# Patient Record
Sex: Male | Born: 1935 | Race: White | Hispanic: No | Smoking: Former smoker
Health system: Southern US, Community
[De-identification: ages and names within clinical notes are randomized; demographics above are authoritative.]

## PROBLEM LIST (undated history)

## (undated) DIAGNOSIS — I471 Supraventricular tachycardia, unspecified: Secondary | ICD-10-CM

## (undated) DIAGNOSIS — T7840XA Allergy, unspecified, initial encounter: Secondary | ICD-10-CM

## (undated) DIAGNOSIS — D510 Vitamin B12 deficiency anemia due to intrinsic factor deficiency: Secondary | ICD-10-CM

## (undated) DIAGNOSIS — C169 Malignant neoplasm of stomach, unspecified: Secondary | ICD-10-CM

## (undated) DIAGNOSIS — H53001 Unspecified amblyopia, right eye: Secondary | ICD-10-CM

## (undated) DIAGNOSIS — M1990 Unspecified osteoarthritis, unspecified site: Secondary | ICD-10-CM

## (undated) DIAGNOSIS — E785 Hyperlipidemia, unspecified: Secondary | ICD-10-CM

## (undated) DIAGNOSIS — Z923 Personal history of irradiation: Secondary | ICD-10-CM

## (undated) DIAGNOSIS — E039 Hypothyroidism, unspecified: Secondary | ICD-10-CM

## (undated) DIAGNOSIS — L309 Dermatitis, unspecified: Secondary | ICD-10-CM

## (undated) HISTORY — DX: Unspecified amblyopia, right eye: H53.001

## (undated) HISTORY — DX: Supraventricular tachycardia: I47.1

## (undated) HISTORY — DX: Supraventricular tachycardia, unspecified: I47.10

## (undated) HISTORY — DX: Hyperlipidemia, unspecified: E78.5

## (undated) HISTORY — DX: Vitamin B12 deficiency anemia due to intrinsic factor deficiency: D51.0

## (undated) HISTORY — DX: Hypothyroidism, unspecified: E03.9

## (undated) HISTORY — DX: Dermatitis, unspecified: L30.9

## (undated) HISTORY — DX: Malignant neoplasm of stomach, unspecified: C16.9

---

## 1979-07-26 HISTORY — PX: HERNIA REPAIR: SHX51

## 1999-12-27 ENCOUNTER — Ambulatory Visit (HOSPITAL_COMMUNITY): Admission: RE | Admit: 1999-12-27 | Discharge: 1999-12-27 | Payer: Self-pay | Admitting: Family Medicine

## 1999-12-27 ENCOUNTER — Encounter: Payer: Self-pay | Admitting: Family Medicine

## 2000-07-17 ENCOUNTER — Ambulatory Visit (HOSPITAL_COMMUNITY): Admission: RE | Admit: 2000-07-17 | Discharge: 2000-07-17 | Payer: Self-pay | Admitting: Gastroenterology

## 2000-07-17 ENCOUNTER — Encounter (INDEPENDENT_AMBULATORY_CARE_PROVIDER_SITE_OTHER): Payer: Self-pay | Admitting: Specialist

## 2002-08-21 ENCOUNTER — Observation Stay (HOSPITAL_COMMUNITY): Admission: RE | Admit: 2002-08-21 | Discharge: 2002-08-22 | Payer: Self-pay

## 2005-06-23 ENCOUNTER — Ambulatory Visit: Payer: Self-pay | Admitting: Hematology & Oncology

## 2005-10-17 ENCOUNTER — Ambulatory Visit: Payer: Self-pay | Admitting: Hematology & Oncology

## 2005-11-24 HISTORY — PX: LAPAROSCOPIC TRANSEXTRAPERITONEAL INGUINAL HERNIA REPAIR: SUR801

## 2006-10-22 ENCOUNTER — Ambulatory Visit (HOSPITAL_COMMUNITY): Admission: RE | Admit: 2006-10-22 | Discharge: 2006-10-22 | Payer: Self-pay | Admitting: Surgery

## 2009-11-24 HISTORY — PX: LAPAROSCOPIC CHOLECYSTECTOMY W/ CHOLANGIOGRAPHY: SUR757

## 2010-04-28 ENCOUNTER — Inpatient Hospital Stay (HOSPITAL_COMMUNITY): Admission: EM | Admit: 2010-04-28 | Discharge: 2010-04-30 | Payer: Self-pay | Admitting: Emergency Medicine

## 2010-04-29 ENCOUNTER — Encounter (INDEPENDENT_AMBULATORY_CARE_PROVIDER_SITE_OTHER): Payer: Self-pay

## 2011-02-10 LAB — COMPREHENSIVE METABOLIC PANEL
ALT: 100 U/L — ABNORMAL HIGH (ref 0–53)
ALT: 119 U/L — ABNORMAL HIGH (ref 0–53)
ALT: 68 U/L — ABNORMAL HIGH (ref 0–53)
AST: 121 U/L — ABNORMAL HIGH (ref 0–37)
AST: 86 U/L — ABNORMAL HIGH (ref 0–37)
AST: 88 U/L — ABNORMAL HIGH (ref 0–37)
Alkaline Phosphatase: 51 U/L (ref 39–117)
Alkaline Phosphatase: 52 U/L (ref 39–117)
Alkaline Phosphatase: 71 U/L (ref 39–117)
BUN: 11 mg/dL (ref 6–23)
CO2: 24 mEq/L (ref 19–32)
CO2: 25 mEq/L (ref 19–32)
Calcium: 8 mg/dL — ABNORMAL LOW (ref 8.4–10.5)
Chloride: 102 mEq/L (ref 96–112)
Creatinine, Ser: 1.04 mg/dL (ref 0.4–1.5)
GFR calc non Af Amer: >60 mL/min (ref >60)
GFR calc non Af Amer: >60 mL/min (ref >60)
Total Bilirubin: 0.9 mg/dL (ref 0.3–1.2)
Total Bilirubin: 0.9 mg/dL (ref 0.3–1.2)
Total Bilirubin: 1.6 mg/dL — ABNORMAL HIGH (ref 0.3–1.2)
Total Protein: 5.7 g/dL — ABNORMAL LOW (ref 6.0–8.3)
Total Protein: 6.9 g/dL (ref 6.0–8.3)

## 2011-02-10 LAB — CBC
HCT: 29.8 % — ABNORMAL LOW (ref 39.0–52.0)
HCT: 30.9 % — ABNORMAL LOW (ref 39.0–52.0)
Hemoglobin: 10.5 g/dL — ABNORMAL LOW (ref 13.0–17.0)
MCHC: 33.2 g/dL (ref 30.0–36.0)
MCV: 92.9 fL (ref 78.0–100.0)
MCV: 94.1 fL (ref 78.0–100.0)
Platelets: 116 10*3/uL — ABNORMAL LOW (ref 150–400)
Platelets: 125 10*3/uL — ABNORMAL LOW (ref 150–400)
Platelets: 173 10*3/uL (ref 150–400)
RBC: 3.3 MIL/uL — ABNORMAL LOW (ref 4.22–5.81)
RDW: 13.6 % (ref 11.5–15.5)
WBC: 7.3 10*3/uL (ref 4.0–10.5)

## 2011-02-10 LAB — POCT I-STAT, CHEM 8
BUN: 17 mg/dL (ref 6–23)
Calcium, Ion: 1.11 mmol/L — ABNORMAL LOW (ref 1.12–1.32)
Glucose, Bld: 140 mg/dL — ABNORMAL HIGH (ref 70–99)
Hemoglobin: 13.3 g/dL (ref 13.0–17.0)
Potassium: 3.6 mEq/L (ref 3.5–5.1)

## 2011-02-10 LAB — URINALYSIS, ROUTINE W REFLEX MICROSCOPIC
Glucose, UA: NEGATIVE mg/dL
Ketones, ur: NEGATIVE mg/dL
Protein, ur: NEGATIVE mg/dL
pH: 8.5 — ABNORMAL HIGH (ref 5.0–8.0)

## 2011-02-10 LAB — DIFFERENTIAL
Basophils Relative: 0 % (ref 0–1)
Eosinophils Absolute: 0.1 10*3/uL (ref 0.0–0.7)
Lymphocytes Relative: 7 % — ABNORMAL LOW (ref 12–46)
Lymphs Abs: 0.8 10*3/uL (ref 0.7–4.0)
Monocytes Relative: 6 % (ref 3–12)

## 2011-02-10 LAB — POCT CARDIAC MARKERS
CKMB, poc: 1.6 ng/mL (ref 1.0–8.0)
Myoglobin, poc: 88.2 ng/mL (ref 12–200)
Troponin i, poc: <0.05 ng/mL (ref 0.00–0.09)

## 2011-04-11 NOTE — Op Note (Signed)
NAMEKIMO, BANCROFT              ACCOUNT NO.:  [id number]   MEDICAL RECORD NO.:  [id number]          PATIENT TYPE:  AMB   LOCATION:  DAY                          FACILITY:  [facility]   PHYSICIAN:  Ardeth Sportsman, MD     DATE OF BIRTH:  May 12, 1930   DATE OF PROCEDURE:  10/22/2006  DATE OF DISCHARGE:                               OPERATIVE REPORT   PRIMARY CARE PHYSICIAN:  Foye Deer, MD   SURGEON:  Ardeth Sportsman, MD   ASSISTANT:  Kendrick Ranch, MD   DIAGNOSIS:  Bilateral inguinal hernias.   POSTOPERATIVE DIAGNOSIS:  Recurrent bilateral inguinal hernias,  indirect.   PROCEDURE PERFORMED:  Laparoscopic bilateral inguinal hernia repair with  mesh.   ANESTHESIA:  1. General anesthesia.  2. Bilateral ilioinguinal and genitofemoral nerve blocks and field      blocks around all port sites.   SPECIMENS:  None.   DRAINS:  None.   ESTIMATED BLOOD LOSS:  Less than 5 mL.   COMPLICATIONS:  None apparent.   INDICATIONS:  Ms. Luster Landsberg is a 77 year old gentleman who has had inguinal  hernia repairs in the past and has evidence of bilateral inguinal  hernias.  The anatomy and embryology of abdominal formation was  explained and pathophysiology of inguinal canal herniation was  explained.  Recommendation was made for inguinal hernia repair.  Options  were discussed and laparoscopic preperitoneal technique was recommended.  Risks of stroke, heart attack, deep venous thrombosis, pulmonary  embolism and death were discussed.  Risks such as bleeding, hematoma,  need for transfusion, wound infection, abscess, urinary retention  requiring catheterization, testicular or vascular injury resulting in  testicular atrophy or loss, hernia recurrence and other risks were  discussed.  Questions were answered and he wished to proceed.   OPERATIVE FINDINGS:  He had a large right indirect inguinal hernia and  evidence of a small recurrent left indirect inguinal hernia.   DESCRIPTION OF PROCEDURE:   Informed consent was confirmed.  The patient  had received preoperative antibiotics and had sequential compressive  devices applied just prior to induction.  He underwent general  anesthesia without difficulty.  He was positioned with both arms tucked.  He had a catheterization done of his bladder and evacuation, since he  had not voided recently; it was an in-and-out cath.  The patient then  was clipped, prepped and draped in a sterile fashion.   Entry was gained into the preperitoneal placed through an infraumbilical  curvilinear incision.  A nick was made in the anterior rectus fascia  just to the right and left of the linea alba.  The rectus abdominis  muscles were elevated bilaterally and the peritoneum was freed off the  anterior abdominal wall along the linea alba using scissors.  A 10-mm  Hasson port was passed behind the left rectus abdominis muscle.  Capno-  preperitoneum was induced to 15 mmHg.  Camera dissection was done to  help free the peritoneum off the anterior abdominal wall such that 5-mm  ports in the right, mid abdomen and the left mid abdomen.   The patient had some  dense peritoneal adhesions on both sides of the  anterior abdominal wall.  Some sharp dissection was done, but ultimately  I was able to free the peritoneum off the anterior abdominal wall in the  right and left lower quadrants.  A small nick was made in the right mid  abdominal peritoneum and this was closed using a 4-0 Vicryl stitch in a  pursestring fashion.  A similar nick was made in the hernia sac on the  left side and this was also closed using a 4-0 Vicryl stitch.   Attention was turned towards the right side.  The peritoneum could  easily be followed and the cord structures going up into a dilated  internal inguinal ring.  The hernia sac was freed off the cord  structures and reduced down into the inguinal canal.  The peritoneum was  peeled off the cord as posteroinferiorly as possible.  A  window was made  between the posterolateral bladder and the posterior pelvic brim near  the area of the obturator foramina.  The peritoneum was freed off  laterally as well.   Dissection was carried down in a mirror-image-type fashion on the left  side.  The adhesions were more dense in the inguinal hernia sac.  The  indirect hernia was smaller.  There was evidence of an old stitch that  was brought down where the prior ligation had been done after reducing  some of the sac in.   A 6 x 6 Parietex mesh was cut in a half-skull shape and placed such that  a medial inferior flap was tucked in the true pelvis between the  posterior pelvic wall and the posterior bladder.  The mesh laid well.  The mesh was tucked posteroinferiorly to the peritoneal reflection.  The  mesh laid well laterally and superiorly such that there were at least 3  inches of coverage done around each internal inguinal ring.  The lead  points to the hernia sacs were grasped bilaterally and elevated cephalad  as capno-preperitoneum was released.  The infraumbilical fascial defect  was closed using 0 Vicryl stitches.  Skin was closed using 4-0 Monocryl  stitches.  A sterile dressing was applied.  The patient was extubated  and sent to the recovery room in stable condition.      Ardeth Sportsman, MD  Electronically Signed     SCG/MEDQ  D:  10/22/2006  T:  10/23/2006  Job:  [id number]

## 2011-04-11 NOTE — Procedures (Signed)
[facility]  Patient:    Kyle Armstrong, Kyle Armstrong                     MRN: [id number] Proc. Date: 07/17/00 Adm. Date:  [id number] Disc. Date: [id number] Attending:  Nelda Marseille CC:         Dellis Anes. Idell Pickles, M.D.                           Procedure Report  PROCEDURE:  Colonoscopy with biopsy.  INDICATIONS:  Polyp on flexible sigmoidoscopy.  INFORMED CONSENT:  Consent was signed after risk, benefits, methods and options were thoroughly discussed long ago in the past and as well before any premedication given.  MEDICINES USED:  Demerol 40 mg, Versed 5 mg.  DESCRIPTION OF PROCEDURE:  Rectal inspection for is pertinent for external hemorrhoids.  Digital examination was negative.  The video colonoscope was inserted.  At the rectosigmoid junction polyp probably seen on sigmoscopy was seen.  Further augmentation was obtained.  We did wash the polyp because it seemed to possibly vegetable matter and did seem to move like it was on a stalk.  We went ahead and advanced colon scope to the cecum which did require abdominal pressure but no position changes.  The cecum was identified by the appendicial orifice and the ileocecal valve.  The prep was adequate.  He did have a fair amount of liquid stool that required lots of washing and suctioning, but on slow withdrawal through the colon, the cecum, ascending, transverse, and descending were normal.  On withdrawal back to the rectum no additional abnormalities were seen.  We continually had to wash and suction. The scope was withdrawal back to the polyp in question, and when we advanced the snare and accidentally bumped into the polyp, it seemed to be dislodged from the mucosa.  There was no blood and no obvious stalk.  We went ahead and suctioned the polypoid into the scope and put it in the pathology container. It floated, which is different than most polyps.  We reinserted the scope. There was no obvious heme margin  or any problem with the wall of the colon. This did look out of the body like vegetable matter.  We elected not to send it to pathology based on not wanting the expense.  The rest of the rectum and sigmoid was reevaluated one more time.  Two tiny hyperplastic-appearing polyps, one in the rectum, one in the distal sigmoid, and were each hot biopsied and put in the same container.  Once back in the rectum, the scope was retroflexed revealing some internal hemorrhoids.  After the air was suctioned and the scope removed we called Dr. Idell Pickles to confirm the appearance and the location, which he agreed upon and we went ahead and took a picture of it and will allow Dr. Idell Pickles to see it, if this is compatible with what he saw and no further work-up.  The patient tolerated the procedure well and no obvious immediate complications.  ENDOSCOPIC DIAGNOSIS: 1. Internal and external hemorrhoids. 2. Two tiny rectosigmoid hyperplastic-appearing polyps, status post hot    biopsy. 3. Questionable polyp versus vegetable matter as we believed in the proximal    rectum.  Discussed with Dr. Idell Pickles.  We will await him to evaluate the    pictures. 4. Otherwise within normal limits to the cecum.  PLAN:  Await pathology but consideration of rechecking his colon in 5-10  years.  Yearly rectals and guaiacs per Dr. Idell Pickles, and as stated above, will await for Dr. Idell Pickles to check the picture and confirm that this is what he was seeing on the flexible sigmoidoscopy and I will be happy to see back sooner or p.r.n. DD:  07/17/00 TD:  07/20/00 Job: [id number]

## 2011-04-11 NOTE — Discharge Summary (Signed)
   NAME:  Kyle Armstrong, Kyle Armstrong                        ACCOUNT NO.:  [id number]   MEDICAL RECORD NO.:  [id number]                   PATIENT TYPE:  INP   LOCATION:  0455                                 FACILITY[facility]  [facility]   PHYSICIAN:  Sherin Quarry, MD                   DATE OF BIRTH:  1930/11/07   DATE OF ADMISSION:  08/21/2002  DATE OF DISCHARGE:  08/22/2002                                 DISCHARGE SUMMARY   REASON FOR ADMISSION:  Otho Najjar. Kyle Armstrong is a gentleman who was admitted for  observation on August 21, 2002, after having presented to the [facility] with a 3-day history of fever associated with redness  of his left leg.   His past history is remarkable for coronary artery disease, sarcoidosis, and  hypothyroidism.   PHYSICAL EXAMINATION:  EXTREMITIES:  Physical exam at the time of admission  is recorded by Dr. Soyla Dryer, and was remarkable for the examination of the  extremities.  These revealed 2+ peripheral pulses.  The patient was noted to  have cellulitic changes in the left big toe, extending up to the dorsum of  the foot and the mid leg with lymphangitic spread at the mid aspect of the  inner thigh.   Dr. Soyla Dryer started the patient on Ancef 1 g q.8h.  This produced a dramatic  improvement over the subsequent 24-hour period.  The cellulitis appeared to  have virtually resolved at the time I saw the patient on Monday.  I  therefore felt that the patient was an appropriate candidate for oral  antibiotic therapy.  On August 22, 2002, the patient was discharged.   DISCHARGE DIAGNOSES:  1. Cellulitis of the left leg, improving.  2. Coronary artery disease.  3. Sarcoidosis.  4. Hypothyroidism.   DISCHARGE MEDICATIONS:  1. Zocor 40 mg q.d.  2. Levoxyl 50 mcg q.d.  3. Lanoxin 0.25 mg q.d.  4. Aspirin 1 q.d.  5. Nasacort AQ 1 puff each nostril q.d.  6. Vitamin B12 once monthly.  7.     Multiple vitamin.  8. Augmentin 875 mg b.i.d. with food to  take for 7 additional days.   FOLLOW UP:  I instructed the patient to follow up with Dr. Idell Pickles on  Thursday or Friday.                                               Sherin Quarry, MD    SY/MEDQ  D:  08/22/2002  T:  08/22/2002  Job:  [id number]   cc:   Raynelle Dick, M.D.  [address]  [place]  [place] [address]  Fax: [phone]

## 2012-06-01 ENCOUNTER — Other Ambulatory Visit: Payer: Self-pay | Admitting: Dermatology

## 2012-06-09 ENCOUNTER — Emergency Department (HOSPITAL_COMMUNITY): Payer: Medicare Other

## 2012-06-09 ENCOUNTER — Emergency Department (HOSPITAL_COMMUNITY)
Admission: EM | Admit: 2012-06-09 | Discharge: 2012-06-09 | Disposition: A | Discharge status: Home / Self Care | Payer: Medicare Other | Attending: Emergency Medicine | Admitting: Emergency Medicine

## 2012-06-09 ENCOUNTER — Encounter (HOSPITAL_COMMUNITY): Payer: Self-pay | Admitting: Emergency Medicine

## 2012-06-09 DIAGNOSIS — Z8739 Personal history of other diseases of the musculoskeletal system and connective tissue: Secondary | ICD-10-CM | POA: Insufficient documentation

## 2012-06-09 DIAGNOSIS — Z7982 Long term (current) use of aspirin: Secondary | ICD-10-CM | POA: Insufficient documentation

## 2012-06-09 DIAGNOSIS — I959 Hypotension, unspecified: Secondary | ICD-10-CM | POA: Insufficient documentation

## 2012-06-09 DIAGNOSIS — I471 Supraventricular tachycardia, unspecified: Secondary | ICD-10-CM | POA: Insufficient documentation

## 2012-06-09 DIAGNOSIS — Z79899 Other long term (current) drug therapy: Secondary | ICD-10-CM | POA: Insufficient documentation

## 2012-06-09 HISTORY — DX: Unspecified osteoarthritis, unspecified site: M19.90

## 2012-06-09 LAB — BASIC METABOLIC PANEL
Calcium: 9.2 mg/dL (ref 8.4–10.5)
GFR calc Af Amer: 90 mL/min — ABNORMAL LOW (ref >90)
GFR calc non Af Amer: 78 mL/min — ABNORMAL LOW (ref >90)
Potassium: 4.3 mEq/L (ref 3.5–5.1)
Sodium: 134 mEq/L — ABNORMAL LOW (ref 135–145)

## 2012-06-09 LAB — CBC
MCHC: 33.6 g/dL (ref 30.0–36.0)
Platelets: 158 10*3/uL (ref 150–400)
RDW: 12.9 % (ref 11.5–15.5)

## 2012-06-09 MED ORDER — SODIUM CHLORIDE 0.9 % IV BOLUS (SEPSIS)
1000.0000 mL | Freq: Once | INTRAVENOUS | Status: AC
  Administered 2012-06-09: 1000 mL via INTRAVENOUS

## 2012-06-09 NOTE — ED Provider Notes (Signed)
History     CSN: [id number]  Arrival date & time 06/09/12  1313   First MD Initiated Contact with Patient 06/09/12 1330      Chief Complaint  Patient presents with  . Hypotension     The history is provided by the patient.   the patient was brought to the emergency department from his primary care physician's office where he is found to be in supraventricular tachycardia with a heart rate in the 160s.  Patient has a history of PSVT and is currently on metoprolol as prescribed by his cardiologist.  He reports in the last 8 months he has had 4 episodes where his heart begins to raise his blood pressure drops.  This morning he took 1-1/2 of his metoprolol as an attempt to try and bring his heart rate down without improvement in his symptoms.  At home his heart rate was in the 160s when he took his metoprolol.  He reports lightheadedness without syncope.  He denies chest pain or chest tightness.  He denies jaw pain shoulder pain or any other anginal symptom.  His had no shortness of breath.  He is otherwise compliant with all of his medications.  He was given nothing on arrival to the emergency department he is back into normal sinus rhythm at this time.  Patient is without complaints would like to go home.  His primary care physician call the cardiology office the cardiology office recommended that the patient be transported to the emergency department  Past Medical History  Diagnosis Date  . Arthritis     Past Surgical History  Procedure Date  . Hernia repair     History reviewed. No pertinent family history.  History  Substance Use Topics  . Smoking status: Never Smoker   . Smokeless tobacco: Not on file  . Alcohol Use: No      Review of Systems  All other systems reviewed and are negative.    Allergies  Review of patient's allergies indicates no known allergies.  Home Medications   Current Outpatient Rx  Name Route Sig Dispense Refill  . ASPIRIN 325 MG PO TBEC Oral  Take 325 mg by mouth every evening.    Marland Kitchen CLOBETASOL PROPIONATE 0.05 % EX OINT Topical Apply 1 application topically 2 (two) times daily. To affected areas on hands    . DIGOXIN 0.25 MG PO TABS Oral Take 250 mcg by mouth daily.    Marland Kitchen FERROUS FUMARATE 325 (106 FE) MG PO TABS Oral Take 1 tablet by mouth 2 (two) times daily.    . OMEGA-3 FATTY ACIDS 1000 MG PO CAPS Oral Take 2 g by mouth daily with lunch.    . IBUPROFEN 200 MG PO TABS Oral Take 200 mg by mouth every 6 (six) hours as needed. For pain    . KETOCONAZOLE 2 % EX CREA Topical Apply 1 application topically daily.    Marland Kitchen LEVOTHYROXINE SODIUM 50 MCG PO TABS Oral Take 50 mcg by mouth every morning.    Marland Kitchen METOPROLOL TARTRATE 25 MG PO TABS Oral Take 25 mg by mouth 2 (two) times daily.    . ADULT MULTIVITAMIN W/MINERALS CH Oral Take 1 tablet by mouth daily.    Marland Kitchen MUPIROCIN 2 % EX OINT Topical Apply 1 application topically 2 (two) times daily. For scratches    . OMEPRAZOLE 20 MG PO CPDR Oral Take 20 mg by mouth daily.    Marland Kitchen RANITIDINE HCL 150 MG PO CAPS Oral Take 150 mg by  mouth 2 (two) times daily.    Marland Kitchen SIMVASTATIN 40 MG PO TABS Oral Take 40 mg by mouth every evening.    . TRIAMCINOLONE ACETONIDE 55 MCG/ACT NA INHA Nasal Place 2 sprays into the nose daily.    . TRIAMCINOLONE ACETONIDE 0.1 % EX CREA Topical Apply 1 application topically 2 (two) times daily. To affected areas on hands    . VITAMIN B-12 100 MCG PO TABS Oral Take 50 mcg by mouth daily.      BP 108/78  Pulse 60  Temp 97.9 F (36.6 C) (Oral)  Resp 20  Ht 5\' 11"  (1.803 m)  Wt 188 lb (85.276 kg)  BMI 26.22 kg/m2  SpO2 98%  Physical Exam  Nursing note and vitals reviewed. Constitutional: He is oriented to person, place, and time. He appears well-developed and well-nourished.  HENT:  Head: Normocephalic and atraumatic.  Eyes: EOM are normal.  Neck: Normal range of motion.  Cardiovascular: Normal rate, regular rhythm, normal heart sounds and intact distal pulses.     Pulmonary/Chest: Effort normal and breath sounds normal. No respiratory distress.  Abdominal: Soft. He exhibits no distension. There is no tenderness.  Musculoskeletal: Normal range of motion.  Neurological: He is alert and oriented to person, place, and time.  Skin: Skin is warm and dry.  Psychiatric: He has a normal mood and affect. Judgment normal.    ED Course  Procedures (including critical care time)   Date: 06/09/2012  Rate: 61  Rhythm: normal sinus rhythm  QRS Axis: normal  Intervals: normal  ST/T Wave abnormalities: normal  Conduction Disutrbances: none  Narrative Interpretation:   Old EKG Reviewed: No significant changes noted     Labs Reviewed  CBC - Abnormal; Notable for the following:    RBC 3.79 (*)     Hemoglobin 11.6 (*)     HCT 34.5 (*)     All other components within normal limits  BASIC METABOLIC PANEL - Abnormal; Notable for the following:    Sodium 134 (*)     Glucose, Bld 116 (*)     GFR calc non Af Amer 78 (*)     GFR calc Af Amer 90 (*)     All other components within normal limits  TROPONIN I   Dg Chest 2 View  06/09/2012  *RADIOLOGY REPORT*  Clinical Data: Hypotension.  CHEST - 2 VIEW  Comparison: 04/28/2010  Findings:  There is hyperinflation of the lungs compatible with COPD.  Chronic interstitial prominence and scarring throughout the lungs.  Heart is normal size.  No effusions or acute bony abnormality.  IMPRESSION: COPD/chronic changes.  No active disease.  Original Report Authenticated By: Cyndie Chime, M.D.    I personally reviewed the imaging tests through PACS system  I reviewed available ER/hospitalization records thought the EMR    1. PSVT (paroxysmal supraventricular tachycardia)       MDM  The patient back in normal sinus rhythm at this time.  His blood pressure is normal.  He seems to be compliant with all his medications.  This seems to be true PSVT.  I discussed his case with his cardiologist Dr. Isabel Caprice who will  followup the patient in the office.  He is also asking the patient be referred to [facility] cardiology for evaluation as he believes the patient may benefit from an ablation and believes consultation by the EP team        Lyanne Co, MD 06/09/12 1527

## 2012-06-09 NOTE — ED Notes (Signed)
Patient's pastor at bedside

## 2012-06-09 NOTE — ED Notes (Signed)
Patient is poor historian.  Patient claims doesn't know his medications.  Thinks he is allergic to one cholesterol medication, but doesn't know which one.  Patient very pleasant.   Patient advises he took 1 1/2 metoprolol 25 mg po this morning to try and bring down his heart rate - which was 160's at home this morning.

## 2012-06-09 NOTE — ED Notes (Signed)
Patient ambulated to restroom independently.  Patient tolerated well.

## 2012-06-09 NOTE — ED Notes (Signed)
Per EMS, patient went to [facility] earlier today and was in a junctional/tachy rhythm with wide QRS complexes, no P wave showing.  EMS advised his BP at [facility] was 80/60.  Patient claims that he took 1 1/2 metoprolol 25 mg po this morning to try and bring his HR down.  Patient claims his HR this morning was 161 at home.   Patient's MD called EMS and had patient transported here.

## 2012-06-20 ENCOUNTER — Other Ambulatory Visit: Payer: Self-pay

## 2012-06-20 ENCOUNTER — Emergency Department (HOSPITAL_COMMUNITY): Payer: Medicare Other

## 2012-06-20 ENCOUNTER — Emergency Department (HOSPITAL_COMMUNITY)
Admission: EM | Admit: 2012-06-20 | Discharge: 2012-06-20 | Disposition: A | Discharge status: Home / Self Care | Payer: Medicare Other | Attending: Emergency Medicine | Admitting: Emergency Medicine

## 2012-06-20 ENCOUNTER — Encounter (HOSPITAL_COMMUNITY): Payer: Self-pay | Admitting: *Deleted

## 2012-06-20 DIAGNOSIS — R0602 Shortness of breath: Secondary | ICD-10-CM | POA: Insufficient documentation

## 2012-06-20 DIAGNOSIS — I471 Supraventricular tachycardia: Secondary | ICD-10-CM

## 2012-06-20 DIAGNOSIS — I517 Cardiomegaly: Secondary | ICD-10-CM | POA: Insufficient documentation

## 2012-06-20 DIAGNOSIS — I498 Other specified cardiac arrhythmias: Secondary | ICD-10-CM | POA: Insufficient documentation

## 2012-06-20 LAB — POCT I-STAT, CHEM 8
HCT: 32 % — ABNORMAL LOW (ref 39.0–52.0)
Hemoglobin: 10.9 g/dL — ABNORMAL LOW (ref 13.0–17.0)
Potassium: 3.7 mEq/L (ref 3.5–5.1)
Sodium: 135 mEq/L (ref 135–145)
TCO2: 22 mmol/L (ref 0–100)

## 2012-06-20 LAB — POCT I-STAT TROPONIN I

## 2012-06-20 MED ORDER — ADENOSINE 6 MG/2ML IV SOLN
INTRAVENOUS | Status: AC
  Administered 2012-06-20: 6 mg via INTRAVENOUS
  Filled 2012-06-20: qty 6

## 2012-06-20 MED ORDER — ADENOSINE 6 MG/2ML IV SOLN
12.0000 mg | Freq: Once | INTRAVENOUS | Status: AC
  Administered 2012-06-20: 6 mg via INTRAVENOUS

## 2012-06-20 NOTE — ED Notes (Signed)
Pt placed on zoll pads, EDP and RES at bedside, second RN at bedside to help with adenisone. 6mg  of adenisone given rapid IVP at 1811, pt converted from HR of 160 to 70s. Pt remained alert and oriented. BP cycling. Pt reports very mild discomfort to chest denies sob nausea or diaphoresis.

## 2012-06-20 NOTE — ED Provider Notes (Signed)
History     CSN: [id number]  Arrival date & time 06/20/12  1740   First MD Initiated Contact with Patient 06/20/12 1759      Chief Complaint  Patient presents with  . Tachycardia    (Consider location/radiation/quality/duration/timing/severity/associated sxs/prior treatment) Patient is a 78 y.o. male presenting with palpitations. The history is provided by the patient.  Palpitations  This is a recurrent problem. The current episode started 1 to 2 hours ago. The problem occurs constantly. The problem has not changed since onset.Associated with: nothing. On average, each episode lasts 2 minutes. Associated symptoms include shortness of breath. Pertinent negatives include no fever, no numbness, no chest pain, no abdominal pain, no nausea, no vomiting, no headaches, no back pain, no weakness and no cough. He has tried nothing for the symptoms. The treatment provided no relief. His past medical history is significant for heart disease.    Past Medical History  Diagnosis Date  . Arthritis   . Irregular heart beat     Past Surgical History  Procedure Date  . Hernia repair     History reviewed. No pertinent family history.  History  Substance Use Topics  . Smoking status: Never Smoker   . Smokeless tobacco: Not on file  . Alcohol Use: No      Review of Systems  Constitutional: Negative for fever, activity change, appetite change and fatigue.  HENT: Negative for congestion, sore throat, facial swelling, rhinorrhea, trouble swallowing, neck pain, neck stiffness, voice change and sinus pressure.   Eyes: Negative.   Respiratory: Positive for shortness of breath. Negative for cough, choking, chest tightness and wheezing.   Cardiovascular: Positive for palpitations. Negative for chest pain.  Gastrointestinal: Negative for nausea, vomiting and abdominal pain.  Genitourinary: Negative for dysuria, urgency, frequency, hematuria, flank pain and difficulty urinating.  Musculoskeletal:  Negative for back pain and gait problem.  Skin: Negative for rash and wound.  Neurological: Negative for facial asymmetry, weakness, numbness and headaches.  Psychiatric/Behavioral: Negative for behavioral problems, confusion and agitation. The patient is not nervous/anxious and is not hyperactive.   All other systems reviewed and are negative.    Allergies  Review of patient's allergies indicates no known allergies.  Home Medications   Current Outpatient Rx  Name Route Sig Dispense Refill  . ASPIRIN 325 MG PO TBEC Oral Take 325 mg by mouth every evening.    Marland Kitchen CLOBETASOL PROPIONATE 0.05 % EX OINT Topical Apply 1 application topically 2 (two) times daily. To affected areas on hands    . DIGOXIN 0.25 MG PO TABS Oral Take 250 mcg by mouth daily.    Marland Kitchen FERROUS FUMARATE 325 (106 FE) MG PO TABS Oral Take 1 tablet by mouth 2 (two) times daily.    . OMEGA-3 FATTY ACIDS 1000 MG PO CAPS Oral Take 2 g by mouth daily with lunch.    . IBUPROFEN 200 MG PO TABS Oral Take 200 mg by mouth every 6 (six) hours as needed. For pain    . KETOCONAZOLE 2 % EX CREA Topical Apply 1 application topically daily.    Marland Kitchen LEVOTHYROXINE SODIUM 50 MCG PO TABS Oral Take 50 mcg by mouth every morning.    Marland Kitchen METOPROLOL TARTRATE 25 MG PO TABS Oral Take 25 mg by mouth 2 (two) times daily.    . ADULT MULTIVITAMIN W/MINERALS CH Oral Take 1 tablet by mouth daily.    Marland Kitchen MUPIROCIN 2 % EX OINT Topical Apply 1 application topically 2 (two) times daily. For  scratches    . OMEPRAZOLE 20 MG PO CPDR Oral Take 20 mg by mouth daily.    Marland Kitchen RANITIDINE HCL 150 MG PO CAPS Oral Take 150 mg by mouth 2 (two) times daily.    Marland Kitchen SIMVASTATIN 40 MG PO TABS Oral Take 40 mg by mouth every evening.    . TRIAMCINOLONE ACETONIDE 55 MCG/ACT NA INHA Nasal Place 2 sprays into the nose daily.    . TRIAMCINOLONE ACETONIDE 0.1 % EX CREA Topical Apply 1 application topically 2 (two) times daily. To affected areas on hands    . VITAMIN B-12 100 MCG PO TABS Oral Take  50 mcg by mouth daily.      BP 103/64  Pulse 64  Temp 97.6 F (36.4 C) (Oral)  Resp 21  Ht 5\' 11"  (1.803 m)  Wt 185 lb (83.915 kg)  BMI 25.80 kg/m2  SpO2 99%  Physical Exam  Nursing note and vitals reviewed. Constitutional: He is oriented to person, place, and time. He appears well-developed and well-nourished. No distress.  HENT:  Head: Normocephalic and atraumatic.  Right Ear: External ear normal.  Left Ear: External ear normal.  Mouth/Throat: No oropharyngeal exudate.  Eyes: Conjunctivae and EOM are normal. Pupils are equal, round, and reactive to light. Right eye exhibits no discharge. Left eye exhibits no discharge.  Neck: Normal range of motion. Neck supple. No JVD present. No tracheal deviation present. No thyromegaly present.  Cardiovascular: Regular rhythm, normal heart sounds and intact distal pulses.  Exam reveals no gallop and no friction rub.   No murmur heard.      Tachycardic   Pulmonary/Chest: Effort normal and breath sounds normal. No respiratory distress. He has no wheezes. He exhibits no tenderness.  Abdominal: Soft. Bowel sounds are normal. He exhibits no distension. There is no tenderness. There is no rebound and no guarding.  Musculoskeletal: Normal range of motion. He exhibits no edema and no tenderness.  Lymphadenopathy:    He has no cervical adenopathy.  Neurological: He is alert and oriented to person, place, and time. No cranial nerve deficit.  Skin: Skin is warm and dry. No rash noted. He is not diaphoretic. No pallor.  Psychiatric: He has a normal mood and affect. His behavior is normal.    ED Course  Procedures (including critical care time)  Labs Reviewed  POCT I-STAT, CHEM 8 - Abnormal; Notable for the following:    Glucose, Bld 125 (*)     Hemoglobin 10.9 (*)     HCT 32.0 (*)     All other components within normal limits  POCT I-STAT TROPONIN I   Dg Chest 2 View  06/20/2012  *RADIOLOGY REPORT*  Clinical Data: Tachycardia.  Nonsmoker.   History of irregular heart beat.  CHEST - 2 VIEW  Comparison: 06/09/2012 and earlier  Findings: The heart size is mildly enlarged.  Coarse parenchymal markings are again noted, slightly increased since prior study. Findings are consistent with interstitial edema or infection.  Mild mid thoracic degenerative changes are seen.  IMPRESSION: Cardiomegaly and mild interstitial edema or infection. Chronic lung changes.  Original Report Authenticated By: Patterson Hammersmith, M.D.     No diagnosis found.    MDM  78 year old male patient past medical history superventricular tachycardia COPD coronary artery disease and diabetes presents with supraventricular tachycardia with heart rate in the 160s. Patient was given 6 mg of adenosine and spontaneously return to a normal sinus rhythm. Patient remained a symptomatically no chest pain and a normal rhythm  for 3 hours in the ED. Patient asymptomatic with normal laboratory workup and chest x-ray. Patient has a cardiologist he'll followup with this week. Patient encouraged to return to the emergency department for shortness of breath chest pain or further palpitations      DG Chest 2 View (Final result)   Result time:06/20/12 1924    Final result by Rad Results In Interface (06/20/12 19:24:45)    Narrative:   *RADIOLOGY REPORT*  Clinical Data: Tachycardia. Nonsmoker. History of irregular heart beat.  CHEST - 2 VIEW  Comparison: 06/09/2012 and earlier  Findings: The heart size is mildly enlarged. Coarse parenchymal markings are again noted, slightly increased since prior study. Findings are consistent with interstitial edema or infection. Mild mid thoracic degenerative changes are seen.  IMPRESSION: Cardiomegaly and mild interstitial edema or infection. Chronic lung changes.  Original Report Authenticated By: Patterson Hammersmith, M.D.    Date: 06/20/2012  Rate: 160  Rhythm: supraventricular tachycardia (SVT)  QRS Axis: normal  Intervals:  normal  ST/T Wave abnormalities: normal  Conduction Disutrbances:none  Narrative Interpretation:   Old EKG Reviewed: changes noted  After adenosine   Date: 06/20/2012  Rate: 62  Rhythm: normal sinus rhythm  QRS Axis: normal  Intervals: normal  ST/T Wave abnormalities: normal  Conduction Disutrbances:none  Narrative Interpretation:   Old EKG Reviewed: changes noted   Case discussed with Dr. Macario Carls, MD 06/20/12 [phone]

## 2012-06-20 NOTE — ED Provider Notes (Signed)
Pt seen on arrival with resident Appears to have SVT He responded to adenosine ( I was present entire time) Will follow closely, stable at this time  Joya Gaskins, MD 06/20/12 1820

## 2012-06-21 ENCOUNTER — Emergency Department (HOSPITAL_COMMUNITY)
Admission: EM | Admit: 2012-06-21 | Discharge: 2012-06-21 | Disposition: A | Discharge status: Home / Self Care | Payer: Medicare Other | Attending: Emergency Medicine | Admitting: Emergency Medicine

## 2012-06-21 ENCOUNTER — Encounter (HOSPITAL_COMMUNITY): Payer: Self-pay | Admitting: *Deleted

## 2012-06-21 DIAGNOSIS — R002 Palpitations: Secondary | ICD-10-CM | POA: Insufficient documentation

## 2012-06-21 DIAGNOSIS — I498 Other specified cardiac arrhythmias: Secondary | ICD-10-CM | POA: Insufficient documentation

## 2012-06-21 DIAGNOSIS — Z79899 Other long term (current) drug therapy: Secondary | ICD-10-CM | POA: Insufficient documentation

## 2012-06-21 DIAGNOSIS — I471 Supraventricular tachycardia: Secondary | ICD-10-CM

## 2012-06-21 MED ORDER — ADENOSINE 6 MG/2ML IV SOLN
INTRAVENOUS | Status: AC
  Administered 2012-06-21: 6 mg
  Filled 2012-06-21: qty 4

## 2012-06-21 NOTE — ED Provider Notes (Signed)
I have personally seen and examined the patient.  I have discussed the plan of care with the resident.  I have reviewed the documentation on PMH/FH/Soc. History.  I have reviewed the documentation of the resident and agree.  I have reviewed and agree with the ECG interpretation(s) documented by the resident.   Joya Gaskins, MD 06/21/12 [phone]

## 2012-06-21 NOTE — ED Notes (Signed)
Pt taken off monitor as MD said pt would be d/c soon.

## 2012-06-21 NOTE — ED Provider Notes (Signed)
History     CSN: [id number]  Arrival date & time 06/21/12  1631   First MD Initiated Contact with Patient 06/21/12 1647      Chief Complaint  Patient presents with  . Tachycardia    (Consider location/radiation/quality/duration/timing/severity/associated sxs/prior treatment) HPI Comments: Pt is an 78 year old man who has had recurrent episodes of SVT.  He was treated for it yesterday with adenosine.  He also had an episode on July 17,2013.  He has been seen for this with Dr. Eldridge Dace, cardiologist.   Patient is a 78 y.o. male presenting with palpitations.  Palpitations  This is a recurrent problem. The problem occurs constantly (Started about 10:30 this AM.  He rested at home, hoping it would resolve.). The problem has not changed since onset.On average, each episode lasts 7 hours. Pertinent negatives include no diaphoresis, no fever, no chest pain, no near-syncope, no weakness and no shortness of breath. He has tried nothing for the symptoms. Past medical history comments: He went to Dr. Hoyle Barr office, was found to have tachycardia, and was sent to [facility] ED for evaluation..    Past Medical History  Diagnosis Date  . Arthritis   . Irregular heart beat     Past Surgical History  Procedure Date  . Hernia repair     History reviewed. No pertinent family history.  History  Substance Use Topics  . Smoking status: Never Smoker   . Smokeless tobacco: Not on file  . Alcohol Use: No      Review of Systems  Constitutional: Negative for fever, chills and diaphoresis.  HENT: Negative.   Eyes: Negative.   Respiratory: Negative.  Negative for shortness of breath.   Cardiovascular: Positive for palpitations. Negative for chest pain and near-syncope.  Gastrointestinal: Negative.   Genitourinary: Negative.   Musculoskeletal: Negative.   Skin: Negative.   Neurological: Negative.  Negative for weakness.  Psychiatric/Behavioral: Negative.     Allergies  Review of  patient's allergies indicates no known allergies.  Home Medications   Current Outpatient Rx  Name Route Sig Dispense Refill  . ASPIRIN 325 MG PO TBEC Oral Take 325 mg by mouth every evening.    Marland Kitchen CLOBETASOL PROPIONATE 0.05 % EX OINT Topical Apply 1 application topically 2 (two) times daily. To affected areas on hands    . DIGOXIN 0.25 MG PO TABS Oral Take 250 mcg by mouth daily.    Marland Kitchen FERROUS FUMARATE 325 (106 FE) MG PO TABS Oral Take 1 tablet by mouth 2 (two) times daily.    . OMEGA-3 FATTY ACIDS 1000 MG PO CAPS Oral Take 2 g by mouth daily with lunch.    . IBUPROFEN 200 MG PO TABS Oral Take 200 mg by mouth every 6 (six) hours as needed. For pain    . KETOCONAZOLE 2 % EX CREA Topical Apply 1 application topically daily.    Marland Kitchen LEVOTHYROXINE SODIUM 50 MCG PO TABS Oral Take 50 mcg by mouth every morning.    Marland Kitchen METOPROLOL TARTRATE 25 MG PO TABS Oral Take 25 mg by mouth 2 (two) times daily.    . ADULT MULTIVITAMIN W/MINERALS CH Oral Take 1 tablet by mouth daily.    Marland Kitchen MUPIROCIN 2 % EX OINT Topical Apply 1 application topically 2 (two) times daily. For scratches    . OMEPRAZOLE 20 MG PO CPDR Oral Take 20 mg by mouth daily.    Marland Kitchen RANITIDINE HCL 150 MG PO CAPS Oral Take 150 mg by mouth 2 (two)  times daily.    Marland Kitchen SIMVASTATIN 40 MG PO TABS Oral Take 40 mg by mouth every evening.    . TRIAMCINOLONE ACETONIDE 55 MCG/ACT NA INHA Nasal Place 2 sprays into the nose daily.    . TRIAMCINOLONE ACETONIDE 0.1 % EX CREA Topical Apply 1 application topically 2 (two) times daily. To affected areas on hands    . VITAMIN B-12 100 MCG PO TABS Oral Take 50 mcg by mouth daily.      BP 100/74  Pulse 164  Resp 20  SpO2 97%  Physical Exam  Nursing note and vitals reviewed. Constitutional: He is oriented to person, place, and time. He appears well-developed and well-nourished. No distress.       He has a resting tachycardia of 160.  HENT:  Head: Normocephalic and atraumatic.  Right Ear: External ear normal.  Left  Ear: External ear normal.  Mouth/Throat: Oropharynx is clear and moist.  Eyes: Conjunctivae and EOM are normal. Pupils are equal, round, and reactive to light.  Neck: Normal range of motion. Neck supple.  Cardiovascular: Regular rhythm and normal heart sounds.        Tachycardia of 160.  Pulmonary/Chest: Effort normal and breath sounds normal. He has no wheezes. He has no rales.  Abdominal: Soft. Bowel sounds are normal.  Musculoskeletal: Normal range of motion. He exhibits no edema and no tenderness.  Neurological: He is alert and oriented to person, place, and time.       NO sensory or motor deficit.  Skin: Skin is warm and dry.  Psychiatric: He has a normal mood and affect. His behavior is normal.    ED Course  Procedures (including critical care time)  Labs Reviewed - No data to display Dg Chest 2 View  06/20/2012  *RADIOLOGY REPORT*  Clinical Data: Tachycardia.  Nonsmoker.  History of irregular heart beat.  CHEST - 2 VIEW  Comparison: 06/09/2012 and earlier  Findings: The heart size is mildly enlarged.  Coarse parenchymal markings are again noted, slightly increased since prior study. Findings are consistent with interstitial edema or infection.  Mild mid thoracic degenerative changes are seen.  IMPRESSION: Cardiomegaly and mild interstitial edema or infection. Chronic lung changes.  Original Report Authenticated By: Patterson Hammersmith, M.D.    Date: 06/21/2012  Rate: 160  Rhythm: supraventricular tachycardia (SVT)  QRS Axis: left  Intervals: QT prolonged  ST/T Wave abnormalities: ST depressions anteriorly  Conduction Disutrbances:left anterior fascicular block  Narrative Interpretation: Abnormal EKG  Old EKG Reviewed: unchanged--seen here with SVT yesterday.  5:15 PM  Date: 06/21/2012  Rate: 69  Rhythm: normal sinus rhythm  QRS Axis: left  Intervals: normal  ST/T Wave abnormalities: normal  Conduction Disutrbances:none  Narrative Interpretation: Normal EKG  Old EKG  Reviewed: changes noted--has been converted from SVT.  5:18 PM Pt given Adenosine 6 mg IV with conversion to NSR.  Case discussed with Dr. Eldridge Dace --> increase metoprolol to 50 mg bid, and his office will set pt up to be seen by an electrophysiologist to consider ablation.  6:14 PM No further tachycardia.  Released.   1. Supraventricular tachycardia            Carleene Cooper III, MD 06/21/12 1815

## 2012-06-21 NOTE — ED Notes (Signed)
Pt brought immediately back to room and placed on zoll and ekg and vitals performed. Dr Ignacia Palma made aware. Pt is aware of poc. IV established. Nursing staff for section at bedside.

## 2012-06-21 NOTE — ED Notes (Addendum)
Pt with hx of svt. Pt with HR at 160s. Pt was at work and started to feel bad, states he is starting to recognize the feeling. Pt went to doctors office and drove himself here. Pt is alert and oriented x 3. Pt was here for same yesterday and given adenosine.

## 2012-06-21 NOTE — ED Notes (Signed)
AIDET performed. MD with pt.

## 2012-06-23 ENCOUNTER — Ambulatory Visit (INDEPENDENT_AMBULATORY_CARE_PROVIDER_SITE_OTHER): Payer: Medicare Other | Admitting: Internal Medicine

## 2012-06-23 ENCOUNTER — Encounter: Payer: Self-pay | Admitting: Internal Medicine

## 2012-06-23 ENCOUNTER — Encounter: Payer: Self-pay | Admitting: *Deleted

## 2012-06-23 VITALS — BP 118/62 | HR 79 | Resp 18 | Ht 67.0 in | Wt 188.8 lb

## 2012-06-23 DIAGNOSIS — I498 Other specified cardiac arrhythmias: Secondary | ICD-10-CM

## 2012-06-23 DIAGNOSIS — I471 Supraventricular tachycardia: Secondary | ICD-10-CM

## 2012-06-23 NOTE — Patient Instructions (Addendum)

## 2012-06-23 NOTE — Assessment & Plan Note (Addendum)
I have reviewed the patient's recent ER ekgs which reveal short RP narrow complex SVT.  His tachycardia terminates with adenosine.  He has failed medical therapy with metoprolol and digoxin.  Given his recent frequent episodes, I think that catheter ablation is a prudent option. Therapeutic strategies for supraventricular tachycardia including medicine and ablation were discussed in detail with the patient today. Risk, benefits, and alternatives to EP study and radiofrequency ablation were also discussed in detail today. These risks include but are not limited to stroke, bleeding, vascular damage, tamponade, perforation, damage to the heart and other structures, AV block requiring pacemaker, worsening renal function, and death.  Given evidence of AV nodal disease by ekg, his risks for AV block are likely increased.  The patient understands these risk and wishes to proceed.  We will therefore proceed with catheter ablation at the next available time.

## 2012-06-23 NOTE — Progress Notes (Signed)
 Primary Care Physician: ROSS,CHARLES ALAN, MD Referring Physician:  Dr Varanasi   Kyle Armstrong is a 78 y.o. male with a h/o recently diagnosed adenosine sensitive short RP tachycardia who presents today for EP consultation.  He reports abrupt onset/ offset tachypalpitations for about 3 years.  He is unaware of triggers/ precipitants of these episodes.  He reports a feeling of fullness in his throat as well as mild dizziness with episodes.  He has failed medical therapy with metoprolol and digoxin.  Episodes have recently increased in frequency and duration.  He has require visits to [facility] 7/17, 7/28, and 7/29 during which he received adenosine for tachycardia termination.  He has not previously tried vagal maneuvers.  Today, he denies symptoms of palpitations, chest pain, shortness of breath, orthopnea, PND, lower extremity edema, dizziness, presyncope, syncope, or neurologic sequela. The patient is tolerating medications without difficulties and is otherwise without complaint today.   Past Medical History  Diagnosis Date  . Arthritis   . SVT (supraventricular tachycardia)     adenosine sensitive short RP tachycardia  . Hypothyroidism   . Hyperlipidemia   . Pernicious anemia    Past Surgical History  Procedure Date  . Hernia repair     Current Outpatient Prescriptions  Medication Sig Dispense Refill  . aspirin 325 MG EC tablet Take 325 mg by mouth every evening.      . clobetasol ointment (TEMOVATE) 0.05 % Apply 1 application topically 2 (two) times daily. To affected areas on hands      . digoxin (LANOXIN) 0.25 MG tablet Take 250 mcg by mouth daily.      . ferrous fumarate (HEMOCYTE - 106 MG FE) 325 (106 FE) MG TABS Take 1 tablet by mouth 2 (two) times daily.      . fesoterodine (TOVIAZ) 4 MG TB24 Take 4 mg by mouth daily.      . fish oil-omega-3 fatty acids 1000 MG capsule Take 2 g by mouth daily with lunch.      . ibuprofen (ADVIL,MOTRIN) 200 MG tablet Take 200 mg by  mouth every 6 (six) hours as needed. For pain      . ketoconazole (NIZORAL) 2 % cream Apply 1 application topically daily.      . levothyroxine (SYNTHROID, LEVOTHROID) 50 MCG tablet Take 50 mcg by mouth every morning.      . metoprolol tartrate (LOPRESSOR) 25 MG tablet Take 25 mg by mouth 2 (two) times daily.      . Multiple Vitamin (MULTIVITAMIN WITH MINERALS) TABS Take 1 tablet by mouth daily.      . mupirocin ointment (BACTROBAN) 2 % Apply 1 application topically 2 (two) times daily. For scratches      . ranitidine (ZANTAC) 150 MG capsule Take 150 mg by mouth 2 (two) times daily.      . simvastatin (ZOCOR) 40 MG tablet Take 40 mg by mouth every evening.      . triamcinolone (NASACORT) 55 MCG/ACT nasal inhaler Place 2 sprays into the nose daily.      . triamcinolone cream (KENALOG) 0.1 % Apply 1 application topically 2 (two) times daily. To affected areas on hands      . vitamin B-12 (CYANOCOBALAMIN) 100 MCG tablet Take 50 mcg by mouth daily.        No Known Allergies  History   Social History  . Marital Status: Married    Spouse Name: N/A    Number of Children: N/A  . Years of   Education: N/A   Occupational History  . Not on file.   Social History Main Topics  . Smoking status: Never Smoker   . Smokeless tobacco: Not on file  . Alcohol Use: No  . Drug Use: No  . Sexually Active: No   Other Topics Concern  . Not on file   Social History Narrative   Widowed,  retired    Family History  Problem Relation Age of Onset  . Hypertension      ROS- All systems are reviewed and negative except as per the HPI above  Physical Exam: Filed Vitals:   06/23/12 1636  BP: 118/62  Pulse: 79  Resp: 18  Height: 5' 7" (1.702 m)  Weight: 188 lb 12.8 oz (85.639 kg)  SpO2: 92%    GEN- The patient is well appearing, alert and oriented x 3 today.   Head- normocephalic, atraumatic Eyes-  Sclera clear, conjunctiva pink Ears- hearing intact Oropharynx- clear Neck- supple, no  JVP Lymph- no cervical lymphadenopathy Lungs- Clear to ausculation bilaterally, normal work of breathing Heart- Regular rate and rhythm, no murmurs, rubs or gallops, PMI not laterally displaced GI- soft, NT, ND, + BS Extremities- no clubbing, cyanosis, or edema MS- no significant deformity or atrophy Skin- no rash or lesion Psych- euthymic mood, full affect Neuro- strength and sensation are intact  EKG today reveals sinus rhythm 51 bpm, PR 180, RBBB, LAHB, QRS 116 msec  Assessment and Plan:  

## 2012-07-01 ENCOUNTER — Encounter (HOSPITAL_COMMUNITY): Payer: Self-pay | Admitting: Pharmacy Technician

## 2012-07-09 ENCOUNTER — Other Ambulatory Visit (INDEPENDENT_AMBULATORY_CARE_PROVIDER_SITE_OTHER): Payer: Medicare Other

## 2012-07-09 DIAGNOSIS — I471 Supraventricular tachycardia: Secondary | ICD-10-CM

## 2012-07-09 DIAGNOSIS — I498 Other specified cardiac arrhythmias: Secondary | ICD-10-CM

## 2012-07-09 LAB — CBC WITH DIFFERENTIAL/PLATELET
Basophils Absolute: 0 10*3/uL (ref 0.0–0.1)
Eosinophils Relative: 6.4 % — ABNORMAL HIGH (ref 0.0–5.0)
MCV: 93.4 fl (ref 78.0–100.0)
Monocytes Absolute: 0.4 10*3/uL (ref 0.1–1.0)
Neutrophils Relative %: 71.1 % (ref 43.0–77.0)
Platelets: 181 10*3/uL (ref 150.0–400.0)
RDW: 13.9 % (ref 11.5–14.6)
WBC: 7 10*3/uL (ref 4.5–10.5)

## 2012-07-09 LAB — BASIC METABOLIC PANEL
Calcium: 9.4 mg/dL (ref 8.4–10.5)
GFR: 78.16 mL/min (ref >60.00)
Glucose, Bld: 154 mg/dL — ABNORMAL HIGH (ref 70–99)
Potassium: 4.6 mEq/L (ref 3.5–5.1)
Sodium: 137 mEq/L (ref 135–145)

## 2012-07-16 ENCOUNTER — Encounter (HOSPITAL_COMMUNITY): Payer: Self-pay | Admitting: *Deleted

## 2012-07-16 ENCOUNTER — Ambulatory Visit (HOSPITAL_COMMUNITY)
Admission: RE | Admit: 2012-07-16 | Discharge: 2012-07-17 | Disposition: A | Discharge status: Home / Self Care | Payer: Medicare Other | Source: Ambulatory Visit | Attending: Internal Medicine | Admitting: Internal Medicine

## 2012-07-16 ENCOUNTER — Encounter (HOSPITAL_COMMUNITY)
Admission: RE | Disposition: A | Discharge status: Home / Self Care | Payer: Self-pay | Source: Ambulatory Visit | Attending: Internal Medicine

## 2012-07-16 DIAGNOSIS — I471 Supraventricular tachycardia, unspecified: Secondary | ICD-10-CM | POA: Diagnosis present

## 2012-07-16 DIAGNOSIS — D510 Vitamin B12 deficiency anemia due to intrinsic factor deficiency: Secondary | ICD-10-CM | POA: Diagnosis present

## 2012-07-16 DIAGNOSIS — I498 Other specified cardiac arrhythmias: Secondary | ICD-10-CM | POA: Insufficient documentation

## 2012-07-16 DIAGNOSIS — E785 Hyperlipidemia, unspecified: Secondary | ICD-10-CM | POA: Diagnosis present

## 2012-07-16 DIAGNOSIS — Z79899 Other long term (current) drug therapy: Secondary | ICD-10-CM | POA: Insufficient documentation

## 2012-07-16 DIAGNOSIS — Z7982 Long term (current) use of aspirin: Secondary | ICD-10-CM | POA: Insufficient documentation

## 2012-07-16 DIAGNOSIS — E039 Hypothyroidism, unspecified: Secondary | ICD-10-CM | POA: Diagnosis present

## 2012-07-16 DIAGNOSIS — Z7902 Long term (current) use of antithrombotics/antiplatelets: Secondary | ICD-10-CM | POA: Insufficient documentation

## 2012-07-16 DIAGNOSIS — M1990 Unspecified osteoarthritis, unspecified site: Secondary | ICD-10-CM | POA: Diagnosis present

## 2012-07-16 HISTORY — PX: OTHER SURGICAL HISTORY: SHX169

## 2012-07-16 HISTORY — PX: SUPRAVENTRICULAR TACHYCARDIA ABLATION: SHX5492

## 2012-07-16 SURGERY — SUPRAVENTRICULAR TACHYCARDIA ABLATION
Anesthesia: Moderate Sedation | Laterality: Bilateral

## 2012-07-16 MED ORDER — SODIUM CHLORIDE 0.9 % IJ SOLN: 3.0000 mL | INTRAMUSCULAR | Status: DC | PRN

## 2012-07-16 MED ORDER — BUPIVACAINE HCL (PF) 0.25 % IJ SOLN
INTRAMUSCULAR | Status: AC
  Filled 2012-07-16: qty 30

## 2012-07-16 MED ORDER — ADULT MULTIVITAMIN W/MINERALS CH
1.0000 | ORAL_TABLET | Freq: Every day | ORAL | Status: DC
  Administered 2012-07-16 – 2012-07-17 (×2): 1 via ORAL
  Filled 2012-07-16 (×3): qty 1

## 2012-07-16 MED ORDER — HYDROCODONE-ACETAMINOPHEN 5-325 MG PO TABS: 1.0000 | ORAL_TABLET | ORAL | Status: DC | PRN

## 2012-07-16 MED ORDER — MIDAZOLAM HCL 5 MG/5ML IJ SOLN
INTRAMUSCULAR | Status: AC
  Filled 2012-07-16: qty 5

## 2012-07-16 MED ORDER — ACETAMINOPHEN 325 MG PO TABS
650.0000 mg | ORAL_TABLET | ORAL | Status: DC | PRN
  Administered 2012-07-16 – 2012-07-17 (×2): 650 mg via ORAL
  Filled 2012-07-16 (×2): qty 2

## 2012-07-16 MED ORDER — SODIUM CHLORIDE 0.9 % IJ SOLN
3.0000 mL | Freq: Two times a day (BID) | INTRAMUSCULAR | Status: DC
  Administered 2012-07-16 – 2012-07-17 (×2): 3 mL via INTRAVENOUS

## 2012-07-16 MED ORDER — FENTANYL CITRATE 0.05 MG/ML IJ SOLN
INTRAMUSCULAR | Status: AC
  Filled 2012-07-16: qty 2

## 2012-07-16 MED ORDER — SODIUM CHLORIDE 0.9 % IV SOLN: 250.0000 mL | INTRAVENOUS | Status: DC | PRN

## 2012-07-16 MED ORDER — FERROUS FUMARATE 325 (106 FE) MG PO TABS
1.0000 | ORAL_TABLET | Freq: Two times a day (BID) | ORAL | Status: DC
  Administered 2012-07-16 – 2012-07-17 (×2): 106 mg via ORAL
  Filled 2012-07-16 (×3): qty 1

## 2012-07-16 MED ORDER — ONDANSETRON HCL 4 MG/2ML IJ SOLN: 4.0000 mg | Freq: Four times a day (QID) | INTRAMUSCULAR | Status: DC | PRN

## 2012-07-16 MED ORDER — SODIUM CHLORIDE 0.9 % IV SOLN
INTRAVENOUS | Status: DC
  Administered 2012-07-16: 20 mL/h via INTRAVENOUS

## 2012-07-16 MED ORDER — LEVOTHYROXINE SODIUM 50 MCG PO TABS
50.0000 ug | ORAL_TABLET | Freq: Every day | ORAL | Status: DC
  Administered 2012-07-17: 50 ug via ORAL
  Filled 2012-07-16 (×2): qty 1

## 2012-07-16 MED ORDER — HYDROXYUREA 500 MG PO CAPS
ORAL_CAPSULE | ORAL | Status: AC
  Filled 2012-07-16: qty 1

## 2012-07-16 NOTE — Brief Op Note (Signed)
07/16/2012  3:28 PM  PATIENT:  Kyle Armstrong  78 y.o. male  PRE-OPERATIVE DIAGNOSIS:  svt  POST-OPERATIVE DIAGNOSIS:  AVNRT  PROCEDURE:  Procedure(s) (LRB): SUPRAVENTRICULAR TACHYCARDIA ABLATION (Bilateral)  SURGEON:  Surgeon(s) and Role:    * Hillis Range, MD - Primary  PHYSICIAN ASSISTANT:   ASSISTANTS: none   ANESTHESIA:   IV sedation  EBL:     BLOOD ADMINISTERED:none  DRAINS: none   LOCAL MEDICATIONS USED:  LIDOCAINE   SPECIMEN:  No Specimen  DISPOSITION OF SPECIMEN:  N/A  COUNTS:  YES  TOURNIQUET:  * No tourniquets in log *  DICTATION: .Other Dictation: Dictation Number [id number]  PLAN OF CARE: Admit for overnight observation  PATIENT DISPOSITION:  PACU - hemodynamically stable.   Delay start of Pharmacological VTE agent (>24hrs) due to surgical blood loss or risk of bleeding: yes

## 2012-07-16 NOTE — Interval H&P Note (Signed)
History and Physical Interval Note:  07/16/2012 12:42 PM  Kyle Armstrong  has presented today for surgery, with the diagnosis of svt  The various methods of treatment have been discussed with the patient and family. After consideration of risks, benefits and other options for treatment, the patient has consented to  Procedure(s) (LRB): SUPRAVENTRICULAR TACHYCARDIA ABLATION (Bilateral) as a surgical intervention .  The patient's history has been reviewed, patient examined, no change in status, stable for surgery.  I have reviewed the patient's chart and labs.  Questions were answered to the patient's satisfaction.     Hillis Range

## 2012-07-16 NOTE — H&P (View-Only) (Signed)
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 male with a h/o recently diagnosed adenosine sensitive short RP tachycardia who presents today for EP consultation.  He reports abrupt onset/ offset tachypalpitations for about 3 years.  He is unaware of triggers/ precipitants of these episodes.  He reports a feeling of fullness in his throat as well as mild dizziness with episodes.  He has failed medical therapy with metoprolol and digoxin.  Episodes have recently increased in frequency and duration.  He has require visits to [facility] ER 7/17, 7/28, and 7/29 during which he received adenosine for tachycardia termination.  He has not previously tried vagal maneuvers.  Today, he denies symptoms of palpitations, chest pain, shortness of breath, orthopnea, PND, lower extremity edema, dizziness, presyncope, syncope, or neurologic sequela. The patient is tolerating medications without difficulties and is otherwise without complaint today.   Past Medical History  Diagnosis Date  . Arthritis   . SVT (supraventricular tachycardia)     adenosine sensitive short RP tachycardia  . Hypothyroidism   . Hyperlipidemia   . Pernicious anemia    Past Surgical History  Procedure Date  . Hernia repair     Current Outpatient Prescriptions  Medication Sig Dispense Refill  . aspirin 325 MG EC tablet Take 325 mg by mouth every evening.      . clobetasol ointment (TEMOVATE) 0.05 % Apply 1 application topically 2 (two) times daily. To affected areas on hands      . digoxin (LANOXIN) 0.25 MG tablet Take 250 mcg by mouth daily.      . ferrous fumarate (HEMOCYTE - 106 MG FE) 325 (106 FE) MG TABS Take 1 tablet by mouth 2 (two) times daily.      . fesoterodine (TOVIAZ) 4 MG TB24 Take 4 mg by mouth daily.      . fish oil-omega-3 fatty acids 1000 MG capsule Take 2 g by mouth daily with lunch.      . ibuprofen (ADVIL,MOTRIN) 200 MG tablet Take 200 mg by  mouth every 6 (six) hours as needed. For pain      . ketoconazole (NIZORAL) 2 % cream Apply 1 application topically daily.      Marland Kitchen levothyroxine (SYNTHROID, LEVOTHROID) 50 MCG tablet Take 50 mcg by mouth every morning.      . metoprolol tartrate (LOPRESSOR) 25 MG tablet Take 25 mg by mouth 2 (two) times daily.      . Multiple Vitamin (MULTIVITAMIN WITH MINERALS) TABS Take 1 tablet by mouth daily.      . mupirocin ointment (BACTROBAN) 2 % Apply 1 application topically 2 (two) times daily. For scratches      . ranitidine (ZANTAC) 150 MG capsule Take 150 mg by mouth 2 (two) times daily.      . simvastatin (ZOCOR) 40 MG tablet Take 40 mg by mouth every evening.      . triamcinolone (NASACORT) 55 MCG/ACT nasal inhaler Place 2 sprays into the nose daily.      Marland Kitchen triamcinolone cream (KENALOG) 0.1 % Apply 1 application topically 2 (two) times daily. To affected areas on hands      . vitamin B-12 (CYANOCOBALAMIN) 100 MCG tablet Take 50 mcg by mouth daily.        No Known Allergies  History   Social History  . Marital Status: Married    Spouse Name: N/A    Number of Children: N/A  . Years of  Education: N/A   Occupational History  . Not on file.   Social History Main Topics  . Smoking status: Never Smoker   . Smokeless tobacco: Not on file  . Alcohol Use: No  . Drug Use: No  . Sexually Active: No   Other Topics Concern  . Not on file   Social History Narrative   Widowed,  retired    Family History  Problem Relation Age of Onset  . Hypertension      ROS- All systems are reviewed and negative except as per the HPI above  Physical Exam: Filed Vitals:   06/23/12 1636  BP: 118/62  Pulse: 79  Resp: 18  Height: 5\' 7"  (1.702 m)  Weight: 188 lb 12.8 oz (85.639 kg)  SpO2: 92%    GEN- The patient is well appearing, alert and oriented x 3 today.   Head- normocephalic, atraumatic Eyes-  Sclera clear, conjunctiva pink Ears- hearing intact Oropharynx- clear Neck- supple, no  JVP Lymph- no cervical lymphadenopathy Lungs- Clear to ausculation bilaterally, normal work of breathing Heart- Regular rate and rhythm, no murmurs, rubs or gallops, PMI not laterally displaced GI- soft, NT, ND, + BS Extremities- no clubbing, cyanosis, or edema MS- no significant deformity or atrophy Skin- no rash or lesion Psych- euthymic mood, full affect Neuro- strength and sensation are intact  EKG today reveals sinus rhythm 51 bpm, PR 180, RBBB, LAHB, QRS 116 msec  Assessment and Plan:

## 2012-07-16 NOTE — Progress Notes (Signed)
Doing well s/p ablation for AVNRT  I would anticipate discharge in early am.  Stop digoxin. Stop metoprolol  Follow-up with me in 4 weeks

## 2012-07-17 ENCOUNTER — Encounter (HOSPITAL_COMMUNITY): Payer: Self-pay | Admitting: Nurse Practitioner

## 2012-07-17 DIAGNOSIS — E039 Hypothyroidism, unspecified: Secondary | ICD-10-CM | POA: Diagnosis present

## 2012-07-17 DIAGNOSIS — M1990 Unspecified osteoarthritis, unspecified site: Secondary | ICD-10-CM | POA: Diagnosis present

## 2012-07-17 DIAGNOSIS — D510 Vitamin B12 deficiency anemia due to intrinsic factor deficiency: Secondary | ICD-10-CM | POA: Diagnosis present

## 2012-07-17 DIAGNOSIS — I471 Supraventricular tachycardia: Secondary | ICD-10-CM

## 2012-07-17 DIAGNOSIS — E785 Hyperlipidemia, unspecified: Secondary | ICD-10-CM | POA: Diagnosis present

## 2012-07-17 NOTE — Discharge Summary (Signed)
Patient ID: Kyle Armstrong,  MRN: [id number], DOB/AGE: 12-01-1929 78 y.o.  Admit date: 07/16/2012 Discharge date: 07/17/2012  Primary Care Provider: Daisy Floro Primary Cardiologist: J. Allred, MD  Discharge Diagnoses Principal Problem:  *SVT (supraventricular tachycardia)  **s/p RFCA this admission.  Active Problems:  Hyperlipidemia  Pernicious anemia  Hypothyroidism  Arthritis  Allergies Allergies  Allergen Reactions  . Lipitor (Atorvastatin) Other (See Comments)    Leg cramps   Procedures  EP Study and Radiofrequency Catheter Ablation  1. Comprehensive EP study. 2. Coronary sinus pacing and recording. 3. Mapping of supraventricular tachycardia. 4. Ablation of supraventricular tachycardia. 5. Arrhythmia induction with pacing and isoproterenol infusion.  CONCLUSIONS: 1. Sinus rhythm upon presentation. 2. The patient has dual AV nodal physiology with easily inducible and     incessant classic AV nodal reentrant tachycardia. 3. Successful ablation of the slow AV nodal pathway with no inducible     arrhythmias following ablation both on and off of isoproterenol 4. No early apparent complications. _____________  History of Present Illness  78 y/o male with the above problem list.  He was recently seen in clinic by Dr. Johney Frame secondary to increasing frequency and duration of SVT requiring 3 separate ER visits where his SVT was terminated with Adenosine.  It was felt that pt would be a good candidate for SVT ablation and pt was agreeable.  Hospital Course  Pt presented to [facility] EP lab on 8/23.  He underwent EP study and subsequent successful SVT ablation.  He tolerated this procedure well and post-procedure has been ambulating without difficulty.  He will be discharged home today in good condition.  Discharge Vitals Blood pressure 121/76, pulse 63, temperature 98.1 F (36.7 C), temperature source Oral, resp. rate 18, height 5\' 11"  (1.803 m), weight 185 lb  (83.915 kg), SpO2 96.00%.  Filed Weights   07/16/12 1052  Weight: 185 lb (83.915 kg)   Labs  None  Disposition  Pt is being discharged home today in good condition.  Follow-up Plans & Appointments  Follow-up Information    Follow up with Hillis Range, MD in 4 weeks. (we will arrange.)    Contact information:   [address], [address] [place] [address] [phone]        Discharge Medications  Medication List  As of 07/17/2012 12:34 PM   STOP taking these medications         digoxin 0.25 MG tablet      metoprolol 50 MG tablet         TAKE these medications         aspirin 325 MG EC tablet   Take 325 mg by mouth every evening.      clobetasol ointment 0.05 %   Commonly known as: TEMOVATE   Apply 1 application topically 2 (two) times daily. To affected areas on hands      ferrous fumarate 325 (106 FE) MG Tabs   Commonly known as: HEMOCYTE - 106 mg FE   Take 1 tablet by mouth 2 (two) times daily.      fish oil-omega-3 fatty acids 1000 MG capsule   Take 2 g by mouth daily with lunch.      ibuprofen 200 MG tablet   Commonly known as: ADVIL,MOTRIN   Take 200 mg by mouth every 6 (six) hours as needed. For pain      levothyroxine 50 MCG tablet   Commonly known as: SYNTHROID, LEVOTHROID   Take 50 mcg by mouth  every morning.      multivitamin with minerals Tabs   Take 1 tablet by mouth daily.      ranitidine 150 MG capsule   Commonly known as: ZANTAC   Take 150 mg by mouth 2 (two) times daily.      simvastatin 40 MG tablet   Commonly known as: ZOCOR   Take 40 mg by mouth every evening.      Super B Complex/Vitamin C Tabs   Take 1 tablet by mouth daily.      triamcinolone 55 MCG/ACT nasal inhaler   Commonly known as: NASACORT   Place 2 sprays into the nose daily.      triamcinolone cream 0.1 %   Commonly known as: KENALOG   Apply 1 application topically 2 (two) times daily. To affected areas on hands      vitamin B-12 100 MCG  tablet   Commonly known as: CYANOCOBALAMIN   Take 50 mcg by mouth daily.          Outstanding Labs/Studies  None  Duration of Discharge Encounter   Greater than 30 minutes including physician time.  Signed, Nicolasa Ducking NP 07/17/2012, 12:34 PM

## 2012-07-17 NOTE — Progress Notes (Signed)
   Subjective:  Feels very well after ablation of SVT yesterday. Telemetry stable NSR. EKG shows normal PR interval.  Objective:  Vital Signs in the last 24 hours: Temp:  [97.6 F (36.4 C)-98.3 F (36.8 C)] 98.1 F (36.7 C) (08/24 0500) Pulse Rate:  [62-73] 63  (08/24 0500) Resp:  [18-20] 18  (08/24 0500) BP: (115-164)/(58-77) 121/76 mmHg (08/24 0500) SpO2:  [95 %-97 %] 96 % (08/24 0500)  Intake/Output from previous day: 08/23 0701 - 08/24 0700 In: 360 [P.O.:360] Out: 320 [Urine:320] Intake/Output from this shift:       . bupivacaine      . bupivacaine      . fentaNYL      . ferrous fumarate  1 tablet Oral BID  . hydroxyurea      . levothyroxine  50 mcg Oral Q breakfast  . midazolam      . multivitamin with minerals  1 tablet Oral Daily  . sodium chloride  3 mL Intravenous Q12H      . DISCONTD: sodium chloride 20 mL/hr (07/16/12 1101)    Physical Exam: The patient appears to be in no distress.  Head and neck exam reveals that the pupils are equal and reactive.  The extraocular movements are full.  There is no scleral icterus.  Mouth and pharynx are benign.  No lymphadenopathy.  No carotid bruits.  The jugular venous pressure is normal.  Thyroid is not enlarged or tender. IJ looks fine. Chest is clear to percussion and auscultation.  No rales or rhonchi.  Expansion of the chest is symmetrical.  Heart reveals no abnormal lift or heave.  First and second heart sounds are normal.  There is no murmur gallop rub or click.  The abdomen is soft and nontender.  Bowel sounds are normoactive.  There is no hepatosplenomegaly or mass.  There are no abdominal bruits. Groin OKay  Extremities reveal no phlebitis or edema.  Pedal pulses are good.  There is no cyanosis or clubbing.  Neurologic exam is normal strength and no lateralizing weakness.  No sensory deficits.  Integument reveals no rash  Lab Results: No results found for this basename: WBC:2,HGB:2,PLT:2 in the last 72  hours No results found for this basename: NA:2,K:2,CL:2,CO2:2,GLUCOSE:2,BUN:2,CREATININE:2 in the last 72 hours No results found for this basename: TROPONINI:2,CK,MB:2 in the last 72 hours Hepatic Function Panel No results found for this basename: PROT,ALBUMIN,AST,ALT,ALKPHOS,BILITOT,BILIDIR,IBILI in the last 72 hours No results found for this basename: CHOL in the last 72 hours No results found for this basename: PROTIME in the last 72 hours  Imaging: Imaging results have been reviewed  Cardiac Studies:  Assessment/Plan:  Patient Active Hospital Problem List: SVT (supraventricular tachycardia) (06/23/2012)   Assessment: Successful ablation.   Plan: Home today off digoxin and metoprolol.   LOS: 1 day    Cassell Clement 07/17/2012, 10:57 AM

## 2012-07-17 NOTE — Op Note (Signed)
Kyle Armstrong, Kyle Armstrong              ACCOUNT NO.:  [id number]  MEDICAL RECORD NO.:  [id number]  LOCATION:  3W34C                        FACILITY:  [facility]  PHYSICIAN:  Hillis Range, MD       DATE OF BIRTH:  June 28, 1930  DATE OF PROCEDURE:  07/16/2012 DATE OF DISCHARGE:                              OPERATIVE REPORT   SURGEON:  Hillis Range, MD  PREPROCEDURE DIAGNOSIS:  Supraventricular tachycardia.  POSTPROCEDURE DIAGNOSIS:  Atrioventricular nodal reentrant tachycardia.  PROCEDURES: 1. Comprehensive EP study. 2. Coronary sinus pacing and recording. 3. Mapping of supraventricular tachycardia. 4. Ablation of supraventricular tachycardia. 5. Arrhythmia induction with pacing and isoproterenol infusion.  INTRODUCTION:  [name] is a pleasant 78 year old gentleman with recurrent symptomatic supraventricular tachycardia.  He has been documented to have a short RP tachycardia, which terminates with adenosine.  Unfortunately he has had several recent ER admissions due to SVT despite medical therapy with metoprolol and digoxin.  He therefore presents today for EP study and radiofrequency ablation.  DESCRIPTION OF PROCEDURE:  Informed written consent was obtained and the patient was brought to the electrophysiology lab in the fasting state. He was adequately sedated with intravenous Versed and fentanyl as outlined in the nursing report.  The patient's right neck and groin were prepped and draped in the usual sterile fashion by the EP lab staff. Using a percutaneous Seldinger technique, one 6-French hemostasis sheath was placed into the right internal jugular vein.  A 6-French curved Damato catheter was introduced through the right internal jugular vein and advanced into the coronary sinus for recording and pacing from this location.  Two 6-French and one 8-French hemostasis sheaths were placed into the right common femoral vein.  Two 6-French quadripolar Josephson catheters were introduced  through the right common femoral vein and advanced into the His bundle and right ventricular apex positions respectively.  The patient presented to the electrophysiology lab in normal sinus rhythm.  His PR interval measured 168 msec with a QRS duration of 100 msec and a QT interval of 431 msec.  His average RR interval was 981 msec.  His AH interval measured 80 msec with an HV interval of 47 msec.  His surface QRS was of an incomplete right bundle branch and left anterior hemiblock QRS configuration, however, the QRS was only 100 msec.  Ventricular pacing was performed, which revealed midline concentric decremental VA conduction with a VA Wenckebach cycle length of 340 msec with no arrhythmias observed.  Ventricular extra stimulus testing was performed, which revealed midline concentric decremental VA conduction with a retrograde AH jump with no echo beats or tachycardias observed.  The ventricular ERP was 600/250 msec. Ventricular extra stimulus testing was then performed with a basic cycle length of 500 msec, which revealed decremental VA conduction with a single retrograde jump but no echo beats or tachycardias observed.  The ventricular ERP was 500/240 msec.  Rapid atrial pacing was performed, which revealed PR much greater than RR with an AV Wenckebach cycle length of 310 msec.  With rapid atrial pacing at 350 msec, the patient had easily inducible and reproducible tachycardia.  This was a one-to- one tachycardia with the earliest retrograde atrial activation recorded from  the His electrogram.  The VA time measured 20 msec.  Ventricular pacing was attempted during tachycardia, however, tachycardia terminated.  Atrial extra stimulus testing was performed, which revealed an AH jump with tachycardia induced.  The tachycardia was the same tachycardia as previously described and was felt to represent classic AV nodal reentrant tachycardia with antegrade conduction over the slow AV nodal  pathway and retrograde conduction over the fast pathway.  The cycle length of tachycardia was 340 msec.  The tachycardia terminated with an atrial activation thus excluding atrial tachycardia.  This tachycardia was easily induced with 500/320, 500/310, and 500/280 msec. The atrial ERP was 500/250 msec.  As the patient had clear documentation of AV nodal reentrant tachycardia as well as dual AV nodal physiology, I elected to perform slow AV nodal pathway ablation today.  A 7-French Biosense Webster 4 mm Celsius ablation catheter was introduced through the right common femoral vein and advanced into the right atrium. Atrial mapping of Koch's triangle was performed.  This demonstrated a standard triangle.  The ablation catheter was positioned at site 9 in Koch's triangle.  A single radiofrequency application was delivered at 50 watts with a target temperature of 60 degrees for 45 seconds.  During tachycardia, accelerated junctional rhythm was observed with intact VA conduction.  Following ablation, rapid atrial pacing was performed, which revealed no evidence of PR greater than RR.  The AV Wenckebach cycle length was 420 msec.  Atrial extra stimulus testing was performed, which revealed decremental AV conduction with no AH jumps, echo beats, or tachycardias observed.  The AV nodal ERP was 500/340 msec. Ventricular extra stimulus testing was performed, which revealed decremental retrograde VA conduction with no retrograde jumps, echo beats, or tachycardias observed.  The ventricular ERP was 500/220 msec. Isoproterenol was infused at 2 mcg/minute.  During isoproterenol infusion, rapid atrial pacing was performed, which revealed an AV Wenckebach cycle length of 310 msec with no evidence of PR greater than RR and no tachycardias observed.  Atrial extra stimulus testing was performed, which revealed decremental AV conduction with no AH jumps, echo beats, or tachycardias.  The atrial ERP was  500/240 msec. Isoproterenol was decreased to 1 mcg/minute.  Ventricular pacing was performed, which revealed intact VA conduction with a VA Wenckebach cycle length of less than 300 msec.  Ventricular extra stimulus testing was performed, which revealed decremental VA conduction with jumps, echo beats, or tachycardias observed.  Isoproterenol was discontinued and allowed to wash out.  Following ablation, the AH interval measured 78 msec with an HV interval of 51 msec.  The patient was observed for 30 minutes.  Following a 30 minute waiting period, there was no evidence of dual AV nodal physiology and tachycardia could no longer be induced. The procedure was therefore considered completed.  All catheters were removed and the sheaths were aspirated and flushed.  The sheaths were removed and hemostasis was assured.  There were no early apparent complications.  CONCLUSIONS: 1. Sinus rhythm upon presentation. 2. The patient has dual AV nodal physiology with easily inducible and     incessant classic AV nodal reentrant tachycardia. 3. Successful ablation of the slow AV nodal pathway with no inducible     arrhythmias following ablation both on and off of isoproterenol 4. No early apparent complications.     Hillis Range, MD     JA/MEDQ  D:  07/16/2012  T:  07/17/2012  Job:  [id number]

## 2012-08-20 ENCOUNTER — Encounter: Payer: Medicare Other | Admitting: Internal Medicine

## 2012-08-25 ENCOUNTER — Encounter: Payer: Self-pay | Admitting: Internal Medicine

## 2012-08-25 ENCOUNTER — Ambulatory Visit (INDEPENDENT_AMBULATORY_CARE_PROVIDER_SITE_OTHER): Payer: Medicare Other | Admitting: Internal Medicine

## 2012-08-25 VITALS — BP 102/60 | HR 71 | Ht 71.0 in | Wt 188.6 lb

## 2012-08-25 DIAGNOSIS — I498 Other specified cardiac arrhythmias: Secondary | ICD-10-CM

## 2012-08-25 DIAGNOSIS — I471 Supraventricular tachycardia: Secondary | ICD-10-CM

## 2012-08-25 NOTE — Patient Instructions (Addendum)
Your physician recommends that you schedule a follow-up appointment as needed  

## 2012-08-25 NOTE — Progress Notes (Signed)
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  Since his recent SVT ablation, the patient reports doing very well.  He denies procedure related complications.  He has had no further SVT and is pleased with the results of his procedure.  Today, he denies symptoms of palpitations, chest pain, shortness of breath,  lower extremity edema, dizziness, presyncope, or syncope.  The patient is otherwise without complaint today.   Past Medical History  Diagnosis Date  . Arthritis   . SVT (supraventricular tachycardia)     AVNRT s/p ablation by Dr Johney Frame  . Hypothyroidism   . Hyperlipidemia   . Pernicious anemia    Past Surgical History  Procedure Date  . Hernia repair   . Ep study and ablation 07/16/12    slow pathway ablation for AVNRT by DR Issaih Kaus    Current Outpatient Prescriptions  Medication Sig Dispense Refill  . aspirin 325 MG EC tablet Take 325 mg by mouth every evening.      . B Complex-C (SUPER B COMPLEX/VITAMIN C) TABS Take 1 tablet by mouth every other day.       . clobetasol ointment (TEMOVATE) 0.05 % Apply 1 application topically 2 (two) times daily. To affected areas on hands       . Cyanocobalamin (VITAMIN B-12 IJ) Inject as directed every 30 (thirty) days.      . ferrous fumarate (HEMOCYTE - 106 MG FE) 325 (106 FE) MG TABS Take 1 tablet by mouth daily.       . fish oil-omega-3 fatty acids 1000 MG capsule Take 2 g by mouth every other day.       . ibuprofen (ADVIL,MOTRIN) 200 MG tablet Take 200 mg by mouth every 6 (six) hours as needed. For pain       . levothyroxine (SYNTHROID, LEVOTHROID) 50 MCG tablet Take 50 mcg by mouth every morning.      . meclizine (ANTIVERT) 25 MG tablet Take 25 mg by mouth 3 (three) times daily as needed.       . Multiple Vitamin (MULTIVITAMIN WITH MINERALS) TABS Take 1 tablet by mouth every other day.       . ranitidine (ZANTAC) 150 MG  capsule Take 150 mg by mouth as needed.       . simvastatin (ZOCOR) 40 MG tablet Take 40 mg by mouth every evening.      . triamcinolone (NASACORT) 55 MCG/ACT nasal inhaler Place 2 sprays into the nose as needed.       . triamcinolone cream (KENALOG) 0.1 % Apply 1 application topically as needed. To affected areas on hands      . vitamin B-12 (CYANOCOBALAMIN) 100 MCG tablet Take 50 mcg by mouth every other day.         Physical Exam: Filed Vitals:   08/25/12 1129  BP: 102/60  Pulse: 71  Height: 5\' 11"  (1.803 m)  Weight: 188 lb 9.6 oz (85.548 kg)    GEN- The patient is well appearing, alert and oriented x 3 today.   Head- normocephalic, atraumatic Eyes-  Sclera clear, conjunctiva pink Ears- hearing intact Oropharynx- clear Lungs- Clear to ausculation bilaterally, normal work of breathing Heart- Regular rate and rhythm, no murmurs, rubs or gallops, PMI not laterally displaced GI- soft, NT, ND, + BS Extremities- no clubbing, cyanosis, or edema   Assessment and Plan:  1. SVT- doing well s/p ablation No further workup  planned I will see him as needed going forward.

## 2013-08-19 ENCOUNTER — Other Ambulatory Visit: Payer: Self-pay | Admitting: Gastroenterology

## 2013-08-25 ENCOUNTER — Other Ambulatory Visit: Payer: Self-pay | Admitting: Gastroenterology

## 2013-08-25 DIAGNOSIS — R109 Unspecified abdominal pain: Secondary | ICD-10-CM

## 2013-08-30 ENCOUNTER — Ambulatory Visit
Admission: RE | Admit: 2013-08-30 | Discharge: 2013-08-30 | Disposition: A | Discharge status: Home / Self Care | Payer: Medicare Other | Source: Ambulatory Visit | Attending: Gastroenterology | Admitting: Gastroenterology

## 2013-08-30 DIAGNOSIS — R109 Unspecified abdominal pain: Secondary | ICD-10-CM

## 2013-08-30 MED ORDER — IOHEXOL 300 MG/ML  SOLN
100.0000 mL | Freq: Once | INTRAMUSCULAR | Status: AC | PRN
  Administered 2013-08-30: 100 mL via INTRAVENOUS

## 2013-10-06 ENCOUNTER — Other Ambulatory Visit: Payer: Self-pay | Admitting: Gastroenterology

## 2013-10-11 ENCOUNTER — Encounter (INDEPENDENT_AMBULATORY_CARE_PROVIDER_SITE_OTHER): Payer: Self-pay

## 2013-10-11 ENCOUNTER — Telehealth: Payer: Self-pay | Admitting: *Deleted

## 2013-10-11 NOTE — Telephone Encounter (Signed)
Spoke with patient by phone and confirmed appointment with Dr. Truett Perna for 10/14/13  Contact names, directions, and phone numbers were provided.Kyle Armstrong

## 2013-10-14 ENCOUNTER — Encounter: Payer: Self-pay | Admitting: Oncology

## 2013-10-14 ENCOUNTER — Ambulatory Visit (HOSPITAL_BASED_OUTPATIENT_CLINIC_OR_DEPARTMENT_OTHER): Payer: Medicare Other | Admitting: Oncology

## 2013-10-14 ENCOUNTER — Telehealth: Payer: Self-pay | Admitting: Oncology

## 2013-10-14 ENCOUNTER — Ambulatory Visit (HOSPITAL_BASED_OUTPATIENT_CLINIC_OR_DEPARTMENT_OTHER): Payer: Medicare Other

## 2013-10-14 VITALS — HR 75 | Temp 96.9°F | Resp 18 | Ht 71.0 in | Wt 180.6 lb

## 2013-10-14 DIAGNOSIS — C169 Malignant neoplasm of stomach, unspecified: Secondary | ICD-10-CM

## 2013-10-14 DIAGNOSIS — D649 Anemia, unspecified: Secondary | ICD-10-CM

## 2013-10-14 DIAGNOSIS — R634 Abnormal weight loss: Secondary | ICD-10-CM

## 2013-10-14 DIAGNOSIS — K2920 Alcoholic gastritis without bleeding: Secondary | ICD-10-CM

## 2013-10-14 DIAGNOSIS — R630 Anorexia: Secondary | ICD-10-CM

## 2013-10-14 DIAGNOSIS — C163 Malignant neoplasm of pyloric antrum: Secondary | ICD-10-CM

## 2013-10-14 NOTE — Progress Notes (Signed)
Patient reports he is holding all his vitamin supplements at this time as well as his aspirin.

## 2013-10-14 NOTE — Progress Notes (Signed)
Checked in new patient with no financial issues and I gave appt card.

## 2013-10-14 NOTE — Progress Notes (Signed)
[facility] New Patient Consult   Referring Kyle Armstrong   Kyle Armstrong 79 y.o.  [birth year]    Reason for Referral: Gastric cancer     HPI: He was noted to have anemia when he was seen by Dr. Tenny Armstrong for a physical. He was referred to Dr. Ewing Armstrong and was taken to the upper endoscopy on 08/19/2013. Patchy moderate inflammation with edema, erythema, friability, and granularity was found in the gastric fundus, the greater curvature, the lesser curvature, and in the gastric antrum. Biopsies were taken. He also underwent a colonoscopy the same day. Pathology([id number]) revealed a tubular adenoma involving an a sending colon polyp. The stomach biopsy revealed atrophic gastritis with low-grade and high-grade dysplasia. A focus of invasion could not be ruled out. An H. pylori stain was negative.  He was placed on Protonix. A CT of the abdomen and pelvis on 08/30/2013 revealed pulmonary fibrosis at the lung bases. No focal liver abnormality. Prominent mucosa at the gastroesophageal junction. No other gastric abnormality. No adenopathy. The prostate is large.  He was taken to a repeat upper endoscopy on 10/06/2013. A gastric ulcer was found in the prepyloric region. Biopsies were taken. Diffuse mild inflammation was again found at the greater curvature, lesser curvature, and in the gastric antrum. Biopsies were taken. The pathology [id number]) confirmed invasive adenocarcinoma involving the antrum biopsy.. the stomach biopsy revealed chronic atrophic gastritis with intestinal metaplasia. A HER-2/neu staining is pending.  He is referred for oncology evaluation. He is scheduled to see Dr. Donell Armstrong on 10/24/2013.  Past Medical History  Diagnosis Date  . Arthritis   . 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 Hypothyroidism   . Hyperlipidemia   . Pernicious anemia    .   Eczema  .    History of colon polyps  .    History of sarcoidosis  Past  Surgical History  Procedure Laterality Date  . Hernia repair-bilateral inguinal     . Ep study and ablation  07/16/12    slow pathway ablation for AVNRT by DR Kyle Armstrong   .    Left orchiectomy  .    Cholecystectomy                                                                                                             June 2011  Family History  Problem Relation Age of Onset  .  no family history of cancer       Current outpatient prescriptions:clobetasol ointment (TEMOVATE) 0.05 %, Apply 1 application topically 2 (two) times daily. To affected areas on hands , Disp: , Rfl: ;  Cyanocobalamin (VITAMIN B-12 IJ), Inject as directed as directed. Every other month, Disp: , Rfl: ;  ibuprofen (ADVIL,MOTRIN) 200 MG tablet, Take 200 mg by mouth every 6 (six) hours as needed. For pain , Disp: , Rfl:  levothyroxine (SYNTHROID, LEVOTHROID) 50 MCG tablet, Take 50 mcg by mouth every morning., Disp: , Rfl: ;  meclizine (ANTIVERT) 25 MG tablet, Take  25 mg by mouth 3 (three) times daily as needed. , Disp: , Rfl: ;  neomycin-polymyxin-hydrocortisone (CORTISPORIN) 3.5-10000-1 otic suspension, Place 4 drops into both ears 2 (two) times daily., Disp: , Rfl: ;  pantoprazole (PROTONIX) 40 MG tablet, Take 40 mg by mouth 2 (two) times daily., Disp: , Rfl:  ranitidine (ZANTAC) 150 MG capsule, Take 150 mg by mouth as needed. , Disp: , Rfl: ;  simvastatin (ZOCOR) 40 MG tablet, Take 40 mg by mouth every evening., Disp: , Rfl: ;  triamcinolone (NASACORT) 55 MCG/ACT nasal inhaler, Place 2 sprays into the nose as needed. , Disp: , Rfl: ;  triamcinolone cream (KENALOG) 0.1 %, Apply 1 application topically as needed. To affected areas on hands, Disp: , Rfl:  aspirin 325 MG EC tablet, Take 325 mg by mouth every evening., Disp: , Rfl: ;  B Complex-C (SUPER B COMPLEX/VITAMIN C) TABS, Take 1 tablet by mouth every other day. , Disp: , Rfl: ;  Cholecalciferol (VITAMIN D3) 2000 UNITS TABS, Take by mouth daily., Disp: , Rfl: ;  ferrous  fumarate (HEMOCYTE - 106 MG FE) 325 (106 FE) MG TABS, Take 1 tablet by mouth daily. , Disp: , Rfl:  fish oil-omega-3 fatty acids 1000 MG capsule, Take 2 g by mouth every other day. , Disp: , Rfl: ;  Glucose Blood (BAYER CONTOUR TEST VI), by In Vitro route. Check sugar daily., Disp: , Rfl: ;  Multiple Vitamin (MULTIVITAMIN WITH MINERALS) TABS, Take 1 tablet by mouth every other day. , Disp: , Rfl: ;  vitamin B-12 (CYANOCOBALAMIN) 100 MCG tablet, Take 50 mcg by mouth every other day. , Disp: , Rfl:   Allergies:  Allergies  Allergen Reactions  . Lipitor [Atorvastatin] Other (See Comments)    Leg cramps    Social History: He lives alone in [place]. 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 He is retired from [employer]. He quit smoking cigarettes in the 1980s after smoking one pack a day for 35 years. He does not use alcohol. He was in the [employer]-4 years. No transfusion history. No risk factor for HIV or hepatitis.  ROS:   Positives include: Anorexia, 20 pound weight loss, black stool when taking iron, chronic bilateral shoulder and low back pain, hip pain during cold weather, decreased energy  A complete ROS was otherwise negative.  Physical Exam:  Pulse 75, temperature 96.9 F (36.1 C), temperature source Oral, resp. rate 18, height 5\' 11"  (1.803 m), weight 180 lb 9.6 oz (81.92 kg).  HEENT: Upper and lower denture plate, oropharynx without visible mass, neck without mass Lungs: Inspiratory rales at the lower posterior chest bilaterally, no respiratory distress Cardiac: Regular rate and rhythm Abdomen: No hepato- splenomegaly, nontender, no mass GU: Status post left orchiectomy. Right testis without mass  Vascular: The left lower leg is larger than the right side (she reports this is a chronic finding), bilateral low leg varicosities Lymph nodes: No cervical, supraclavicular, axillary, or inguinal nodes Neurologic: Alert and oriented, the motor exam  appears intact in the upper and lower extremities Skin: Erythematous maculopapular rash at the upper back, plaque-like area of erythema at the left mid back, multiple moles Musculoskeletal: No spine tenderness   LAB:  CBC  08/26/2013: Hemoglobin 11.2, white count 5.6, platelets 200,000, ANC 3.7 CMP   08/26/2013: Creatinine 0.9, albumin 4.0, bilirubin 0.5, alkaline phosphatase 82, AST 28, ALT 33  Radiology: As per history of present illness   Assessment/Plan:  1. Gastric cancer-invasive adenocarcinoma involving an antral ulcer, he appears to have localized disease based on the staging evaluation to date. HER-2/neu staining pending  2. Diffuse "gastritis "change noted on endoscopies 08/19/2013 and 10/06/2013 with biopsies confirming atrophic gastritis. Low and high-grade dysplasia were noted on the biopsy 08/19/2013  3. Anemia  4. Anorexia/weight loss  5. History of colon polyp  6. History of "sarcoidosis "  7. History of tobacco use  8. History of arrhythmia, status post and ablation August 2013   Disposition:   Mr. Whitenack has been diagnosed with gastric cancer. I discussed the diagnosis of gastric cancer and treatment options with the patient and his daughter. I explained most patients with gastric cancer present with advanced stage disease and are not candidates for curative surgery. He appears to have localized disease based on the staging evaluation to date, but he may have more advanced disease than seen on the abdominal CT.  He will see Dr. Donell Armstrong on 10/24/2013 to review surgical options. He may not be a candidate for a partial gastrectomy given the extent of the "gastritis "type changes seen at endoscopy. If he is not a surgical candidate we will consider systemic chemotherapy and radiation options.  His case will be presented at the GI tumor conference on 10/19/2013. He will return for an office visit here on 10/27/2013. Bryndle Corredor 10/14/2013, 5:10 PM

## 2013-10-14 NOTE — Telephone Encounter (Signed)
gv pt appt schedule for december.  °

## 2013-10-14 NOTE — Progress Notes (Signed)
Met with Kittie Plater and family. Explained role of nurse navigator. Educational information provided on gastric cancer  [facility] resources provided to patient, including SW service information.  Patient declined referrals for support at this time, including SW and dietician.  He wants to wait and see if he is going to have to have surgery.  Contact names and phone numbers were provided for entire [facility] team.  Teach back method was used.  No barriers to care identified.  Will continue to follow as needed.

## 2013-10-24 ENCOUNTER — Encounter (INDEPENDENT_AMBULATORY_CARE_PROVIDER_SITE_OTHER): Payer: Self-pay | Admitting: General Surgery

## 2013-10-24 ENCOUNTER — Ambulatory Visit (INDEPENDENT_AMBULATORY_CARE_PROVIDER_SITE_OTHER): Payer: Medicare Other | Admitting: General Surgery

## 2013-10-24 VITALS — BP 118/70 | HR 80 | Temp 98.4°F | Resp 14 | Ht 71.0 in | Wt 184.6 lb

## 2013-10-24 DIAGNOSIS — C169 Malignant neoplasm of stomach, unspecified: Secondary | ICD-10-CM

## 2013-10-24 HISTORY — DX: Malignant neoplasm of stomach, unspecified: C16.9

## 2013-10-24 NOTE — Patient Instructions (Signed)
We will order chest CT to evaluate for metastases.    I will communicate with Dr. Truett Perna which way to go.

## 2013-10-26 ENCOUNTER — Telehealth (INDEPENDENT_AMBULATORY_CARE_PROVIDER_SITE_OTHER): Payer: Self-pay | Admitting: *Deleted

## 2013-10-26 NOTE — Telephone Encounter (Signed)
I called pt and informed him of the appt for his chest CT scheduled for tomorrow 12/4 with an arrival time of 2:00pm at [facility].  No solid foods 4 hours prior.  Pt is agreeable with this appt.

## 2013-10-27 ENCOUNTER — Other Ambulatory Visit: Payer: Medicare Other

## 2013-10-27 ENCOUNTER — Ambulatory Visit (HOSPITAL_BASED_OUTPATIENT_CLINIC_OR_DEPARTMENT_OTHER): Payer: Medicare Other | Admitting: Oncology

## 2013-10-27 ENCOUNTER — Ambulatory Visit
Admission: RE | Admit: 2013-10-27 | Discharge: 2013-10-27 | Disposition: A | Discharge status: Home / Self Care | Payer: Medicare Other | Source: Ambulatory Visit | Attending: General Surgery | Admitting: General Surgery

## 2013-10-27 VITALS — BP 118/66 | HR 80 | Temp 98.1°F | Resp 20 | Ht 71.0 in | Wt 181.9 lb

## 2013-10-27 DIAGNOSIS — C163 Malignant neoplasm of pyloric antrum: Secondary | ICD-10-CM

## 2013-10-27 DIAGNOSIS — C169 Malignant neoplasm of stomach, unspecified: Secondary | ICD-10-CM

## 2013-10-27 DIAGNOSIS — D649 Anemia, unspecified: Secondary | ICD-10-CM

## 2013-10-27 MED ORDER — IOHEXOL 300 MG/ML  SOLN
75.0000 mL | Freq: Once | INTRAMUSCULAR | Status: AC | PRN
  Administered 2013-10-27: 75 mL via INTRAVENOUS

## 2013-10-27 NOTE — Progress Notes (Signed)
   [facility]    OFFICE PROGRESS NOTE   INTERVAL HISTORY:   Mr. Teichert returns for scheduled followup. He reports early satiety. He has noted nodular areas at the dorsum of the hands. He relates this to sarcoidosis. He continues to work. No other complaint.  He saw Dr. Donell Beers earlier this week.  Objective:  Vital signs in last 24 hours:  Blood pressure 118/66, pulse 80, temperature 98.1 F (36.7 C), temperature source Oral, resp. rate 20, height 5\' 11"  (1.803 m), weight 181 lb 14.4 oz (82.509 kg).    Physical exam-not performed today  X-rays: CT the chest 10/27/2013-mild pulmonary fibrosis, borderline enlarged mediastinal nodes I reviewed the CT images with Mr. Gettel   Medications: I have reviewed the patient's current medications.  Assessment/Plan: 1. Gastric cancer-invasive adenocarcinoma involving an antral ulcer, he appears to have localized disease based on the staging evaluation to date. HER-2/neu amplified  Staging chest CT negative on 10/27/2013 2. Diffuse "gastritis "change noted on endoscopies 08/19/2013 and 10/06/2013 with biopsies confirming atrophic gastritis. Low and high-grade dysplasia were noted on the biopsy 08/19/2013  3. Anemia  4. Anorexia/weight loss  5. History of colon polyp  6. History of "sarcoidosis " , pulmonary fibrosis noted on the chest CT 10/27/2013 7. History of tobacco use  8. History of arrhythmia, status post and ablation August 2013    Disposition:  Mr. Mofield appears stable. I discussed the case with Dr. Donell Beers. She recommends neoadjuvant therapy prior to a planned partial gastrectomy. I discussed systemic chemotherapy and chemotherapy/radiation options with Mr. Lamarque. I explained there is data to support a benefit for neoadjuvant chemotherapy and adjuvant chemotherapy/radiation. I think it would be difficult for him to tolerate a multi-agent chemotherapy regimen. I recommend proceeding with neoadjuvant Xeloda and  radiation.  We will make a referral to Dr. Mitzi Hansen to see if he agrees. We will plan for a tentative start date on 11/07/2013.  I reviewed the potential toxicities associated with capecitabine including the chance for mucositis, diarrhea, and hematologic toxicity. We discussed the rash, hyperpigmentation, and hand/foot syndrome associated with capecitabine. He agrees to proceed. He will attend a chemotherapy teaching class. We discussed the possibility of nausea with capecitabine and radiation.  Mr. Delone will return for an office visit on 11/16/2013.   Thornton Papas, MD  10/27/2013  6:02 PM

## 2013-10-28 ENCOUNTER — Encounter: Payer: Self-pay | Admitting: Radiation Oncology

## 2013-10-28 ENCOUNTER — Encounter: Payer: Self-pay | Admitting: Oncology

## 2013-10-28 ENCOUNTER — Telehealth: Payer: Self-pay | Admitting: Oncology

## 2013-10-28 ENCOUNTER — Telehealth: Payer: Self-pay | Admitting: *Deleted

## 2013-10-28 DIAGNOSIS — C169 Malignant neoplasm of stomach, unspecified: Secondary | ICD-10-CM

## 2013-10-28 MED ORDER — CAPECITABINE 500 MG PO TABS: 1500.0000 mg | ORAL_TABLET | Freq: Two times a day (BID) | ORAL | Status: DC

## 2013-10-28 NOTE — Progress Notes (Signed)
GI Location of Tumor / Histology:Stomach   Patient presented with anemia,  Wt.loss 20 lbs,   Biopsies of 10/06/13 : Diagnosis 1. Stomach, biopsy, antrum- INVASIVE ADENOCARCINOMA, INTESTINAL TYPE.- BACKGROUND CHRONIC ATROPHIC GASTRITIS WITH INTESTINAL METAPLASIA.- PLEASE SEE COMMENT. 2. Stomach, biopsy- CHRONIC ATROPHIC GASTRITIS WITH INTESTINAL METAPLASIA.  Past/Anticipated interventions by surgeon, if any:10/28/13: Dr.Byerly  Seen patient on 10/24/13, note:ASSESSMENT AND PLAN:  Gastric cancer  The patient needs to be fully staged with a chest CT.  I would lean toward neoadjuvant chemoradiation for him given the mucosal changes that are seen in the upper stomach. I have concern about linitis plastica given his weight loss and symptoms.  I do not think he would do well with a total gastrectomy; Hopefully this would not be required.  I will discuss this with Dr. Truett Perna.  After neoadjuvant chemoradiation, we will restage him. As long as he has no evidence of metastatic disease, I would proceed with diagnostic laparoscopy and distal gastrectomy with frozen sections for margins. He is in very good shape despite his age. He continues to work and I think is a good surgical candidate based on his functional status.      Past/Anticipated interventions by medical oncology, if any:  Recommends neoadjuvant Xeloda and radiation, tentative start date on  11/07/13  Weight changes, if any: 20 lb wt.loss  Bowel/Bladder complaints, if any:   Nausea / Vomiting, if any:   Pain issues, if any:  Arthritis in left shoulder and arm, stomach cramps at times   SAFETY ISSUES:  Prior radiation? no  Pacemaker/ICD? no  Possible current pregnancy? no  Is the patient on methotrexate? no  Current Complaints / other details:  Widowed, 2 children, works 3d week [employer], [place] wears b/l hearing aids,  hx Sarcoidosis,pulmonary fibrosis ct 10/27/13, SVT s/p ablation  07/16/12 Dr.Allred,pernicious anemia,

## 2013-10-28 NOTE — Progress Notes (Signed)
Chief Complaint  Patient presents with  . New Evaluation    eval Gastric CA    HISTORY: The patient is an 79 year old male who presented with anemia. He had been taking significant nonsteroidal anti-inflammatories for pruritus. He did not think he had any blood in his stools. He was instructed to stop the NSAIDs and underwent endoscopy. On his first endoscopy, he was found to have some mucosal changes in the upper stomach and a ulcer in the antrum. Biopsies of the ulcer were not definitive for cancer but were suspicious. He underwent protonic street meds and was rescoped. The repeat biopsy of the ulcer it was positive for adenocarcinoma. He has lost some weight. He thinks has lost around 20 pounds. He does not have much of an appetite and gets full very quickly. He has not had significant nausea or vomiting. He did not have any history of coronary artery disease but has had SVT status post ablation. He does not have family history of cancer of which he is aware.  Past Medical History  Diagnosis Date  . Arthritis   . SVT (supraventricular tachycardia)     AVNRT s/p ablation by Dr Johney Frame  . Hypothyroidism   . Hyperlipidemia   . Pernicious anemia   . Gastric cancer 10/24/2013    Past Surgical History  Procedure Laterality Date  . Hernia repair    . Ep study and ablation  07/16/12    slow pathway ablation for AVNRT by DR Allred    Current Outpatient Prescriptions  Medication Sig Dispense Refill  . aspirin 325 MG EC tablet Take 325 mg by mouth every evening.      . B Complex-C (SUPER B COMPLEX/VITAMIN C) TABS Take 1 tablet by mouth every other day.       . Cholecalciferol (VITAMIN D3) 2000 UNITS TABS Take by mouth daily.      . clobetasol ointment (TEMOVATE) 0.05 % Apply 1 application topically 2 (two) times daily. To affected areas on hands       . Cyanocobalamin (VITAMIN B-12 IJ) Inject as directed as directed. Every other month      . ferrous fumarate (HEMOCYTE - 106 MG FE) 325 (106 FE)  MG TABS Take 1 tablet by mouth daily.       . fish oil-omega-3 fatty acids 1000 MG capsule Take 2 g by mouth every other day.       . Glucose Blood (BAYER CONTOUR TEST VI) by In Vitro route. Check sugar daily.      Marland Kitchen ibuprofen (ADVIL,MOTRIN) 200 MG tablet Take 200 mg by mouth every 6 (six) hours as needed. For pain       . levothyroxine (SYNTHROID, LEVOTHROID) 50 MCG tablet Take 50 mcg by mouth every morning.      . meclizine (ANTIVERT) 25 MG tablet Take 25 mg by mouth 3 (three) times daily as needed.       . Multiple Vitamin (MULTIVITAMIN WITH MINERALS) TABS Take 1 tablet by mouth every other day.       . neomycin-polymyxin-hydrocortisone (CORTISPORIN) 3.5-10000-1 otic suspension Place 4 drops into both ears 2 (two) times daily.      . pantoprazole (PROTONIX) 40 MG tablet Take 40 mg by mouth 2 (two) times daily.      . ranitidine (ZANTAC) 150 MG capsule Take 150 mg by mouth as needed.       . simvastatin (ZOCOR) 40 MG tablet Take 40 mg by mouth every evening.      Marland Kitchen  triamcinolone (NASACORT) 55 MCG/ACT nasal inhaler Place 2 sprays into the nose as needed.       . triamcinolone cream (KENALOG) 0.1 % Apply 1 application topically as needed. To affected areas on hands      . vitamin B-12 (CYANOCOBALAMIN) 100 MCG tablet Take 50 mcg by mouth every other day.       . capecitabine (XELODA) 500 MG tablet Take 3 tablets (1,500 mg total) by mouth 2 (two) times daily after a meal. On days of radiation only (Mon-Fri)  150 tablet  0   No current facility-administered medications for this visit.     Allergies  Allergen Reactions  . Lipitor [Atorvastatin] Other (See Comments)    Leg cramps     Family History  Problem Relation Age of Onset  . Hypertension       History   Social History  . Marital Status: Widowed    Spouse Name: N/A    Number of Children: 2  . Years of Education: N/A   Social History Main Topics  . Smoking status: Former Smoker -- 1.50 packs/day for 35 years    Types:  Cigarettes    Quit date: 11/24/1990  . Smokeless tobacco: None  . Alcohol Use: No  . Drug Use: No  . Sexual Activity: No   Other Topics Concern  . 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  EXAMCeasar Mons Vitals:   10/24/13 1417  BP: 118/70  Pulse: 80  Temp: 98.4 F (36.9 C)  Resp: 14   Filed Weights   10/24/13 1417  Weight: 184 lb 9.6 oz (83.734 kg)     Gen:  No acute distress.  Well nourished and well groomed.   Neurological: Alert and oriented to person, place, and time. Coordination normal.  Head: Normocephalic and atraumatic.  Eyes: Conjunctivae are normal. Pupils are equal, round, and reactive to light. No scleral icterus.  Neck: Normal range of motion. Neck supple. No tracheal deviation or thyromegaly present.  Cardiovascular: Normal rate, regular rhythm, normal heart sounds and intact distal pulses.  Exam reveals no gallop and no friction rub.  No murmur heard. Respiratory: Effort normal.  No respiratory distress. No chest wall tenderness. Breath sounds normal.  No wheezes, rales or rhonchi.  GI: Soft. Bowel sounds are normal. The abdomen is soft and nontender.  There is no rebound and no guarding.  Musculoskeletal: Normal range of motion. Extremities are nontender.  Lymphadenopathy: No cervical, preauricular, postauricular or axillary adenopathy is present Skin: Skin is warm and dry. No rash noted. No diaphoresis. No erythema. No pallor. No clubbing, cyanosis, or edema.   Psychiatric: Normal mood and affect. Behavior is normal. Judgment and thought content normal.    LABORATORY RESULTS: Available labs are reviewed  Pathology 1. Stomach, biopsy, antrum -  INVASIVE ADENOCARCINOMA, INTESTINAL TYPE. - BACKGROUND CHRONIC ATROPHIC GASTRITIS WITH INTESTINAL METAPLASIA. - PLEASE SEE COMMENT. 2. Stomach, biopsy - CHRONIC ATROPHIC GASTRITIS WITH INTESTINAL METAPLASIA.   RADIOLOGY RESULTS: See E-Chart or I-Site for most recent results.  Images and reports are reviewed. CT abd/pelvis IMPRESSION:  1. No definite abdominal or pelvic mass or adenopathy.  2.  The gastroesophageal junction appears somewhat thickened but is  decompressed, and therefore difficult to evaluate.  3. Prominent prostate.  4. Diffuse degenerative disc disease throughout the lumbar spine.  5. Changes of pulmonary fibrosis are noted at the lung bases.    ASSESSMENT AND PLAN: Gastric cancer The patient needs to be fully staged with a chest CT.  I would lean toward neoadjuvant chemoradiation for him given the mucosal changes that are seen in the upper stomach.  I have concern about linitis plastica given his weight loss and symptoms.  I do not think he would do well with a total gastrectomy;  Hopefully this would not be required.    I will discuss this with Dr. Truett Perna.  After neoadjuvant chemoradiation, we will restage him. As long as he has no evidence of metastatic disease, I would proceed with diagnostic laparoscopy and distal gastrectomy with frozen sections for margins.  He is in very good shape despite his age. He continues to work and I think is a good surgical candidate based on his functional status.     Maudry Diego MD Surgical Oncology, General and Endocrine Surgery [facility]      Visit Diagnoses: 1. Gastric cancer     Primary Care Physician:  Duane Lope, MD

## 2013-10-28 NOTE — Progress Notes (Signed)
Faxed xeloda prescription to [facility] °

## 2013-10-28 NOTE — Telephone Encounter (Signed)
, °

## 2013-10-28 NOTE — Telephone Encounter (Signed)
Xeloda Rx given to managed care. Pt to take 1500 mg BID on days of radiation only.

## 2013-10-28 NOTE — Assessment & Plan Note (Signed)
The patient needs to be fully staged with a chest CT.  I would lean toward neoadjuvant chemoradiation for him given the mucosal changes that are seen in the upper stomach.  I have concern about linitis plastica given his weight loss and symptoms.  I do not think he would do well with a total gastrectomy;  Hopefully this would not be required.    I will discuss this with Dr. Truett Perna.  After neoadjuvant chemoradiation, we will restage him. As long as he has no evidence of metastatic disease, I would proceed with diagnostic laparoscopy and distal gastrectomy with frozen sections for margins.  He is in very good shape despite his age. He continues to work and I think is a good surgical candidate based on his functional status.

## 2013-10-31 ENCOUNTER — Ambulatory Visit
Admission: RE | Admit: 2013-10-31 | Discharge: 2013-10-31 | Disposition: A | Discharge status: Home / Self Care | Payer: Medicare Other | Source: Ambulatory Visit | Attending: Radiation Oncology | Admitting: Radiation Oncology

## 2013-10-31 ENCOUNTER — Encounter: Payer: Self-pay | Admitting: Radiation Oncology

## 2013-10-31 VITALS — BP 129/63 | HR 80 | Temp 97.6°F | Resp 20 | Ht 71.0 in | Wt 182.4 lb

## 2013-10-31 DIAGNOSIS — D649 Anemia, unspecified: Secondary | ICD-10-CM | POA: Insufficient documentation

## 2013-10-31 DIAGNOSIS — C169 Malignant neoplasm of stomach, unspecified: Secondary | ICD-10-CM

## 2013-10-31 DIAGNOSIS — M19019 Primary osteoarthritis, unspecified shoulder: Secondary | ICD-10-CM | POA: Insufficient documentation

## 2013-10-31 DIAGNOSIS — C163 Malignant neoplasm of pyloric antrum: Secondary | ICD-10-CM

## 2013-10-31 DIAGNOSIS — K2940 Chronic atrophic gastritis without bleeding: Secondary | ICD-10-CM | POA: Insufficient documentation

## 2013-10-31 DIAGNOSIS — C162 Malignant neoplasm of body of stomach: Secondary | ICD-10-CM | POA: Insufficient documentation

## 2013-10-31 DIAGNOSIS — J99 Respiratory disorders in diseases classified elsewhere: Secondary | ICD-10-CM | POA: Insufficient documentation

## 2013-10-31 DIAGNOSIS — D869 Sarcoidosis, unspecified: Secondary | ICD-10-CM | POA: Insufficient documentation

## 2013-10-31 HISTORY — DX: Allergy, unspecified, initial encounter: T78.40XA

## 2013-10-31 NOTE — Progress Notes (Signed)
Please see the Nurse Progress Note in the MD Initial Consult Encounter for this patient. 

## 2013-11-01 ENCOUNTER — Encounter (INDEPENDENT_AMBULATORY_CARE_PROVIDER_SITE_OTHER): Payer: Self-pay

## 2013-11-02 ENCOUNTER — Ambulatory Visit
Admission: RE | Admit: 2013-11-02 | Discharge: 2013-11-02 | Disposition: A | Discharge status: Home / Self Care | Payer: Medicare Other | Source: Ambulatory Visit | Attending: Radiation Oncology | Admitting: Radiation Oncology

## 2013-11-02 DIAGNOSIS — C169 Malignant neoplasm of stomach, unspecified: Secondary | ICD-10-CM | POA: Insufficient documentation

## 2013-11-02 DIAGNOSIS — C163 Malignant neoplasm of pyloric antrum: Secondary | ICD-10-CM | POA: Insufficient documentation

## 2013-11-02 DIAGNOSIS — R1310 Dysphagia, unspecified: Secondary | ICD-10-CM | POA: Insufficient documentation

## 2013-11-02 DIAGNOSIS — R633 Feeding difficulties, unspecified: Secondary | ICD-10-CM | POA: Insufficient documentation

## 2013-11-02 DIAGNOSIS — Z79899 Other long term (current) drug therapy: Secondary | ICD-10-CM | POA: Diagnosis not present

## 2013-11-02 DIAGNOSIS — R110 Nausea: Secondary | ICD-10-CM | POA: Insufficient documentation

## 2013-11-02 DIAGNOSIS — Z510 Encounter for antineoplastic radiation therapy: Secondary | ICD-10-CM | POA: Insufficient documentation

## 2013-11-02 DIAGNOSIS — R630 Anorexia: Secondary | ICD-10-CM | POA: Insufficient documentation

## 2013-11-02 NOTE — Progress Notes (Signed)
Radiation Oncology         [phone]) [phone] ________________________________  Name: Kyle Armstrong MRN: [id number]  Date: 10/31/2013  DOB: Nov 29, 1929  CC: Duane Lope, MD  Ladene Artist, MD     REFERRING PHYSICIAN: Ladene Artist, MD   DIAGNOSIS: The primary encounter diagnosis was Gastric cancer. A diagnosis of Cancer of antrum of stomach was also pertinent to this visit.   HISTORY OF PRESENT ILLNESS::Kyle Armstrong is a 79 y.o. male who is seen for an initial consultation visit. The patient is an 79 year old male who was noted to have anemia. He therefore was referred to undergo an upper and DOS to be by Dr. Janae Bridgeman God. Patchy inflammation was seen with edema and some friability was present within the gastric fundus. This was also present in other locations and biopsies were obtained. He also underwent a colonoscopy on the same day. Pathology revealed a tubular adenoma within the colon and atrophic gastritis with low and high grade dysplasia within the stomach. No malignancy was seen. The patient was placed on proton ex.  A CT scan of the abdomen and pelvis was completed on 08/30/2013. No adenopathy was seen. Prominent mucosa was present at the gastroesophageal junction but no other gastric abnormality was present.  The patient was then taken for a repeat upper endoscopy on 10/06/2013. A gastric ulcer was found in the prepyloric region. Biopsies were taken. Inflammation was once again seen in multiple areas. The antral biopsy returned positive for invasive adenocarcinoma.  The patient has been seen by Dr. Rolene Course in surgical oncology. She feels that preoperative treatment would be helpful for the patient. She does have concerns of more diffuse disease within the stomach than that which has been confirmed at this point. The patient has been seen by Dr. Truett Perna in medical oncology as well.   PREVIOUS RADIATION THERAPY: No   PAST MEDICAL HISTORY:  has a past medical history of Arthritis;  SVT (supraventricular tachycardia); Hypothyroidism; Hyperlipidemia; Pernicious anemia; Allergy; and Gastric cancer (10/24/2013).     PAST SURGICAL HISTORY: Past Surgical History  Procedure Laterality Date  . Hernia repair    . 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 Cholecystectomy       FAMILY HISTORY: family history includes Hypertension in an other family member.   SOCIAL HISTORY:  reports that he quit smoking about 22 years ago. His smoking use included Cigarettes. He has a 52.5 pack-year smoking history. He does not have any smokeless tobacco history on file. He reports that he does not drink alcohol or use illicit drugs.   ALLERGIES: Lipitor   MEDICATIONS:  Current Outpatient Prescriptions  Medication Sig Dispense Refill  . acetaminophen (TYLENOL) 500 MG tablet Take 500 mg by mouth every 6 (six) hours as needed.      . clobetasol ointment (TEMOVATE) 0.05 % Apply 1 application topically 2 (two) times daily. To affected areas on hands       . Cyanocobalamin (VITAMIN B-12 IJ) Inject as directed as directed. Every other month      . Glucose Blood (BAYER CONTOUR TEST VI) by In Vitro route. Check sugar daily.      Marland Kitchen levothyroxine (SYNTHROID, LEVOTHROID) 50 MCG tablet Take 50 mcg by mouth every morning.      . meclizine (ANTIVERT) 25 MG tablet Take 25 mg by mouth 3 (three) times daily as needed.       . neomycin-polymyxin-hydrocortisone (CORTISPORIN) 3.5-10000-1  otic suspension Place 4 drops into both ears 2 (two) times daily.      . pantoprazole (PROTONIX) 40 MG tablet Take 40 mg by mouth 2 (two) times daily.      . ranitidine (ZANTAC) 150 MG capsule Take 150 mg by mouth as needed.       . simvastatin (ZOCOR) 40 MG tablet Take 40 mg by mouth every evening.      . triamcinolone (NASACORT) 55 MCG/ACT nasal inhaler Place 2 sprays into the nose as needed.       . triamcinolone cream (KENALOG) 0.1 % Apply 1 application topically as needed. To  affected areas on hands      . B Complex-C (SUPER B COMPLEX/VITAMIN C) TABS Take 1 tablet by mouth every other day.       . capecitabine (XELODA) 500 MG tablet Take 3 tablets (1,500 mg total) by mouth 2 (two) times daily after a meal. On days of radiation only (Mon-Fri)  150 tablet  0  . Cholecalciferol (VITAMIN D3) 2000 UNITS TABS Take by mouth daily.      . ferrous fumarate (HEMOCYTE - 106 MG FE) 325 (106 FE) MG TABS Take 1 tablet by mouth daily.       . fish oil-omega-3 fatty acids 1000 MG capsule Take 2 g by mouth every other day.       . Multiple Vitamin (MULTIVITAMIN WITH MINERALS) TABS Take 1 tablet by mouth every other day.       . vitamin B-12 (CYANOCOBALAMIN) 100 MCG tablet Take 50 mcg by mouth every other day.        No current facility-administered medications for this encounter.     REVIEW OF SYSTEMS:  A 15 point review of systems is documented in the electronic medical record. This was obtained by the nursing staff. However, I reviewed this with the patient to discuss relevant findings and make appropriate changes.  Pertinent items are noted in HPI.    PHYSICAL EXAM:  height is 5\' 11"  (1.803 m) and weight is 182 lb 6.4 oz (82.736 kg). His oral temperature is 97.6 F (36.4 C). His blood pressure is 129/63 and his pulse is 80. His respiration is 20 and oxygen saturation is 97%.   ECOG = 1  0 - Asymptomatic (Fully active, able to carry on all predisease activities without restriction)  1 - Symptomatic but completely ambulatory (Restricted in physically strenuous activity but ambulatory and able to carry out work of a light or sedentary nature. For example, light housework, office work)  2 - Symptomatic, <50% in bed during the day (Ambulatory and capable of all self care but unable to carry out any work activities. Up and about more than 50% of waking hours)  3 - Symptomatic, >50% in bed, but not bedbound (Capable of only limited self-care, confined to bed or chair 50% or more of  waking hours)  4 - Bedbound (Completely disabled. Cannot carry on any self-care. Totally confined to bed or chair)  5 - Death   Santiago Glad MM, Creech RH, Tormey DC, et al. [phone]). "Toxicity and response criteria of the [facility]". Am. Evlyn Clines. Oncol. 5 (6): 649-55  General: Well-developed, in no acute distress HEENT: Normocephalic, atraumatic; oral cavity clear Neck: Supple without any lymphadenopathy Cardiovascular: Regular rate and rhythm Respiratory: Clear to auscultation bilaterally GI: Soft, nontender, normal bowel sounds Extremities: No edema present Neuro: No focal deficits     LABORATORY DATA:  Lab Results  Component Value  Date   WBC 7.0 07/09/2012   HGB 12.4* 07/09/2012   HCT 37.9* 07/09/2012   MCV 93.4 07/09/2012   PLT 181.0 07/09/2012   Lab Results  Component Value Date   NA 137 07/09/2012   K 4.6 07/09/2012   CL 103 07/09/2012   CO2 28 07/09/2012   Lab Results  Component Value Date   ALT 100* 04/30/2010   AST 86* 04/30/2010   ALKPHOS 51 04/30/2010   BILITOT 0.9 04/30/2010      RADIOGRAPHY: Ct Chest W Contrast  10/27/2013   CLINICAL DATA:  History of gastric cancer diagnosed 6 weeks ago. Evaluate for metastatic disease. Known history of sarcoidosis.  EXAM: CT CHEST WITH CONTRAST  TECHNIQUE: Multidetector CT imaging of the chest was performed during intravenous contrast administration.  CONTRAST:  75mL OMNIPAQUE IOHEXOL 300 MG/ML SOLN (labs drawn 315 with BUN 16 and creatinine 1.1)  COMPARISON:  Abdominal pelvic CT 08/30/2013  FINDINGS: Lungs are adequately inflated with mild bilateral pulmonary fibrosis. There is mild patchy peripheral nodular and linear density in the apices likely scarring related to patient's fibrosis. There is no focal consolidation or effusion. Heart is within normal. There is calcification over the left anterior descending coronary artery. There is a 1 cm subcarinal lymph node. There is a 9 mm and 1 cm precarinal lymph node. There is  no hilar or axillary adenopathy. There is minimal calcification of the thoracic aorta.  Images through the upper abdomen there is an 8 mm hypodensity over the spleen unchanged likely a cyst or hemangioma. There has been a prior cholecystectomy. Remainder of the upper abdomen is unchanged. There are degenerative changes of the spine.  IMPRESSION: Evidence of mild pulmonary fibrosis. Patchy peripheral nodularity and linear density in the apices likely part of this fibrotic process, although cannot completely exclude metastatic disease. A couple borderline 1 cm mediastinal lymph nodes likely reactive. Recommend followup as clinically indicated.  Atherosclerotic coronary artery disease.  8 mm hypodensity over the spleen unchanged likely a cyst or hemangioma.   Electronically Signed   By: Elberta Fortis M.D.   On: 10/27/2013 16:39       IMPRESSION: The patient appears to have a locally advanced gastric adenocarcinoma. No nodal involvement known in no metastatic disease seen on staging scans. The patient has been seen by both surgical oncology and medical oncology and it is felt that preoperative treatment would be helpful for the patient. I believe that this is reasonable, and we therefore discussed preoperative chemoradiotherapy which has been discussed with the patient.  I discussed therefore a potential 5-1/2 week course of radiation treatment to be given with concurrent chemotherapy. We discussed the rationale of this treatment including the potential benefits. We also discussed the possible side effects and risks of treatment. All of the patient's questions were answered. He does wish to proceed with this treatment.   PLAN: The patient will be scheduled for a simulation as soon as this can be arranged. I anticipate beginning his radiation treatment next week, during the week of 10/31/2013.    I spent 60 minutes face to face with the patient and more than 50% of that time was spent in counseling and/or  coordination of care.    ________________________________   Radene Gunning, MD, PhD

## 2013-11-04 ENCOUNTER — Telehealth: Payer: Self-pay | Admitting: *Deleted

## 2013-11-04 NOTE — Telephone Encounter (Signed)
Message from pt reporting he received his chemo pills. When should he begin taking them? Instructed pt to begin chemo pills on 12/17, after chemo education. Xeloda is to be taken on days of radiation only. Pt voiced understanding.

## 2013-11-04 NOTE — Progress Notes (Signed)
RECEIVED A FAX FROM [facility] CONCERNING A CONFIRMATION OF PRESCRIPTION SHIPMENT FOR CAPECITABINE ON 11/03/13.

## 2013-11-07 ENCOUNTER — Encounter: Payer: Self-pay | Admitting: *Deleted

## 2013-11-07 NOTE — Progress Notes (Signed)
RECEIVED A FAX FROM [facility] CONCERNING A PATIENT UPDATE: INITIAL COUNSELING. WOULD LIKE PT.'S MEDICATION LIST FAXED TO PATIENT'S CARE TEAM SO THEY MAY CHECK FOR INTERACTIONS. PT.'S MEDICATION LIST WAS FAXED TO [facility].

## 2013-11-09 ENCOUNTER — Encounter: Payer: Self-pay | Admitting: *Deleted

## 2013-11-09 ENCOUNTER — Ambulatory Visit: Payer: Medicare Other | Admitting: Radiation Oncology

## 2013-11-09 ENCOUNTER — Other Ambulatory Visit: Payer: Self-pay | Admitting: *Deleted

## 2013-11-09 ENCOUNTER — Other Ambulatory Visit: Payer: Medicare Other

## 2013-11-09 DIAGNOSIS — Z510 Encounter for antineoplastic radiation therapy: Secondary | ICD-10-CM | POA: Diagnosis not present

## 2013-11-09 MED ORDER — PROCHLORPERAZINE MALEATE 5 MG PO TABS: 5.0000 mg | ORAL_TABLET | Freq: Four times a day (QID) | ORAL | Status: DC | PRN

## 2013-11-10 ENCOUNTER — Ambulatory Visit
Admission: RE | Admit: 2013-11-10 | Discharge: 2013-11-10 | Disposition: A | Discharge status: Home / Self Care | Payer: Medicare Other | Source: Ambulatory Visit | Attending: Radiation Oncology | Admitting: Radiation Oncology

## 2013-11-10 DIAGNOSIS — Z510 Encounter for antineoplastic radiation therapy: Secondary | ICD-10-CM | POA: Diagnosis not present

## 2013-11-10 DIAGNOSIS — C163 Malignant neoplasm of pyloric antrum: Secondary | ICD-10-CM

## 2013-11-11 ENCOUNTER — Ambulatory Visit
Admission: RE | Admit: 2013-11-11 | Discharge: 2013-11-11 | Disposition: A | Discharge status: Home / Self Care | Payer: Medicare Other | Source: Ambulatory Visit | Attending: Radiation Oncology | Admitting: Radiation Oncology

## 2013-11-11 ENCOUNTER — Encounter: Payer: Self-pay | Admitting: Radiation Oncology

## 2013-11-11 VITALS — BP 117/73 | HR 79 | Temp 97.8°F | Resp 20 | Wt 182.3 lb

## 2013-11-11 DIAGNOSIS — C163 Malignant neoplasm of pyloric antrum: Secondary | ICD-10-CM

## 2013-11-11 DIAGNOSIS — Z510 Encounter for antineoplastic radiation therapy: Secondary | ICD-10-CM | POA: Diagnosis not present

## 2013-11-11 MED ORDER — BIAFINE EX EMUL
CUTANEOUS | Status: DC | PRN
  Administered 2013-11-11: 19:00:00 via TOPICAL

## 2013-11-11 NOTE — Progress Notes (Signed)
Weekly rad txs 2/25 stomach , pt education done, rad book, biafine cream given, skin irritation, pain, nausea,vomiting,diarrhea, diet, pain, discussed, teach back given, no nausea pain at present, was slight dizzy at treatment table, none now, vitals wnl, energy level down some"but not bad"stated patient 6:53 PM

## 2013-11-11 NOTE — Progress Notes (Signed)
  Radiation Oncology         [phone] ________________________________  Name: Kyle Armstrong MRN: [id number]  Date: 11/10/2013  DOB: May 24, 1930  Simulation Verification Note   NARRATIVE: The patient was brought to the treatment unit and placed in the planned treatment position. The clinical setup was verified. Then port films were obtained and uploaded to the radiation oncology medical record software.  The treatment beams were carefully compared against the planned radiation fields. The position, location, and shape of the radiation fields was reviewed. The targeted volume of tissue appears to be appropriately covered by the radiation beams. Based on my personal review, I approved the simulation verification. The patient's treatment will proceed as planned.  ________________________________   Radene Gunning, MD, PhD

## 2013-11-11 NOTE — Progress Notes (Signed)
Department of Radiation Oncology  Phone:  [phone] Fax:        [phone]  Weekly Treatment Note    Name: [name] Date: 11/11/2013 MRN: [id number] DOB: [birth year]   Current dose: 3.6 Gy  Current fraction: 2   MEDICATIONS: Current Outpatient Prescriptions  Medication Sig Dispense Refill  . acetaminophen (TYLENOL) 500 MG tablet Take 500 mg by mouth every 6 (six) hours as needed.      . B Complex-C (SUPER B COMPLEX/VITAMIN C) TABS Take 1 tablet by mouth every other day.       . capecitabine (XELODA) 500 MG tablet Take 3 tablets (1,500 mg total) by mouth 2 (two) times daily after a meal. On days of radiation only (Mon-Fri)  150 tablet  0  . Cholecalciferol (VITAMIN D3) 2000 UNITS TABS Take by mouth daily.      . clobetasol ointment (TEMOVATE) 0.05 % Apply 1 application topically 2 (two) times daily. To affected areas on hands       . Cyanocobalamin (VITAMIN B-12 IJ) Inject as directed as directed. Every other month      . [START ON 11/14/2013] emollient (BIAFINE) cream Apply 1 application topically daily.      . ferrous fumarate (HEMOCYTE - 106 MG FE) 325 (106 FE) MG TABS Take 1 tablet by mouth daily.       . fish oil-omega-3 fatty acids 1000 MG capsule Take 2 g by mouth every other day.       . Glucose Blood (BAYER CONTOUR TEST VI) by In Vitro route. Check sugar daily.      Marland Kitchen levothyroxine (SYNTHROID, LEVOTHROID) 50 MCG tablet Take 50 mcg by mouth every morning.      . meclizine (ANTIVERT) 25 MG tablet Take 25 mg by mouth 3 (three) times daily as needed.       . Multiple Vitamin (MULTIVITAMIN WITH MINERALS) TABS Take 1 tablet by mouth every other day.       . neomycin-polymyxin-hydrocortisone (CORTISPORIN) 3.5-10000-1 otic suspension Place 4 drops into both ears 2 (two) times daily.      . pantoprazole (PROTONIX) 40 MG tablet Take 40 mg by mouth 2 (two) times daily.      . prochlorperazine (COMPAZINE) 5 MG tablet Take 1-2 tablets (5-10 mg total) by mouth every 6  (six) hours as needed for nausea or vomiting (*PLEASE NOTE-WILL CAUSE DROWSINESS).  30 tablet  1  . ranitidine (ZANTAC) 150 MG capsule Take 150 mg by mouth as needed.       . simvastatin (ZOCOR) 40 MG tablet Take 40 mg by mouth every evening.      . triamcinolone (NASACORT) 55 MCG/ACT nasal inhaler Place 2 sprays into the nose as needed.       . triamcinolone cream (KENALOG) 0.1 % Apply 1 application topically as needed. To affected areas on hands      . vitamin B-12 (CYANOCOBALAMIN) 100 MCG tablet Take 50 mcg by mouth every other day.        Current Facility-Administered Medications  Medication Dose Route Frequency Provider Last Rate Last Dose  . topical emolient (BIAFINE) emulsion   Topical PRN Jonna Coup, MD         ALLERGIES: Lipitor   LABORATORY DATA:  Lab Results  Component Value Date   WBC 7.0 07/09/2012   HGB 12.4* 07/09/2012   HCT 37.9* 07/09/2012   MCV 93.4 07/09/2012   PLT 181.0 07/09/2012   Lab Results  Component Value Date  NA 137 07/09/2012   K 4.6 07/09/2012   CL 103 07/09/2012   CO2 28 07/09/2012   Lab Results  Component Value Date   ALT 100* 04/30/2010   AST 86* 04/30/2010   ALKPHOS 51 04/30/2010   BILITOT 0.9 04/30/2010     NARRATIVE: Kyle Armstrong was seen today for weekly treatment management. The chart was checked and the patient's films were reviewed. The patient is doing well. No nausea with treatment thus far. Several questions answered.  PHYSICAL EXAMINATION: weight is 182 lb 4.8 oz (82.691 kg). His oral temperature is 97.8 F (36.6 C). His blood pressure is 117/73 and his pulse is 79. His respiration is 20.        ASSESSMENT: The patient is doing satisfactorily with treatment.  PLAN: We will continue with the patient's radiation treatment as planned.

## 2013-11-14 ENCOUNTER — Encounter: Payer: Self-pay | Admitting: *Deleted

## 2013-11-14 ENCOUNTER — Ambulatory Visit
Admission: RE | Admit: 2013-11-14 | Discharge: 2013-11-14 | Disposition: A | Discharge status: Home / Self Care | Payer: Medicare Other | Source: Ambulatory Visit | Attending: Radiation Oncology | Admitting: Radiation Oncology

## 2013-11-14 DIAGNOSIS — Z510 Encounter for antineoplastic radiation therapy: Secondary | ICD-10-CM | POA: Diagnosis not present

## 2013-11-14 NOTE — Progress Notes (Signed)
[facility] Psychosocial Distress Screening Clinical Social Work  Clinical Social Work was referred by distress screening protocol.  The patient scored a 6 on the Psychosocial Distress Thermometer which indicates moderate distress. Clinical Social Worker contacted pt at home to assess for distress and other psychosocial needs.  Pt stated he was doing "ok" aside from having recent headaches; which he plans to speak with his doctor about at his next appointment.  CSW and pt discussed the importance of support and CSW informed pt of the support team and support programs at [facility].  Pt was appreciative of contact, and CSW encouraged pt to call with any questions or concerns.   Tamala Julian, MSW, LCSW Clinical Social Worker [facility] [phone]

## 2013-11-15 ENCOUNTER — Ambulatory Visit
Admission: RE | Admit: 2013-11-15 | Discharge: 2013-11-15 | Disposition: A | Discharge status: Home / Self Care | Payer: Medicare Other | Source: Ambulatory Visit | Attending: Radiation Oncology | Admitting: Radiation Oncology

## 2013-11-15 DIAGNOSIS — Z510 Encounter for antineoplastic radiation therapy: Secondary | ICD-10-CM | POA: Diagnosis not present

## 2013-11-16 ENCOUNTER — Ambulatory Visit (HOSPITAL_BASED_OUTPATIENT_CLINIC_OR_DEPARTMENT_OTHER): Payer: Medicare Other | Admitting: Oncology

## 2013-11-16 ENCOUNTER — Ambulatory Visit
Admission: RE | Admit: 2013-11-16 | Discharge: 2013-11-16 | Disposition: A | Discharge status: Home / Self Care | Payer: Medicare Other | Source: Ambulatory Visit | Attending: Radiation Oncology | Admitting: Radiation Oncology

## 2013-11-16 ENCOUNTER — Telehealth: Payer: Self-pay | Admitting: Oncology

## 2013-11-16 ENCOUNTER — Other Ambulatory Visit (HOSPITAL_BASED_OUTPATIENT_CLINIC_OR_DEPARTMENT_OTHER): Payer: Medicare Other

## 2013-11-16 VITALS — BP 116/76 | HR 73 | Temp 98.4°F | Resp 18 | Ht 71.0 in | Wt 182.9 lb

## 2013-11-16 DIAGNOSIS — C169 Malignant neoplasm of stomach, unspecified: Secondary | ICD-10-CM

## 2013-11-16 DIAGNOSIS — R634 Abnormal weight loss: Secondary | ICD-10-CM

## 2013-11-16 DIAGNOSIS — Z87891 Personal history of nicotine dependence: Secondary | ICD-10-CM

## 2013-11-16 DIAGNOSIS — Z86010 Personal history of colonic polyps: Secondary | ICD-10-CM

## 2013-11-16 DIAGNOSIS — J8410 Pulmonary fibrosis, unspecified: Secondary | ICD-10-CM

## 2013-11-16 DIAGNOSIS — Z510 Encounter for antineoplastic radiation therapy: Secondary | ICD-10-CM | POA: Diagnosis not present

## 2013-11-16 DIAGNOSIS — K2940 Chronic atrophic gastritis without bleeding: Secondary | ICD-10-CM

## 2013-11-16 DIAGNOSIS — D649 Anemia, unspecified: Secondary | ICD-10-CM

## 2013-11-16 DIAGNOSIS — R630 Anorexia: Secondary | ICD-10-CM

## 2013-11-16 LAB — CBC WITH DIFFERENTIAL/PLATELET
BASO%: 0.8 % (ref 0.0–2.0)
EOS%: 6.8 % (ref 0.0–7.0)
LYMPH%: 7.9 % — ABNORMAL LOW (ref 14.0–49.0)
MCHC: 32.8 g/dL (ref 32.0–36.0)
MCV: 89.4 fL (ref 79.3–98.0)
MONO%: 10.7 % (ref 0.0–14.0)
Platelets: 177 10*3/uL (ref 140–400)
RBC: 3.44 10*6/uL — ABNORMAL LOW (ref 4.20–5.82)
RDW: 13.9 % (ref 11.0–14.6)

## 2013-11-16 LAB — COMPREHENSIVE METABOLIC PANEL (CC13)
ALT: 14 U/L (ref 0–55)
AST: 21 U/L (ref 5–34)
Alkaline Phosphatase: 62 U/L (ref 40–150)
Glucose: 82 mg/dl (ref 70–140)
Sodium: 136 mEq/L (ref 136–145)
Total Bilirubin: 0.65 mg/dL (ref 0.20–1.20)
Total Protein: 6.6 g/dL (ref 6.4–8.3)

## 2013-11-16 NOTE — Telephone Encounter (Signed)
appts made per 12/24 POF AVS and CAl given shh

## 2013-11-16 NOTE — Progress Notes (Signed)
   [facility]    OFFICE PROGRESS NOTE   INTERVAL HISTORY:   Kyle Armstrong returns as scheduled. He started radiation and concurrent capecitabine on 11/10/2013. He reports tolerating the treatment well today. He denies mouth sores, diarrhea, hand/foot pain, and bleeding. No complaint. He continues to work  Objective:  Vital signs in last 24 hours:  Blood pressure 116/76, pulse 73, temperature 98.4 F (36.9 C), temperature source Oral, resp. rate 18, height 5\' 11"  (1.803 m), weight 182 lb 14.4 oz (82.963 kg).    HEENT: No thrush or ulcers Resp: Inspiratory rales at the lower posterior chest bilaterally, scattered rhonchi, no respiratory distress Cardio: Regular rate and rhythm GI: Nontender, no hepatomegaly, no mass Vascular: The left lower leg is larger than the right side, no edema  Skin: Palms without erythema     Lab Results:  Lab Results  Component Value Date   WBC 4.2 11/16/2013   HGB 10.1* 11/16/2013   HCT 30.8* 11/16/2013   MCV 89.4 11/16/2013   PLT 177 11/16/2013   ANC 3.1    Medications: I have reviewed the patient's current medications.  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 HER-2/neu amplified  Staging chest CT negative on 10/27/2013 2. Diffuse "gastritis "change noted on endoscopies 08/19/2013 and 10/06/2013 with biopsies confirming atrophic gastritis. Low and high-grade dysplasia were noted on the biopsy 08/19/2013  3. Anemia-likely secondary to GI blood loss and chemotherapy/radiation, we will check a CBC in 2 weeks. 4. Anorexia/weight loss  5. History of colon polyp  6. History of "sarcoidosis " , pulmonary fibrosis noted on the chest CT 10/27/2013  7. History of tobacco use  8. History of arrhythmia, status post and ablation August 2013    Disposition:  Kyle Armstrong appears to be tolerating the treatment well. We  discussed the plan for a restaging evaluation after chemotherapy/radiation. We can then make a decision on surgery. He will contact us for diarrhea or hand/foot pain. Kyle Armstrong will return for an office visit on 12/05/2012.   Thornton Papas, MD  11/16/2013  12:10 PM

## 2013-11-18 ENCOUNTER — Encounter: Payer: Self-pay | Admitting: Radiation Oncology

## 2013-11-18 ENCOUNTER — Ambulatory Visit
Admission: RE | Admit: 2013-11-18 | Discharge: 2013-11-18 | Disposition: A | Discharge status: Home / Self Care | Payer: Medicare Other | Source: Ambulatory Visit | Attending: Radiation Oncology | Admitting: Radiation Oncology

## 2013-11-18 VITALS — BP 134/77 | HR 86 | Temp 97.6°F | Resp 20 | Wt 179.4 lb

## 2013-11-18 DIAGNOSIS — C163 Malignant neoplasm of pyloric antrum: Secondary | ICD-10-CM

## 2013-11-18 DIAGNOSIS — Z510 Encounter for antineoplastic radiation therapy: Secondary | ICD-10-CM | POA: Diagnosis not present

## 2013-11-18 NOTE — Progress Notes (Signed)
  Radiation Oncology         [phone] ________________________________  Name: Kyle Armstrong MRN: [id number]  Date: 11/18/2013  DOB: [birth year]  Weekly Radiation Therapy Management  Current Dose: 10.8 Gy     Planned Dose:  45 Gy  Narrative . . . . . . . . The patient presents for routine under treatment assessment.no nausea, no pain, small bm's soft, appetite fair, ate well yesterday, 2 piecses chees toast and 2 slices ham, drank pint milk and ham for lunch,  Energy level good                                   The patient is without complaint.                                 Set-up films were reviewed.                                 The chart was checked. Physical Findings. . .  weight is 179 lb 6.4 oz (81.375 kg). His oral temperature is 97.6 F (36.4 C). His blood pressure is 134/77 and his pulse is 86. His respiration is 20. . Weight essentially stable.  No significant changes. Impression . . . . . . . The patient is tolerating radiation. Plan . . . . . . . . . . . . Continue treatment as planned.  ________________________________  Artist Pais. Kathrynn Running, M.D.

## 2013-11-18 NOTE — Progress Notes (Signed)
weekly rad txs stomach completed, 6 completed , no nausea, no pain, small bm's soft, appetite fair, ate well yesterday, 2 piecses chees toast and 2 slices ham, drank pint milk and ham for lunch,  Energy level good 4:39 PM

## 2013-11-21 ENCOUNTER — Ambulatory Visit
Admission: RE | Admit: 2013-11-21 | Discharge: 2013-11-21 | Disposition: A | Discharge status: Home / Self Care | Payer: Medicare Other | Source: Ambulatory Visit | Attending: Radiation Oncology | Admitting: Radiation Oncology

## 2013-11-21 DIAGNOSIS — Z510 Encounter for antineoplastic radiation therapy: Secondary | ICD-10-CM | POA: Diagnosis not present

## 2013-11-22 ENCOUNTER — Ambulatory Visit
Admission: RE | Admit: 2013-11-22 | Discharge: 2013-11-22 | Disposition: A | Discharge status: Home / Self Care | Payer: Medicare Other | Source: Ambulatory Visit | Attending: Radiation Oncology | Admitting: Radiation Oncology

## 2013-11-22 DIAGNOSIS — Z510 Encounter for antineoplastic radiation therapy: Secondary | ICD-10-CM | POA: Diagnosis not present

## 2013-11-23 ENCOUNTER — Ambulatory Visit
Admission: RE | Admit: 2013-11-23 | Discharge: 2013-11-23 | Disposition: A | Discharge status: Home / Self Care | Payer: Medicare Other | Source: Ambulatory Visit | Attending: Radiation Oncology | Admitting: Radiation Oncology

## 2013-11-23 DIAGNOSIS — Z510 Encounter for antineoplastic radiation therapy: Secondary | ICD-10-CM | POA: Diagnosis not present

## 2013-11-24 DIAGNOSIS — R633 Feeding difficulties, unspecified: Secondary | ICD-10-CM | POA: Diagnosis not present

## 2013-11-24 DIAGNOSIS — Z510 Encounter for antineoplastic radiation therapy: Secondary | ICD-10-CM | POA: Diagnosis present

## 2013-11-24 DIAGNOSIS — R630 Anorexia: Secondary | ICD-10-CM | POA: Diagnosis not present

## 2013-11-24 DIAGNOSIS — C169 Malignant neoplasm of stomach, unspecified: Secondary | ICD-10-CM | POA: Diagnosis not present

## 2013-11-24 DIAGNOSIS — R110 Nausea: Secondary | ICD-10-CM | POA: Diagnosis not present

## 2013-11-24 DIAGNOSIS — Z79899 Other long term (current) drug therapy: Secondary | ICD-10-CM | POA: Diagnosis not present

## 2013-11-24 DIAGNOSIS — R1310 Dysphagia, unspecified: Secondary | ICD-10-CM | POA: Diagnosis not present

## 2013-11-25 ENCOUNTER — Encounter: Payer: Self-pay | Admitting: Radiation Oncology

## 2013-11-25 ENCOUNTER — Ambulatory Visit
Admission: RE | Admit: 2013-11-25 | Discharge: 2013-11-25 | Disposition: A | Discharge status: Home / Self Care | Payer: Medicare Other | Source: Ambulatory Visit | Attending: Radiation Oncology | Admitting: Radiation Oncology

## 2013-11-25 ENCOUNTER — Telehealth: Payer: Self-pay | Admitting: *Deleted

## 2013-11-25 VITALS — BP 96/61 | HR 73 | Temp 97.5°F | Resp 20 | Wt 178.0 lb

## 2013-11-25 DIAGNOSIS — C163 Malignant neoplasm of pyloric antrum: Secondary | ICD-10-CM

## 2013-11-25 DIAGNOSIS — Z510 Encounter for antineoplastic radiation therapy: Secondary | ICD-10-CM | POA: Diagnosis not present

## 2013-11-25 NOTE — Progress Notes (Signed)
Department of Radiation Oncology  Phone:  [phone] Fax:        [phone]  Weekly Treatment Note    Name: Kyle Armstrong Date: 11/25/2013 MRN: [id number] DOB: 27-Jul-1930   Current dose: 18 Gy  Current fraction: 10   MEDICATIONS: Current Outpatient Prescriptions  Medication Sig Dispense Refill  . acetaminophen (TYLENOL) 500 MG tablet Take 500 mg by mouth every 6 (six) hours as needed.      . B Complex-C (SUPER B COMPLEX/VITAMIN C) TABS Take 1 tablet by mouth every other day.       . capecitabine (XELODA) 500 MG tablet Take 3 tablets (1,500 mg total) by mouth 2 (two) times daily after a meal. On days of radiation only (Mon-Fri)  150 tablet  0  . Cholecalciferol (VITAMIN D3) 2000 UNITS TABS Take by mouth daily.      . clobetasol ointment (TEMOVATE) 9.17 % Apply 1 application topically 2 (two) times daily. To affected areas on hands       . Cyanocobalamin (VITAMIN B-12 IJ) Inject as directed as directed. Every other month      . emollient (BIAFINE) cream Apply 1 application topically daily.      . ferrous fumarate (HEMOCYTE - 106 MG FE) 325 (106 FE) MG TABS Take 1 tablet by mouth daily.       . fish oil-omega-3 fatty acids 1000 MG capsule Take 2 g by mouth every other day.       . Glucose Blood (BAYER CONTOUR TEST VI) by In Vitro route. Check sugar daily.      Marland Kitchen levothyroxine (SYNTHROID, LEVOTHROID) 50 MCG tablet Take 50 mcg by mouth every morning.      . meclizine (ANTIVERT) 25 MG tablet Take 25 mg by mouth 3 (three) times daily as needed.       . Multiple Vitamin (MULTIVITAMIN WITH MINERALS) TABS Take 1 tablet by mouth every other day.       . neomycin-polymyxin-hydrocortisone (CORTISPORIN) 3.5-10000-1 otic suspension Place 4 drops into both ears 2 (two) times daily.      . pantoprazole (PROTONIX) 40 MG tablet Take 40 mg by mouth 2 (two) times daily.      . prochlorperazine (COMPAZINE) 5 MG tablet Take 1-2 tablets (5-10 mg total) by mouth every 6 (six) hours as needed for  nausea or vomiting (*PLEASE NOTE-WILL CAUSE DROWSINESS).  30 tablet  1  . ranitidine (ZANTAC) 150 MG capsule Take 150 mg by mouth as needed.       . simvastatin (ZOCOR) 40 MG tablet Take 40 mg by mouth every evening.      . triamcinolone (NASACORT) 55 MCG/ACT nasal inhaler Place 2 sprays into the nose as needed.       . triamcinolone cream (KENALOG) 0.1 % Apply 1 application topically as needed. To affected areas on hands      . vitamin B-12 (CYANOCOBALAMIN) 100 MCG tablet Take 50 mcg by mouth every other day.        No current facility-administered medications for this encounter.     ALLERGIES: Lipitor   LABORATORY DATA:  Lab Results  Component Value Date   WBC 4.2 11/16/2013   HGB 10.1* 11/16/2013   HCT 30.8* 11/16/2013   MCV 89.4 11/16/2013   PLT 177 11/16/2013   Lab Results  Component Value Date   NA 136 11/16/2013   K 4.1 11/16/2013   CL 103 07/09/2012   CO2 23 11/16/2013   Lab Results  Component Value Date  ALT 14 11/16/2013   AST 21 11/16/2013   ALKPHOS 62 11/16/2013   BILITOT 0.65 11/16/2013     NARRATIVE: Kyle Armstrong was seen today for weekly treatment management. The chart was checked and the patient's films were reviewed. The patient states that he is doing well at this time other than some fatigue. No diarrhea. No nausea.  PHYSICAL EXAMINATION: weight is 178 lb (80.74 kg). His temperature is 97.5 F (36.4 C). His blood pressure is 96/61 and his pulse is 73. His respiration is 20.        ASSESSMENT: The patient is doing satisfactorily with treatment.  PLAN: We will continue with the patient's radiation treatment as planned.

## 2013-11-25 NOTE — Telephone Encounter (Signed)
Received fax -Capectabine shipped 11/23/13

## 2013-11-25 NOTE — Progress Notes (Signed)
Pt denies pain other than his "normal arthritis pains", denies nausea, bowel issues, loss of appetite. He is fatigued.

## 2013-11-28 ENCOUNTER — Ambulatory Visit
Admission: RE | Admit: 2013-11-28 | Discharge: 2013-11-28 | Disposition: A | Discharge status: Home / Self Care | Payer: Medicare Other | Source: Ambulatory Visit | Attending: Radiation Oncology | Admitting: Radiation Oncology

## 2013-11-28 DIAGNOSIS — Z510 Encounter for antineoplastic radiation therapy: Secondary | ICD-10-CM | POA: Diagnosis not present

## 2013-11-29 ENCOUNTER — Ambulatory Visit
Admission: RE | Admit: 2013-11-29 | Discharge: 2013-11-29 | Disposition: A | Discharge status: Home / Self Care | Payer: Medicare Other | Source: Ambulatory Visit | Attending: Radiation Oncology | Admitting: Radiation Oncology

## 2013-11-29 DIAGNOSIS — Z510 Encounter for antineoplastic radiation therapy: Secondary | ICD-10-CM | POA: Diagnosis not present

## 2013-11-30 ENCOUNTER — Other Ambulatory Visit (HOSPITAL_BASED_OUTPATIENT_CLINIC_OR_DEPARTMENT_OTHER): Payer: Medicare Other

## 2013-11-30 ENCOUNTER — Ambulatory Visit
Admission: RE | Admit: 2013-11-30 | Discharge: 2013-11-30 | Disposition: A | Discharge status: Home / Self Care | Payer: Medicare Other | Source: Ambulatory Visit | Attending: Radiation Oncology | Admitting: Radiation Oncology

## 2013-11-30 DIAGNOSIS — Z510 Encounter for antineoplastic radiation therapy: Secondary | ICD-10-CM | POA: Diagnosis not present

## 2013-11-30 DIAGNOSIS — C169 Malignant neoplasm of stomach, unspecified: Secondary | ICD-10-CM

## 2013-11-30 LAB — CBC WITH DIFFERENTIAL/PLATELET
BASO%: 0.6 % (ref 0.0–2.0)
BASOS ABS: 0 10*3/uL (ref 0.0–0.1)
EOS ABS: 0.4 10*3/uL (ref 0.0–0.5)
EOS%: 8.1 % — AB (ref 0.0–7.0)
HCT: 32.4 % — ABNORMAL LOW (ref 38.4–49.9)
HEMOGLOBIN: 10.7 g/dL — AB (ref 13.0–17.1)
LYMPH#: 0.2 10*3/uL — AB (ref 0.9–3.3)
LYMPH%: 4.8 % — ABNORMAL LOW (ref 14.0–49.0)
MCH: 30.5 pg (ref 27.2–33.4)
MCHC: 32.9 g/dL (ref 32.0–36.0)
MCV: 92.5 fL (ref 79.3–98.0)
MONO#: 0.5 10*3/uL (ref 0.1–0.9)
MONO%: 11 % (ref 0.0–14.0)
NEUT%: 75.5 % — ABNORMAL HIGH (ref 39.0–75.0)
NEUTROS ABS: 3.3 10*3/uL (ref 1.5–6.5)
Platelets: 147 10*3/uL (ref 140–400)
RBC: 3.51 10*6/uL — AB (ref 4.20–5.82)
RDW: 14.6 % (ref 11.0–14.6)
WBC: 4.4 10*3/uL (ref 4.0–10.3)

## 2013-12-01 ENCOUNTER — Ambulatory Visit
Admission: RE | Admit: 2013-12-01 | Discharge: 2013-12-01 | Disposition: A | Discharge status: Home / Self Care | Payer: Medicare Other | Source: Ambulatory Visit | Attending: Radiation Oncology | Admitting: Radiation Oncology

## 2013-12-01 DIAGNOSIS — Z510 Encounter for antineoplastic radiation therapy: Secondary | ICD-10-CM | POA: Diagnosis not present

## 2013-12-02 ENCOUNTER — Ambulatory Visit
Admission: RE | Admit: 2013-12-02 | Discharge: 2013-12-02 | Disposition: A | Discharge status: Home / Self Care | Payer: Medicare Other | Source: Ambulatory Visit | Attending: Radiation Oncology | Admitting: Radiation Oncology

## 2013-12-02 ENCOUNTER — Encounter: Payer: Self-pay | Admitting: Radiation Oncology

## 2013-12-02 VITALS — BP 97/64 | HR 74 | Temp 97.6°F | Resp 20 | Wt 177.9 lb

## 2013-12-02 DIAGNOSIS — Z510 Encounter for antineoplastic radiation therapy: Secondary | ICD-10-CM | POA: Diagnosis not present

## 2013-12-02 DIAGNOSIS — C163 Malignant neoplasm of pyloric antrum: Secondary | ICD-10-CM

## 2013-12-02 NOTE — Progress Notes (Addendum)
  Radiation Oncology         [phone] ________________________________  Name: Kyle Armstrong MRN: [id number]  Date: 11/02/2013  DOB: Sep 11, 1930  SIMULATION AND TREATMENT PLANNING NOTE  DIAGNOSIS:  Gastric cancer  NARRATIVE:  The patient was brought to the [facility].  Identity was confirmed.  All relevant records and images related to the planned course of therapy were reviewed.   Written consent to proceed with treatment was confirmed which was freely given after reviewing the details related to the planned course of therapy had been reviewed with the patient.  Then, the patient was set-up in a stable reproducible  supine position for radiation therapy.  CT images were obtained.  Surface markings were placed.    Medically necessary complex treatment device(s) for immobilization:  Customized VAC lock bag.   The CT images were loaded into the planning software.  Then the target and avoidance structures were contoured.  Treatment planning then occurred.  The radiation prescription was entered and confirmed.  A total of 5 complex treatment devices were fabricated which relate to the designed radiation treatment fields. Each of these customized fields/ complex treatment devices will be used on a daily basis during the radiation course. I have requested : 3D Simulation  I have requested a DVH of the following structures: Clinical target volume, liver, spinal cord, kidneys.   PLAN:  The patient will initially receive 45 Gy in 25 fractions.  The patient it is anticipated will then received a 5.4 gray boost for a total of 50.4 gray.   Special treatment procedure The patient will receive chemotherapy during the course of radiation treatment. The patient may experience increased or overlapping toxicity due to this combined-modality approach and the patient will be monitored for such problems. This may include extra lab work as necessary. This therefore constitutes a special treatment  procedure.  ________________________________   Jodelle Gross, MD, PhD

## 2013-12-02 NOTE — Progress Notes (Signed)
Department of Radiation Oncology  Phone:  [phone] Fax:        [phone]  Weekly Treatment Note    Name: Kyle Armstrong Date: 12/02/2013 MRN: [id number] DOB: 11-22-1930   Current dose: 27 Gy  Current fraction: 15   MEDICATIONS: Current Outpatient Prescriptions  Medication Sig Dispense Refill  . acetaminophen (TYLENOL) 500 MG tablet Take 500 mg by mouth every 6 (six) hours as needed.      . B Complex-C (SUPER B COMPLEX/VITAMIN C) TABS Take 1 tablet by mouth every other day.       . capecitabine (XELODA) 500 MG tablet Take 3 tablets (1,500 mg total) by mouth 2 (two) times daily after a meal. On days of radiation only (Mon-Fri)  150 tablet  0  . Cholecalciferol (VITAMIN D3) 2000 UNITS TABS Take by mouth daily.      . clobetasol ointment (TEMOVATE) 6.28 % Apply 1 application topically 2 (two) times daily. To affected areas on hands       . Cyanocobalamin (VITAMIN B-12 IJ) Inject as directed as directed. Every other month      . emollient (BIAFINE) cream Apply 1 application topically daily.      . ferrous fumarate (HEMOCYTE - 106 MG FE) 325 (106 FE) MG TABS Take 1 tablet by mouth daily.       . fish oil-omega-3 fatty acids 1000 MG capsule Take 2 g by mouth every other day.       . Glucose Blood (BAYER CONTOUR TEST VI) by In Vitro route. Check sugar daily.      Marland Kitchen levothyroxine (SYNTHROID, LEVOTHROID) 50 MCG tablet Take 50 mcg by mouth every morning.      . meclizine (ANTIVERT) 25 MG tablet Take 25 mg by mouth 3 (three) times daily as needed.       . Multiple Vitamin (MULTIVITAMIN WITH MINERALS) TABS Take 1 tablet by mouth every other day.       . neomycin-polymyxin-hydrocortisone (CORTISPORIN) 3.5-10000-1 otic suspension Place 4 drops into both ears 2 (two) times daily.      . pantoprazole (PROTONIX) 40 MG tablet Take 40 mg by mouth 2 (two) times daily.      . prochlorperazine (COMPAZINE) 5 MG tablet Take 1-2 tablets (5-10 mg total) by mouth every 6 (six) hours as needed for  nausea or vomiting (*PLEASE NOTE-WILL CAUSE DROWSINESS).  30 tablet  1  . ranitidine (ZANTAC) 150 MG capsule Take 150 mg by mouth as needed.       . simvastatin (ZOCOR) 40 MG tablet Take 40 mg by mouth every evening.      . triamcinolone (NASACORT) 55 MCG/ACT nasal inhaler Place 2 sprays into the nose as needed.       . triamcinolone cream (KENALOG) 0.1 % Apply 1 application topically as needed. To affected areas on hands      . vitamin B-12 (CYANOCOBALAMIN) 100 MCG tablet Take 50 mcg by mouth every other day.        No current facility-administered medications for this encounter.     ALLERGIES: Lipitor   LABORATORY DATA:  Lab Results  Component Value Date   WBC 4.4 11/30/2013   HGB 10.7* 11/30/2013   HCT 32.4* 11/30/2013   MCV 92.5 11/30/2013   PLT 147 11/30/2013   Lab Results  Component Value Date   NA 136 11/16/2013   K 4.1 11/16/2013   CL 103 07/09/2012   CO2 23 11/16/2013   Lab Results  Component Value Date  ALT 14 11/16/2013   AST 21 11/16/2013   ALKPHOS 62 11/16/2013   BILITOT 0.65 11/16/2013     NARRATIVE: Kyle Armstrong was seen today for weekly treatment management. The chart was checked and the patient's films were reviewed. The patient is doing well. No nausea. No difficulty with pain. Some occasional loose stools and fatigue.  PHYSICAL EXAMINATION: weight is 177 lb 14.4 oz (80.695 kg). His oral temperature is 97.6 F (36.4 C). His blood pressure is 97/64 and his pulse is 74. His respiration is 20.        ASSESSMENT: The patient is doing satisfactorily with treatment.  PLAN: We will continue with the patient's radiation treatment as planned.

## 2013-12-02 NOTE — Addendum Note (Signed)
Encounter addended by: Marye Round, MD on: 12/02/2013  3:41 PM<BR>     Documentation filed: Visit Diagnoses, Notes Section

## 2013-12-02 NOTE — Progress Notes (Signed)
Pt denies pain, nausea. He states he has loss of appetite, occasional loose stools, fatigue.

## 2013-12-05 ENCOUNTER — Other Ambulatory Visit: Payer: Medicare Other

## 2013-12-05 ENCOUNTER — Telehealth: Payer: Self-pay | Admitting: Oncology

## 2013-12-05 ENCOUNTER — Ambulatory Visit (HOSPITAL_BASED_OUTPATIENT_CLINIC_OR_DEPARTMENT_OTHER): Payer: Medicare Other | Admitting: Nurse Practitioner

## 2013-12-05 ENCOUNTER — Ambulatory Visit
Admission: RE | Admit: 2013-12-05 | Discharge: 2013-12-05 | Disposition: A | Discharge status: Home / Self Care | Payer: Medicare Other | Source: Ambulatory Visit | Attending: Radiation Oncology | Admitting: Radiation Oncology

## 2013-12-05 VITALS — BP 112/70 | HR 79 | Temp 98.1°F | Resp 18 | Ht 71.0 in | Wt 176.8 lb

## 2013-12-05 DIAGNOSIS — C169 Malignant neoplasm of stomach, unspecified: Secondary | ICD-10-CM

## 2013-12-05 DIAGNOSIS — D649 Anemia, unspecified: Secondary | ICD-10-CM

## 2013-12-05 DIAGNOSIS — Z510 Encounter for antineoplastic radiation therapy: Secondary | ICD-10-CM | POA: Diagnosis not present

## 2013-12-05 DIAGNOSIS — K2940 Chronic atrophic gastritis without bleeding: Secondary | ICD-10-CM

## 2013-12-05 NOTE — Progress Notes (Signed)
OFFICE PROGRESS NOTE  Interval history:  Kyle Armstrong returns for followup of gastric cancer. He continues radiation and Xeloda. He has a single mouth sore at the lower anterior gumline. The sore is intermittently painful. He denies nausea/vomiting. No diarrhea. No hand or foot pain or redness.   Objective: Filed Vitals:   12/05/13 1607  BP: 112/70  Pulse: 79  Temp: 98.1 F (36.7 C)  Resp: 18   Single small ulceration anterior lower gumline. Inspiratory rales at both lung bases. No respiratory distress. Regular cardiac rhythm. Abdomen soft and nontender. No hepatomegaly. Left lower leg is larger than the right lower leg. Palms without erythema. No skin breakdown.   Lab Results: Lab Results  Component Value Date   WBC 4.4 11/30/2013   HGB 10.7* 11/30/2013   HCT 32.4* 11/30/2013   MCV 92.5 11/30/2013   PLT 147 11/30/2013   NEUTROABS 3.3 11/30/2013    Chemistry:    Chemistry      Component Value Date/Time   NA 136 11/16/2013 1022   NA 137 07/09/2012 1000   K 4.1 11/16/2013 1022   K 4.6 07/09/2012 1000   CL 103 07/09/2012 1000   CO2 23 11/16/2013 1022   CO2 28 07/09/2012 1000   BUN 13.8 11/16/2013 1022   BUN 15 07/09/2012 1000   CREATININE 0.9 11/16/2013 1022   CREATININE 1.0 07/09/2012 1000      Component Value Date/Time   CALCIUM 9.2 11/16/2013 1022   CALCIUM 9.4 07/09/2012 1000   ALKPHOS 62 11/16/2013 1022   ALKPHOS 51 04/30/2010 0530   AST 21 11/16/2013 1022   AST 86* 04/30/2010 0530   ALT 14 11/16/2013 1022   ALT 100* 04/30/2010 0530   BILITOT 0.65 11/16/2013 1022   BILITOT 0.9 04/30/2010 0530       Studies/Results: No results found.  Medications: I have reviewed the patient's current medications.  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 HER-2/neu amplified  Staging chest CT negative on 10/27/2013. Initiation radiation and concurrent gemcitabine  11/10/2013. 2. Diffuse "gastritis "change noted on endoscopies 08/19/2013 and 10/06/2013 with biopsies confirming atrophic gastritis. Low and high-grade dysplasia were noted on the biopsy 08/19/2013  3. Anemia-likely secondary to GI blood loss and chemotherapy/radiation. Hemoglobin stable to slightly improved  11/30/2013. 4. Anorexia/weight loss.  5. History of colon polyp.  6. History of "sarcoidosis ", pulmonary fibrosis noted on the chest CT 10/27/2013.  7. History of tobacco use.  8. History of arrhythmia, status post and ablation August 2013.   Dispositon-he appears stable. He will continue radiation and Xeloda. The single mouth sore may be related to Xeloda. He will try Orajel as needed for discomfort. He is scheduled to complete the course of radiation on 12/21/2013. He will return for a followup visit on 12/26/2013. He will contact the office in the interim with any problems.  Plan reviewed with Dr. Benay Spice.   Ned Card ANP/GNP-BC

## 2013-12-05 NOTE — Telephone Encounter (Signed)
gv pt appt schedule for january and february.

## 2013-12-06 ENCOUNTER — Ambulatory Visit
Admission: RE | Admit: 2013-12-06 | Discharge: 2013-12-06 | Disposition: A | Discharge status: Home / Self Care | Payer: Medicare Other | Source: Ambulatory Visit | Attending: Radiation Oncology | Admitting: Radiation Oncology

## 2013-12-06 DIAGNOSIS — Z510 Encounter for antineoplastic radiation therapy: Secondary | ICD-10-CM | POA: Diagnosis not present

## 2013-12-07 ENCOUNTER — Telehealth: Payer: Self-pay | Admitting: *Deleted

## 2013-12-07 ENCOUNTER — Ambulatory Visit
Admission: RE | Admit: 2013-12-07 | Discharge: 2013-12-07 | Disposition: A | Discharge status: Home / Self Care | Payer: Medicare Other | Source: Ambulatory Visit | Attending: Radiation Oncology | Admitting: Radiation Oncology

## 2013-12-07 DIAGNOSIS — Z510 Encounter for antineoplastic radiation therapy: Secondary | ICD-10-CM | POA: Diagnosis not present

## 2013-12-07 NOTE — Telephone Encounter (Signed)
Left VM requesting patient check his bottle and let us know how many days supply he has on hand of his Xeloda so we can order the correct amount to get him through till his radiation is completed on 12/21/13. Will hold script at desk till we hear from him.

## 2013-12-08 ENCOUNTER — Ambulatory Visit
Admission: RE | Admit: 2013-12-08 | Discharge: 2013-12-08 | Disposition: A | Discharge status: Home / Self Care | Payer: Medicare Other | Source: Ambulatory Visit | Attending: Radiation Oncology | Admitting: Radiation Oncology

## 2013-12-08 DIAGNOSIS — Z510 Encounter for antineoplastic radiation therapy: Secondary | ICD-10-CM | POA: Diagnosis not present

## 2013-12-08 NOTE — Telephone Encounter (Signed)
Return call from patient that he currently has #7 days of chemo on hand. Will be #3 days short, but at last visit he discussed this with Dr. Benay Spice who told him he does not need to order these last 3 days of Xeloda. Faxed refill back to [facility] that therapy is completed-NO refill needed.

## 2013-12-09 ENCOUNTER — Encounter: Payer: Self-pay | Admitting: Radiation Oncology

## 2013-12-09 ENCOUNTER — Ambulatory Visit
Admission: RE | Admit: 2013-12-09 | Discharge: 2013-12-09 | Disposition: A | Discharge status: Home / Self Care | Payer: Medicare Other | Source: Ambulatory Visit | Attending: Radiation Oncology | Admitting: Radiation Oncology

## 2013-12-09 VITALS — BP 107/71 | HR 86 | Temp 98.3°F | Resp 20 | Wt 177.3 lb

## 2013-12-09 DIAGNOSIS — Z510 Encounter for antineoplastic radiation therapy: Secondary | ICD-10-CM | POA: Diagnosis not present

## 2013-12-09 DIAGNOSIS — C163 Malignant neoplasm of pyloric antrum: Secondary | ICD-10-CM

## 2013-12-09 NOTE — Progress Notes (Signed)
Weekly rad txs 20  Stomach completed, no c/o nausea, no diarrhea no pain, did have heart  Burn after eating popcorn shrimp yesterday,took zantac and it rtesolved,. Appetite so so 3:11 PM

## 2013-12-09 NOTE — Progress Notes (Signed)
Department of Radiation Oncology  Phone:  [phone] Fax:        [phone]  Weekly Treatment Note    Name: Kyle Armstrong Date: 12/09/2013 MRN: [id number] DOB: [birth year]   Current dose: 36 Gy  Current fraction: 20   MEDICATIONS: Current Outpatient Prescriptions  Medication Sig Dispense Refill  . acetaminophen (TYLENOL) 500 MG tablet Take 500 mg by mouth every 6 (six) hours as needed.      . B Complex-C (SUPER B COMPLEX/VITAMIN C) TABS Take 1 tablet by mouth every other day.       . capecitabine (XELODA) 500 MG tablet Take 3 tablets (1,500 mg total) by mouth 2 (two) times daily after a meal. On days of radiation only (Mon-Fri)  150 tablet  0  . Cholecalciferol (VITAMIN D3) 2000 UNITS TABS Take by mouth daily.      . clobetasol ointment (TEMOVATE) 0.24 % Apply 1 application topically 2 (two) times daily. To affected areas on hands       . Cyanocobalamin (VITAMIN B-12 IJ) Inject as directed as directed. Every other month      . emollient (BIAFINE) cream Apply 1 application topically daily.      . ferrous fumarate (HEMOCYTE - 106 MG FE) 325 (106 FE) MG TABS Take 1 tablet by mouth daily.       . fish oil-omega-3 fatty acids 1000 MG capsule Take 2 g by mouth every other day.       . Glucose Blood (BAYER CONTOUR TEST VI) by In Vitro route. Check sugar daily.      Marland Kitchen levothyroxine (SYNTHROID, LEVOTHROID) 50 MCG tablet Take 50 mcg by mouth every morning.      . meclizine (ANTIVERT) 25 MG tablet Take 25 mg by mouth 3 (three) times daily as needed.       . Multiple Vitamin (MULTIVITAMIN WITH MINERALS) TABS Take 1 tablet by mouth every other day.       . neomycin-polymyxin-hydrocortisone (CORTISPORIN) 3.5-10000-1 otic suspension Place 4 drops into both ears 2 (two) times daily.      . pantoprazole (PROTONIX) 40 MG tablet Take 40 mg by mouth 2 (two) times daily.      . prochlorperazine (COMPAZINE) 5 MG tablet Take 1-2 tablets (5-10 mg total) by mouth every 6 (six) hours as needed for  nausea or vomiting (*PLEASE NOTE-WILL CAUSE DROWSINESS).  30 tablet  1  . ranitidine (ZANTAC) 150 MG capsule Take 150 mg by mouth as needed.       . simvastatin (ZOCOR) 40 MG tablet Take 40 mg by mouth every evening.      . triamcinolone (NASACORT) 55 MCG/ACT nasal inhaler Place 2 sprays into the nose as needed.       . triamcinolone cream (KENALOG) 0.1 % Apply 1 application topically as needed. To affected areas on hands      . vitamin B-12 (CYANOCOBALAMIN) 100 MCG tablet Take 50 mcg by mouth every other day.        No current facility-administered medications for this encounter.     ALLERGIES: Lipitor   LABORATORY DATA:  Lab Results  Component Value Date   WBC 4.4 11/30/2013   HGB 10.7* 11/30/2013   HCT 32.4* 11/30/2013   MCV 92.5 11/30/2013   PLT 147 11/30/2013   Lab Results  Component Value Date   NA 136 11/16/2013   K 4.1 11/16/2013   CL 103 07/09/2012   CO2 23 11/16/2013   Lab Results  Component Value Date  ALT 14 11/16/2013   AST 21 11/16/2013   ALKPHOS 62 11/16/2013   BILITOT 0.65 11/16/2013     NARRATIVE: Kyle Armstrong was seen today for weekly treatment management. The chart was checked and the patient's films were reviewed. The patient is doing very well he states. No nausea. He had a little bit of heartburn yesterday which resolved with Zantac medication. Appetite is fair.  PHYSICAL EXAMINATION: weight is 177 lb 4.8 oz (80.423 kg). His oral temperature is 98.3 F (36.8 C). His blood pressure is 107/71 and his pulse is 86. His respiration is 20.        ASSESSMENT: The patient is doing satisfactorily with treatment.  PLAN: We will continue with the patient's radiation treatment as planned.

## 2013-12-09 NOTE — Progress Notes (Signed)
Patient dropped off FMLA paperwork.  Forward to RN for Dr. Lisbeth Renshaw.

## 2013-12-12 ENCOUNTER — Telehealth: Payer: Self-pay | Admitting: Dietician

## 2013-12-12 ENCOUNTER — Other Ambulatory Visit (HOSPITAL_BASED_OUTPATIENT_CLINIC_OR_DEPARTMENT_OTHER): Payer: Medicare Other

## 2013-12-12 ENCOUNTER — Ambulatory Visit
Admission: RE | Admit: 2013-12-12 | Discharge: 2013-12-12 | Disposition: A | Discharge status: Home / Self Care | Payer: Medicare Other | Source: Ambulatory Visit | Attending: Radiation Oncology | Admitting: Radiation Oncology

## 2013-12-12 DIAGNOSIS — C163 Malignant neoplasm of pyloric antrum: Secondary | ICD-10-CM

## 2013-12-12 DIAGNOSIS — C169 Malignant neoplasm of stomach, unspecified: Secondary | ICD-10-CM

## 2013-12-12 DIAGNOSIS — Z510 Encounter for antineoplastic radiation therapy: Secondary | ICD-10-CM | POA: Diagnosis not present

## 2013-12-12 LAB — CBC WITH DIFFERENTIAL/PLATELET
BASO%: 0.7 % (ref 0.0–2.0)
BASOS ABS: 0 10*3/uL (ref 0.0–0.1)
EOS ABS: 0.6 10*3/uL — AB (ref 0.0–0.5)
EOS%: 12.1 % — ABNORMAL HIGH (ref 0.0–7.0)
HCT: 32 % — ABNORMAL LOW (ref 38.4–49.9)
HEMOGLOBIN: 10.7 g/dL — AB (ref 13.0–17.1)
LYMPH%: 3.3 % — ABNORMAL LOW (ref 14.0–49.0)
MCH: 31.2 pg (ref 27.2–33.4)
MCHC: 33.4 g/dL (ref 32.0–36.0)
MCV: 93.7 fL (ref 79.3–98.0)
MONO#: 0.5 10*3/uL (ref 0.1–0.9)
MONO%: 10.4 % (ref 0.0–14.0)
NEUT%: 73.5 % (ref 39.0–75.0)
NEUTROS ABS: 3.4 10*3/uL (ref 1.5–6.5)
Platelets: 128 10*3/uL — ABNORMAL LOW (ref 140–400)
RBC: 3.42 10*6/uL — AB (ref 4.20–5.82)
RDW: 19.6 % — AB (ref 11.0–14.6)
WBC: 4.6 10*3/uL (ref 4.0–10.3)
lymph#: 0.2 10*3/uL — ABNORMAL LOW (ref 0.9–3.3)

## 2013-12-12 MED ORDER — BIAFINE EX EMUL
CUTANEOUS | Status: DC | PRN
  Administered 2013-12-12: 16:00:00 via TOPICAL

## 2013-12-12 NOTE — Telephone Encounter (Signed)
Brief Outpatient Oncology Nutrition Note  Patient has been identified to be at risk on malnutrition screen.  Wt Readings from Last 10 Encounters:  12/09/13 177 lb 4.8 oz (80.423 kg)  12/05/13 176 lb 12.8 oz (80.196 kg)  12/02/13 177 lb 14.4 oz (80.695 kg)  11/25/13 178 lb (80.74 kg)  11/18/13 179 lb 6.4 oz (81.375 kg)  11/16/13 182 lb 14.4 oz (82.963 kg)  11/11/13 182 lb 4.8 oz (82.691 kg)  10/31/13 182 lb 6.4 oz (82.736 kg)  10/27/13 181 lb 14.4 oz (82.509 kg)  10/24/13 184 lb 9.6 oz (83.734 kg)    Dx:  Gastric Cancer  Called patient secondary to weight loss in the past month.  Patient was not available.  Contact information for [place] [address] provided.  Antonieta Iba, RD, LDN

## 2013-12-13 ENCOUNTER — Ambulatory Visit
Admission: RE | Admit: 2013-12-13 | Discharge: 2013-12-13 | Disposition: A | Discharge status: Home / Self Care | Payer: Medicare Other | Source: Ambulatory Visit | Attending: Radiation Oncology | Admitting: Radiation Oncology

## 2013-12-13 DIAGNOSIS — Z510 Encounter for antineoplastic radiation therapy: Secondary | ICD-10-CM | POA: Diagnosis not present

## 2013-12-14 ENCOUNTER — Ambulatory Visit
Admission: RE | Admit: 2013-12-14 | Discharge: 2013-12-14 | Disposition: A | Discharge status: Home / Self Care | Payer: Medicare Other | Source: Ambulatory Visit | Attending: Radiation Oncology | Admitting: Radiation Oncology

## 2013-12-14 DIAGNOSIS — Z510 Encounter for antineoplastic radiation therapy: Secondary | ICD-10-CM | POA: Diagnosis not present

## 2013-12-15 ENCOUNTER — Encounter: Payer: Self-pay | Admitting: Radiation Oncology

## 2013-12-15 ENCOUNTER — Ambulatory Visit
Admission: RE | Admit: 2013-12-15 | Discharge: 2013-12-15 | Disposition: A | Discharge status: Home / Self Care | Payer: Medicare Other | Source: Ambulatory Visit | Attending: Radiation Oncology | Admitting: Radiation Oncology

## 2013-12-15 DIAGNOSIS — Z510 Encounter for antineoplastic radiation therapy: Secondary | ICD-10-CM | POA: Diagnosis not present

## 2013-12-15 NOTE — Progress Notes (Signed)
FMLA paperwork completed by Dr. Lisbeth Renshaw.  Called patient and let him know they would be in an envelope on his tx machine to pick up today.

## 2013-12-16 ENCOUNTER — Ambulatory Visit: Payer: Medicare Other

## 2013-12-16 ENCOUNTER — Ambulatory Visit
Admission: RE | Admit: 2013-12-16 | Discharge: 2013-12-16 | Disposition: A | Discharge status: Home / Self Care | Payer: Medicare Other | Source: Ambulatory Visit | Attending: Radiation Oncology | Admitting: Radiation Oncology

## 2013-12-16 VITALS — BP 117/71 | HR 90 | Temp 97.5°F | Ht 71.0 in | Wt 176.3 lb

## 2013-12-16 DIAGNOSIS — C163 Malignant neoplasm of pyloric antrum: Secondary | ICD-10-CM

## 2013-12-16 DIAGNOSIS — Z510 Encounter for antineoplastic radiation therapy: Secondary | ICD-10-CM | POA: Diagnosis not present

## 2013-12-16 NOTE — Progress Notes (Signed)
Department of Radiation Oncology  Phone:  [phone] Fax:        [phone]  Weekly Treatment Note    Name: RUMEAL CULLIPHER Date: 12/16/2013 MRN: [id number] DOB: 08-02-1930   Current dose: 45 Gy  Current fraction: 25   MEDICATIONS: Current Outpatient Prescriptions  Medication Sig Dispense Refill  . acetaminophen (TYLENOL) 500 MG tablet Take 500 mg by mouth every 6 (six) hours as needed.      . capecitabine (XELODA) 500 MG tablet Take 3 tablets (1,500 mg total) by mouth 2 (two) times daily after a meal. On days of radiation only (Mon-Fri)  150 tablet  0  . clobetasol ointment (TEMOVATE) 6.57 % Apply 1 application topically 2 (two) times daily. To affected areas on hands       . Cyanocobalamin (VITAMIN B-12 IJ) Inject as directed as directed. Every other month      . emollient (BIAFINE) cream Apply 1 application topically daily. 2nd tube given 12/12/13      . Glucose Blood (BAYER CONTOUR TEST VI) by In Vitro route. Check sugar daily.      Marland Kitchen levothyroxine (SYNTHROID, LEVOTHROID) 50 MCG tablet Take 50 mcg by mouth every morning.      . meclizine (ANTIVERT) 25 MG tablet Take 25 mg by mouth 3 (three) times daily as needed.       . neomycin-polymyxin-hydrocortisone (CORTISPORIN) 3.5-10000-1 otic suspension Place 4 drops into both ears 2 (two) times daily.      . pantoprazole (PROTONIX) 40 MG tablet Take 40 mg by mouth 2 (two) times daily.      . prochlorperazine (COMPAZINE) 5 MG tablet Take 1-2 tablets (5-10 mg total) by mouth every 6 (six) hours as needed for nausea or vomiting (*PLEASE NOTE-WILL CAUSE DROWSINESS).  30 tablet  1  . ranitidine (ZANTAC) 150 MG capsule Take 150 mg by mouth as needed.       . simvastatin (ZOCOR) 40 MG tablet Take 40 mg by mouth every evening.      . triamcinolone (NASACORT) 55 MCG/ACT nasal inhaler Place 2 sprays into the nose as needed.       . triamcinolone cream (KENALOG) 0.1 % Apply 1 application topically as needed. To affected areas on hands        . B Complex-C (SUPER B COMPLEX/VITAMIN C) TABS Take 1 tablet by mouth every other day.       . Cholecalciferol (VITAMIN D3) 2000 UNITS TABS Take by mouth daily.      . ferrous fumarate (HEMOCYTE - 106 MG FE) 325 (106 FE) MG TABS Take 1 tablet by mouth daily.       . fish oil-omega-3 fatty acids 1000 MG capsule Take 2 g by mouth every other day.       . Multiple Vitamin (MULTIVITAMIN WITH MINERALS) TABS Take 1 tablet by mouth every other day.       . vitamin B-12 (CYANOCOBALAMIN) 100 MCG tablet Take 50 mcg by mouth every other day.        No current facility-administered medications for this encounter.     ALLERGIES: Lipitor   LABORATORY DATA:  Lab Results  Component Value Date   WBC 4.6 12/12/2013   HGB 10.7* 12/12/2013   HCT 32.0* 12/12/2013   MCV 93.7 12/12/2013   PLT 128* 12/12/2013   Lab Results  Component Value Date   NA 136 11/16/2013   K 4.1 11/16/2013   CL 103 07/09/2012   CO2 23 11/16/2013   Lab Results  Component Value Date   ALT 14 11/16/2013   AST 21 11/16/2013   ALKPHOS 62 11/16/2013   BILITOT 0.65 11/16/2013     NARRATIVE: [name] was seen today for weekly treatment management. The chart was checked and the patient's films were reviewed. The patient continues to do quite well. He denies any nausea or diarrhea. He does complain of some fatigue.  PHYSICAL EXAMINATION: height is 5\' 11"  (1.803 m) and weight is 176 lb 4.8 oz (79.969 kg). His temperature is 97.5 F (36.4 C). His blood pressure is 117/71 and his pulse is 90. His oxygen saturation is 98%.        ASSESSMENT: The patient is doing satisfactorily with treatment.  PLAN: We will continue with the patient's radiation treatment as planned.

## 2013-12-16 NOTE — Progress Notes (Signed)
Kyle Armstrong has had 25 fractions to his stomach.  He is currently taking xeloda.  He denies pain, nausea and diarrhea.  He reports fatigue.  He reports that it feels like his food starts to come back up when he eats.  He reports a poor appetite.  He reports that his skin is itching on his abdomen and middle back.  The skin on his abdomen is slightly red with a scattered, raised rash.  He is using biafine cream and cortisone 10 cream.

## 2013-12-19 ENCOUNTER — Ambulatory Visit
Admission: RE | Admit: 2013-12-19 | Discharge: 2013-12-19 | Disposition: A | Discharge status: Home / Self Care | Payer: Medicare Other | Source: Ambulatory Visit | Attending: Radiation Oncology | Admitting: Radiation Oncology

## 2013-12-19 ENCOUNTER — Telehealth: Payer: Self-pay | Admitting: Dietician

## 2013-12-19 ENCOUNTER — Other Ambulatory Visit (HOSPITAL_BASED_OUTPATIENT_CLINIC_OR_DEPARTMENT_OTHER): Payer: Medicare Other

## 2013-12-19 ENCOUNTER — Ambulatory Visit: Payer: Medicare Other

## 2013-12-19 DIAGNOSIS — C169 Malignant neoplasm of stomach, unspecified: Secondary | ICD-10-CM

## 2013-12-19 DIAGNOSIS — Z510 Encounter for antineoplastic radiation therapy: Secondary | ICD-10-CM | POA: Diagnosis not present

## 2013-12-19 LAB — CBC WITH DIFFERENTIAL/PLATELET
BASO%: 0.7 % (ref 0.0–2.0)
BASOS ABS: 0 10*3/uL (ref 0.0–0.1)
EOS ABS: 0.2 10*3/uL (ref 0.0–0.5)
EOS%: 5.4 % (ref 0.0–7.0)
HEMATOCRIT: 32.7 % — AB (ref 38.4–49.9)
HEMOGLOBIN: 10.9 g/dL — AB (ref 13.0–17.1)
LYMPH%: 2.9 % — AB (ref 14.0–49.0)
MCH: 31.2 pg (ref 27.2–33.4)
MCHC: 33.3 g/dL (ref 32.0–36.0)
MCV: 93.8 fL (ref 79.3–98.0)
MONO#: 0.4 10*3/uL (ref 0.1–0.9)
MONO%: 10.2 % (ref 0.0–14.0)
NEUT%: 80.8 % — AB (ref 39.0–75.0)
NEUTROS ABS: 3.3 10*3/uL (ref 1.5–6.5)
PLATELETS: 153 10*3/uL (ref 140–400)
RBC: 3.49 10*6/uL — AB (ref 4.20–5.82)
RDW: 21.6 % — ABNORMAL HIGH (ref 11.0–14.6)
WBC: 4.1 10*3/uL (ref 4.0–10.3)
lymph#: 0.1 10*3/uL — ABNORMAL LOW (ref 0.9–3.3)

## 2013-12-19 NOTE — Telephone Encounter (Signed)
Brief Outpatient Oncology Nutrition Note  Patient has been identified to be at risk on malnutrition screen.  Wt Readings from Last 10 Encounters:  12/16/13 176 lb 4.8 oz (79.969 kg)  12/09/13 177 lb 4.8 oz (80.423 kg)  12/05/13 176 lb 12.8 oz (80.196 kg)  12/02/13 177 lb 14.4 oz (80.695 kg)  11/25/13 178 lb (80.74 kg)  11/18/13 179 lb 6.4 oz (81.375 kg)  11/16/13 182 lb 14.4 oz (82.963 kg)  11/11/13 182 lb 4.8 oz (82.691 kg)  10/31/13 182 lb 6.4 oz (82.736 kg)  10/27/13 181 lb 14.4 oz (82.509 kg)   Dx:  Gastric cancer  Called patient due to weight loss.  Patient is currently not available.  Message left instructing patient to call for any nutrition related questions or concerns.  [place] [facility]'s number provided with name and number.  Antonieta Iba, RD, LDN

## 2013-12-20 ENCOUNTER — Ambulatory Visit
Admission: RE | Admit: 2013-12-20 | Discharge: 2013-12-20 | Disposition: A | Discharge status: Home / Self Care | Payer: Medicare Other | Source: Ambulatory Visit | Attending: Radiation Oncology | Admitting: Radiation Oncology

## 2013-12-20 DIAGNOSIS — Z510 Encounter for antineoplastic radiation therapy: Secondary | ICD-10-CM | POA: Diagnosis not present

## 2013-12-21 ENCOUNTER — Ambulatory Visit
Admission: RE | Admit: 2013-12-21 | Discharge: 2013-12-21 | Disposition: A | Discharge status: Home / Self Care | Payer: Medicare Other | Source: Ambulatory Visit | Attending: Radiation Oncology | Admitting: Radiation Oncology

## 2013-12-21 ENCOUNTER — Encounter: Payer: Self-pay | Admitting: Radiation Oncology

## 2013-12-21 VITALS — BP 121/75 | HR 90 | Resp 16 | Wt 174.1 lb

## 2013-12-21 DIAGNOSIS — Z510 Encounter for antineoplastic radiation therapy: Secondary | ICD-10-CM | POA: Diagnosis not present

## 2013-12-21 DIAGNOSIS — C163 Malignant neoplasm of pyloric antrum: Secondary | ICD-10-CM

## 2013-12-21 NOTE — Progress Notes (Signed)
Patient completed final radiation treatment today. Provided patient with one month follow up appointment card. Surgery date has not been set yet. Weight stable. Denies nausea, vomiting, diarrhea or constipation. Patient reports difficulty swallowing x 2 weeks that prevents him for eating as much as he would like to. Reports stomach cramping last night following soup.

## 2013-12-21 NOTE — Progress Notes (Signed)
Department of Radiation Oncology  Phone:  [phone] Fax:        [phone]  Weekly Treatment Note    Name: Kyle Armstrong Date: 12/21/2013 MRN: [id number] DOB: 09-Jan-1930   Current dose: 50.4 Gy  Current fraction: 28   MEDICATIONS: Current Outpatient Prescriptions  Medication Sig Dispense Refill  . acetaminophen (TYLENOL) 500 MG tablet Take 500 mg by mouth every 6 (six) hours as needed.      . B Complex-C (SUPER B COMPLEX/VITAMIN C) TABS Take 1 tablet by mouth every other day.       . capecitabine (XELODA) 500 MG tablet Take 3 tablets (1,500 mg total) by mouth 2 (two) times daily after a meal. On days of radiation only (Mon-Fri)  150 tablet  0  . Cholecalciferol (VITAMIN D3) 2000 UNITS TABS Take by mouth daily.      . clobetasol ointment (TEMOVATE) 8.50 % Apply 1 application topically 2 (two) times daily. To affected areas on hands       . Cyanocobalamin (VITAMIN B-12 IJ) Inject as directed as directed. Every other month      . emollient (BIAFINE) cream Apply 1 application topically daily. 2nd tube given 12/12/13      . ferrous fumarate (HEMOCYTE - 106 MG FE) 325 (106 FE) MG TABS Take 1 tablet by mouth daily.       . fish oil-omega-3 fatty acids 1000 MG capsule Take 2 g by mouth every other day.       . Glucose Blood (BAYER CONTOUR TEST VI) by In Vitro route. Check sugar daily.      Marland Kitchen levothyroxine (SYNTHROID, LEVOTHROID) 50 MCG tablet Take 50 mcg by mouth every morning.      . meclizine (ANTIVERT) 25 MG tablet Take 25 mg by mouth 3 (three) times daily as needed.       . Multiple Vitamin (MULTIVITAMIN WITH MINERALS) TABS Take 1 tablet by mouth every other day.       . neomycin-polymyxin-hydrocortisone (CORTISPORIN) 3.5-10000-1 otic suspension Place 4 drops into both ears 2 (two) times daily.      . pantoprazole (PROTONIX) 40 MG tablet Take 40 mg by mouth 2 (two) times daily.      . prochlorperazine (COMPAZINE) 5 MG tablet Take 1-2 tablets (5-10 mg total) by mouth every 6  (six) hours as needed for nausea or vomiting (*PLEASE NOTE-WILL CAUSE DROWSINESS).  30 tablet  1  . ranitidine (ZANTAC) 150 MG capsule Take 150 mg by mouth as needed.       . simvastatin (ZOCOR) 40 MG tablet Take 40 mg by mouth every evening.      . triamcinolone (NASACORT) 55 MCG/ACT nasal inhaler Place 2 sprays into the nose as needed.       . triamcinolone cream (KENALOG) 0.1 % Apply 1 application topically as needed. To affected areas on hands      . vitamin B-12 (CYANOCOBALAMIN) 100 MCG tablet Take 50 mcg by mouth every other day.        No current facility-administered medications for this encounter.     ALLERGIES: Lipitor   LABORATORY DATA:  Lab Results  Component Value Date   WBC 4.1 12/19/2013   HGB 10.9* 12/19/2013   HCT 32.7* 12/19/2013   MCV 93.8 12/19/2013   PLT 153 12/19/2013   Lab Results  Component Value Date   NA 136 11/16/2013   K 4.1 11/16/2013   CL 103 07/09/2012   CO2 23 11/16/2013   Lab Results  Component  Value Date   ALT 14 11/16/2013   AST 21 11/16/2013   ALKPHOS 62 11/16/2013   BILITOT 0.65 11/16/2013     NARRATIVE: Kyle Armstrong was seen today for weekly treatment management. The chart was checked and the patient's films were reviewed. The patient continues to do quite well. He notes some difficulty eating large meals and some difficulty swallowing. No real complaints of esophagitis at this time but rather decreased appetite as one of his primary complaints. A little bit of nausea over the last week which was well controlled.  PHYSICAL EXAMINATION: weight is 174 lb 1.6 oz (78.971 kg). His blood pressure is 121/75 and his pulse is 90. His respiration is 16.        ASSESSMENT: The patient did satisfactorily with treatment.  PLAN: The patient will follow-up in our clinic in 1 month.

## 2013-12-22 ENCOUNTER — Ambulatory Visit: Payer: Medicare Other | Admitting: Radiation Oncology

## 2013-12-23 ENCOUNTER — Other Ambulatory Visit: Payer: Self-pay | Admitting: *Deleted

## 2013-12-23 DIAGNOSIS — C169 Malignant neoplasm of stomach, unspecified: Secondary | ICD-10-CM

## 2013-12-25 ENCOUNTER — Encounter (HOSPITAL_COMMUNITY): Payer: Self-pay | Admitting: Emergency Medicine

## 2013-12-25 ENCOUNTER — Emergency Department (HOSPITAL_COMMUNITY): Payer: Medicare Other

## 2013-12-25 ENCOUNTER — Emergency Department (HOSPITAL_COMMUNITY)
Admission: EM | Admit: 2013-12-25 | Discharge: 2013-12-25 | Disposition: A | Discharge status: Home / Self Care | Payer: Medicare Other | Attending: Emergency Medicine | Admitting: Emergency Medicine

## 2013-12-25 DIAGNOSIS — R9431 Abnormal electrocardiogram [ECG] [EKG]: Secondary | ICD-10-CM

## 2013-12-25 DIAGNOSIS — R42 Dizziness and giddiness: Secondary | ICD-10-CM | POA: Insufficient documentation

## 2013-12-25 DIAGNOSIS — M129 Arthropathy, unspecified: Secondary | ICD-10-CM | POA: Insufficient documentation

## 2013-12-25 DIAGNOSIS — R110 Nausea: Secondary | ICD-10-CM | POA: Insufficient documentation

## 2013-12-25 DIAGNOSIS — Z79899 Other long term (current) drug therapy: Secondary | ICD-10-CM | POA: Insufficient documentation

## 2013-12-25 DIAGNOSIS — Z8679 Personal history of other diseases of the circulatory system: Secondary | ICD-10-CM | POA: Insufficient documentation

## 2013-12-25 DIAGNOSIS — R630 Anorexia: Secondary | ICD-10-CM | POA: Insufficient documentation

## 2013-12-25 DIAGNOSIS — R059 Cough, unspecified: Secondary | ICD-10-CM | POA: Insufficient documentation

## 2013-12-25 DIAGNOSIS — R5381 Other malaise: Secondary | ICD-10-CM | POA: Insufficient documentation

## 2013-12-25 DIAGNOSIS — E039 Hypothyroidism, unspecified: Secondary | ICD-10-CM | POA: Insufficient documentation

## 2013-12-25 DIAGNOSIS — E785 Hyperlipidemia, unspecified: Secondary | ICD-10-CM | POA: Insufficient documentation

## 2013-12-25 DIAGNOSIS — R231 Pallor: Secondary | ICD-10-CM | POA: Insufficient documentation

## 2013-12-25 DIAGNOSIS — E860 Dehydration: Secondary | ICD-10-CM

## 2013-12-25 DIAGNOSIS — R05 Cough: Secondary | ICD-10-CM | POA: Insufficient documentation

## 2013-12-25 DIAGNOSIS — R0989 Other specified symptoms and signs involving the circulatory and respiratory systems: Secondary | ICD-10-CM | POA: Insufficient documentation

## 2013-12-25 DIAGNOSIS — E876 Hypokalemia: Secondary | ICD-10-CM

## 2013-12-25 DIAGNOSIS — Z87891 Personal history of nicotine dependence: Secondary | ICD-10-CM | POA: Insufficient documentation

## 2013-12-25 DIAGNOSIS — Z862 Personal history of diseases of the blood and blood-forming organs and certain disorders involving the immune mechanism: Secondary | ICD-10-CM | POA: Insufficient documentation

## 2013-12-25 DIAGNOSIS — C169 Malignant neoplasm of stomach, unspecified: Secondary | ICD-10-CM

## 2013-12-25 DIAGNOSIS — R5383 Other fatigue: Secondary | ICD-10-CM

## 2013-12-25 LAB — COMPREHENSIVE METABOLIC PANEL
ALT: 12 U/L (ref 0–53)
AST: 25 U/L (ref 0–37)
Albumin: 3 g/dL — ABNORMAL LOW (ref 3.5–5.2)
Alkaline Phosphatase: 72 U/L (ref 39–117)
BUN: 13 mg/dL (ref 6–23)
CO2: 24 meq/L (ref 19–32)
CREATININE: 0.89 mg/dL (ref 0.50–1.35)
Calcium: 8.8 mg/dL (ref 8.4–10.5)
Chloride: 96 mEq/L (ref 96–112)
GFR, EST NON AFRICAN AMERICAN: 78 mL/min — AB (ref >90)
GLUCOSE: 141 mg/dL — AB (ref 70–99)
Potassium: 3.1 mEq/L — ABNORMAL LOW (ref 3.7–5.3)
SODIUM: 134 meq/L — AB (ref 137–147)
TOTAL PROTEIN: 6.3 g/dL (ref 6.0–8.3)
Total Bilirubin: 0.4 mg/dL (ref 0.3–1.2)

## 2013-12-25 LAB — CBC WITH DIFFERENTIAL/PLATELET
BASOS ABS: 0 10*3/uL (ref 0.0–0.1)
BASOS PCT: 1 % (ref 0–1)
Eosinophils Absolute: 0.1 10*3/uL (ref 0.0–0.7)
Eosinophils Relative: 1 % (ref 0–5)
HCT: 31.7 % — ABNORMAL LOW (ref 39.0–52.0)
Hemoglobin: 10.9 g/dL — ABNORMAL LOW (ref 13.0–17.0)
LYMPHS PCT: 4 % — AB (ref 12–46)
Lymphs Abs: 0.1 10*3/uL — ABNORMAL LOW (ref 0.7–4.0)
MCH: 31.2 pg (ref 26.0–34.0)
MCHC: 34.4 g/dL (ref 30.0–36.0)
MCV: 90.8 fL (ref 78.0–100.0)
MONOS PCT: 10 % (ref 3–12)
Monocytes Absolute: 0.4 10*3/uL (ref 0.1–1.0)
NEUTROS PCT: 85 % — AB (ref 43–77)
Neutro Abs: 3.1 10*3/uL (ref 1.7–7.7)
Platelets: 131 10*3/uL — ABNORMAL LOW (ref 150–400)
RBC: 3.49 MIL/uL — ABNORMAL LOW (ref 4.22–5.81)
RDW: 19.7 % — ABNORMAL HIGH (ref 11.5–15.5)
WBC: 3.6 10*3/uL — ABNORMAL LOW (ref 4.0–10.5)

## 2013-12-25 LAB — URINALYSIS, ROUTINE W REFLEX MICROSCOPIC
BILIRUBIN URINE: NEGATIVE
GLUCOSE, UA: NEGATIVE mg/dL
Hgb urine dipstick: NEGATIVE
Ketones, ur: NEGATIVE mg/dL
Leukocytes, UA: NEGATIVE
Nitrite: NEGATIVE
PROTEIN: NEGATIVE mg/dL
Specific Gravity, Urine: 1.014 (ref 1.005–1.030)
UROBILINOGEN UA: 1 mg/dL (ref 0.0–1.0)
pH: 7 (ref 5.0–8.0)

## 2013-12-25 LAB — LACTIC ACID, PLASMA: Lactic Acid, Venous: 1.3 mmol/L (ref 0.5–2.2)

## 2013-12-25 LAB — TROPONIN I: Troponin I: <0.3 ng/mL (ref <0.30)

## 2013-12-25 LAB — MAGNESIUM: MAGNESIUM: 1.9 mg/dL (ref 1.5–2.5)

## 2013-12-25 MED ORDER — ONDANSETRON HCL 4 MG/2ML IJ SOLN
4.0000 mg | Freq: Once | INTRAMUSCULAR | Status: AC
  Administered 2013-12-25: 4 mg via INTRAVENOUS
  Filled 2013-12-25: qty 2

## 2013-12-25 MED ORDER — SODIUM CHLORIDE 0.9 % IV BOLUS (SEPSIS)
1000.0000 mL | Freq: Once | INTRAVENOUS | Status: AC
  Administered 2013-12-25: 1000 mL via INTRAVENOUS

## 2013-12-25 MED ORDER — POTASSIUM CHLORIDE CRYS ER 20 MEQ PO TBCR
40.0000 meq | EXTENDED_RELEASE_TABLET | Freq: Once | ORAL | Status: AC
  Administered 2013-12-25: 40 meq via ORAL
  Filled 2013-12-25: qty 2

## 2013-12-25 MED ORDER — POTASSIUM CHLORIDE 10 MEQ/100ML IV SOLN
10.0000 meq | Freq: Once | INTRAVENOUS | Status: AC
  Administered 2013-12-25: 10 meq via INTRAVENOUS
  Filled 2013-12-25: qty 100

## 2013-12-25 MED ORDER — POTASSIUM CHLORIDE CRYS ER 20 MEQ PO TBCR: 20.0000 meq | EXTENDED_RELEASE_TABLET | Freq: Every day | ORAL | Status: DC

## 2013-12-25 NOTE — Discharge Instructions (Signed)
If you were given medicines take as directed.  If you are on coumadin or contraceptives realize their levels and effectiveness is altered by many different medicines.  If you have any reaction (rash, tongues swelling, other) to the medicines stop taking and see a physician.   °Please follow up as directed and return to the ER or see a physician for new or worsening symptoms.  Thank you. ° ° °

## 2013-12-25 NOTE — ED Notes (Signed)
X-ray paged that pt is available for transport.

## 2013-12-25 NOTE — ED Notes (Addendum)
Pt finished with chemo and radiation therapy on Wednesday at [facility]. He said hed like to get some IV fluids today because he feels hes dehydrated from the chemo and radiation. States he hasnt felt much like eating and hes tired. He denies pain, he is a&ox4

## 2013-12-25 NOTE — ED Provider Notes (Signed)
CSN: [id number]     Arrival date & time 12/25/13  1113 History   First MD Initiated Contact with Patient 12/25/13 1210     Chief Complaint  Patient presents with  . Fatigue   (Consider location/radiation/quality/duration/timing/severity/associated sxs/prior Treatment) HPI Comments: 80 yo male with SVT, anemia, lipids, gastric CA presents with general weakness/ fatigue worse since last chemo dose on Friday.  Pt finished radiation on Wed at [facility].  Decreased po intake since.  No vomiting or bleeding.  Non focal sxs. Nothing improves.  No cp or sob.  No CAD hx.    The history is provided by the patient and a relative.    Past Medical History  Diagnosis Date  . Arthritis   . 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 Hypothyroidism   . Hyperlipidemia   . Pernicious anemia   . Allergy   . Gastric cancer 10/24/2013   Past Surgical History  Procedure Laterality Date  . Hernia repair    . 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 Cholecystectomy     Family History  Problem Relation Age of Onset  . Hypertension     History  Substance Use Topics  . Smoking status: Former Smoker -- 1.50 packs/day for 35 years    Types: Cigarettes    Quit date: 11/24/1990  . Smokeless tobacco: Not on file  . Alcohol Use: No    Review of Systems  Constitutional: Positive for appetite change and fatigue. Negative for fever and chills.  HENT: Negative for congestion.   Eyes: Negative for visual disturbance.  Respiratory: Positive for cough. Negative for shortness of breath.   Cardiovascular: Negative for chest pain.  Gastrointestinal: Positive for nausea. Negative for vomiting and abdominal pain.  Genitourinary: Negative for dysuria and flank pain.  Musculoskeletal: Negative for back pain, neck pain and neck stiffness.  Skin: Negative for rash.  Neurological: Positive for weakness and light-headedness. Negative for headaches.     Allergies  Lipitor  Home Medications   Current Outpatient Rx  Name  Route  Sig  Dispense  Refill  . acetaminophen (TYLENOL) 500 MG tablet   Oral   Take 500 mg by mouth every 6 (six) hours as needed.         . B Complex-C (SUPER B COMPLEX/VITAMIN C) TABS   Oral   Take 1 tablet by mouth every other day.          . capecitabine (XELODA) 500 MG tablet   Oral   Take 3 tablets (1,500 mg total) by mouth 2 (two) times daily after a meal. On days of radiation only (Mon-Fri)   150 tablet   0     Handwritten Rx given to managed care   . Cholecalciferol (VITAMIN D3) 2000 UNITS TABS   Oral   Take by mouth daily.         . clobetasol ointment (TEMOVATE) 0.05 %   Topical   Apply 1 application topically 2 (two) times daily. To affected areas on hands          . Cyanocobalamin (VITAMIN B-12 IJ)   Injection   Inject as directed as directed. Every other month         . emollient (BIAFINE) cream   Topical   Apply 1 application topically daily. 2nd tube given 12/12/13         . ferrous fumarate (  HEMOCYTE - 106 MG FE) 325 (106 FE) MG TABS   Oral   Take 1 tablet by mouth daily.          . fish oil-omega-3 fatty acids 1000 MG capsule   Oral   Take 2 g by mouth every other day.          . Glucose Blood (BAYER CONTOUR TEST VI)   In Vitro   by In Vitro route. Check sugar daily.         Marland Kitchen levothyroxine (SYNTHROID, LEVOTHROID) 50 MCG tablet   Oral   Take 50 mcg by mouth every morning.         . meclizine (ANTIVERT) 25 MG tablet   Oral   Take 25 mg by mouth 3 (three) times daily as needed.          . Multiple Vitamin (MULTIVITAMIN WITH MINERALS) TABS   Oral   Take 1 tablet by mouth every other day.          . neomycin-polymyxin-hydrocortisone (CORTISPORIN) 3.5-10000-1 otic suspension   Both Ears   Place 4 drops into both ears 2 (two) times daily.         . pantoprazole (PROTONIX) 40 MG tablet   Oral   Take 40 mg by mouth 2 (two) times daily.          . prochlorperazine (COMPAZINE) 5 MG tablet   Oral   Take 1-2 tablets (5-10 mg total) by mouth every 6 (six) hours as needed for nausea or vomiting (*PLEASE NOTE-WILL CAUSE DROWSINESS).   30 tablet   1   . ranitidine (ZANTAC) 150 MG capsule   Oral   Take 150 mg by mouth as needed.          . simvastatin (ZOCOR) 40 MG tablet   Oral   Take 40 mg by mouth every evening.         . triamcinolone (NASACORT) 55 MCG/ACT nasal inhaler   Nasal   Place 2 sprays into the nose as needed.          . triamcinolone cream (KENALOG) 0.1 %   Topical   Apply 1 application topically as needed. To affected areas on hands         . vitamin B-12 (CYANOCOBALAMIN) 100 MCG tablet   Oral   Take 50 mcg by mouth every other day.           Temp(Src) 98 F (36.7 C) (Oral)  Resp 16  SpO2 96% Physical Exam  Nursing note and vitals reviewed. Constitutional: He is oriented to person, place, and time. He appears well-developed and well-nourished.  HENT:  Head: Normocephalic and atraumatic.  Dry mm  Eyes: Conjunctivae are normal. Right eye exhibits no discharge. Left eye exhibits no discharge.  Neck: Normal range of motion. Neck supple. No tracheal deviation present.  Cardiovascular: Normal rate and regular rhythm.   Pulmonary/Chest: Effort normal. He has rales (few at bases).  Abdominal: Soft. He exhibits no distension. There is no tenderness. There is no guarding.  Musculoskeletal: He exhibits no edema.  Neurological: He is alert and oriented to person, place, and time. No cranial nerve deficit.  Skin: Skin is warm. No rash noted. There is pallor.  Psychiatric: He has a normal mood and affect.    ED Course  Procedures (including critical care time) Labs Review Labs Reviewed  COMPREHENSIVE METABOLIC PANEL - Abnormal; Notable for the following:    Sodium 134 (*)    Potassium 3.1 (*)  Glucose, Bld 141 (*)    Albumin 3.0 (*)    GFR calc non Af Amer 78 (*)    All other components  within normal limits  CBC WITH DIFFERENTIAL - Abnormal; Notable for the following:    WBC 3.6 (*)    RBC 3.49 (*)    Hemoglobin 10.9 (*)    HCT 31.7 (*)    RDW 19.7 (*)    Platelets 131 (*)    Neutrophils Relative % 85 (*)    Lymphocytes Relative 4 (*)    Lymphs Abs 0.1 (*)    All other components within normal limits  URINALYSIS, ROUTINE W REFLEX MICROSCOPIC  LACTIC ACID, PLASMA  TROPONIN I  MAGNESIUM   Imaging Review Dg Chest 2 View  12/25/2013   CLINICAL DATA:  Fatigue and history of stomach cancer and radiation therapy  EXAM: CHEST  2 VIEW  COMPARISON:  CT thorax 10/27/2013 and chest radiograph 06/20/2012  FINDINGS: Heart size and vascular pattern are normal. Is mild to moderate diffuse interstitial change. Left diaphragm is mildly elevated. No consolidation or pleural effusion. No significant change from prior study.  IMPRESSION: Stable interstitial lung disease.  No acute findings.   Electronically Signed   By: Skipper Cliche M.D.   On: 12/25/2013 14:05    EKG Interpretation    Date/Time:  Sunday December 25 2013 13:08:24 EST Ventricular Rate:  81 PR Interval:  173 QRS Duration: 119 QT Interval:  405 QTC Calculation: 470 R Axis:   -51 Text Interpretation:  Sinus rhythm Incomplete RBBB and LAFB T wave changes inferior and lateral Confirmed by Jevan Gaunt  MD, Jeral Zick ([id number]) on 12/25/2013 1:51:37 PM            MDM   1. Fatigue   2. EKG abnormalities   3. Hypokalemia   4. Gastric cancer   5. Dehydration    Clinically dehydrated/ general weakness.   Plan for labs, fluids, UA/ CXR to look for infection.   EKG and troponin for possible cardiac cause although less likely with HPI.  Fluids, Potassium, antiemetic. No cp or sob.  Subtle ekg changes I do not feel are related to pt presentation, outpt fup. Pt improved significantly on recheck.  Smiling, asking to go home.  Results and differential diagnosis were discussed with the patient. Close follow up outpatient was  discussed, patient comfortable with the plan.         Mariea Clonts, MD 12/25/13 [phone]

## 2013-12-25 NOTE — ED Notes (Signed)
Pt returned from X-ray.  

## 2013-12-26 ENCOUNTER — Other Ambulatory Visit (HOSPITAL_BASED_OUTPATIENT_CLINIC_OR_DEPARTMENT_OTHER): Payer: Medicare Other

## 2013-12-26 ENCOUNTER — Ambulatory Visit (HOSPITAL_BASED_OUTPATIENT_CLINIC_OR_DEPARTMENT_OTHER): Payer: Medicare Other | Admitting: Oncology

## 2013-12-26 ENCOUNTER — Telehealth: Payer: Self-pay | Admitting: Oncology

## 2013-12-26 VITALS — BP 90/64 | HR 77 | Temp 98.1°F | Resp 18 | Ht 71.0 in | Wt 175.7 lb

## 2013-12-26 DIAGNOSIS — D649 Anemia, unspecified: Secondary | ICD-10-CM

## 2013-12-26 DIAGNOSIS — K2940 Chronic atrophic gastritis without bleeding: Secondary | ICD-10-CM

## 2013-12-26 DIAGNOSIS — C169 Malignant neoplasm of stomach, unspecified: Secondary | ICD-10-CM

## 2013-12-26 DIAGNOSIS — R1310 Dysphagia, unspecified: Secondary | ICD-10-CM

## 2013-12-26 DIAGNOSIS — R5383 Other fatigue: Secondary | ICD-10-CM

## 2013-12-26 DIAGNOSIS — R5381 Other malaise: Secondary | ICD-10-CM

## 2013-12-26 LAB — CBC WITH DIFFERENTIAL/PLATELET
BASO%: 0.7 % (ref 0.0–2.0)
BASOS ABS: 0 10*3/uL (ref 0.0–0.1)
EOS ABS: 0.1 10*3/uL (ref 0.0–0.5)
EOS%: 2.8 % (ref 0.0–7.0)
HCT: 30.4 % — ABNORMAL LOW (ref 38.4–49.9)
HEMOGLOBIN: 10.2 g/dL — AB (ref 13.0–17.1)
LYMPH#: 0.1 10*3/uL — AB (ref 0.9–3.3)
LYMPH%: 4.1 % — AB (ref 14.0–49.0)
MCH: 31.6 pg (ref 27.2–33.4)
MCHC: 33.7 g/dL (ref 32.0–36.0)
MCV: 93.8 fL (ref 79.3–98.0)
MONO#: 0.4 10*3/uL (ref 0.1–0.9)
MONO%: 12.3 % (ref 0.0–14.0)
NEUT%: 80.1 % — ABNORMAL HIGH (ref 39.0–75.0)
NEUTROS ABS: 2.7 10*3/uL (ref 1.5–6.5)
Platelets: 128 10*3/uL — ABNORMAL LOW (ref 140–400)
RBC: 3.24 10*6/uL — ABNORMAL LOW (ref 4.20–5.82)
RDW: 22 % — ABNORMAL HIGH (ref 11.0–14.6)
WBC: 3.4 10*3/uL — AB (ref 4.0–10.3)

## 2013-12-26 LAB — COMPREHENSIVE METABOLIC PANEL (CC13)
ALK PHOS: 68 U/L (ref 40–150)
ALT: 12 U/L (ref 0–55)
AST: 25 U/L (ref 5–34)
Albumin: 2.9 g/dL — ABNORMAL LOW (ref 3.5–5.0)
Anion Gap: 8 mEq/L (ref 3–11)
BUN: 13 mg/dL (ref 7.0–26.0)
CO2: 25 mEq/L (ref 22–29)
Calcium: 8.6 mg/dL (ref 8.4–10.4)
Chloride: 103 mEq/L (ref 98–109)
Creatinine: 0.9 mg/dL (ref 0.7–1.3)
Glucose: 130 mg/dl (ref 70–140)
Potassium: 3.3 mEq/L — ABNORMAL LOW (ref 3.5–5.1)
SODIUM: 136 meq/L (ref 136–145)
TOTAL PROTEIN: 5.6 g/dL — AB (ref 6.4–8.3)
Total Bilirubin: 0.53 mg/dL (ref 0.20–1.20)

## 2013-12-26 NOTE — Progress Notes (Signed)
  Radiation Oncology         [phone] ________________________________  Name: Kyle Armstrong MRN: [id number]  Date: 12/15/2013  DOB: 14-Oct-1930  COMPLEX SIMULATION  NOTE  Diagnosis: Gastric cancer  Narrative The patient has initially been planned to receive a course of radiation treatment to a dose of 45 gray in 25 fractions at 1.8 gray per fraction. The patient will now receive a boost to the high risk target volume for an additional 5.4 gray. This will be delivered in 3 fractions at 1.8 gray per fraction and a cone down boost technique will be utilized. To accomplish this, an additional 3 customized blocks have been designed for this purpose. A complex isodose plan is requested to ensure that the high-risk target region receives the appropriate radiation dose and that the nearby normal structures continue to be appropriately spared. The patient's final total dose therefore will be 50.4 gray.   ________________________________ ------------------------------------------------  Jodelle Gross, MD, PhD

## 2013-12-26 NOTE — Telephone Encounter (Signed)
gv adn printed appt sched and avs for pt for Feb adn March...pt sched to see Dr. Barry Dienes on 3.2.15 @ 10:15am

## 2013-12-26 NOTE — Progress Notes (Signed)
   Kyle Armstrong    OFFICE PROGRESS NOTE   INTERVAL HISTORY:   Kyle Armstrong returns for scheduled followup of gastric cancer. He completed radiation 12/21/2013. He complains of increased malaise over the past few weeks. He was seen in the emergency room yesterday with malaise. He was treated with intravenous fluids for dehydration. He reports feeling better after the IV fluids. He has solid dysphagia, but no problem with liquids. He was able to a "big Mac "yesterday. He had a milkshake yesterday.  He has a single ulcer at the lower inner lip. The ulcer is painful when his dentures are in and he is eating.  Objective:  Vital signs in last 24 hours:  Blood pressure 90/64, pulse 77, temperature 98.1 F (36.7 C), temperature source Oral, resp. rate 18, height $RemoveBe'5\' 11"'[id number]$  (1.803 m), weight 175 lb 11.2 oz (79.697 kg).    HEENT: No thrush, 3-4 mm superficial ulceration at the lower inner anterior gumline, no thrush. No other ulcers. Resp: Lungs with inspiratory rales at the lower chest bilaterally, no respiratory distress Cardio: Regular rate and rhythm GI: No hepatomegaly, nontender, no mass Vascular: The left lower leg is larger than the right side, no edema  Skin: Radiation hyperpigmentation of the back, palms without erythema or skin breakdown  Lab Results:  Lab Results  Component Value Date   WBC 3.4* 12/26/2013   HGB 10.2* 12/26/2013   HCT 30.4* 12/26/2013   MCV 93.8 12/26/2013   PLT 128* 12/26/2013   NEUTROABS 2.7 12/26/2013   Potassium 3.3, creatinine 0.9, BUN 13   Medications: I have reviewed the patient's current medications.  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 HER-2/neu amplified  Staging chest CT negative on 10/27/2013.  Initiation radiation and concurrent gemcitabine 11/10/2013, completed 12/21/2013 2. Diffuse "gastritis "change noted on  endoscopies 08/19/2013 and 10/06/2013 with biopsies confirming atrophic gastritis. Low and high-grade dysplasia were noted on the biopsy 08/19/2013  3. Anemia-likely secondary to GI blood loss and chemotherapy/radiation. Hemoglobin stable. 4. Anorexia/weight loss.  5. History of colon polyp.  6. History of "sarcoidosis ", pulmonary fibrosis noted on the chest CT 10/27/2013.  7. History of tobacco use.  8. History of arrhythmia, status post and ablation August 2013.  9. Malaise-likely secondary to chemotherapy/radiation and malnutrition 10. Solid dysphagia most likely related to radiation esophagitis/gastritis     Disposition:  He has completed concurrent Xeloda and radiation. He has developed malaise over the past few weeks. I suspect his symptoms are related to toxicity from treatment. I encouraged him to push hydration and oral nutrition supplements. He plans to try Carnation instant breakfast. Kyle Armstrong will return for an office visit in 2 weeks. He will use Orajel on the gum ulcer prior to meals.  We will refer him back to Kyle Armstrong to consider surgery for the locally advanced gastric cancer.   Betsy Coder, MD  12/26/2013  12:02 PM

## 2013-12-26 NOTE — Progress Notes (Signed)
  Radiation Oncology         [phone] ________________________________  Name: Kyle Armstrong MRN: [id number]  Date: 12/21/2013  DOB: 02-03-1930  End of Treatment Note  Diagnosis:   Gastric cancer     Indication for treatment:  Curative       Radiation treatment dates:   11/10/2013 through 12/21/2013  Site/dose:   The patient was treated to the gastric region including the gross tumor volume and surrounding high-risk regions initially. The patient received 45 gray using a 5 field 3-D conformal technique. The patient then received a based using a three-field technique for 5.4 gray. The total dose was 50.4 gray.  Narrative: The patient tolerated radiation treatment relatively well.   The patient did excellent during the majority of his treatment in terms of a lack of significant nausea, poor appetite, or fatigue. He did experience some of this at the end of treatment.  Plan: The patient has completed radiation treatment. The patient will return to radiation oncology clinic for routine followup in one month. I advised the patient to call or return sooner if they have any questions or concerns related to their recovery or treatment. ________________________________  Jodelle Gross, M.D., Ph.D.

## 2013-12-27 ENCOUNTER — Telehealth: Payer: Self-pay | Admitting: Dietician

## 2013-12-27 NOTE — Telephone Encounter (Signed)
Brief Outpatient Oncology Nutrition Note  Patient has been identified to be at risk on malnutrition screen.  Wt Readings from Last 10 Encounters:  12/26/13 175 lb 11.2 oz (79.697 kg)  12/21/13 174 lb 1.6 oz (78.971 kg)  12/16/13 176 lb 4.8 oz (79.969 kg)  12/09/13 177 lb 4.8 oz (80.423 kg)  12/05/13 176 lb 12.8 oz (80.196 kg)  12/02/13 177 lb 14.4 oz (80.695 kg)  11/25/13 178 lb (80.74 kg)  11/18/13 179 lb 6.4 oz (81.375 kg)  11/16/13 182 lb 14.4 oz (82.963 kg)  11/11/13 182 lb 4.8 oz (82.691 kg)   Dx:  Locally advanced gastric cancer.  Patient of Dr. Benay Spice who is to be referred to Dr. Barry Dienes for consideration for surgery.  Patient is s/p Xeloda and radiation treatments.  Patient was treated with IVF yesterday and saw the MD today.  Patient with problems with solid dysphagia (most likely related to radiation esophagitis/gastritis) and a single ulcer at the lower inner lip which is painful when he is eating with his dentures in.    Called patient who reports a poor appetite and difficulty swallowing.  Drinks Ensure, Boost and [employer].  Will mail him coupons for these items and a factsheet on tips for swallowing problems.  Patient does not want an appointment with the RD at this time.  Will mail contact information for the [facility] RD and patient is to call if needed.  Antonieta Iba, RD, LDN

## 2014-01-09 ENCOUNTER — Telehealth: Payer: Self-pay | Admitting: *Deleted

## 2014-01-09 ENCOUNTER — Other Ambulatory Visit: Payer: Self-pay | Admitting: *Deleted

## 2014-01-09 DIAGNOSIS — C169 Malignant neoplasm of stomach, unspecified: Secondary | ICD-10-CM

## 2014-01-09 NOTE — Telephone Encounter (Signed)
Spoke with patient: He has been eating and drinking, but having stomach discomfort after eating. Has been drinking Boost drinks daily. Feels he is getting weaker. Denies dyspnea, but reports feeling lightheaded on occasion. Rescheduled appointment to 2/19 at 2:15 pm and will have lab prior.

## 2014-01-09 NOTE — Telephone Encounter (Signed)
Left VM that he will have to cancel his appointment tomorrow (weather) if he can't be seen today. Called back and left VM that if he is eating and drinking OK with no new problems, we can reschedule him for next week.

## 2014-01-10 ENCOUNTER — Ambulatory Visit: Payer: Medicare Other | Admitting: Nurse Practitioner

## 2014-01-12 ENCOUNTER — Other Ambulatory Visit (HOSPITAL_BASED_OUTPATIENT_CLINIC_OR_DEPARTMENT_OTHER): Payer: Medicare Other

## 2014-01-12 ENCOUNTER — Telehealth: Payer: Self-pay | Admitting: Oncology

## 2014-01-12 ENCOUNTER — Ambulatory Visit (HOSPITAL_BASED_OUTPATIENT_CLINIC_OR_DEPARTMENT_OTHER): Payer: Medicare Other | Admitting: Nurse Practitioner

## 2014-01-12 ENCOUNTER — Encounter: Payer: Self-pay | Admitting: Nurse Practitioner

## 2014-01-12 VITALS — BP 105/62 | HR 95 | Temp 97.1°F | Resp 18 | Ht 71.0 in | Wt 169.2 lb

## 2014-01-12 DIAGNOSIS — J8410 Pulmonary fibrosis, unspecified: Secondary | ICD-10-CM

## 2014-01-12 DIAGNOSIS — R5383 Other fatigue: Secondary | ICD-10-CM

## 2014-01-12 DIAGNOSIS — C169 Malignant neoplasm of stomach, unspecified: Secondary | ICD-10-CM

## 2014-01-12 DIAGNOSIS — K2940 Chronic atrophic gastritis without bleeding: Secondary | ICD-10-CM

## 2014-01-12 DIAGNOSIS — R5381 Other malaise: Secondary | ICD-10-CM

## 2014-01-12 DIAGNOSIS — D649 Anemia, unspecified: Secondary | ICD-10-CM

## 2014-01-12 DIAGNOSIS — R630 Anorexia: Secondary | ICD-10-CM

## 2014-01-12 DIAGNOSIS — R634 Abnormal weight loss: Secondary | ICD-10-CM

## 2014-01-12 LAB — CBC WITH DIFFERENTIAL/PLATELET
BASO%: 0.6 % (ref 0.0–2.0)
Basophils Absolute: 0 10*3/uL (ref 0.0–0.1)
EOS ABS: 0.2 10*3/uL (ref 0.0–0.5)
EOS%: 3.4 % (ref 0.0–7.0)
HEMATOCRIT: 34.3 % — AB (ref 38.4–49.9)
HGB: 11.4 g/dL — ABNORMAL LOW (ref 13.0–17.1)
LYMPH%: 29.3 % (ref 14.0–49.0)
MCH: 31 pg (ref 27.2–33.4)
MCHC: 33.2 g/dL (ref 32.0–36.0)
MCV: 93.6 fL (ref 79.3–98.0)
MONO#: 0.5 10*3/uL (ref 0.1–0.9)
MONO%: 10.7 % (ref 0.0–14.0)
NEUT%: 56 % (ref 39.0–75.0)
NEUTROS ABS: 2.7 10*3/uL (ref 1.5–6.5)
PLATELETS: 190 10*3/uL (ref 140–400)
RBC: 3.66 10*6/uL — AB (ref 4.20–5.82)
RDW: 21.6 % — ABNORMAL HIGH (ref 11.0–14.6)
WBC: 4.9 10*3/uL (ref 4.0–10.3)
lymph#: 1.4 10*3/uL (ref 0.9–3.3)

## 2014-01-12 NOTE — Progress Notes (Signed)
OFFICE PROGRESS NOTE  Interval history:  Kyle Armstrong returns for scheduled followup of gastric cancer. He continues to feel weak. Appetite varies. Yesterday he had a good appetite. Today after eating cereal and a banana he did not feel well. He is tolerating liquids without difficulty. He notes stable dyspnea on exertion. No fever.   Objective: Filed Vitals:   01/12/14 1415  BP: 105/62  Pulse: 95  Temp: 97.1 F (36.2 C)  Resp: 18   No thrush or ulcerations. Lungs with rales at the bases bilaterally. No respiratory distress. Regular cardiac rhythm. Abdomen soft and nontender. No hepatomegaly. No leg edema.   Lab Results: Lab Results  Component Value Date   WBC 4.9 01/12/2014   HGB 11.4* 01/12/2014   HCT 34.3* 01/12/2014   MCV 93.6 01/12/2014   PLT 190 01/12/2014   NEUTROABS 2.7 01/12/2014    Chemistry:    Chemistry      Component Value Date/Time   NA 136 12/26/2013 1125   NA 134* 12/25/2013 1200   K 3.3* 12/26/2013 1125   K 3.1* 12/25/2013 1200   CL 96 12/25/2013 1200   CO2 25 12/26/2013 1125   CO2 24 12/25/2013 1200   BUN 13.0 12/26/2013 1125   BUN 13 12/25/2013 1200   CREATININE 0.9 12/26/2013 1125   CREATININE 0.89 12/25/2013 1200      Component Value Date/Time   CALCIUM 8.6 12/26/2013 1125   CALCIUM 8.8 12/25/2013 1200   ALKPHOS 68 12/26/2013 1125   ALKPHOS 72 12/25/2013 1200   AST 25 12/26/2013 1125   AST 25 12/25/2013 1200   ALT 12 12/26/2013 1125   ALT 12 12/25/2013 1200   BILITOT 0.53 12/26/2013 1125   BILITOT 0.4 12/25/2013 1200       Studies/Results: Dg Chest 2 View  12/25/2013   CLINICAL DATA:  Fatigue and history of stomach cancer and radiation therapy  EXAM: CHEST  2 VIEW  COMPARISON:  CT thorax 10/27/2013 and chest radiograph 06/20/2012  FINDINGS: Heart size and vascular pattern are normal. Is mild to moderate diffuse interstitial change. Left diaphragm is mildly elevated. No consolidation or pleural effusion. No significant change from prior study.  IMPRESSION: Stable interstitial lung  disease.  No acute findings.   Electronically Signed   By: Skipper Cliche M.D.   On: 12/25/2013 14:05    Medications: I have reviewed the patient's current medications.  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 HER-2/neu amplified  Staging chest CT negative on 10/27/2013.  Initiation radiation and concurrent Xeloda 11/10/2013, completed 12/21/2013 2. Diffuse "gastritis "change noted on endoscopies 08/19/2013 and 10/06/2013 with biopsies confirming atrophic gastritis. Low and high-grade dysplasia were noted on the biopsy 08/19/2013  3. Anemia-likely secondary to GI blood loss and chemotherapy/radiation. Hemoglobin improved 01/12/2014.  4. Anorexia/weight loss.  5. History of colon polyp.  6. History of "sarcoidosis ", pulmonary fibrosis noted on the chest CT 10/27/2013.  7. History of tobacco use.  8. History of arrhythmia, status post and ablation August 2013.  9. Malaise-likely secondary to chemotherapy/radiation and malnutrition.  10. Solid dysphagia most likely related to radiation esophagitis/gastritis.  Dispositon-he continues to recover from Xeloda and radiation. Hopefully his condition will improve over the next few weeks. He is scheduled to see Dr. Barry Dienes on 01/23/2014. He will return for a followup visit here on 01/26/2014.  Plan reviewed with Dr. Benay Spice.   Ned Card ANP/GNP-BC

## 2014-01-23 ENCOUNTER — Encounter (INDEPENDENT_AMBULATORY_CARE_PROVIDER_SITE_OTHER): Payer: Self-pay

## 2014-01-23 ENCOUNTER — Encounter (INDEPENDENT_AMBULATORY_CARE_PROVIDER_SITE_OTHER): Payer: Self-pay | Admitting: General Surgery

## 2014-01-23 ENCOUNTER — Encounter: Payer: Self-pay | Admitting: Radiation Oncology

## 2014-01-23 ENCOUNTER — Ambulatory Visit (INDEPENDENT_AMBULATORY_CARE_PROVIDER_SITE_OTHER): Payer: Medicare Other | Admitting: General Surgery

## 2014-01-23 VITALS — BP 92/58 | HR 96 | Temp 98.1°F | Resp 16 | Ht 71.0 in | Wt 167.2 lb

## 2014-01-23 DIAGNOSIS — C163 Malignant neoplasm of pyloric antrum: Secondary | ICD-10-CM

## 2014-01-23 NOTE — Progress Notes (Signed)
HISTORY: Pt is a 80 yo F with carcinoma of gastric antrum who is here for follow up after neoadjuvant chemoradiation.  He continues to have some dysphasia, and sensation of food catching in his throat when he swallows.  He gets short of breath with activity some of the time, "on bad days," but other days feels "OK."     PERTINENT REVIEW OF SYSTEMS: Otherwise negative x 11 other than HPI  Filed Vitals:   01/23/14 1105  BP: 92/58  Pulse: 96  Temp: 98.1 F (36.7 C)  Resp: 16   Wt Readings from Last 3 Encounters:  01/23/14 167 lb 3.2 oz (75.841 kg)  01/12/14 169 lb 3.2 oz (76.749 kg)  12/26/13 175 lb 11.2 oz (79.697 kg)    EXAM: Head: Normocephalic and atraumatic. Thin appearing.   Eyes:  Conjunctivae are normal. Pupils are equal, round, and reactive to light. No scleral icterus.  Neck:  Normal range of motion. Neck supple. No tracheal deviation present. No thyromegaly present.  Resp: No respiratory distress, normal effort. Abd:  Abdomen is soft, non distended and non tender. No masses are palpable.  There is no rebound and no guarding.  Neurological: Alert and oriented to person, place, and time. Gait slowed, but pt able to get on table, lay down, and sit up without assistance.  Hard of hearing.    Skin: Skin is warm and dry. No rash noted. No diaphoretic. No erythema. No pallor.  Psychiatric: Normal mood and affect. Normal behavior. Judgment and thought content normal.      ASSESSMENT AND PLAN:   Cancer of antrum of stomach I will obtain repeat imaging now that we are done with neoadjuvant chemoradiation.  He did have a few areas of concern previously on chest CT.    I am also concerned about his weakness, decreased albumin, and decreased functional status.  I am obtaining a prealbumin.  I also requested that the patient bring his daughter when he comes for follow up appt to discuss scans.  I again do not think he would do well with total gastrectomy if he was found to have  linitis plastica at operative exploration.  I do think distal gastrectomy is reasonable.  As long as scans and prealbumin are OK, would plan diagnostic laparoscopy with plans to do distal gastrectomy/feeding tube, but would abort if found carcinomatosis or linitis plastica.     We will also send request for cardiology clearance in preparation for surgery.     Milus Height, MD Surgical Oncology, [facility]   Melinda Crutch, MD Melinda Crutch, MD

## 2014-01-23 NOTE — Patient Instructions (Signed)
Get CT scans.  Follow up with Dr. Lisbeth Renshaw and Dr. Benay Spice.    Follow up with me in 2 weeks.  Bring daughter/family with you.   Work on PO intake.

## 2014-01-23 NOTE — Assessment & Plan Note (Signed)
I will obtain repeat imaging now that we are done with neoadjuvant chemoradiation.  He did have a few areas of concern previously on chest CT.    I am also concerned about his weakness, decreased albumin, and decreased functional status.  I am obtaining a prealbumin.  I also requested that the patient bring his daughter when he comes for follow up appt to discuss scans.  I again do not think he would do well with total gastrectomy if he was found to have linitis plastica at operative exploration.  I do think distal gastrectomy is reasonable.  As long as scans and prealbumin are OK, would plan diagnostic laparoscopy with plans to do distal gastrectomy/feeding tube, but would abort if found carcinomatosis or linitis plastica.

## 2014-01-24 ENCOUNTER — Telehealth (INDEPENDENT_AMBULATORY_CARE_PROVIDER_SITE_OTHER): Payer: Self-pay | Admitting: *Deleted

## 2014-01-24 LAB — PREALBUMIN: PREALBUMIN: 17.2 mg/dL (ref 17.0–34.0)

## 2014-01-24 NOTE — Telephone Encounter (Signed)
I spoke with pt and informed him of the appt for his CT scan at [facility] on 01/25/14 with an arrival time of 11:00am.  I instructed pt to drink the 1st bottle of contrast at 9:10am and 2nd bottle at 10:10am.  I instructed him to have NO solid foods 4 hours prior to scan.  He is agreeable with this appt.

## 2014-01-25 ENCOUNTER — Ambulatory Visit
Admission: RE | Admit: 2014-01-25 | Discharge: 2014-01-25 | Disposition: A | Discharge status: Home / Self Care | Payer: Medicare Other | Source: Ambulatory Visit | Attending: General Surgery | Admitting: General Surgery

## 2014-01-25 DIAGNOSIS — C163 Malignant neoplasm of pyloric antrum: Secondary | ICD-10-CM

## 2014-01-25 MED ORDER — IOHEXOL 300 MG/ML  SOLN
100.0000 mL | Freq: Once | INTRAMUSCULAR | Status: AC | PRN
  Administered 2014-01-25: 100 mL via INTRAVENOUS

## 2014-01-26 ENCOUNTER — Ambulatory Visit: Admission: RE | Admit: 2014-01-26 | Payer: Medicare Other | Source: Ambulatory Visit | Admitting: Radiation Oncology

## 2014-01-26 ENCOUNTER — Ambulatory Visit (HOSPITAL_BASED_OUTPATIENT_CLINIC_OR_DEPARTMENT_OTHER): Payer: Medicare Other | Admitting: Oncology

## 2014-01-26 ENCOUNTER — Telehealth: Payer: Self-pay | Admitting: Oncology

## 2014-01-26 VITALS — BP 123/64 | HR 86 | Temp 97.7°F | Resp 18 | Ht 71.0 in | Wt 168.3 lb

## 2014-01-26 DIAGNOSIS — Z87891 Personal history of nicotine dependence: Secondary | ICD-10-CM

## 2014-01-26 DIAGNOSIS — R5381 Other malaise: Secondary | ICD-10-CM

## 2014-01-26 DIAGNOSIS — R5383 Other fatigue: Secondary | ICD-10-CM

## 2014-01-26 DIAGNOSIS — D649 Anemia, unspecified: Secondary | ICD-10-CM

## 2014-01-26 DIAGNOSIS — R634 Abnormal weight loss: Secondary | ICD-10-CM

## 2014-01-26 DIAGNOSIS — C169 Malignant neoplasm of stomach, unspecified: Secondary | ICD-10-CM

## 2014-01-26 HISTORY — DX: Personal history of irradiation: Z92.3

## 2014-01-26 NOTE — Telephone Encounter (Signed)
Gave pt appt for lab and Md on April 2015

## 2014-01-26 NOTE — Progress Notes (Signed)
OFFICE PROGRESS NOTE   INTERVAL HISTORY:   He returns for scheduled followup of gastric cancer. He reports an improved appetite. The dyspnea has also improved. He continues to have malaise. He saw Dr. Barry Dienes to discuss a partial gastrectomy.   Objective:  Vital signs in last 24 hours:  Blood pressure 123/64, pulse 86, temperature 97.7 F (36.5 C), temperature source Oral, resp. rate 18, height _0  (1.803 m), weight 168 lb 5 oz (76.346 kg), SpO2 97.00%.    HEENT: Neck without mass  Lymphatics: No cervical, supraclavicular, axillary, or inguinal nodes  Resp:  Inspiratory rales at the lower posterior chest bilaterally, no respiratory distress  Cardio: Regular rate and rhythm  GI: No hepatosplenomegaly, nontender  Vascular: The left lower leg is larger than the right side    Lab Results:  Lab Results  Component Value Date   WBC 4.9 01/12/2014   HGB 11.4* 01/12/2014   HCT 34.3* 01/12/2014   MCV 93.6 01/12/2014   PLT 190 01/12/2014   NEUTROABS 2.7 01/12/2014    Radiology:  Ct Chest W Contrast  01/25/2014   CLINICAL DATA:  History of gastric antral cancer. Post oral chemotherapy. Evaluate for progression.  EXAM: CT CHEST, ABDOMEN, AND PELVIS WITH CONTRAST  TECHNIQUE: Multidetector CT imaging of the chest, abdomen and pelvis was performed following the standard protocol during bolus administration of intravenous contrast.  CONTRAST:  113m OMNIPAQUE IOHEXOL 300 MG/ML  SOLN  COMPARISON:  DG CHEST 2 VIEW dated 12/25/2013; CT CHEST W/CM dated 10/27/2013; CT ABD/PELVIS W CM dated 08/30/2013  FINDINGS: CT CHEST FINDINGS  Areas of bilateral pulmonary fibrosis, most pronounced peripherally throughout all lung zones, slightly more prominent in the lung bases. Biapical nodular densities likely reflect scarring/ fibrosis and are stable.  Heart is upper limits normal in size. Dense coronary artery calcifications within the left anterior descending, left circumflex, and diagonal branches. Mild aortic  calcifications without aneurysm or dissection.  No mediastinal, hilar, or axillary adenopathy. Chest wall soft tissues are unremarkable.  Degenerative changes in the thoracic spine. No acute or focal bony abnormality.  CT ABDOMEN AND PELVIS FINDINGS  Well-circumscribed 7 mm hypodensity within the spleen, likely a small cyst, stable. No focal liver lesions. Prior cholecystectomy. Pancreas, adrenals and kidneys are unremarkable.  There is mild gastric antral wall thickening. The stomach is slightly better distended on today's study and this is better visualized. The anterior antral wall measures approximately 15 mm in thickness, similar to prior study. No visualized mesenteric or retroperitoneal adenopathy. Small bowel and large bowel unremarkable.  Mild prostate prominence with calcifications. Urinary bladder is unremarkable. No free fluid or free air.  Diffuse aortic and iliac calcifications without aneurysm.  No acute bony abnormality. Degenerative changes in the lumbar spine.  IMPRESSION: Circumferential gastric antral wall thickening compatible with patient's known gastric antral cancer. While this is better visualized on today's study due to better gastric distention, I do not suspect significant change since prior study. No evidence of distant metastatic disease.  Stable fibrosis throughout the lungs.  Coronary artery disease.   Electronically Signed   By: KRolm BaptiseM.D.   On: 01/25/2014 14:05   Ct Abdomen Pelvis W Contrast  01/25/2014   CLINICAL DATA:  History of gastric antral cancer. Post oral chemotherapy. Evaluate for progression.  EXAM: CT CHEST, ABDOMEN, AND PELVIS WITH CONTRAST  TECHNIQUE: Multidetector CT imaging of the chest, abdomen and pelvis was performed following the standard protocol during bolus administration of intravenous contrast.  CONTRAST:  108m  OMNIPAQUE IOHEXOL 300 MG/ML  SOLN  COMPARISON:  DG CHEST 2 VIEW dated 12/25/2013; CT CHEST W/CM dated 10/27/2013; CT ABD/PELVIS W CM dated  08/30/2013  FINDINGS: CT CHEST FINDINGS  Areas of bilateral pulmonary fibrosis, most pronounced peripherally throughout all lung zones, slightly more prominent in the lung bases. Biapical nodular densities likely reflect scarring/ fibrosis and are stable.  Heart is upper limits normal in size. Dense coronary artery calcifications within the left anterior descending, left circumflex, and diagonal branches. Mild aortic calcifications without aneurysm or dissection.  No mediastinal, hilar, or axillary adenopathy. Chest wall soft tissues are unremarkable.  Degenerative changes in the thoracic spine. No acute or focal bony abnormality.  CT ABDOMEN AND PELVIS FINDINGS  Well-circumscribed 7 mm hypodensity within the spleen, likely a small cyst, stable. No focal liver lesions. Prior cholecystectomy. Pancreas, adrenals and kidneys are unremarkable.  There is mild gastric antral wall thickening. The stomach is slightly better distended on today's study and this is better visualized. The anterior antral wall measures approximately 15 mm in thickness, similar to prior study. No visualized mesenteric or retroperitoneal adenopathy. Small bowel and large bowel unremarkable.  Mild prostate prominence with calcifications. Urinary bladder is unremarkable. No free fluid or free air.  Diffuse aortic and iliac calcifications without aneurysm.  No acute bony abnormality. Degenerative changes in the lumbar spine.  IMPRESSION: Circumferential gastric antral wall thickening compatible with patient's known gastric antral cancer. While this is better visualized on today's study due to better gastric distention, I do not suspect significant change since prior study. No evidence of distant metastatic disease.  Stable fibrosis throughout the lungs.  Coronary artery disease.   Electronically Signed   By: Rolm Baptise M.D.   On: 01/25/2014 14:05    Medications: I have reviewed the patient's current medications.  Assessment/Plan: 1.Gastric  cancer-invasive adenocarcinoma involving an antral ulcer, he appears to have localized disease based on the staging evaluation to date. HER-2/neu amplified  Staging chest CT negative on 10/27/2013.  Initiation radiation and concurrent Xeloda 11/10/2013, completed 12/21/2013 Restaging CTs of the chest, abdomen, and pelvis on 01/25/2014 with persistent antral thickening, no evidence of metastatic disease  2. Diffuse "gastritis "change noted on endoscopies 08/19/2013 and 10/06/2013 with biopsies confirming atrophic gastritis. Low and high-grade dysplasia were noted on the biopsy 08/19/2013  3. Anemia-likely secondary to GI blood loss and chemotherapy/radiation. Hemoglobin improved 01/12/2014.  4. Anorexia/weight loss. His weight is stable over the past 2 weeks.  5. History of colon polyp.  6. History of "sarcoidosis ", pulmonary fibrosis noted on the chest CT 10/27/2013.  7. History of tobacco use.  8. History of arrhythmia, status post an ablation August 2013.  9. Malaise-likely secondary to chemotherapy/radiation and malnutrition.    Disposition:  His performance status has improved over the past few weeks. The restaging CT scans show no evidence of distant metastatic disease. He will see Dr. Barry Dienes on 01/30/2014 to discuss surgery. Mr. Bauserman will return for an office visit here in 6 weeks. He will contact us in the interim as needed.   Betsy Coder, MD 01/26/2014, 1:56 PM

## 2014-01-30 ENCOUNTER — Ambulatory Visit (INDEPENDENT_AMBULATORY_CARE_PROVIDER_SITE_OTHER): Payer: Medicare Other | Admitting: General Surgery

## 2014-01-30 ENCOUNTER — Encounter (INDEPENDENT_AMBULATORY_CARE_PROVIDER_SITE_OTHER): Payer: Self-pay | Admitting: General Surgery

## 2014-01-30 ENCOUNTER — Encounter: Payer: Self-pay | Admitting: Cardiology

## 2014-01-30 ENCOUNTER — Encounter: Payer: Self-pay | Admitting: Interventional Cardiology

## 2014-01-30 ENCOUNTER — Ambulatory Visit (INDEPENDENT_AMBULATORY_CARE_PROVIDER_SITE_OTHER): Payer: Medicare Other | Admitting: Interventional Cardiology

## 2014-01-30 VITALS — BP 136/72 | HR 75 | Temp 97.5°F | Ht 70.0 in | Wt 169.0 lb

## 2014-01-30 VITALS — BP 125/65 | HR 78 | Ht 70.0 in | Wt 171.0 lb

## 2014-01-30 DIAGNOSIS — Z01810 Encounter for preprocedural cardiovascular examination: Secondary | ICD-10-CM

## 2014-01-30 DIAGNOSIS — E785 Hyperlipidemia, unspecified: Secondary | ICD-10-CM

## 2014-01-30 DIAGNOSIS — C169 Malignant neoplasm of stomach, unspecified: Secondary | ICD-10-CM

## 2014-01-30 DIAGNOSIS — I498 Other specified cardiac arrhythmias: Secondary | ICD-10-CM

## 2014-01-30 DIAGNOSIS — C163 Malignant neoplasm of pyloric antrum: Secondary | ICD-10-CM

## 2014-01-30 DIAGNOSIS — I471 Supraventricular tachycardia: Secondary | ICD-10-CM

## 2014-01-30 NOTE — Assessment & Plan Note (Signed)
Patient has reasonable nutritional status and based on his activity level is a reasonable surgical candidate. We had a long discussion about surgery and postoperative recovery. We also discussed the risks of surgery.  We reviewed that I would do a diagnostic laparoscopy. If I found carcinomatosis, I would abort the procedure.  I also reviewed that based on his age, if I found that a total gastrectomy would be needed to get a negative margin, I would not perform gastrectomy.  I reviewed that I would place a feeding tube for him postoperatively because of the risk of delayed gastric emptying as well as anastomotic swelling.  We discussed risks including bleeding, infection, damage to adjacent structures, blood clots, leak, need for further surgery, death, cardiac or pulmonary complications, and cancer recurrence.  He has an appointment with cardiology today to clear him for surgery. As long as we receive that, we'll then schedule.

## 2014-01-30 NOTE — Patient Instructions (Signed)
We will send clearance note to Dr. Barry Dienes.  Your physician recommends that you schedule a follow-up appointment as needed.

## 2014-01-30 NOTE — Progress Notes (Signed)
Patient ID: Kyle Armstrong, male   DOB: November 12, 1930, 80 y.o.   MRN: [id number]    [place], [place], [place]  [address] Phone: [phone] Fax:  [phone]  Date:  01/30/2014   ID:  Kyle Armstrong, DOB 06/05/1930, MRN [id number]  PCP:   Melinda Crutch, MD      History of Present Illness: Kyle Armstrong is a 79 y.o. 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  He had an ablation with Dr. Rayann Heman.  He has done well since that time.  He had been active until Jan 2015, when he required radiation for gastric cancer.  He had ben walking without CP or SHOB.  No palpitations since the ablation.   Lost weight with the cancer.  He has had several endoscopies and tolerated them without a problem..     Wt Readings from Last 3 Encounters:  01/30/14 171 lb (77.565 kg)  01/30/14 169 lb (76.658 kg)  01/26/14 168 lb 5 oz (76.346 kg)     Past Medical History  Diagnosis Date  . Arthritis   . 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 Hypothyroidism   . Hyperlipidemia   . Pernicious anemia   . Allergy   . Gastric cancer 10/24/2013  . Hx of radiation therapy 11/10/13-12/21/13    gastric ca, total 50.4Gy  . Eczema   . Lazy eye of right side     Current Outpatient Prescriptions  Medication Sig Dispense Refill  . acetaminophen (TYLENOL) 500 MG tablet Take 500 mg by mouth every 6 (six) hours as needed.      . Cyanocobalamin (VITAMIN B-12 IJ) Inject as directed every 30 (thirty) days.       . Glucose Blood (BAYER CONTOUR TEST VI) by In Vitro route. Check sugar daily.      Marland Kitchen levothyroxine (SYNTHROID, LEVOTHROID) 50 MCG tablet Take 50 mcg by mouth every morning.      . meclizine (ANTIVERT) 25 MG tablet Take 25 mg by mouth 3 (three) times daily as needed for nausea.       . pantoprazole (PROTONIX) 40 MG tablet Take 40 mg by mouth 2 (two) times daily as needed (for acid reflux).       . prochlorperazine (COMPAZINE) 5 MG tablet Take 1-2  tablets (5-10 mg total) by mouth every 6 (six) hours as needed for nausea or vomiting (*PLEASE NOTE-WILL CAUSE DROWSINESS).  30 tablet  1  . ranitidine (ZANTAC) 150 MG capsule Take 150 mg by mouth as needed.       . simvastatin (ZOCOR) 40 MG tablet Take 40 mg by mouth every evening.       No current facility-administered medications for this visit.    Allergies:    Allergies  Allergen Reactions  . Lipitor [Atorvastatin] Other (See Comments)    Leg cramps  . Amoxicillin Rash    rash  . Augmentin [Amoxicillin-Pot Clavulanate] Rash    Social History:  The patient  reports that he quit smoking about 23 years ago. His smoking use included Cigarettes. He has a 52.5 pack-year smoking history. He does not have any smokeless tobacco history on file. He reports that he does not drink alcohol or use illicit drugs.   Family History:  The patient's family history includes Hypertension in an other family member.   ROS:  Please see the history of present illness.  No  nausea, vomiting.  No fevers, chills.  No focal weakness.  No dysuria.  All other systems reviewed and negative.   PHYSICAL EXAM: VS:  BP 125/65  Pulse 78  Ht 5\' 10"  (1.778 m)  Wt 171 lb (77.565 kg)  BMI 24.54 kg/m2 Well nourished, well developed, in no acute distress HEENT: normal Neck: no JVD, no carotid bruits Cardiac:  normal S1, S2; RRR;  Lungs:  clear to auscultation bilaterally, no wheezing, rhonchi or rales Abd: soft, nontender, no hepatomegaly Ext: no edema Skin: warm and dry Neuro:   no focal abnormalities noted  EKG:    NSR, inferior T wave inversions; similar to prior ECG  2008 stress test with no ischemia and Norma LVEF 72%  ASSESSMENT AND PLAN:  1. Preoperative evaluation: Normal exercise tolerance prior to radiation therapy. No further cardiac testing needed before surgery. No modifiable risk factors prior to surgery.  He is at moderate risk for cardiac complication during surgery due to his age; 1- 3%.    2. SVT: Resolved post ablation. 3. Hyperlipidemia: simvastatin to keep LDL down.  LDL target < 130.  4. Fatigue: Started since radiation started.  Likely related to cancer treatment.    Signed, Mina Marble, MD, [facility] 01/30/2014 12:47 PM

## 2014-01-30 NOTE — Progress Notes (Signed)
HISTORY: Pt is a 80 yo F with carcinoma of gastric antrum who is here for follow up after neoadjuvant chemoradiation.  His dysphagia has improved.  He is still having good days and days where he gets weak easily.  I did get scans and prealbumin.  Scans did not show evidence of metastatic disease.  Prealbumin was normal at 17.  He is here with his children.      PERTINENT REVIEW OF SYSTEMS: Otherwise negative x 11 other than HPI  Filed Vitals:   01/30/14 0941  BP: 136/72  Pulse: 75  Temp: 97.5 F (36.4 C)   Wt Readings from Last 3 Encounters:  01/30/14 169 lb (76.658 kg)  01/26/14 168 lb 5 oz (76.346 kg)  01/23/14 167 lb 3.2 oz (75.841 kg)    EXAM: Pt not examined today.       ASSESSMENT AND PLAN:   Cancer of antrum of stomach Patient has reasonable nutritional status and based on his activity level is a reasonable surgical candidate. We had a long discussion about surgery and postoperative recovery. We also discussed the risks of surgery.  We reviewed that I would do a diagnostic laparoscopy. If I found carcinomatosis, I would abort the procedure.  I also reviewed that based on his age, if I found that a total gastrectomy would be needed to get a negative margin, I would not perform gastrectomy.  I reviewed that I would place a feeding tube for him postoperatively because of the risk of delayed gastric emptying as well as anastomotic swelling.  We discussed risks including bleeding, infection, damage to adjacent structures, blood clots, leak, need for further surgery, death, cardiac or pulmonary complications, and cancer recurrence.  He has an appointment with cardiology today to clear him for surgery. As long as we receive that, we'll then schedule.   Entire visit spent in counseling with pt, son, and daughter\.  Visit time 35 minutes.   We also discussed advanced directives/living will.   Pt consents to temporary interventions, but if he has complications and gets very  sick, he does not want prolonged ICU/ventilator care.    Milus Height, MD Surgical Oncology, [facility]   Melinda Crutch, MD Melinda Crutch, MD

## 2014-01-30 NOTE — Patient Instructions (Signed)
Antrectomy, Partial, or Subtotal Gastrectomy  An antrectomy, partial, or subtotal gastrectomy is the surgical removal of part of the stomach. It is performed to treat cancer of the stomach, ulcer disease, obstruction, or injury (trauma).  I will look in with a camera first to make sure I do not see any evidence of spread of disease.  If so, we will abort the procedure.    LET YOUR CAREGIVER KNOW ABOUT:   Allergies to food or medicine.  Medicines taken, including vitamins, herbs, eyedrops, over-the-counter medicines, and creams.  Use of steroids (by mouth or creams).  Previous problems with anesthetics or numbing medicines.  History of bleeding problems or blood clots.  Previous surgery.  Other health problems, including diabetes and kidney problems.  Possibility of pregnancy, if this applies. RISKS AND COMPLICATIONS  You will be monitored closely for complications during surgery and recovery. Many complications can be treated. Complications may include:  Bleeding.  Infection.  Reaction to anesthesia.  Damage to other organs or tissue.  Hernia.  Blood clot. BEFORE THE PROCEDURE  Before your procedure, you may have:  A physical exam, blood tests, stool test, X-rays, and other procedures.  Chemotherapy or radiation therapy.  Your caregiver review with you the procedure, the anesthesia being used, and what to expect after the procedure. You may be asked to:  Stop taking certain medicines for several days prior to your procedure, such as blood thinners (including aspirin).  Take certain medicines, such as antibiotics or stool softeners.  Follow a special diet for several days prior to the procedure.  Avoid eating and drinking after midnight the night before the procedure. This will help you to avoid complications from the anesthesia.  Take an antibacterial shower the night before or the morning of the procedure. Arrange for a ride home after surgery and to ask  someone to help you with activities during recovery. PROCEDURE   You will be given a medicine to make you sleep (general anesthesia) during the procedure.  The procedure takes a few to several hours to complete.   During an open procedure, the surgeon will make a cut (incision) in the abdominal wall to access the stomach.  During a laparoscopic procedure, small incisions are made in the abdominal wall. Small, lighted tubes (laparoscopes) with instruments are inserted into the surgical site.  Sometimes, a combination procedure is performed using both of these techniques.  The surgeon will remove a part of the stomach and connect the remaining stomach to the small intestine. Depending on your condition, other parts (spleen, pancreas, lymph nodes) may be removed during surgery as well.  AFTER THE PROCEDURE   You will be monitored closely in a recovery room.  You will need to stay in the hospital for up to a week or longer, depending on your condition.  You will be given medicine for pain and nutrition through an intravenous (IV) access.  If you have a tube in the nose (nasogastric, NG tube) to remove fluids, it will be removed after 2 to 3 days.  We will place a feeding tube for assistance with your nutrition.    MAKE SURE YOU:   Understand these instructions.  Will watch your condition.  Will get help right away if you are not doing well or get worse.  Marland Kitchen

## 2014-02-07 ENCOUNTER — Encounter (HOSPITAL_COMMUNITY): Payer: Self-pay | Admitting: Pharmacy Technician

## 2014-02-09 ENCOUNTER — Encounter (HOSPITAL_COMMUNITY): Payer: Self-pay

## 2014-02-09 ENCOUNTER — Encounter (HOSPITAL_COMMUNITY)
Admission: RE | Admit: 2014-02-09 | Discharge: 2014-02-09 | Disposition: A | Discharge status: Home / Self Care | Payer: Medicare Other | Source: Ambulatory Visit | Attending: General Surgery | Admitting: General Surgery

## 2014-02-09 DIAGNOSIS — Z01812 Encounter for preprocedural laboratory examination: Secondary | ICD-10-CM | POA: Insufficient documentation

## 2014-02-09 LAB — COMPREHENSIVE METABOLIC PANEL
ALK PHOS: 88 U/L (ref 39–117)
ALT: 17 U/L (ref 0–53)
AST: 33 U/L (ref 0–37)
Albumin: 3.1 g/dL — ABNORMAL LOW (ref 3.5–5.2)
BILIRUBIN TOTAL: 0.4 mg/dL (ref 0.3–1.2)
BUN: 11 mg/dL (ref 6–23)
CHLORIDE: 101 meq/L (ref 96–112)
CO2: 26 mEq/L (ref 19–32)
Calcium: 9.4 mg/dL (ref 8.4–10.5)
Creatinine, Ser: 0.84 mg/dL (ref 0.50–1.35)
GFR calc non Af Amer: 80 mL/min — ABNORMAL LOW (ref >90)
GLUCOSE: 77 mg/dL (ref 70–99)
POTASSIUM: 3.9 meq/L (ref 3.7–5.3)
Sodium: 138 mEq/L (ref 137–147)
Total Protein: 7.2 g/dL (ref 6.0–8.3)

## 2014-02-09 LAB — URINALYSIS, ROUTINE W REFLEX MICROSCOPIC
Bilirubin Urine: NEGATIVE
GLUCOSE, UA: NEGATIVE mg/dL
Hgb urine dipstick: NEGATIVE
Ketones, ur: NEGATIVE mg/dL
LEUKOCYTES UA: NEGATIVE
Nitrite: NEGATIVE
PH: 6.5 (ref 5.0–8.0)
Protein, ur: NEGATIVE mg/dL
Specific Gravity, Urine: 1.012 (ref 1.005–1.030)
Urobilinogen, UA: 1 mg/dL (ref 0.0–1.0)

## 2014-02-09 LAB — PROTIME-INR
INR: 1.06 (ref 0.00–1.49)
Prothrombin Time: 13.6 seconds (ref 11.6–15.2)

## 2014-02-09 LAB — CBC WITH DIFFERENTIAL/PLATELET
Basophils Absolute: 0 10*3/uL (ref 0.0–0.1)
Basophils Relative: 1 % (ref 0–1)
Eosinophils Absolute: 0.4 10*3/uL (ref 0.0–0.7)
Eosinophils Relative: 10 % — ABNORMAL HIGH (ref 0–5)
HCT: 32.1 % — ABNORMAL LOW (ref 39.0–52.0)
HEMOGLOBIN: 10.5 g/dL — AB (ref 13.0–17.0)
Lymphocytes Relative: 15 % (ref 12–46)
Lymphs Abs: 0.7 10*3/uL (ref 0.7–4.0)
MCH: 30.6 pg (ref 26.0–34.0)
MCHC: 32.7 g/dL (ref 30.0–36.0)
MCV: 93.6 fL (ref 78.0–100.0)
MONOS PCT: 13 % — AB (ref 3–12)
Monocytes Absolute: 0.6 10*3/uL (ref 0.1–1.0)
NEUTROS ABS: 2.7 10*3/uL (ref 1.7–7.7)
NEUTROS PCT: 62 % (ref 43–77)
Platelets: 172 10*3/uL (ref 150–400)
RBC: 3.43 MIL/uL — ABNORMAL LOW (ref 4.22–5.81)
RDW: 15.3 % (ref 11.5–15.5)
WBC: 4.4 10*3/uL (ref 4.0–10.5)

## 2014-02-09 LAB — APTT: APTT: 42 s — AB (ref 24–37)

## 2014-02-09 LAB — ABO/RH: ABO/RH(D): A POS

## 2014-02-09 NOTE — Progress Notes (Signed)
Chest ct 12-27-13 epic ekg 12-25-13 epic 01-30-14 cardiac clearance note dr Ellwood Sayers epic

## 2014-02-09 NOTE — Patient Instructions (Addendum)
[place]  02/09/2014   Your procedure is scheduled on: Wednesday March 25th, 2015  Report to [facility] at 41 AM.  Call this number if you have problems the morning of surgery [phone]   Remember:  Do not eat food or drink liquids :After Midnight.     Take these medicines the morning of surgery with A SIP OF WATER: synthroid, protonix                                SEE [place] [facility] PREPARING FOR SURGERY SHEET             You may not have any metal on your body including hair pins and piercings  Do not wear jewelry, make-up.  Do not wear lotions, powders, or perfumes. No  Deodorant is to be worn.   Men may shave face and neck.  Do not bring valuables to the hospital. [name].  Contacts, dentures or bridgework may not be worn into surgery.  Leave suitcase in the car. After surgery it may be brought to your room.  For patients admitted to the hospital, checkout time is 11:00 AM the day of discharge.   Patients discharged the day of surgery will not be allowed to drive home.  Name and phone number of your driver:  Special Instructions: N/A  Please read over the following fact sheets that you were given: [facility] Preparing for surgery sheet.  Call Zelphia Cairo RN pre op nurse if needed [phone]    FAILURE TO FOLLOW THESE INSTRUCTIONS MAY RESULT IN THE CANCELLATION OF YOUR SURGERY.  PATIENT SIGNATURE___________________________________________  NURSE SIGNATURE_____________________________________________

## 2014-02-10 LAB — HEMOGLOBIN A1C
HEMOGLOBIN A1C: 5.5 % (ref <5.7)
Mean Plasma Glucose: 111 mg/dL (ref <117)

## 2014-02-15 ENCOUNTER — Encounter (HOSPITAL_COMMUNITY): Payer: Medicare Other | Admitting: Anesthesiology

## 2014-02-15 ENCOUNTER — Inpatient Hospital Stay (HOSPITAL_COMMUNITY)
Admission: RE | Admit: 2014-02-15 | Discharge: 2014-02-24 | DRG: 327 | Disposition: A | Discharge status: Home Health | Payer: Medicare Other | Source: Ambulatory Visit | Attending: General Surgery | Admitting: General Surgery

## 2014-02-15 ENCOUNTER — Encounter (HOSPITAL_COMMUNITY): Payer: Self-pay

## 2014-02-15 ENCOUNTER — Encounter (HOSPITAL_COMMUNITY)
Admission: RE | Disposition: A | Discharge status: Home Health | Payer: Self-pay | Source: Ambulatory Visit | Attending: General Surgery

## 2014-02-15 ENCOUNTER — Inpatient Hospital Stay (HOSPITAL_COMMUNITY): Payer: Medicare Other | Admitting: Anesthesiology

## 2014-02-15 DIAGNOSIS — I10 Essential (primary) hypertension: Secondary | ICD-10-CM | POA: Diagnosis present

## 2014-02-15 DIAGNOSIS — E43 Unspecified severe protein-calorie malnutrition: Secondary | ICD-10-CM | POA: Diagnosis present

## 2014-02-15 DIAGNOSIS — Z934 Other artificial openings of gastrointestinal tract status: Secondary | ICD-10-CM

## 2014-02-15 DIAGNOSIS — J8410 Pulmonary fibrosis, unspecified: Secondary | ICD-10-CM | POA: Diagnosis present

## 2014-02-15 DIAGNOSIS — K219 Gastro-esophageal reflux disease without esophagitis: Secondary | ICD-10-CM | POA: Diagnosis present

## 2014-02-15 DIAGNOSIS — M129 Arthropathy, unspecified: Secondary | ICD-10-CM | POA: Diagnosis present

## 2014-02-15 DIAGNOSIS — C169 Malignant neoplasm of stomach, unspecified: Secondary | ICD-10-CM

## 2014-02-15 DIAGNOSIS — Z01812 Encounter for preprocedural laboratory examination: Secondary | ICD-10-CM

## 2014-02-15 DIAGNOSIS — E785 Hyperlipidemia, unspecified: Secondary | ICD-10-CM | POA: Diagnosis present

## 2014-02-15 DIAGNOSIS — K259 Gastric ulcer, unspecified as acute or chronic, without hemorrhage or perforation: Secondary | ICD-10-CM | POA: Diagnosis present

## 2014-02-15 DIAGNOSIS — R1310 Dysphagia, unspecified: Secondary | ICD-10-CM | POA: Diagnosis present

## 2014-02-15 DIAGNOSIS — M1990 Unspecified osteoarthritis, unspecified site: Secondary | ICD-10-CM | POA: Diagnosis present

## 2014-02-15 DIAGNOSIS — D510 Vitamin B12 deficiency anemia due to intrinsic factor deficiency: Secondary | ICD-10-CM | POA: Diagnosis present

## 2014-02-15 DIAGNOSIS — IMO0002 Reserved for concepts with insufficient information to code with codable children: Secondary | ICD-10-CM

## 2014-02-15 DIAGNOSIS — E039 Hypothyroidism, unspecified: Secondary | ICD-10-CM | POA: Diagnosis present

## 2014-02-15 DIAGNOSIS — E441 Mild protein-calorie malnutrition: Secondary | ICD-10-CM | POA: Diagnosis not present

## 2014-02-15 DIAGNOSIS — K560 Paralytic ileus: Secondary | ICD-10-CM | POA: Diagnosis not present

## 2014-02-15 DIAGNOSIS — C163 Malignant neoplasm of pyloric antrum: Principal | ICD-10-CM | POA: Diagnosis present

## 2014-02-15 DIAGNOSIS — H9190 Unspecified hearing loss, unspecified ear: Secondary | ICD-10-CM | POA: Diagnosis present

## 2014-02-15 HISTORY — PX: LAPAROSCOPIC PARTIAL GASTRECTOMY: SHX5908

## 2014-02-15 LAB — TYPE AND SCREEN
ABO/RH(D): A POS
Antibody Screen: NEGATIVE

## 2014-02-15 LAB — CBC
HCT: 31.4 % — ABNORMAL LOW (ref 39.0–52.0)
Hemoglobin: 10.3 g/dL — ABNORMAL LOW (ref 13.0–17.0)
MCH: 30.2 pg (ref 26.0–34.0)
MCHC: 32.8 g/dL (ref 30.0–36.0)
MCV: 92.1 fL (ref 78.0–100.0)
PLATELETS: 135 10*3/uL — AB (ref 150–400)
RBC: 3.41 MIL/uL — ABNORMAL LOW (ref 4.22–5.81)
RDW: 14.7 % (ref 11.5–15.5)
WBC: 6.9 10*3/uL (ref 4.0–10.5)

## 2014-02-15 LAB — CREATININE, SERUM
CREATININE: 0.71 mg/dL (ref 0.50–1.35)
GFR calc Af Amer: >90 mL/min (ref >90)
GFR calc non Af Amer: 86 mL/min — ABNORMAL LOW (ref >90)

## 2014-02-15 SURGERY — LAPAROSCOPIC PARTIAL GASTRECTOMY
Anesthesia: General | Site: Abdomen

## 2014-02-15 MED ORDER — HYDROMORPHONE HCL PF 1 MG/ML IJ SOLN: 0.2500 mg | INTRAMUSCULAR | Status: DC | PRN

## 2014-02-15 MED ORDER — KCL IN DEXTROSE-NACL 20-5-0.45 MEQ/L-%-% IV SOLN
INTRAVENOUS | Status: DC
  Administered 2014-02-15: 100 mL/h via INTRAVENOUS
  Administered 2014-02-16 (×2): via INTRAVENOUS
  Administered 2014-02-17: 75 mL via INTRAVENOUS
  Administered 2014-02-17 – 2014-02-21 (×6): via INTRAVENOUS
  Administered 2014-02-22: 50 mL/h via INTRAVENOUS
  Administered 2014-02-23: 13:00:00 via INTRAVENOUS
  Filled 2014-02-15 (×16): qty 1000

## 2014-02-15 MED ORDER — LACTATED RINGERS IV SOLN
INTRAVENOUS | Status: DC | PRN
  Administered 2014-02-15 (×3): via INTRAVENOUS

## 2014-02-15 MED ORDER — NALOXONE HCL 0.4 MG/ML IJ SOLN
0.4000 mg | INTRAMUSCULAR | Status: DC | PRN
  Administered 2014-02-15: 0.4 mg via INTRAVENOUS

## 2014-02-15 MED ORDER — GLYCOPYRROLATE 0.2 MG/ML IJ SOLN
INTRAMUSCULAR | Status: DC | PRN
  Administered 2014-02-15: .6 mg via INTRAVENOUS

## 2014-02-15 MED ORDER — ONDANSETRON HCL 4 MG PO TABS
4.0000 mg | ORAL_TABLET | Freq: Four times a day (QID) | ORAL | Status: DC | PRN
  Filled 2014-02-15: qty 1

## 2014-02-15 MED ORDER — ENOXAPARIN SODIUM 40 MG/0.4ML ~~LOC~~ SOLN
40.0000 mg | SUBCUTANEOUS | Status: DC
  Administered 2014-02-16 – 2014-02-24 (×9): 40 mg via SUBCUTANEOUS
  Filled 2014-02-15 (×9): qty 0.4

## 2014-02-15 MED ORDER — EPHEDRINE SULFATE 50 MG/ML IJ SOLN
INTRAMUSCULAR | Status: AC
  Filled 2014-02-15: qty 1

## 2014-02-15 MED ORDER — METRONIDAZOLE IN NACL 5-0.79 MG/ML-% IV SOLN
INTRAVENOUS | Status: AC
  Filled 2014-02-15: qty 100

## 2014-02-15 MED ORDER — HYDROMORPHONE HCL PF 1 MG/ML IJ SOLN
INTRAMUSCULAR | Status: DC | PRN
  Administered 2014-02-15 (×3): .4 mg via INTRAVENOUS

## 2014-02-15 MED ORDER — BUPIVACAINE 0.25 % ON-Q PUMP DUAL CATH 300 ML
300.0000 mL | INJECTION | Status: DC
Start: 2014-02-15 — End: 2014-02-15
  Administered 2014-02-15: 300 mL
  Filled 2014-02-15: qty 300

## 2014-02-15 MED ORDER — ONDANSETRON HCL 4 MG/2ML IJ SOLN: 4.0000 mg | Freq: Four times a day (QID) | INTRAMUSCULAR | Status: DC | PRN

## 2014-02-15 MED ORDER — SODIUM CHLORIDE 0.9 % IJ SOLN: 9.0000 mL | INTRAMUSCULAR | Status: DC | PRN

## 2014-02-15 MED ORDER — ACETAMINOPHEN 10 MG/ML IV SOLN
1000.0000 mg | Freq: Once | INTRAVENOUS | Status: AC
  Administered 2014-02-15: 1000 mg via INTRAVENOUS
  Filled 2014-02-15: qty 100

## 2014-02-15 MED ORDER — BUPIVACAINE-EPINEPHRINE 0.25% -1:200000 IJ SOLN
INTRAMUSCULAR | Status: DC | PRN
  Administered 2014-02-15: 5 mL
  Administered 2014-02-15: 10 mL

## 2014-02-15 MED ORDER — PROPOFOL 10 MG/ML IV BOLUS
INTRAVENOUS | Status: AC
  Filled 2014-02-15: qty 20

## 2014-02-15 MED ORDER — PHENYLEPHRINE 40 MCG/ML (10ML) SYRINGE FOR IV PUSH (FOR BLOOD PRESSURE SUPPORT)
PREFILLED_SYRINGE | INTRAVENOUS | Status: AC
  Filled 2014-02-15: qty 10

## 2014-02-15 MED ORDER — DIPHENHYDRAMINE HCL 12.5 MG/5ML PO ELIX: 12.5000 mg | ORAL_SOLUTION | Freq: Four times a day (QID) | ORAL | Status: DC | PRN

## 2014-02-15 MED ORDER — PHENYLEPHRINE HCL 10 MG/ML IJ SOLN
INTRAMUSCULAR | Status: AC
  Filled 2014-02-15: qty 1

## 2014-02-15 MED ORDER — NALOXONE HCL 0.4 MG/ML IJ SOLN: 0.4000 mg | INTRAMUSCULAR | Status: DC | PRN

## 2014-02-15 MED ORDER — SUCCINYLCHOLINE CHLORIDE 20 MG/ML IJ SOLN
INTRAMUSCULAR | Status: DC | PRN
  Administered 2014-02-15: 80 mg via INTRAVENOUS

## 2014-02-15 MED ORDER — HYDROMORPHONE HCL PF 2 MG/ML IJ SOLN
INTRAMUSCULAR | Status: AC
  Filled 2014-02-15: qty 1

## 2014-02-15 MED ORDER — MORPHINE SULFATE 2 MG/ML IJ SOLN
1.0000 mg | INTRAMUSCULAR | Status: DC | PRN
  Filled 2014-02-15: qty 1

## 2014-02-15 MED ORDER — ONDANSETRON HCL 4 MG/2ML IJ SOLN
4.0000 mg | Freq: Four times a day (QID) | INTRAMUSCULAR | Status: DC | PRN
  Administered 2014-02-18 – 2014-02-23 (×6): 4 mg via INTRAVENOUS
  Filled 2014-02-15 (×6): qty 2

## 2014-02-15 MED ORDER — CIPROFLOXACIN IN D5W 400 MG/200ML IV SOLN
400.0000 mg | INTRAVENOUS | Status: AC
  Administered 2014-02-15: 400 mg via INTRAVENOUS

## 2014-02-15 MED ORDER — BUPIVACAINE-EPINEPHRINE PF 0.25-1:200000 % IJ SOLN
INTRAMUSCULAR | Status: AC
  Filled 2014-02-15: qty 30

## 2014-02-15 MED ORDER — SUFENTANIL CITRATE 50 MCG/ML IV SOLN
INTRAVENOUS | Status: DC | PRN
  Administered 2014-02-15: 20 ug via INTRAVENOUS
  Administered 2014-02-15 (×3): 10 ug via INTRAVENOUS

## 2014-02-15 MED ORDER — CIPROFLOXACIN IN D5W 400 MG/200ML IV SOLN
400.0000 mg | Freq: Two times a day (BID) | INTRAVENOUS | Status: AC
  Administered 2014-02-15: 400 mg via INTRAVENOUS
  Filled 2014-02-15: qty 200

## 2014-02-15 MED ORDER — PROMETHAZINE HCL 25 MG/ML IJ SOLN: 6.2500 mg | INTRAMUSCULAR | Status: DC | PRN

## 2014-02-15 MED ORDER — DIPHENHYDRAMINE HCL 50 MG/ML IJ SOLN: 12.5000 mg | Freq: Four times a day (QID) | INTRAMUSCULAR | Status: DC | PRN

## 2014-02-15 MED ORDER — ONDANSETRON HCL 4 MG/2ML IJ SOLN
INTRAMUSCULAR | Status: DC | PRN
Start: 2014-02-15 — End: 2014-02-15
  Administered 2014-02-15: 4 mg via INTRAVENOUS

## 2014-02-15 MED ORDER — NEOSTIGMINE METHYLSULFATE 1 MG/ML IJ SOLN
INTRAMUSCULAR | Status: DC | PRN
  Administered 2014-02-15: 4 mg via INTRAVENOUS

## 2014-02-15 MED ORDER — LIDOCAINE HCL (CARDIAC) 20 MG/ML IV SOLN
INTRAVENOUS | Status: AC
  Filled 2014-02-15: qty 5

## 2014-02-15 MED ORDER — METRONIDAZOLE IN NACL 5-0.79 MG/ML-% IV SOLN
500.0000 mg | Freq: Three times a day (TID) | INTRAVENOUS | Status: AC
  Administered 2014-02-15 – 2014-02-16 (×2): 500 mg via INTRAVENOUS
  Filled 2014-02-15 (×2): qty 100

## 2014-02-15 MED ORDER — CISATRACURIUM BESYLATE (PF) 10 MG/5ML IV SOLN
INTRAVENOUS | Status: DC | PRN
  Administered 2014-02-15: 1 mg via INTRAVENOUS
  Administered 2014-02-15: 5 mg via INTRAVENOUS
  Administered 2014-02-15: 2 mg via INTRAVENOUS
  Administered 2014-02-15: 3 mg via INTRAVENOUS

## 2014-02-15 MED ORDER — CISATRACURIUM BESYLATE 20 MG/10ML IV SOLN
INTRAVENOUS | Status: AC
  Filled 2014-02-15: qty 10

## 2014-02-15 MED ORDER — SUFENTANIL CITRATE 50 MCG/ML IV SOLN
INTRAVENOUS | Status: AC
  Filled 2014-02-15: qty 1

## 2014-02-15 MED ORDER — MORPHINE SULFATE (PF) 1 MG/ML IV SOLN
INTRAVENOUS | Status: DC
  Administered 2014-02-15 (×2): via INTRAVENOUS
  Filled 2014-02-15 (×2): qty 25

## 2014-02-15 MED ORDER — ONDANSETRON HCL 4 MG/2ML IJ SOLN
INTRAMUSCULAR | Status: AC
  Filled 2014-02-15: qty 2

## 2014-02-15 MED ORDER — DEXAMETHASONE SODIUM PHOSPHATE 10 MG/ML IJ SOLN
INTRAMUSCULAR | Status: AC
  Filled 2014-02-15: qty 1

## 2014-02-15 MED ORDER — LEVOTHYROXINE SODIUM 100 MCG IV SOLR
25.0000 ug | Freq: Every day | INTRAVENOUS | Status: DC
  Administered 2014-02-16 – 2014-02-17 (×2): 25 ug via INTRAVENOUS
  Filled 2014-02-15 (×5): qty 5

## 2014-02-15 MED ORDER — CIPROFLOXACIN IN D5W 400 MG/200ML IV SOLN
INTRAVENOUS | Status: AC
  Filled 2014-02-15: qty 200

## 2014-02-15 MED ORDER — MORPHINE SULFATE (PF) 1 MG/ML IV SOLN
INTRAVENOUS | Status: AC
  Administered 2014-02-15: 1 mg
  Filled 2014-02-15: qty 25

## 2014-02-15 MED ORDER — DEXAMETHASONE SODIUM PHOSPHATE 10 MG/ML IJ SOLN
INTRAMUSCULAR | Status: DC | PRN
  Administered 2014-02-15: 10 mg via INTRAVENOUS

## 2014-02-15 MED ORDER — PHENYLEPHRINE HCL 10 MG/ML IJ SOLN
INTRAMUSCULAR | Status: DC | PRN
  Administered 2014-02-15 (×3): 80 ug via INTRAVENOUS

## 2014-02-15 MED ORDER — SODIUM CHLORIDE 0.9 % IJ SOLN
INTRAMUSCULAR | Status: AC
  Filled 2014-02-15: qty 10

## 2014-02-15 MED ORDER — MORPHINE SULFATE 2 MG/ML IJ SOLN
1.0000 mg | INTRAMUSCULAR | Status: DC | PRN
  Administered 2014-02-16 – 2014-02-18 (×6): 2 mg via INTRAVENOUS
  Filled 2014-02-15 (×5): qty 1

## 2014-02-15 MED ORDER — PROPOFOL 10 MG/ML IV BOLUS
INTRAVENOUS | Status: DC | PRN
  Administered 2014-02-15: 150 mg via INTRAVENOUS

## 2014-02-15 MED ORDER — PANTOPRAZOLE SODIUM 40 MG IV SOLR
40.0000 mg | Freq: Every day | INTRAVENOUS | Status: DC
Start: 2014-02-15 — End: 2014-02-17
  Administered 2014-02-16 (×2): 40 mg via INTRAVENOUS
  Filled 2014-02-15 (×4): qty 40

## 2014-02-15 MED ORDER — BUPIVACAINE ON-Q PAIN PUMP (FOR ORDER SET NO CHG)
INJECTION | Status: DC
  Filled 2014-02-15: qty 1

## 2014-02-15 MED ORDER — METRONIDAZOLE IN NACL 5-0.79 MG/ML-% IV SOLN
500.0000 mg | INTRAVENOUS | Status: AC
  Administered 2014-02-15: 500 mg via INTRAVENOUS

## 2014-02-15 MED ORDER — SODIUM CHLORIDE 0.9 % IV SOLN
10.0000 mg | INTRAVENOUS | Status: DC | PRN
  Administered 2014-02-15: 50 ug/min via INTRAVENOUS

## 2014-02-15 MED ORDER — NALOXONE HCL 0.4 MG/ML IJ SOLN
INTRAMUSCULAR | Status: AC
  Administered 2014-02-15: 23:00:00
  Filled 2014-02-15: qty 1

## 2014-02-15 MED ORDER — ACETAMINOPHEN 10 MG/ML IV SOLN
1000.0000 mg | Freq: Four times a day (QID) | INTRAVENOUS | Status: AC
  Administered 2014-02-15 – 2014-02-16 (×4): 1000 mg via INTRAVENOUS
  Filled 2014-02-15 (×5): qty 100

## 2014-02-15 SURGICAL SUPPLY — 79 items
APPLIER CLIP ROT 10 11.4 M/L (STAPLE)
BLADE 11 SAFETY STRL DISP (BLADE) ×3 IMPLANT
BLADE HEX COATED 2.75 (ELECTRODE) ×3 IMPLANT
CATH ROBINSON RED A/P 18FR (CATHETERS) ×3 IMPLANT
CLAMP ENDO BABCK 10MM (STAPLE) IMPLANT
CLIP APPLIE ROT 10 11.4 M/L (STAPLE) IMPLANT
CLIP LIGATING HEMOLOK MED (MISCELLANEOUS) ×3 IMPLANT
COVER MAYO STAND STRL (DRAPES) ×3 IMPLANT
DERMABOND ADVANCED (GAUZE/BANDAGES/DRESSINGS)
DERMABOND ADVANCED .7 DNX12 (GAUZE/BANDAGES/DRESSINGS) IMPLANT
DRAPE LAPAROSCOPIC ABDOMINAL (DRAPES) ×3 IMPLANT
DRAPE WARM FLUID 44X44 (DRAPE) ×6 IMPLANT
DRSG TELFA 4X14 ISLAND ADH (GAUZE/BANDAGES/DRESSINGS) ×3 IMPLANT
ELECT REM PT RETURN 9FT ADLT (ELECTROSURGICAL) ×3
ELECTRODE REM PT RTRN 9FT ADLT (ELECTROSURGICAL) ×1 IMPLANT
ENDOLOOP SUT PDS II  0 18 (SUTURE)
ENDOLOOP SUT PDS II 0 18 (SUTURE) IMPLANT
GLOVE BIO SURGEON STRL SZ7 (GLOVE) ×12 IMPLANT
GLOVE BIOGEL PI IND STRL 7.0 (GLOVE) ×2 IMPLANT
GLOVE BIOGEL PI IND STRL 7.5 (GLOVE) ×3 IMPLANT
GLOVE BIOGEL PI INDICATOR 7.0 (GLOVE) ×4
GLOVE BIOGEL PI INDICATOR 7.5 (GLOVE) ×6
GOWN STRL REIN 2XL LVL4 (GOWN DISPOSABLE) ×6 IMPLANT
GOWN STRL REUS W/ TWL XL LVL3 (GOWN DISPOSABLE) IMPLANT
GOWN STRL REUS W/TWL LRG LVL3 (GOWN DISPOSABLE) IMPLANT
GOWN STRL REUS W/TWL XL LVL3 (GOWN DISPOSABLE) ×9 IMPLANT
KIT BASIN OR (CUSTOM PROCEDURE TRAY) ×3 IMPLANT
LIGASURE IMPACT 36 18CM CVD LR (INSTRUMENTS) IMPLANT
MARKER SKIN DUAL TIP RULER LAB (MISCELLANEOUS) IMPLANT
NS IRRIG 1000ML POUR BTL (IV SOLUTION) ×6 IMPLANT
PENCIL BUTTON HOLSTER BLD 10FT (ELECTRODE) ×3 IMPLANT
PLUG CATH AND CAP STER (CATHETERS) ×6 IMPLANT
POUCH SPECIMEN RETRIEVAL 10MM (ENDOMECHANICALS) IMPLANT
PUMP PAIN ON-Q (MISCELLANEOUS) ×6 IMPLANT
RELOAD PROXIMATE 75MM BLUE (ENDOMECHANICALS) ×6 IMPLANT
RELOAD PROXIMATE 75MM GREEN (ENDOMECHANICALS) ×3 IMPLANT
RELOAD PROXIMATE TA60MM BLUE (ENDOMECHANICALS) ×3 IMPLANT
RELOAD STAPLER LINE PROX 60 GR (STAPLE) ×1 IMPLANT
SCALPEL HARMONIC ACE (MISCELLANEOUS) IMPLANT
SCISSORS LAP 5X35 DISP (ENDOMECHANICALS) ×3 IMPLANT
SET IRRIG TUBING LAPAROSCOPIC (IRRIGATION / IRRIGATOR) ×6 IMPLANT
SHEARS FOC LG CVD HARMONIC 17C (MISCELLANEOUS) ×3 IMPLANT
SPONGE GAUZE 4X4 12PLY (GAUZE/BANDAGES/DRESSINGS) ×3 IMPLANT
SPONGE LAP 18X18 X RAY DECT (DISPOSABLE) ×6 IMPLANT
STAPLE ECHEON FLEX 60 POW ENDO (STAPLE) IMPLANT
STAPLER 90 3.5 STAND SLIM (STAPLE)
STAPLER 90 3.5 STD SLIM (STAPLE) IMPLANT
STAPLER PROXIMATE 75MM BLUE (STAPLE) ×3 IMPLANT
STAPLER RELOAD LINE PROX 60 GR (STAPLE) ×3
STAPLER VISISTAT 35W (STAPLE) ×3 IMPLANT
SUT ETHILON 2 0 PS N (SUTURE) ×6 IMPLANT
SUT MNCRL AB 4-0 PS2 18 (SUTURE) IMPLANT
SUT PDS AB 1 TP1 96 (SUTURE) ×6 IMPLANT
SUT PDS AB 3-0 SH 27 (SUTURE) ×12 IMPLANT
SUT SILK 2 0 (SUTURE) ×2
SUT SILK 2 0 SH CR/8 (SUTURE) ×3 IMPLANT
SUT SILK 2 0SH CR/8 30 (SUTURE) ×3 IMPLANT
SUT SILK 2-0 18XBRD TIE 12 (SUTURE) ×1 IMPLANT
SUT SILK 3 0 (SUTURE)
SUT SILK 3 0 SH CR/8 (SUTURE) ×3 IMPLANT
SUT SILK 3-0 18XBRD TIE 12 (SUTURE) IMPLANT
SUT VICRYL 3 0 (SUTURE) ×3 IMPLANT
SYR BULB IRRIGATION 50ML (SYRINGE) ×3 IMPLANT
TIP INNERVISION DETACH 40FR (MISCELLANEOUS) IMPLANT
TIP INNERVISION DETACH 50FR (MISCELLANEOUS) IMPLANT
TIP INNERVISION DETACH 56FR (MISCELLANEOUS) IMPLANT
TIPS INNERVISION DETACH 40FR (MISCELLANEOUS)
TOWEL OR 17X26 10 PK STRL BLUE (TOWEL DISPOSABLE) ×9 IMPLANT
TRAY FOLEY CATH 14FRSI W/METER (CATHETERS) IMPLANT
TRAY FOLEY CATH 16FRSI W/METER (SET/KITS/TRAYS/PACK) ×3 IMPLANT
TRAY LAP CHOLE (CUSTOM PROCEDURE TRAY) ×3 IMPLANT
TROCAR BLADELESS OPT 5 75 (ENDOMECHANICALS) ×3 IMPLANT
TROCAR XCEL BLUNT TIP 100MML (ENDOMECHANICALS) ×3 IMPLANT
TROCAR XCEL NON-BLD 11X100MML (ENDOMECHANICALS) IMPLANT
TROCAR XCEL UNIV SLVE 11M 100M (ENDOMECHANICALS) IMPLANT
TUBING INSUFFLATION 10FT LAP (TUBING) ×3 IMPLANT
TUNNELER SHEATH ON-Q 16GX12 DP (PAIN MANAGEMENT) ×3 IMPLANT
YANKAUER SUCT BULB TIP 10FT TU (MISCELLANEOUS) ×3 IMPLANT
YANKAUER SUCT BULB TIP NO VENT (SUCTIONS) ×6 IMPLANT

## 2014-02-15 NOTE — Anesthesia Preprocedure Evaluation (Signed)
Anesthesia Evaluation  Patient identified by MRN, date of birth, ID band Patient awake    Reviewed: Allergy & Precautions, H&P , NPO status , Patient's Chart, lab work & pertinent test results  Airway       Dental   Pulmonary former smoker,  Areas of bilateral pulmonary fibrosis, most pronounced peripherally throughout all lung zones, slightly more prominent in the lung bases. Biapical nodular densities likely reflect scarring/ fibrosis and are stable.          Cardiovascular negative cardio ROS      Neuro/Psych negative neurological ROS  negative psych ROS   GI/Hepatic Neg liver ROS, GERD-  Medicated,  Endo/Other  Hypothyroidism   Renal/GU negative Renal ROS  negative genitourinary   Musculoskeletal negative musculoskeletal ROS (+)   Abdominal   Peds negative pediatric ROS (+)  Hematology  (+) anemia ,   Anesthesia Other Findings   Reproductive/Obstetrics negative OB ROS                           Anesthesia Physical Anesthesia Plan  ASA: III  Anesthesia Plan: General   Post-op Pain Management:    Induction: Intravenous  Airway Management Planned: Oral ETT  Additional Equipment:   Intra-op Plan:   Post-operative Plan: Possible Post-op intubation/ventilation  Informed Consent: I have reviewed the patients History and Physical, chart, labs and discussed the procedure including the risks, benefits and alternatives for the proposed anesthesia with the patient or authorized representative who has indicated his/her understanding and acceptance.   Dental advisory given  Plan Discussed with: CRNA and Surgeon  Anesthesia Plan Comments:         Anesthesia Quick Evaluation

## 2014-02-15 NOTE — H&P (View-Only) (Signed)
HISTORY: Pt is a 80 yo F with carcinoma of gastric antrum who is here for follow up after neoadjuvant chemoradiation.  His dysphagia has improved.  He is still having good days and days where he gets weak easily.  I did get scans and prealbumin.  Scans did not show evidence of metastatic disease.  Prealbumin was normal at 17.  He is here with his children.      PERTINENT REVIEW OF SYSTEMS: Otherwise negative x 11 other than HPI  Filed Vitals:   01/30/14 0941  BP: 136/72  Pulse: 75  Temp: 97.5 F (36.4 C)   Wt Readings from Last 3 Encounters:  01/30/14 169 lb (76.658 kg)  01/26/14 168 lb 5 oz (76.346 kg)  01/23/14 167 lb 3.2 oz (75.841 kg)    EXAM: Pt not examined today.       ASSESSMENT AND PLAN:   Cancer of antrum of stomach Patient has reasonable nutritional status and based on his activity level is a reasonable surgical candidate. We had a long discussion about surgery and postoperative recovery. We also discussed the risks of surgery.  We reviewed that I would do a diagnostic laparoscopy. If I found carcinomatosis, I would abort the procedure.  I also reviewed that based on his age, if I found that a total gastrectomy would be needed to get a negative margin, I would not perform gastrectomy.  I reviewed that I would place a feeding tube for him postoperatively because of the risk of delayed gastric emptying as well as anastomotic swelling.  We discussed risks including bleeding, infection, damage to adjacent structures, blood clots, leak, need for further surgery, death, cardiac or pulmonary complications, and cancer recurrence.  He has an appointment with cardiology today to clear him for surgery. As long as we receive that, we'll then schedule.   Entire visit spent in counseling with pt, son, and daughter\.  Visit time 35 minutes.   We also discussed advanced directives/living will.   Pt consents to temporary interventions, but if he has complications and gets very  sick, he does not want prolonged ICU/ventilator care.    Milus Height, MD Surgical Oncology, [facility]   Melinda Crutch, MD Melinda Crutch, MD

## 2014-02-15 NOTE — Anesthesia Procedure Notes (Signed)
Procedure Name: Intubation Date/Time: 02/15/2014 9:13 AM Performed by: Danley Danker L Patient Re-evaluated:Patient Re-evaluated prior to inductionOxygen Delivery Method: Circle system utilized Preoxygenation: Pre-oxygenation with 100% oxygen Intubation Type: IV induction Ventilation: Mask ventilation without difficulty and Oral airway inserted - appropriate to patient size Laryngoscope Size: Miller and 3 Grade View: Grade I Tube type: Subglottic suction tube Tube size: 8.0 mm Number of attempts: 1 Airway Equipment and Method: Stylet Placement Confirmation: ETT inserted through vocal cords under direct vision,  breath sounds checked- equal and bilateral and positive ETCO2 Secured at: 21 cm Tube secured with: Tape Dental Injury: Teeth and Oropharynx as per pre-operative assessment

## 2014-02-15 NOTE — Interval H&P Note (Signed)
History and Physical Interval Note:  02/15/2014 8:59 AM  Kyle Armstrong  has presented today for surgery, with the diagnosis of gastric cancer   The various methods of treatment have been discussed with the patient and family. After consideration of risks, benefits and other options for treatment, the patient has consented to  Procedure(s): LAPAROSCOPIC diagnositc possible  PARTIAL GASTRECTOMY possible feeding tube  (N/A) as a surgical intervention .  The patient's history has been reviewed, patient examined, no change in status, stable for surgery.  I have reviewed the patient's chart and labs.  Questions were answered to the patient's satisfaction.     Justus Duerr

## 2014-02-15 NOTE — Transfer of Care (Signed)
Immediate Anesthesia Transfer of Care Note  Patient: Kyle Armstrong  Procedure(s) Performed: Procedure(s): LAPAROSCOPIC diagnositc, distal gastrostomy  feeding jejunostomy (N/A)  Patient Location: PACU  Anesthesia Type:General  Level of Consciousness: awake  Airway & Oxygen Therapy: Patient Spontanous Breathing and Patient connected to face mask oxygen  Post-op Assessment: Report given to PACU RN and Post -op Vital signs reviewed and stable  Post vital signs: Reviewed and stable  Complications: No apparent anesthesia complications

## 2014-02-15 NOTE — Preoperative (Signed)
Beta Blockers   Reason not to administer Beta Blockers:Not Applicable 

## 2014-02-15 NOTE — Op Note (Addendum)
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 ulcer  POST-OPERATIVE DIAGNOSIS:  Same  PROCEDURE:  Procedure(s): diagnostic laparoscopy, distal gastrectomy with billroth 2 anastamosis, feeding jejunostomy  SURGEON:  Surgeon(s): Stark Klein, MD  ASSISTANT:   Autumn Messing, MD, Rolm Bookbinder, MD  ANESTHESIA:   local and general  DRAINS: Jejunostomy Tube   LOCAL MEDICATIONS USED:  BUPIVICAINE   SPECIMEN:  Source of Specimen:  distal gastrectomy and gastrohepatic lymph nodes  DISPOSITION OF SPECIMEN:  PATHOLOGY  COUNTS:  YES  DICTATION: .Dragon Dictation  PLAN OF CARE: Admit to inpatient   PATIENT DISPOSITION:  PACU - hemodynamically stable.  EBL:  100 mL  FINDINGS:  Firm site in antrum, no residual mass.  Remainder of stomach is soft.  No evidence of carcinomatosis.  PROCEDURE:    The patient was identified in the holding area and taken to the operating room where he was placed supine on the operating room table. General anesthesia was induced. A Foley catheter was placed. His abdomen was prepped and draped in sterile fashion. Timeout was performed according to the surgical safety checklist. When all was correct, we continued.  A 1.5 cm vertical incision was made in the midline in the supraumbilical location. The simultaneous tissues are divided with a Claiborne Billings. The fascia was elevated in the midline with 2 Kocher clamps. The fascia was incised in the midline with a #11 blade. A 0 Vicryl pursestring suture was placed around the fascial incision. The [facility] trocar was introduced into the abdomen and held in place to the abdominal wall with the tails of the suture. Pneumoperitoneum was achieved to a pressure of 15 mm of mercury.  The abdomen was examined. There was no overt evidence of carcinomatosis. There are a few small white areas that were evaluated with a second port and instrument. These areas that appeared to be soft and fatty in nature. No biopsies were sent. A  midline incision was then made in the upper abdomen extending to just below the umbilicus.  The subcutaneous tissues and fascia were divided with the cautery. The Bookwalter self-retaining retractor was placed for assistance with visualization. The omentum was extremely large. This was taken off of the mesh in the pelvis with the harmonic. The omentum was pulled up. The omentum was divided with the harmonic scalpel. The stomach was then elevated off the colon by opening up the lesser sac with the cautery. With the stomach mobilized, the former mass was identified in the antrum. The omental adhesions were taken off the gallbladder fossa.  The stomach was isolated on both sides and divided with the GIA-75 stapler. The staple line was oversewn with 3-0 PDS. The stomach was then mobilized further and divided just past the pylorus with the TA 60.  The specimen was passed off and sent to pathology for frozen sections of the margin.  And the left gastric and the common hepatic artery were skeletonized her lymph node dissection. There was not a significant issue around this artery. The surrounding tissues were sent as part of the lymph node packet. There were no palpable lymph nodes in the porta hepatis.    At this point, the frozen sections returned as negative at the margins. A feeding jejunostomy was created around 20-30 cm past the ligament of Treitz. A 10 French red rubber catheter was advanced through the abdominal wall.   A pursestring suture of 3-0 Vicryl was placed in the jejunum. The feeding tube was advanced distally through the jejunum and the pursestring suture was  tied down. The tube was pulled with 4 additional Vicryl sutures. The J-tube site was then pexed to the abdominal wall with multiple 2-0 silk sutures. The feeding tube was secured to the abdominal wall with two 2-0 nylon sutures.  The gastrojejunostomy was then created with the jejunum past the ligament of Treitz by attaching the jejunum to the  posterior stomach with 3-0 silk sutures. The stomach and the jejunum were opened and the 75 mm GIA stapler was used to create the anastomosis. There was no overt signs of bleeding. The defect was then closed with 3-0 PDS suture in a running [place] fashion. Prior to complete closure of the anastomosis, the NG tube was advanced distally through the anastomosis. This was secured in place. The abdomen was then irrigated with water. The omentum was pulled down over the top.  The OnQ tunneler sheaths were placed from the superior portion of the incision in the preperitoneal space. The fascia was closed using #1 looped running PDS sutures. The skin was irrigated and closed with skin staples. The On-Q catheters were advanced through the tunneler sheaths and the sheaths were removed. The wound was then cleaned, dried, and dressed with sterile dressings. The patient was awakened from anesthesia in stable condition. He was taken to the PACU. Needle, sponge, and instrument counts were correct x2.

## 2014-02-16 ENCOUNTER — Encounter (HOSPITAL_COMMUNITY): Payer: Self-pay | Admitting: General Surgery

## 2014-02-16 LAB — BASIC METABOLIC PANEL
BUN: 14 mg/dL (ref 6–23)
CALCIUM: 8.5 mg/dL (ref 8.4–10.5)
CO2: 27 meq/L (ref 19–32)
CREATININE: 0.77 mg/dL (ref 0.50–1.35)
Chloride: 100 mEq/L (ref 96–112)
GFR calc Af Amer: >90 mL/min (ref >90)
GFR calc non Af Amer: 83 mL/min — ABNORMAL LOW (ref >90)
Glucose, Bld: 123 mg/dL — ABNORMAL HIGH (ref 70–99)
Potassium: 4.3 mEq/L (ref 3.7–5.3)
Sodium: 133 mEq/L — ABNORMAL LOW (ref 137–147)

## 2014-02-16 LAB — CBC
HCT: 27.2 % — ABNORMAL LOW (ref 39.0–52.0)
Hemoglobin: 9.1 g/dL — ABNORMAL LOW (ref 13.0–17.0)
MCH: 31 pg (ref 26.0–34.0)
MCHC: 33.5 g/dL (ref 30.0–36.0)
MCV: 92.5 fL (ref 78.0–100.0)
Platelets: 140 10*3/uL — ABNORMAL LOW (ref 150–400)
RBC: 2.94 MIL/uL — ABNORMAL LOW (ref 4.22–5.81)
RDW: 14.5 % (ref 11.5–15.5)
WBC: 7.9 10*3/uL (ref 4.0–10.5)

## 2014-02-16 MED ORDER — HYDRALAZINE HCL 20 MG/ML IJ SOLN
20.0000 mg | INTRAMUSCULAR | Status: DC | PRN
  Filled 2014-02-16: qty 1

## 2014-02-16 MED ORDER — DIPHENHYDRAMINE HCL 50 MG/ML IJ SOLN
12.5000 mg | Freq: Four times a day (QID) | INTRAMUSCULAR | Status: DC | PRN
  Administered 2014-02-16: 12.5 mg via INTRAVENOUS

## 2014-02-16 MED ORDER — DIPHENHYDRAMINE HCL 50 MG/ML IJ SOLN
12.5000 mg | Freq: Four times a day (QID) | INTRAMUSCULAR | Status: DC | PRN
  Administered 2014-02-16 (×2): 12.5 mg via INTRAVENOUS
  Filled 2014-02-16 (×3): qty 1

## 2014-02-16 MED ORDER — CHLORHEXIDINE GLUCONATE 0.12 % MT SOLN
15.0000 mL | Freq: Two times a day (BID) | OROMUCOSAL | Status: DC
  Administered 2014-02-16 – 2014-02-24 (×15): 15 mL via OROMUCOSAL
  Filled 2014-02-16 (×17): qty 15

## 2014-02-16 MED ORDER — DIPHENHYDRAMINE-ZINC ACETATE 2-0.1 % EX CREA
TOPICAL_CREAM | Freq: Three times a day (TID) | CUTANEOUS | Status: DC | PRN
  Filled 2014-02-16: qty 28

## 2014-02-16 MED ORDER — HYDROCERIN EX CREA
TOPICAL_CREAM | Freq: Two times a day (BID) | CUTANEOUS | Status: DC | PRN
  Administered 2014-02-16: via TOPICAL
  Filled 2014-02-16: qty 113

## 2014-02-16 NOTE — Progress Notes (Signed)
1 Day Post-Op  Subjective: Had some issues with decreased respirations with morphine last night.    Objective: Vital signs in last 24 hours: Temp:  [96.8 F (36 C)-98.6 F (37 C)] 98.6 F (37 C) (03/26 0548) Pulse Rate:  [71-91] 91 (03/26 0548) Resp:  [8-18] 13 (03/26 0548) BP: (110-163)/(73-88) 110/73 mmHg (03/26 0548) SpO2:  [92 %-100 %] 92 % (03/26 0548) Weight:  [170 lb (77.111 kg)] 170 lb (77.111 kg) (03/25 1700)    Intake/Output from previous day: 03/25 0701 - 03/26 0700 In: 3233.3 [I.V.:3233.3] Out: 2585 [Urine:2430; Emesis/NG output:5; Blood:150] Intake/Output this shift:    General appearance: alert, cooperative and no distress Back: itchy, faint redness Resp: breathing comfortably GI: soft, approp tender, non distended  Lab Results:   Recent Labs  02/15/14 1431 02/16/14 0508  WBC 6.9 7.9  HGB 10.3* 9.1*  HCT 31.4* 27.2*  PLT 135* 140*   BMET  Recent Labs  02/15/14 1431 02/16/14 0508  NA  --  133*  K  --  4.3  CL  --  100  CO2  --  27  GLUCOSE  --  123*  BUN  --  14  CREATININE 0.71 0.77  CALCIUM  --  8.5   PT/INR No results found for this basename: LABPROT, INR,  in the last 72 hours ABG No results found for this basename: PHART, PCO2, PO2, HCO3,  in the last 72 hours  Studies/Results: No results found.  Anti-infectives: Anti-infectives   Start     Dose/Rate Route Frequency Ordered Stop   02/15/14 1800  ciprofloxacin (CIPRO) IVPB 400 mg     400 mg 200 mL/hr over 60 Minutes Intravenous Every 12 hours 02/15/14 1402 02/15/14 1934   02/15/14 1600  metroNIDAZOLE (FLAGYL) IVPB 500 mg     500 mg 100 mL/hr over 60 Minutes Intravenous Every 8 hours 02/15/14 1402 02/16/14 0108   02/15/14 0724  metroNIDAZOLE (FLAGYL) IVPB 500 mg     500 mg 100 mL/hr over 60 Minutes Intravenous On call to O.R. 02/15/14 0724 02/15/14 0930   02/15/14 0724  ciprofloxacin (CIPRO) IVPB 400 mg     400 mg 200 mL/hr over 60 Minutes Intravenous On call to O.R.  02/15/14 0724 02/15/14 1020      Assessment/Plan: s/p Procedure(s): LAPAROSCOPIC diagnositc, distal gastrostomy  feeding jejunostomy (N/A) d/c foley tomorrow AM NPO/NGT.  Hope to d/;c NGT tomorrow. HTN - prn hydralazine Ambulate   LOS: 1 day    [facility] 02/16/2014

## 2014-02-16 NOTE — Care Management Note (Addendum)
    Page 1 of 2   02/23/2014     4:00:10 PM   CARE MANAGEMENT NOTE 02/23/2014  Patient:  Kyle Armstrong, Kyle Armstrong   Account Number:  [id number]  Date Initiated:  02/16/2014  Documentation initiated by:  Sunday Spillers  Subjective/Objective Assessment:   80 yo male admitted s/p lap gastrectomy. PTA lived at home with family.     Action/Plan:   Home when stable. Needs HHPT/RN.   Anticipated DC Date:  02/23/2014   Anticipated DC Plan:  [place]  CM consult      Choice offered to / List presented to:  C-1 Patient   DME arranged  TUBE FEEDING  TUBE FEEDING PUMP      DME agency  [place] arranged  HH-1 RN  [place].   Status of service:  Completed, signed off Medicare Important Message given?  NA - LOS <3 / Initial given by admissions (If response is "NO", the following Medicare IM given date fields will be blank) Date Medicare IM given:   Date Additional Medicare IM given:    Discharge Disposition:  [place]  Per UR Regulation:  Reviewed for med. necessity/level of care/duration of stay  If discussed at [place] of Stay Meetings, dates discussed:    Comments:  02/22/14 Allene Dillon RN BSN [phone] Pt will be needing HHPT/RN at d/c. He is going home on tube feeds. He stated he lives with his son and his daughter assists with his care too. He has chosen [facility] to provide the home health services. He will also need a feeding pump and tube feeds to be ordered. I have asked the bedside RN Pattie to obtain these orders from the md. I have spoken to [place] about the needs.

## 2014-02-17 DIAGNOSIS — Z934 Other artificial openings of gastrointestinal tract status: Secondary | ICD-10-CM

## 2014-02-17 LAB — BASIC METABOLIC PANEL
BUN: 10 mg/dL (ref 6–23)
CALCIUM: 8.4 mg/dL (ref 8.4–10.5)
CO2: 27 mEq/L (ref 19–32)
Chloride: 100 mEq/L (ref 96–112)
Creatinine, Ser: 0.69 mg/dL (ref 0.50–1.35)
GFR calc Af Amer: >90 mL/min (ref >90)
GFR, EST NON AFRICAN AMERICAN: 87 mL/min — AB (ref >90)
Glucose, Bld: 111 mg/dL — ABNORMAL HIGH (ref 70–99)
Potassium: 3.7 mEq/L (ref 3.7–5.3)
Sodium: 136 mEq/L — ABNORMAL LOW (ref 137–147)

## 2014-02-17 LAB — GLUCOSE, CAPILLARY
GLUCOSE-CAPILLARY: 129 mg/dL — AB (ref 70–99)
GLUCOSE-CAPILLARY: 103 mg/dL — AB (ref 70–99)
Glucose-Capillary: 113 mg/dL — ABNORMAL HIGH (ref 70–99)
Glucose-Capillary: 116 mg/dL — ABNORMAL HIGH (ref 70–99)

## 2014-02-17 LAB — CBC
HCT: 28.9 % — ABNORMAL LOW (ref 39.0–52.0)
Hemoglobin: 9.7 g/dL — ABNORMAL LOW (ref 13.0–17.0)
MCH: 30.7 pg (ref 26.0–34.0)
MCHC: 33.6 g/dL (ref 30.0–36.0)
MCV: 91.5 fL (ref 78.0–100.0)
Platelets: 165 10*3/uL (ref 150–400)
RBC: 3.16 MIL/uL — ABNORMAL LOW (ref 4.22–5.81)
RDW: 14.3 % (ref 11.5–15.5)
WBC: 6.3 10*3/uL (ref 4.0–10.5)

## 2014-02-17 MED ORDER — JEVITY 1.5 CAL/FIBER PO LIQD
1000.0000 mL | ORAL | Status: AC
  Administered 2014-02-17: 1000 mL
  Filled 2014-02-17 (×2): qty 1000

## 2014-02-17 MED ORDER — PANTOPRAZOLE SODIUM 40 MG PO PACK
40.0000 mg | PACK | Freq: Every day | ORAL | Status: DC
  Administered 2014-02-17 – 2014-02-24 (×8): 40 mg
  Filled 2014-02-17 (×9): qty 20

## 2014-02-17 MED ORDER — JEVITY 1.5 CAL/FIBER PO LIQD
1000.0000 mL | ORAL | Status: DC
  Filled 2014-02-17: qty 1000

## 2014-02-17 NOTE — Progress Notes (Signed)
Patient ID: Kyle Armstrong, male   DOB: [birth year], 80 y.o.   MRN: [id number] 2 Days Post-Op  Subjective: Pain better controlled last night. No nausea/vomiting.    Objective: Vital signs in last 24 hours: Temp:  [98.1 F (36.7 C)-99.1 F (37.3 C)] 99.1 F (37.3 C) (03/27 0512) Pulse Rate:  [80-87] 87 (03/27 0512) Resp:  [16-18] 16 (03/27 0512) BP: (120-171)/(60-91) 158/91 mmHg (03/27 0512) SpO2:  [98 %-100 %] 99 % (03/27 0512)    Intake/Output from previous day: 03/26 0701 - 03/27 0700 In: 2935.8 [I.V.:2375.8; NG/GT:90; IV Piggyback:400] Out: 4250 [Urine:3550; Emesis/NG output:700] Intake/Output this shift:    General appearance: alert, cooperative and no distress Back: itchy, faint redness Resp: breathing comfortably GI: soft, approp tender, non distended  Lab Results:   Recent Labs  02/16/14 0508 02/17/14 0430  WBC 7.9 6.3  HGB 9.1* 9.7*  HCT 27.2* 28.9*  PLT 140* 165   BMET  Recent Labs  02/16/14 0508 02/17/14 0430  NA 133* 136*  K 4.3 3.7  CL 100 100  CO2 27 27  GLUCOSE 123* 111*  BUN 14 10  CREATININE 0.77 0.69  CALCIUM 8.5 8.4   PT/INR No results found for this basename: LABPROT, INR,  in the last 72 hours ABG No results found for this basename: PHART, PCO2, PO2, HCO3,  in the last 72 hours  Studies/Results: No results found.  Anti-infectives: Anti-infectives   Start     Dose/Rate Route Frequency Ordered Stop   02/15/14 1800  ciprofloxacin (CIPRO) IVPB 400 mg     400 mg 200 mL/hr over 60 Minutes Intravenous Every 12 hours 02/15/14 1402 02/15/14 1934   02/15/14 1600  metroNIDAZOLE (FLAGYL) IVPB 500 mg     500 mg 100 mL/hr over 60 Minutes Intravenous Every 8 hours 02/15/14 1402 02/16/14 0108   02/15/14 0724  metroNIDAZOLE (FLAGYL) IVPB 500 mg     500 mg 100 mL/hr over 60 Minutes Intravenous On call to O.R. 02/15/14 0724 02/15/14 0930   02/15/14 0724  ciprofloxacin (CIPRO) IVPB 400 mg     400 mg 200 mL/hr over 60 Minutes Intravenous On  call to O.R. 02/15/14 0724 02/15/14 1020      Assessment/Plan: s/p Procedure(s): LAPAROSCOPIC diagnositc, distal gastrostomy  feeding jejunostomy (N/A) d/c foley tomorrow AM D/c NGT Add trophic tube feeds. HTN - prn hydralazine Ambulate   LOS: 2 days    [facility] 02/17/2014

## 2014-02-17 NOTE — Progress Notes (Signed)
PHARMACY BRIEF NOTE:  PROTONIX IV TO VT CONVERSION  The patient is receiving Protonix by the intravenous route.  Based on criteria approved by the Pharmacy and [place], the medication is being converted to the equivalent dose via feeding tube.  If there are questions about this conversion, please contact the Pharmacy Department (phone [phone]).    Thank you.  Clayburn Pert, PharmD  [phone]

## 2014-02-17 NOTE — Anesthesia Postprocedure Evaluation (Signed)
  Anesthesia Post-op Note  Patient: Kyle Armstrong  Procedure(s) Performed: Procedure(s) (LRB): LAPAROSCOPIC diagnositc, distal gastrostomy  feeding jejunostomy (N/A)  Patient Location: PACU  Anesthesia Type: General  Level of Consciousness: awake and alert   Airway and Oxygen Therapy: Patient Spontanous Breathing  Post-op Pain: mild  Post-op Assessment: Post-op Vital signs reviewed, Patient's Cardiovascular Status Stable, Respiratory Function Stable, Patent Airway and No signs of Nausea or vomiting  Last Vitals:  Filed Vitals:   02/17/14 0512  BP: 158/91  Pulse: 87  Temp: 37.3 C  Resp: 16    Post-op Vital Signs: stable   Complications: No apparent anesthesia complications

## 2014-02-17 NOTE — Progress Notes (Signed)
INITIAL NUTRITION ASSESSMENT  DOCUMENTATION CODES Per approved criteria  -Not Applicable   INTERVENTION: - Keep TF of Jevity 1.5 at 81ml/hr tonight per telephone discussion with MD  - Starting tomorrow, advance Jevity 1.5 by 4ml every 4 hours to goal of 14ml/hr. Goal rate will provide 2160 calories, 92g protein, 1041ml free water and meet 100% of estimated nutritional needs. Discussed TF and goal rate with pt.  - Initiate adult enteral protocol - Will continue to monitor  NUTRITION DIAGNOSIS: Inadequate oral intake related to inability to eat as evidenced by NPO.   Goal: TF to meet >90% of estimated nutritional needs  Monitor:  Weights, labs, TF tolerance/advancement, diet advancement  Reason for Assessment: Consult for TF management  80 y.o. male  Admitting Dx: Partial gastrectomy with J tube placement  ASSESSMENT: Pt with gastric CA s/p neoadjuvant chemoradiation. Had diagnostic laparoscopy, distal gastrectomy with billroth 2 anastamosis, feeding jejunostomy 02/15/14.   Pt with pt who reports since starting chemoradiation in December, he had problems swallowing and c/o significant weakness in addition to 10 pound unintended weight loss. States there was a time when he could only swallow liquids and he lived off Ensure. Since he finished treatment recently, his appetite and dysphagia have improved in addition to his energy level. Had a steak and baked potato earlier this week.   Nutrition Focused Physical Exam:  Subcutaneous Fat:  Orbital Region: WNL Upper Arm Region: WNL Thoracic and Lumbar Region: NA  Muscle:  Temple Region: WNL Clavicle Bone Region: mild/moderate fat loss Clavicle and Acromion Bone Region: mild/moderate fat loss Scapular Bone Region: NA Dorsal Hand: WNL Patellar Region: WNL Anterior Thigh Region: WNL Posterior Calf Region: WNL  Edema: None noted    Height: Ht Readings from Last 1 Encounters:  02/15/14 5\' 10"  (1.778 m)    Weight: Wt  Readings from Last 1 Encounters:  02/15/14 170 lb (77.111 kg)    Ideal Body Weight: 166 lbs  % Ideal Body Weight: 102%  Wt Readings from Last 10 Encounters:  02/15/14 170 lb (77.111 kg)  02/15/14 170 lb (77.111 kg)  02/09/14 170 lb 6.4 oz (77.293 kg)  01/30/14 171 lb (77.565 kg)  01/30/14 169 lb (76.658 kg)  01/26/14 168 lb 5 oz (76.346 kg)  01/23/14 167 lb 3.2 oz (75.841 kg)  01/12/14 169 lb 3.2 oz (76.749 kg)  12/26/13 175 lb 11.2 oz (79.697 kg)  12/21/13 174 lb 1.6 oz (78.971 kg)    Usual Body Weight: 180 lbs per pt  % Usual Body Weight: 94%  BMI:  Body mass index is 24.39 kg/(m^2).  Estimated Nutritional Needs: Kcal: 2000-2300 Protein: 90-115g Fluid: 2-2.3L/day   Skin: Abdominal incision   Diet Order:  NPO  EDUCATION NEEDS: -No education needs identified at this time   Intake/Output Summary (Last 24 hours) at 02/17/14 0855 Last data filed at 02/17/14 0741  Gross per 24 hour  Intake 3093.34 ml  Output   4350 ml  Net -1256.66 ml    Last BM: 3/18  Labs:   Recent Labs Lab 02/15/14 1431 02/16/14 0508 02/17/14 0430  NA  --  133* 136*  K  --  4.3 3.7  CL  --  100 100  CO2  --  27 27  BUN  --  14 10  CREATININE 0.71 0.77 0.69  CALCIUM  --  8.5 8.4  GLUCOSE  --  123* 111*    CBG (last 3)  No results found for this basename: GLUCAP,  in the last  72 hours  Scheduled Meds: . chlorhexidine  15 mL Mouth/Throat BID  . enoxaparin (LOVENOX) injection  40 mg Subcutaneous Q24H  . feeding supplement (JEVITY 1.5 CAL/FIBER)  1,000 mL Per Tube Q24H  . levothyroxine  25 mcg Intravenous QAC breakfast  . pantoprazole (PROTONIX) IV  40 mg Intravenous QHS    Continuous Infusions: . bupivacaine ON-Q pain pump    . dextrose 5 % and 0.45 % NaCl with KCl 20 mEq/L 75 mL/hr at 02/17/14 0741    Past Medical History  Diagnosis Date  . Arthritis   . 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 Hypothyroidism   . Hyperlipidemia   .  Pernicious anemia   . Allergy   . Gastric cancer 10/24/2013  . Hx of radiation therapy 11/10/13-12/21/13    gastric ca, total 50.4Gy  . Eczema   . Lazy eye of right side     Past Surgical History  Procedure Laterality Date  . 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 Hernia repair  1980's    inguinal done x 2  . Inguinal hernia repair Bilateral yrs ago  . Cholecystectomy  last few years  . Laparoscopic partial gastrectomy N/A 02/15/2014    Procedure: LAPAROSCOPIC diagnositc, distal gastrostomy  feeding jejunostomy;  Surgeon: Stark Klein, MD;  Location: [facility];  Service: General;  Laterality: N/A;    Mikey College MS, [place], [place] Pager [phone] After Hours Pager

## 2014-02-18 ENCOUNTER — Encounter (HOSPITAL_COMMUNITY): Payer: Self-pay | Admitting: Surgery

## 2014-02-18 DIAGNOSIS — E43 Unspecified severe protein-calorie malnutrition: Secondary | ICD-10-CM | POA: Diagnosis present

## 2014-02-18 DIAGNOSIS — H9190 Unspecified hearing loss, unspecified ear: Secondary | ICD-10-CM | POA: Diagnosis present

## 2014-02-18 DIAGNOSIS — E46 Unspecified protein-calorie malnutrition: Secondary | ICD-10-CM

## 2014-02-18 LAB — CBC
HEMATOCRIT: 29 % — AB (ref 39.0–52.0)
HEMOGLOBIN: 9.3 g/dL — AB (ref 13.0–17.0)
MCH: 29.8 pg (ref 26.0–34.0)
MCHC: 32.1 g/dL (ref 30.0–36.0)
MCV: 92.9 fL (ref 78.0–100.0)
Platelets: 163 10*3/uL (ref 150–400)
RBC: 3.12 MIL/uL — ABNORMAL LOW (ref 4.22–5.81)
RDW: 14.3 % (ref 11.5–15.5)
WBC: 5.4 10*3/uL (ref 4.0–10.5)

## 2014-02-18 LAB — GLUCOSE, CAPILLARY
GLUCOSE-CAPILLARY: 125 mg/dL — AB (ref 70–99)
GLUCOSE-CAPILLARY: 136 mg/dL — AB (ref 70–99)
Glucose-Capillary: 106 mg/dL — ABNORMAL HIGH (ref 70–99)
Glucose-Capillary: 114 mg/dL — ABNORMAL HIGH (ref 70–99)
Glucose-Capillary: 116 mg/dL — ABNORMAL HIGH (ref 70–99)
Glucose-Capillary: 150 mg/dL — ABNORMAL HIGH (ref 70–99)

## 2014-02-18 LAB — BASIC METABOLIC PANEL
BUN: 10 mg/dL (ref 6–23)
CHLORIDE: 99 meq/L (ref 96–112)
CO2: 27 mEq/L (ref 19–32)
Calcium: 8.6 mg/dL (ref 8.4–10.5)
Creatinine, Ser: 0.71 mg/dL (ref 0.50–1.35)
GFR calc Af Amer: >90 mL/min (ref >90)
GFR calc non Af Amer: 86 mL/min — ABNORMAL LOW (ref >90)
Glucose, Bld: 117 mg/dL — ABNORMAL HIGH (ref 70–99)
Potassium: 3.7 mEq/L (ref 3.7–5.3)
SODIUM: 134 meq/L — AB (ref 137–147)

## 2014-02-18 MED ORDER — PROMETHAZINE HCL 25 MG/ML IJ SOLN
6.2500 mg | Freq: Four times a day (QID) | INTRAMUSCULAR | Status: DC | PRN
  Administered 2014-02-18 – 2014-02-19 (×2): 6.25 mg via INTRAVENOUS
  Administered 2014-02-19: 18:00:00 via INTRAVENOUS
  Administered 2014-02-23: 6.25 mg via INTRAVENOUS
  Filled 2014-02-18 (×4): qty 1

## 2014-02-18 MED ORDER — MAGIC MOUTHWASH
15.0000 mL | Freq: Four times a day (QID) | ORAL | Status: DC | PRN
  Administered 2014-02-19 – 2014-02-20 (×2): 15 mL via ORAL
  Filled 2014-02-18: qty 15

## 2014-02-18 MED ORDER — LEVOTHYROXINE SODIUM 50 MCG PO TABS
50.0000 ug | ORAL_TABLET | Freq: Every morning | ORAL | Status: DC
  Administered 2014-02-18 – 2014-02-24 (×7): 50 ug via ORAL
  Filled 2014-02-18 (×9): qty 1

## 2014-02-18 MED ORDER — ALUM & MAG HYDROXIDE-SIMETH 200-200-20 MG/5ML PO SUSP
30.0000 mL | Freq: Four times a day (QID) | ORAL | Status: DC | PRN
  Administered 2014-02-22 – 2014-02-24 (×3): 30 mL via ORAL
  Filled 2014-02-18 (×3): qty 30

## 2014-02-18 MED ORDER — JEVITY 1.5 CAL/FIBER PO LIQD
1000.0000 mL | ORAL | Status: DC
  Filled 2014-02-18: qty 1000

## 2014-02-18 MED ORDER — BISACODYL 10 MG RE SUPP
10.0000 mg | Freq: Every day | RECTAL | Status: DC
  Administered 2014-02-18 – 2014-02-23 (×4): 10 mg via RECTAL
  Filled 2014-02-18 (×6): qty 1

## 2014-02-18 MED ORDER — ACETAMINOPHEN 160 MG/5ML PO SOLN: 325.0000 mg | Freq: Four times a day (QID) | ORAL | Status: DC | PRN

## 2014-02-18 MED ORDER — LACTATED RINGERS IV BOLUS (SEPSIS): 1000.0000 mL | Freq: Three times a day (TID) | INTRAVENOUS | Status: AC | PRN

## 2014-02-18 MED ORDER — FAMOTIDINE IN NACL 20-0.9 MG/50ML-% IV SOLN
20.0000 mg | Freq: Two times a day (BID) | INTRAVENOUS | Status: DC
  Administered 2014-02-18 – 2014-02-24 (×12): 20 mg via INTRAVENOUS
  Filled 2014-02-18 (×14): qty 50

## 2014-02-18 MED ORDER — LIP MEDEX EX OINT
1.0000 "application " | TOPICAL_OINTMENT | Freq: Two times a day (BID) | CUTANEOUS | Status: DC
  Administered 2014-02-19 – 2014-02-24 (×10): 1 via TOPICAL
  Filled 2014-02-18 (×2): qty 7

## 2014-02-18 MED ORDER — ACETAMINOPHEN 650 MG RE SUPP: 650.0000 mg | Freq: Four times a day (QID) | RECTAL | Status: DC | PRN

## 2014-02-18 NOTE — Evaluation (Signed)
Physical Therapy Evaluation Patient Details Name: Kyle Armstrong MRN: [id number] DOB: Jan 24, 1930 Today's Date: 02/18/2014   History of Present Illness  Pt with gastric CA and s/p LAPAROSCOPIC diagnositc, distal gastrostomy  feeding jejunostomy   Clinical Impression  Pt admitted with above. Pt currently with functional limitations due to the deficits listed below (see PT Problem List).  Pt will benefit from skilled PT to increase their independence and safety with mobility to allow discharge to the venue listed below. Pt with good family support.       Follow Up Recommendations Home health PT    Equipment Recommendations  Rolling walker with 5" wheels;3in1 (PT)    Recommendations for Other Services       Precautions / Restrictions Restrictions Weight Bearing Restrictions: No      Mobility  Bed Mobility Overal bed mobility: Needs Assistance Bed Mobility: Supine to Sit     Supine to sit: Min assist     General bed mobility comments: A at end of transfer to get trunk fully upright.  Transfers Overall transfer level: Needs assistance Equipment used: Rolling walker (2 wheeled) Transfers: Sit to/from Stand Sit to Stand: Min guard;From elevated surface         General transfer comment: cues to not pull on RW  Ambulation/Gait Ambulation/Gait assistance: Min guard Ambulation Distance (Feet): 150 Feet Assistive device: Rolling walker (2 wheeled) Gait Pattern/deviations: Step-through pattern;Trunk flexed Gait velocity: decreased   General Gait Details: cues for posture and to step closer to RW.  Amb on room air with O2 95%  Stairs            Wheelchair Mobility    Modified Rankin (Stroke Patients Only)       Balance Overall balance assessment: No apparent balance deficits (not formally assessed)                                   Pertinent Vitals/Pain Pt reports pain only with burping and coughing.  o2 95% on RA, nursing notified.     Home Living Family/patient expects to be discharged to:: Private residence Living Arrangements: Children Available Help at Discharge: Family Type of Home: House Home Access: Level entry     Home Layout: One [place]: [place] - single point;Walker - 2 wheels Additional Comments: Works 3 days a week at [employer]    Prior Function Level of Independence: Independent               Journalist, newspaper        Extremity/Trunk Assessment   Upper Extremity Assessment: Defer to OT evaluation           Lower Extremity Assessment: Overall WFL for tasks assessed      Cervical / Trunk Assessment: Normal  Communication   Communication: HOH  Cognition Arousal/Alertness: Awake/alert Behavior During Therapy: WFL for tasks assessed/performed Overall Cognitive Status: Within Functional Limits for tasks assessed                      General Comments      Exercises        Assessment/Plan    PT Assessment Patient needs continued PT services  PT Diagnosis Difficulty walking   PT Problem List Decreased strength;Decreased mobility;Decreased balance  PT Treatment Interventions Gait training;Functional mobility training;Therapeutic activities;Therapeutic exercise   PT Goals (Current goals can be found in the Care Plan section) Acute Rehab PT  Goals Patient Stated Goal: Go home PT Goal Formulation: With patient/family Time For Goal Achievement: 03/04/14 Potential to Achieve Goals: Good    Frequency Min 3X/week   Barriers to discharge        End of Session Equipment Utilized During Treatment: Gait belt Activity Tolerance: Patient tolerated treatment well Patient left: in chair;with family/visitor present         Time: 1610-9604 PT Time Calculation (min): 31 min   Charges:   PT Evaluation $Initial PT Evaluation Tier I: 1 Procedure PT Treatments $Gait Training: 8-22 mins   PT G Codes:          Kyle Armstrong 02/18/2014, 4:26 PM

## 2014-02-18 NOTE — Progress Notes (Signed)
Farwell, MD, FACS   [address], [place], [place] [address] Phone: [phone] FAX: [phone]    Kyle Armstrong [id number] 1930-06-11  CARE TEAM:  PCP:  Melinda Crutch, MD  Outpatient Care Team: Patient Care Team: Melinda Crutch, MD as PCP - General (Family Medicine)  Inpatient Treatment Team: Treatment Team: Attending Provider: Stark Klein, MD; Registered Nurse: Rise Patience, RN; Technician: Leda Quail, NT; Registered Nurse: Kristopher Oppenheim, RN; Dietitian: Christie Beckers, RD; Consulting Physician: Pedro Earls, MD; Registered Nurse: Claretta Fraise, RN; Technician: Senaida Ores Debbink, NT   Subjective:  PAin controlled No nausea Walking little  Objective:  Vital signs:  Filed Vitals:   02/17/14 0900 02/17/14 1400 02/17/14 2015 02/18/14 0513  BP:  134/86 164/87 147/90  Pulse:  77 94 96  Temp:  97.9 F (36.6 C) 98.2 F (36.8 C) 98.3 F (36.8 C)  TempSrc:  Oral Oral Oral  Resp:  _0 Height:      Weight: 174 lb 6.4 oz (79.107 kg)   175 lb 0.7 oz (79.4 kg)  SpO2:  100% 99% 98%    Last BM Date: 02/08/14  Intake/Output   Yesterday:  03/27 0701 - 03/28 0700 In: 1941.3 [I.V.:1831.3; NG/GT:80] Out: 2100 [Urine:2000; Emesis/NG output:100] This shift:     Bowel function:  Flatus: min  BM: no  Drain: n/a  Physical Exam:  General: Pt awake/alert/oriented x4 in no acute distress Eyes: PERRL, normal EOM.  Sclera clear.  No icterus Neuro: CN II-XII intact w/o focal sensory/motor deficits. Lymph: No head/neck/groin lymphadenopathy Psych:  No delerium/psychosis/paranoia HENT: Normocephalic, Mucus membranes moist.  No thrush Neck: Supple, No tracheal deviation Chest: No chest wall pain w good excursion CV:  Pulses intact.  Regular rhythm MS: Normal AROM mjr joints.  No obvious deformity Abdomen: Soft.  Nondistended.  Dressings removed.  Incisions clean.   Mildly tender at incisions only.  No evidence of peritonitis.  No incarcerated hernias. Ext:  SCDs BLE.  No mjr edema.  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 male  3 Days Post-Op  Procedure(s): LAPAROSCOPIC diagnositc, distal gastrostomy  feeding jejunostomy  Stabilizing  Plan:  -adv TF to goal slowly -try clears only -PPI levothyroxine for hypothyroidism -VTE prophylaxis- SCDs, etc -mobilize as tolerated to help recovery.  PT/OT evals given slow mobility.  ?[place] ?SNF  Adin Hector, M.D., F.A.C.S. Gastrointestinal and Minimally Invasive Surgery [facility] [address], [place], [place] [address] [phone] Main / Paging   02/18/2014   Results:   Labs: Results for orders placed during the hospital encounter of 02/15/14 (from the past 48 hour(s))  CBC     Status: Abnormal   Collection Time    02/17/14  4:30 AM      Result Value Ref Range   WBC 6.3  4.0 - 10.5 K/uL   RBC 3.16 (*) 4.22 - 5.81 MIL/uL   Hemoglobin 9.7 (*) 13.0 - 17.0 g/dL   HCT 28.9 (*) 39.0 - 52.0 %   MCV 91.5  78.0 - 100.0 fL   MCH 30.7  26.0 - 34.0 pg   MCHC 33.6  30.0 - 36.0  g/dL   RDW 14.3  11.5 - 15.5 %   Platelets 165  150 - 400 K/uL  BASIC METABOLIC PANEL     Status: Abnormal   Collection Time    02/17/14  4:30 AM      Result Value Ref Range   Sodium 136 (*) 137 - 147 mEq/L   Potassium 3.7  3.7 - 5.3 mEq/L   Chloride 100  96 - 112 mEq/L   CO2 27  19 - 32 mEq/L   Glucose, Bld 111 (*) 70 - 99 mg/dL   BUN 10  6 - 23 mg/dL   Creatinine, Ser 0.69  0.50 - 1.35 mg/dL   Calcium 8.4  8.4 - 10.5 mg/dL   GFR calc non Af Amer 87 (*) >90 mL/min   GFR calc Af Amer >90  >90 mL/min   Comment: (NOTE)     The eGFR has been  calculated using the CKD EPI equation.     This calculation has not been validated in all clinical situations.     eGFR's persistently <90 mL/min signify possible Chronic Kidney     Disease.  GLUCOSE, CAPILLARY     Status: Abnormal   Collection Time    02/17/14 11:53 AM      Result Value Ref Range   Glucose-Capillary 129 (*) 70 - 99 mg/dL  GLUCOSE, CAPILLARY     Status: Abnormal   Collection Time    02/17/14  4:20 PM      Result Value Ref Range   Glucose-Capillary 116 (*) 70 - 99 mg/dL  GLUCOSE, CAPILLARY     Status: Abnormal   Collection Time    02/17/14  8:12 PM      Result Value Ref Range   Glucose-Capillary 103 (*) 70 - 99 mg/dL  GLUCOSE, CAPILLARY     Status: Abnormal   Collection Time    02/17/14 11:48 PM      Result Value Ref Range   Glucose-Capillary 113 (*) 70 - 99 mg/dL  CBC     Status: Abnormal   Collection Time    02/18/14  5:17 AM      Result Value Ref Range   WBC 5.4  4.0 - 10.5 K/uL   RBC 3.12 (*) 4.22 - 5.81 MIL/uL   Hemoglobin 9.3 (*) 13.0 - 17.0 g/dL   HCT 29.0 (*) 39.0 - 52.0 %   MCV 92.9  78.0 - 100.0 fL   MCH 29.8  26.0 - 34.0 pg   MCHC 32.1  30.0 - 36.0 g/dL   RDW 14.3  11.5 - 15.5 %   Platelets 163  150 - 400 K/uL  BASIC METABOLIC PANEL     Status: Abnormal   Collection Time    02/18/14  5:17 AM      Result Value Ref Range   Sodium 134 (*) 137 - 147 mEq/L   Potassium 3.7  3.7 - 5.3 mEq/L   Chloride 99  96 - 112 mEq/L   CO2 27  19 - 32 mEq/L   Glucose, Bld 117 (*) 70 - 99 mg/dL   BUN 10  6 - 23 mg/dL   Creatinine, Ser 0.71  0.50 - 1.35 mg/dL   Calcium 8.6  8.4 - 10.5 mg/dL   GFR calc non Af Amer 86 (*) >90 mL/min   GFR calc Af Amer >90  >90 mL/min   Comment: (NOTE)     The eGFR has been calculated using the CKD EPI equation.  This calculation has not been validated in all clinical situations.     eGFR's persistently <90 mL/min signify possible Chronic Kidney     Disease.    Imaging / Studies: No results found.  Medications /  Allergies: per chart  Antibiotics: Anti-infectives   Start     Dose/Rate Route Frequency Ordered Stop   02/15/14 1800  ciprofloxacin (CIPRO) IVPB 400 mg     400 mg 200 mL/hr over 60 Minutes Intravenous Every 12 hours 02/15/14 1402 02/15/14 1934   02/15/14 1600  metroNIDAZOLE (FLAGYL) IVPB 500 mg     500 mg 100 mL/hr over 60 Minutes Intravenous Every 8 hours 02/15/14 1402 02/16/14 0108   02/15/14 0724  metroNIDAZOLE (FLAGYL) IVPB 500 mg     500 mg 100 mL/hr over 60 Minutes Intravenous On call to O.R. 02/15/14 0724 02/15/14 0930   02/15/14 0724  ciprofloxacin (CIPRO) IVPB 400 mg     400 mg 200 mL/hr over 60 Minutes Intravenous On call to O.R. 02/15/14 0724 02/15/14 1020       Note: This dictation was prepared with Dragon/digital dictation along with [employer]. Any transcriptional errors that result from this process are unintentional.

## 2014-02-19 LAB — CBC
HEMATOCRIT: 32.2 % — AB (ref 39.0–52.0)
HEMOGLOBIN: 10.5 g/dL — AB (ref 13.0–17.0)
MCH: 30 pg (ref 26.0–34.0)
MCHC: 32.6 g/dL (ref 30.0–36.0)
MCV: 92 fL (ref 78.0–100.0)
Platelets: 212 10*3/uL (ref 150–400)
RBC: 3.5 MIL/uL — ABNORMAL LOW (ref 4.22–5.81)
RDW: 14.2 % (ref 11.5–15.5)
WBC: 7 10*3/uL (ref 4.0–10.5)

## 2014-02-19 LAB — GLUCOSE, CAPILLARY
GLUCOSE-CAPILLARY: 132 mg/dL — AB (ref 70–99)
GLUCOSE-CAPILLARY: 144 mg/dL — AB (ref 70–99)
GLUCOSE-CAPILLARY: 137 mg/dL — AB (ref 70–99)
Glucose-Capillary: 116 mg/dL — ABNORMAL HIGH (ref 70–99)
Glucose-Capillary: 122 mg/dL — ABNORMAL HIGH (ref 70–99)
Glucose-Capillary: 128 mg/dL — ABNORMAL HIGH (ref 70–99)

## 2014-02-19 LAB — BASIC METABOLIC PANEL
BUN: 15 mg/dL (ref 6–23)
CO2: 25 mEq/L (ref 19–32)
Calcium: 9.1 mg/dL (ref 8.4–10.5)
Chloride: 101 mEq/L (ref 96–112)
Creatinine, Ser: 0.7 mg/dL (ref 0.50–1.35)
GFR calc Af Amer: >90 mL/min (ref >90)
GFR calc non Af Amer: 86 mL/min — ABNORMAL LOW (ref >90)
GLUCOSE: 139 mg/dL — AB (ref 70–99)
POTASSIUM: 4.1 meq/L (ref 3.7–5.3)
Sodium: 135 mEq/L — ABNORMAL LOW (ref 137–147)

## 2014-02-19 MED ORDER — FENTANYL CITRATE 0.05 MG/ML IJ SOLN: 25.0000 ug | INTRAMUSCULAR | Status: DC | PRN

## 2014-02-19 MED ORDER — JEVITY 1.5 CAL/FIBER PO LIQD
1000.0000 mL | ORAL | Status: DC
  Administered 2014-02-19 – 2014-02-22 (×3): 1000 mL
  Filled 2014-02-19 (×4): qty 1000

## 2014-02-19 MED ORDER — LACTATED RINGERS IV BOLUS (SEPSIS)
1000.0000 mL | Freq: Once | INTRAVENOUS | Status: AC
  Administered 2014-02-19: 1000 mL via INTRAVENOUS

## 2014-02-19 NOTE — Progress Notes (Signed)
Pt having episodes of nausea and vomiting and remains unrelieved after administration of zofran and phenergan. Pt emesis noted to be bilious in color. TF residuals checked and no residuals noted and abdomen soft. CCS on call MD D. Ninfa Linden notified and made aware at midnight. Instructed to continue with anti-emetic and maintain aspiration precautions and keep NPO for now and continue to hold TF and will reevaluate in the am. Will continue to monitor.

## 2014-02-19 NOTE — Progress Notes (Signed)
[place], MD, [address] [place], [place] [address] Phone: [phone] FAX: [phone]    WAYMON LASER [id number] [birth year]  CARE TEAM:  PCP:  Melinda Crutch, MD  Outpatient Care Team: Patient Care Team: Melinda Crutch, MD as PCP - General (Family Medicine)  Inpatient Treatment Team: Treatment Team: Attending Provider: Stark Klein, MD; Registered Nurse: Rise Patience, RN; Technician: Leda Quail, NT; Registered Nurse: Kristopher Oppenheim, RN; Dietitian: Christie Beckers, RD; Consulting Physician: Pedro Earls, MD; Registered Nurse: Claretta Fraise, RN; Technician: Senaida Ores Debbink, NT   Subjective:  N/V last night Feels a little better this AM  Objective:  Vital signs:  Filed Vitals:   02/18/14 0513 02/18/14 1400 02/18/14 2131 02/19/14 0531  BP: 147/90 116/74 129/86 132/82  Pulse: 96 85 96 91  Temp: 98.3 F (36.8 C) 97.5 F (36.4 C) 97.9 F (36.6 C) 97.3 F (36.3 C)  TempSrc: Oral Oral Oral Oral  Resp: $Remo'16 16 16 16  'HlnUa$ Height:      Weight: 175 lb 0.7 oz (79.4 kg)   171 lb 1.2 oz (77.6 kg)  SpO2: 98% 100% 98% 92%    Last BM Date: 02/18/14  Intake/Output   Yesterday:  03/28 0701 - 03/29 0700 In: 1748.8 [I.V.:1218.8; NG/GT:400; IV Piggyback:100] Out: 1250 [Urine:950; Emesis/NG output:300] This shift:     Bowel function:  Flatus: min  BM: yes  Drain: n/a  Physical Exam:  General: Pt awake/alert/oriented x4 in no acute distress Eyes: PERRL, normal EOM.  Sclera clear.  No icterus Neuro: CN II-XII intact w/o focal sensory/motor deficits. Lymph: No head/neck/groin lymphadenopathy Psych:  No delerium/psychosis/paranoia HENT: Normocephalic, Mucus membranes moist.  No thrush Neck: Supple, No tracheal deviation Chest: No chest wall pain w good excursion CV:  Pulses intact.  Regular rhythm MS: Normal AROM mjr joints.  No obvious deformity Abdomen: Soft.   Mildly distended.  Incisions clean.  Mildly tender at incisions only.  No evidence of peritonitis.  No incarcerated hernias. Ext:  SCDs BLE.  No mjr edema.  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 male  4 Days Post-Op  Procedure(s): LAPAROSCOPIC diagnositc, distal gastrostomy  feeding jejunostomy  Persistent gastric ileus  Plan:  -trophic low rate TF only -sips only -NGT if vomits again -PPI -levothyroxine for hypothyroidism -VTE prophylaxis- SCDs, etc -mobilize as tolerated to help recovery.  GET HIM UP!!  PT/OT evals given slow mobility.  ?[place] ?SNF  Adin Hector, M.D., F.A.C.S. Gastrointestinal and Minimally Invasive Surgery [facility] [address] [place], [place] [address] [phone] Main / Paging   02/19/2014   Results:   Labs: Results for orders placed during the hospital encounter of 02/15/14 (from the past 48 hour(s))  GLUCOSE, CAPILLARY     Status: Abnormal   Collection Time    02/17/14 11:53 AM      Result Value Ref Range   Glucose-Capillary 129 (*) 70 - 99 mg/dL  GLUCOSE, CAPILLARY     Status: Abnormal   Collection Time    02/17/14  4:20 PM  Result Value Ref Range   Glucose-Capillary 116 (*) 70 - 99 mg/dL  GLUCOSE, CAPILLARY     Status: Abnormal   Collection Time    02/17/14  8:12 PM      Result Value Ref Range   Glucose-Capillary 103 (*) 70 - 99 mg/dL  GLUCOSE, CAPILLARY     Status: Abnormal   Collection Time    02/17/14 11:48 PM      Result Value Ref Range   Glucose-Capillary 113 (*) 70 - 99 mg/dL  GLUCOSE, CAPILLARY     Status: Abnormal   Collection Time    02/18/14  3:29 AM      Result  Value Ref Range   Glucose-Capillary 106 (*) 70 - 99 mg/dL  CBC     Status: Abnormal   Collection Time    02/18/14  5:17 AM      Result Value Ref Range   WBC 5.4  4.0 - 10.5 K/uL   RBC 3.12 (*) 4.22 - 5.81 MIL/uL   Hemoglobin 9.3 (*) 13.0 - 17.0 g/dL   HCT 29.0 (*) 39.0 - 52.0 %   MCV 92.9  78.0 - 100.0 fL   MCH 29.8  26.0 - 34.0 pg   MCHC 32.1  30.0 - 36.0 g/dL   RDW 14.3  11.5 - 15.5 %   Platelets 163  150 - 400 K/uL  BASIC METABOLIC PANEL     Status: Abnormal   Collection Time    02/18/14  5:17 AM      Result Value Ref Range   Sodium 134 (*) 137 - 147 mEq/L   Potassium 3.7  3.7 - 5.3 mEq/L   Chloride 99  96 - 112 mEq/L   CO2 27  19 - 32 mEq/L   Glucose, Bld 117 (*) 70 - 99 mg/dL   BUN 10  6 - 23 mg/dL   Creatinine, Ser 0.71  0.50 - 1.35 mg/dL   Calcium 8.6  8.4 - 10.5 mg/dL   GFR calc non Af Amer 86 (*) >90 mL/min   GFR calc Af Amer >90  >90 mL/min   Comment: (NOTE)     The eGFR has been calculated using the CKD EPI equation.     This calculation has not been validated in all clinical situations.     eGFR's persistently <90 mL/min signify possible Chronic Kidney     Disease.  GLUCOSE, CAPILLARY     Status: Abnormal   Collection Time    02/18/14  8:03 AM      Result Value Ref Range   Glucose-Capillary 114 (*) 70 - 99 mg/dL  GLUCOSE, CAPILLARY     Status: Abnormal   Collection Time    02/18/14 12:56 PM      Result Value Ref Range   Glucose-Capillary 136 (*) 70 - 99 mg/dL  GLUCOSE, CAPILLARY     Status: Abnormal   Collection Time    02/18/14  5:07 PM      Result Value Ref Range   Glucose-Capillary 116 (*) 70 - 99 mg/dL  GLUCOSE, CAPILLARY     Status: Abnormal   Collection Time    02/18/14  7:39 PM      Result Value Ref Range   Glucose-Capillary 150 (*) 70 - 99 mg/dL  GLUCOSE, CAPILLARY     Status: Abnormal   Collection Time    02/18/14 11:42 PM      Result Value Ref Range   Glucose-Capillary 125 (*) 70 - 99 mg/dL  GLUCOSE,  CAPILLARY     Status: Abnormal    Collection Time    02/19/14  3:32 AM      Result Value Ref Range   Glucose-Capillary 132 (*) 70 - 99 mg/dL  CBC     Status: Abnormal   Collection Time    02/19/14  4:46 AM      Result Value Ref Range   WBC 7.0  4.0 - 10.5 K/uL   RBC 3.50 (*) 4.22 - 5.81 MIL/uL   Hemoglobin 10.5 (*) 13.0 - 17.0 g/dL   HCT 32.2 (*) 39.0 - 52.0 %   MCV 92.0  78.0 - 100.0 fL   MCH 30.0  26.0 - 34.0 pg   MCHC 32.6  30.0 - 36.0 g/dL   RDW 14.2  11.5 - 15.5 %   Platelets 212  150 - 400 K/uL   Comment: DELTA CHECK NOTED     REPEATED TO VERIFY  BASIC METABOLIC PANEL     Status: Abnormal   Collection Time    02/19/14  4:46 AM      Result Value Ref Range   Sodium 135 (*) 137 - 147 mEq/L   Potassium 4.1  3.7 - 5.3 mEq/L   Chloride 101  96 - 112 mEq/L   CO2 25  19 - 32 mEq/L   Glucose, Bld 139 (*) 70 - 99 mg/dL   BUN 15  6 - 23 mg/dL   Creatinine, Ser 0.70  0.50 - 1.35 mg/dL   Calcium 9.1  8.4 - 10.5 mg/dL   GFR calc non Af Amer 86 (*) >90 mL/min   GFR calc Af Amer >90  >90 mL/min   Comment: (NOTE)     The eGFR has been calculated using the CKD EPI equation.     This calculation has not been validated in all clinical situations.     eGFR's persistently <90 mL/min signify possible Chronic Kidney     Disease.    Imaging / Studies: No results found.  Medications / Allergies: per chart  Antibiotics: Anti-infectives   Start     Dose/Rate Route Frequency Ordered Stop   02/15/14 1800  ciprofloxacin (CIPRO) IVPB 400 mg     400 mg 200 mL/hr over 60 Minutes Intravenous Every 12 hours 02/15/14 1402 02/15/14 1934   02/15/14 1600  metroNIDAZOLE (FLAGYL) IVPB 500 mg     500 mg 100 mL/hr over 60 Minutes Intravenous Every 8 hours 02/15/14 1402 02/16/14 0108   02/15/14 0724  metroNIDAZOLE (FLAGYL) IVPB 500 mg     500 mg 100 mL/hr over 60 Minutes Intravenous On call to O.R. 02/15/14 0724 02/15/14 0930   02/15/14 0724  ciprofloxacin (CIPRO) IVPB 400 mg     400 mg 200 mL/hr over 60 Minutes Intravenous On  call to O.R. 02/15/14 0724 02/15/14 1020       Note: This dictation was prepared with Dragon/digital dictation along with [employer]. Any transcriptional errors that result from this process are unintentional.

## 2014-02-19 NOTE — Progress Notes (Signed)
Pt feeling nauseous earlier today but just vomited about 300 cc brown liquid. MD told me to replace NGT if pt vomited so I did so. Immediately suctioned up about 1200 cc brown liquid. Pt stated he felt better.

## 2014-02-20 LAB — GLUCOSE, CAPILLARY
Glucose-Capillary: 122 mg/dL — ABNORMAL HIGH (ref 70–99)
Glucose-Capillary: 103 mg/dL — ABNORMAL HIGH (ref 70–99)
Glucose-Capillary: 119 mg/dL — ABNORMAL HIGH (ref 70–99)
Glucose-Capillary: 112 mg/dL — ABNORMAL HIGH (ref 70–99)
Glucose-Capillary: 110 mg/dL — ABNORMAL HIGH (ref 70–99)
Glucose-Capillary: 121 mg/dL — ABNORMAL HIGH (ref 70–99)

## 2014-02-20 LAB — BASIC METABOLIC PANEL
BUN: 21 mg/dL (ref 6–23)
CALCIUM: 8.6 mg/dL (ref 8.4–10.5)
CHLORIDE: 102 meq/L (ref 96–112)
CO2: 26 mEq/L (ref 19–32)
Creatinine, Ser: 0.77 mg/dL (ref 0.50–1.35)
GFR calc Af Amer: >90 mL/min (ref >90)
GFR calc non Af Amer: 83 mL/min — ABNORMAL LOW (ref >90)
Glucose, Bld: 117 mg/dL — ABNORMAL HIGH (ref 70–99)
Potassium: 3.6 mEq/L — ABNORMAL LOW (ref 3.7–5.3)
Sodium: 137 mEq/L (ref 137–147)

## 2014-02-20 LAB — CBC
HCT: 28.2 % — ABNORMAL LOW (ref 39.0–52.0)
Hemoglobin: 9 g/dL — ABNORMAL LOW (ref 13.0–17.0)
MCH: 29.4 pg (ref 26.0–34.0)
MCHC: 31.9 g/dL (ref 30.0–36.0)
MCV: 92.2 fL (ref 78.0–100.0)
PLATELETS: 163 10*3/uL (ref 150–400)
RBC: 3.06 MIL/uL — ABNORMAL LOW (ref 4.22–5.81)
RDW: 14 % (ref 11.5–15.5)
WBC: 4.6 10*3/uL (ref 4.0–10.5)

## 2014-02-20 NOTE — Progress Notes (Signed)
Patient ID: Kyle Armstrong, male   DOB: 25-Jun-1930, 80 y.o.   MRN: [id number] [facility] Progress Note:   5 Days Post-Op  Subjective: Mental status is clear impaired by hearing difficulty.  Up walking the halls with PT Objective: Vital signs in last 24 hours: Temp:  [97.8 F (36.6 C)-98.5 F (36.9 C)] 97.8 F (36.6 C) (03/30 1400) Pulse Rate:  [84-86] 86 (03/30 1400) Resp:  [16-18] 18 (03/30 1400) BP: (108-130)/(72-79) 130/78 mmHg (03/30 1400) SpO2:  [95 %-98 %] 96 % (03/30 1400) Weight:  [167 lb 12.3 oz (76.1 kg)] 167 lb 12.3 oz (76.1 kg) (03/30 0543)  Intake/Output from previous day: 03/29 0701 - 03/30 0700 In: 2910 [I.V.:1200; NG/GT:600; IV Piggyback:1050] Out: 36 [Urine:900; Emesis/NG output:2050; Drains:1000] Intake/Output this shift: Total I/O In: 260 [I.V.:200; NG/GT:60] Out: 150 [Emesis/NG output:150]  Physical Exam: Work of breathing is more rapid due to exertion.  No abdominal complaints except j tube site discomfort.    Lab Results:  Results for orders placed during the hospital encounter of 02/15/14 (from the past 48 hour(s))  GLUCOSE, CAPILLARY     Status: Abnormal   Collection Time    02/18/14 11:42 PM      Result Value Ref Range   Glucose-Capillary 125 (*) 70 - 99 mg/dL  GLUCOSE, CAPILLARY     Status: Abnormal   Collection Time    02/19/14  3:32 AM      Result Value Ref Range   Glucose-Capillary 132 (*) 70 - 99 mg/dL  CBC     Status: Abnormal   Collection Time    02/19/14  4:46 AM      Result Value Ref Range   WBC 7.0  4.0 - 10.5 K/uL   RBC 3.50 (*) 4.22 - 5.81 MIL/uL   Hemoglobin 10.5 (*) 13.0 - 17.0 g/dL   HCT 32.2 (*) 39.0 - 52.0 %   MCV 92.0  78.0 - 100.0 fL   MCH 30.0  26.0 - 34.0 pg   MCHC 32.6  30.0 - 36.0 g/dL   RDW 14.2  11.5 - 15.5 %   Platelets 212  150 - 400 K/uL   Comment: DELTA CHECK NOTED     REPEATED TO VERIFY  BASIC METABOLIC PANEL     Status: Abnormal   Collection Time    02/19/14  4:46 AM      Result Value Ref  Range   Sodium 135 (*) 137 - 147 mEq/L   Potassium 4.1  3.7 - 5.3 mEq/L   Chloride 101  96 - 112 mEq/L   CO2 25  19 - 32 mEq/L   Glucose, Bld 139 (*) 70 - 99 mg/dL   BUN 15  6 - 23 mg/dL   Creatinine, Ser 0.70  0.50 - 1.35 mg/dL   Calcium 9.1  8.4 - 10.5 mg/dL   GFR calc non Af Amer 86 (*) >90 mL/min   GFR calc Af Amer >90  >90 mL/min   Comment: (NOTE)     The eGFR has been calculated using the CKD EPI equation.     This calculation has not been validated in all clinical situations.     eGFR's persistently <90 mL/min signify possible Chronic Kidney     Disease.  GLUCOSE, CAPILLARY     Status: Abnormal   Collection Time    02/19/14  7:56 AM      Result Value Ref Range   Glucose-Capillary 128 (*) 70 - 99 mg/dL  GLUCOSE, CAPILLARY  Status: Abnormal   Collection Time    02/19/14 12:23 PM      Result Value Ref Range   Glucose-Capillary 122 (*) 70 - 99 mg/dL  GLUCOSE, CAPILLARY     Status: Abnormal   Collection Time    02/19/14  4:55 PM      Result Value Ref Range   Glucose-Capillary 144 (*) 70 - 99 mg/dL  GLUCOSE, CAPILLARY     Status: Abnormal   Collection Time    02/19/14  7:40 PM      Result Value Ref Range   Glucose-Capillary 137 (*) 70 - 99 mg/dL  GLUCOSE, CAPILLARY     Status: Abnormal   Collection Time    02/19/14 11:33 PM      Result Value Ref Range   Glucose-Capillary 116 (*) 70 - 99 mg/dL  GLUCOSE, CAPILLARY     Status: Abnormal   Collection Time    02/20/14  3:19 AM      Result Value Ref Range   Glucose-Capillary 122 (*) 70 - 99 mg/dL  CBC     Status: Abnormal   Collection Time    02/20/14  4:32 AM      Result Value Ref Range   WBC 4.6  4.0 - 10.5 K/uL   RBC 3.06 (*) 4.22 - 5.81 MIL/uL   Hemoglobin 9.0 (*) 13.0 - 17.0 g/dL   HCT 28.2 (*) 39.0 - 52.0 %   MCV 92.2  78.0 - 100.0 fL   MCH 29.4  26.0 - 34.0 pg   MCHC 31.9  30.0 - 36.0 g/dL   RDW 14.0  11.5 - 15.5 %   Platelets 163  150 - 400 K/uL   Comment: SPECIMEN CHECKED FOR CLOTS     DELTA CHECK  NOTED  BASIC METABOLIC PANEL     Status: Abnormal   Collection Time    02/20/14  4:32 AM      Result Value Ref Range   Sodium 137  137 - 147 mEq/L   Potassium 3.6 (*) 3.7 - 5.3 mEq/L   Chloride 102  96 - 112 mEq/L   CO2 26  19 - 32 mEq/L   Glucose, Bld 117 (*) 70 - 99 mg/dL   BUN 21  6 - 23 mg/dL   Creatinine, Ser 0.77  0.50 - 1.35 mg/dL   Calcium 8.6  8.4 - 10.5 mg/dL   GFR calc non Af Amer 83 (*) >90 mL/min   GFR calc Af Amer >90  >90 mL/min   Comment: (NOTE)     The eGFR has been calculated using the CKD EPI equation.     This calculation has not been validated in all clinical situations.     eGFR's persistently <90 mL/min signify possible Chronic Kidney     Disease.  GLUCOSE, CAPILLARY     Status: Abnormal   Collection Time    02/20/14  8:05 AM      Result Value Ref Range   Glucose-Capillary 103 (*) 70 - 99 mg/dL  GLUCOSE, CAPILLARY     Status: Abnormal   Collection Time    02/20/14 11:59 AM      Result Value Ref Range   Glucose-Capillary 119 (*) 70 - 99 mg/dL  GLUCOSE, CAPILLARY     Status: Abnormal   Collection Time    02/20/14  4:21 PM      Result Value Ref Range   Glucose-Capillary 112 (*) 70 - 99 mg/dL  GLUCOSE, CAPILLARY     Status:  Abnormal   Collection Time    02/20/14  7:30 PM      Result Value Ref Range   Glucose-Capillary 110 (*) 70 - 99 mg/dL    Radiology/Results: No results found.  Anti-infectives: Anti-infectives   Start     Dose/Rate Route Frequency Ordered Stop   02/15/14 1800  ciprofloxacin (CIPRO) IVPB 400 mg     400 mg 200 mL/hr over 60 Minutes Intravenous Every 12 hours 02/15/14 1402 02/15/14 1934   02/15/14 1600  metroNIDAZOLE (FLAGYL) IVPB 500 mg     500 mg 100 mL/hr over 60 Minutes Intravenous Every 8 hours 02/15/14 1402 02/16/14 0108   02/15/14 0724  metroNIDAZOLE (FLAGYL) IVPB 500 mg     500 mg 100 mL/hr over 60 Minutes Intravenous On call to O.R. 02/15/14 0724 02/15/14 0930   02/15/14 0724  ciprofloxacin (CIPRO) IVPB 400 mg      400 mg 200 mL/hr over 60 Minutes Intravenous On call to O.R. 02/15/14 0724 02/15/14 1020      Assessment/Plan: Problem List: Patient Active Problem List   Diagnosis Date Noted  . HOH (hard of hearing) 02/18/2014  . Mild malnutrition 02/18/2014  . Jejunostomy tube present 02/17/2014  . 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 Hyperlipidemia   . Pernicious anemia   . Hypothyroidism   . Arthritis   . SVT (supraventricular tachycardia) 06/23/2012    Hg drifting downward.  Will recheck. In AM 5 Days Post-Op    LOS: 5 days   Matt B. Hassell Done, MD, [facility] [phone] beeper [phone]  02/20/2014 9:16 PM

## 2014-02-20 NOTE — Progress Notes (Signed)
Physical Therapy Treatment Patient Details Name: Kyle Armstrong MRN: [id number] DOB: [birth year] Today's Date: 02/20/2014    History of Present Illness Pt with gastric CA and s/p LAPAROSCOPIC diagnositc, distal gastrostomy  feeding jejunostomy     PT Comments    Pt OOB in recliner.  RN clamped NG tube so we could amb pt in hallway.  Pt tolerated 2 laps around.  Assisted pt back to bed per request.   Follow Up Recommendations  Home health PT     Equipment Recommendations  Rolling walker with 5" wheels;3in1 (PT)    Recommendations for Other Services       Precautions / Restrictions Precautions Precaution Comments: NG tube, multiple lines Restrictions Weight Bearing Restrictions: No    Mobility  Bed Mobility Overal bed mobility: Needs Assistance Bed Mobility: Sit to Supine     Supine to sit: Min guard Sit to supine: Min guard   General bed mobility comments: assisted back to bed  Transfers Overall transfer level: Needs assistance Equipment used: Rolling walker (2 wheeled) Transfers: Sit to/from Stand Sit to Stand: Supervision;Min guard         General transfer comment: cues to not pull on RW  Ambulation/Gait Ambulation/Gait assistance: Min guard;Supervision Ambulation Distance (Feet): 950 Feet Assistive device: Rolling walker (2 wheeled) Gait Pattern/deviations: Step-through pattern;Trunk flexed Gait velocity: decreased   General Gait Details: cues for posture and to step closer to RW.  Amb on room air with O2 95%   Stairs            Wheelchair Mobility    Modified Rankin (Stroke Patients Only)       Balance                                    Cognition Arousal/Alertness: Awake/alert Behavior During Therapy: WFL for tasks assessed/performed Overall Cognitive Status: Within Functional Limits for tasks assessed                      Exercises      General Comments        Pertinent Vitals/Pain     Home  Living Family/patient expects to be discharged to:: Private residence Living Arrangements: Children Available Help at Discharge: Family Type of Home: House Home Access: Level entry   Home Layout: One level Home Equipment: Cane - single point;Walker - 2 wheels Additional Comments: Works 3 days a week at [employer]    Prior Function Level of Independence: Independent          PT Goals (current goals can now be found in the care plan section) Acute Rehab PT Goals Patient Stated Goal: Go home Progress towards PT goals: Progressing toward goals    Frequency  Min 3X/week    PT Plan      End of Session Equipment Utilized During Treatment: Gait belt Activity Tolerance: Patient tolerated treatment well Patient left: in bed;with call bell/phone within reach     Time: 1017-1042 PT Time Calculation (min): 25 min  Charges:  $Gait Training: 23-37 mins                    G Codes:      Rica Koyanagi  PTA [facility]  Acute  Rehab Pager      [phone]

## 2014-02-20 NOTE — Plan of Care (Signed)
Problem: Phase II Progression Outcomes Goal: Pain controlled Outcome: Completed/Met Date Met:  02/20/14 Pt denies any pain Goal: Progressing with IS, TCDB Outcome: Completed/Met Date Met:  02/20/14 Pt using IS regularly. Pt TCDB as tolerated. Goal: Surgical site without signs of infection Outcome: Completed/Met Date Met:  02/20/14 Surgical site CDI Goal: Return of bowel function (flatus, BM) IF ABDOMINAL SURGERY:  Outcome: Progressing Pt had small bowel movement today. Bowel sounds active.

## 2014-02-20 NOTE — Progress Notes (Signed)
NUTRITION FOLLOW UP  Intervention:   - Continue to monitor TF as ordered by MD, recommend Jevity 1.5 with goal of 53ml/hr, increasing to goal rate per MD  - Will continue to monitor   Nutrition Dx:   Inadequate oral intake related to inability to eat as evidenced by NPO - ongoing    Goal:   TF to meet >90% of estimated nutritional needs - not met   Monitor:   Weights, labs, TF advancement, nausea/vomiting, NGT output  Assessment:   Pt with gastric CA s/p neoadjuvant chemoradiation. Had diagnostic laparoscopy, distal gastrectomy with billroth 2 anastamosis, feeding jejunostomy 02/15/14.   3/27 - Pt with pt who reports since starting chemoradiation in December, he had problems swallowing and c/o significant weakness in addition to 10 pound unintended weight loss. States there was a time when he could only swallow liquids and he lived off Ensure. Since he finished treatment recently, his appetite and dysphagia have improved in addition to his energy level. Had a steak and baked potato earlier this week.   3/30 - Noted pt started having nausea/vomiting yesterday early morning with emesis bilious in color and TF was held. Vomited 39ml of brown liquid last night and NGT placed with 1.2L brown liquid immediately suctioned out yesterday. Pt asleep during RD visit today, did not disturb. Pt with 234ml green output from NGT so far today. Noted plans to increase TF by 8ml every 6 hours to goal of 38ml/hr of Jevity 1.5 via J tube per surgeon's notes.    Current TF: Jevity 1.5 at 70ml/hr via J tube - provides 720 calories, 31g protein and meets 36% estimated calorie needs and 34% estimated protein needs   Height: Ht Readings from Last 1 Encounters:  02/15/14 $RemoveB'5\' 10"'BSYotYAy$  (1.778 m)    Weight Status:   Wt Readings from Last 1 Encounters:  02/20/14 167 lb 12.3 oz (76.1 kg)  Admit wt:        170 lb (77.1 kg)  Re-estimated needs:  Kcal: 2000-2300  Protein: 90-115g  Fluid: 2-2.3L/day    Skin:  Abdominal incision  Diet Order: NPO   Intake/Output Summary (Last 24 hours) at 02/20/14 1246 Last data filed at 02/20/14 1000  Gross per 24 hour  Intake   2870 ml  Output   4401 ml  Net  -1531 ml    Last BM: 3/28   Labs:   Recent Labs Lab 02/18/14 0517 02/19/14 0446 02/20/14 0432  NA 134* 135* 137  K 3.7 4.1 3.6*  CL 99 101 102  CO2 $Re'27 25 26  'AcY$ BUN $R'10 15 21  'hI$ CREATININE 0.71 0.70 0.77  CALCIUM 8.6 9.1 8.6  GLUCOSE 117* 139* 117*    CBG (last 3)   Recent Labs  02/20/14 0319 02/20/14 0805 02/20/14 1159  GLUCAP 122* 103* 119*    Scheduled Meds: . bisacodyl  10 mg Rectal Daily  . chlorhexidine  15 mL Mouth/Throat BID  . enoxaparin (LOVENOX) injection  40 mg Subcutaneous Q24H  . famotidine (PEPCID) IV  20 mg Intravenous Q12H  . levothyroxine  50 mcg Oral q morning - 10a  . lip balm  1 application Topical BID  . pantoprazole sodium  40 mg Per Tube Daily    Continuous Infusions: . dextrose 5 % and 0.45 % NaCl with KCl 20 mEq/L 50 mL/hr at 02/20/14 0236  . feeding supplement (JEVITY 1.5 CAL/FIBER) 1,000 mL (02/19/14 1448)     [place], RD, LDN [phone] Pager [phone] After Hours Pager

## 2014-02-20 NOTE — Evaluation (Signed)
Occupational Therapy Evaluation Patient Details Name: Kyle Armstrong MRN: [id number] DOB: 06-23-1930 Today's Date: 03-18-14    History of Present Illness Pt with gastric CA and s/p LAPAROSCOPIC diagnositc, distal gastrostomy  feeding jejunostomy    Clinical Impression   Pt presents to OT with decreased I with ADL activity due to problems listed below.  Pt will benefit from skilled OT to increase I with ADL activity and return to PLOF    Follow Up Recommendations  No OT follow up    Equipment Recommendations  None recommended by OT       Precautions / Restrictions Precautions Precaution Comments: NG tube, multiple lines Restrictions Weight Bearing Restrictions: No      Mobility Bed Mobility Overal bed mobility: Needs Assistance Bed Mobility: Supine to Sit     Supine to sit: Min guard     General bed mobility comments: A at end of transfer to get trunk fully upright.  Transfers Overall transfer level: Needs assistance Equipment used: Rolling walker (2 wheeled)   Sit to Stand: Min guard;From elevated surface         General transfer comment: cues to not pull on RW         ADL   Grooming: Supervision/safety;Standing   Upper Body Dressing : Set up;Sitting Lower Body Bathing: Minimal assistance Lower Body Dressing: Minimal assistance;Sit to/from stand Toilet Transfer: Min guard;Comfort height toilet Toileting- Water quality scientist and Hygiene: Min guard;Sit to/from stand           Vision                     Perception      Extremity/Trunk Assessment Upper Extremity Assessment Upper Extremity Assessment: Generalized weakness           Communication Communication Communication: HOH   Cognition Arousal/Alertness: Awake/alert Behavior During Therapy: WFL for tasks assessed/performed Overall Cognitive Status: Within Functional Limits for tasks assessed                     General Comments       Exercises      Home  Living Family/patient expects to be discharged to:: Private residence Living Arrangements: Children Available Help at Discharge: Family Type of Home: House Home Access: Level entry     Home Layout: One level     Bathroom Shower/Tub: Occupational psychologist: Standard     Home Equipment: Cane - single point;Walker - 2 wheels   Additional Comments: Works 3 days a week at [employer]      Prior Functioning/Environment Level of Independence: Independent                OT Problem List: Decreased strength   OT Treatment/Interventions: Self-care/ADL training;Energy conservation;Patient/family education;DME and/or AE instruction    OT Goals(Current goals can be found in the care plan section) Acute Rehab OT Goals Patient Stated Goal: Go home OT Goal Formulation: With patient Time For Goal Achievement: 02/27/14 Potential to Achieve Goals: Good  OT Frequency: Min 2X/week   Barriers to D/C:            End of Session:    Activity Tolerance: Patient tolerated treatment well Patient left: in bed   Time: 1340-1359 OT Time Calculation (min): 19 min Charges:  OT General Charges $OT Visit: 1 Procedure OT Evaluation $Initial OT Evaluation Tier I: 1 Procedure OT Treatments $Self Care/Home Management : 8-22 mins G-Codes:    Betsy Pries 18-Mar-2014, 2:08  PM

## 2014-02-21 LAB — GLUCOSE, CAPILLARY
GLUCOSE-CAPILLARY: 114 mg/dL — AB (ref 70–99)
GLUCOSE-CAPILLARY: 115 mg/dL — AB (ref 70–99)
GLUCOSE-CAPILLARY: 125 mg/dL — AB (ref 70–99)
Glucose-Capillary: 111 mg/dL — ABNORMAL HIGH (ref 70–99)
Glucose-Capillary: 123 mg/dL — ABNORMAL HIGH (ref 70–99)
Glucose-Capillary: 98 mg/dL (ref 70–99)

## 2014-02-21 LAB — CBC
HEMATOCRIT: 27.9 % — AB (ref 39.0–52.0)
Hemoglobin: 9.2 g/dL — ABNORMAL LOW (ref 13.0–17.0)
MCH: 30.5 pg (ref 26.0–34.0)
MCHC: 33 g/dL (ref 30.0–36.0)
MCV: 92.4 fL (ref 78.0–100.0)
Platelets: 181 10*3/uL (ref 150–400)
RBC: 3.02 MIL/uL — ABNORMAL LOW (ref 4.22–5.81)
RDW: 14.2 % (ref 11.5–15.5)
WBC: 4.6 10*3/uL (ref 4.0–10.5)

## 2014-02-21 NOTE — Progress Notes (Signed)
Patient ID: Kyle Armstrong, male   DOB: 1930-01-21, 80 y.o.   MRN: [id number] [facility] Surgery Progress Note:   6 Days Post-Op  Subjective: Mental status is clear.  Had 2 bms and much flatus Objective: Vital signs in last 24 hours: Temp:  [97.8 F (36.6 C)-98.8 F (37.1 C)] 98.8 F (37.1 C) (03/31 0500) Pulse Rate:  [83-86] 83 (03/31 0500) Resp:  [16-18] 18 (03/31 0500) BP: (109-130)/(61-78) 109/61 mmHg (03/31 0500) SpO2:  [93 %-96 %] 94 % (03/31 0500) Weight:  [161 lb 9.6 oz (73.3 kg)] 161 lb 9.6 oz (73.3 kg) (03/31 0500)  Intake/Output from previous day: 03/30 0701 - 03/31 0700 In: 2260 [P.O.:60; I.V.:1200; NG/GT:900; IV Piggyback:100] Out: 2051 [Urine:1350; Emesis/NG output:700; Stool:1] Intake/Output this shift: Total I/O In: 60 [NG/GT:60] Out: -   Physical Exam: Work of breathing is normal.  Incison OK  Lab Results:  Results for orders placed during the hospital encounter of 02/15/14 (from the past 48 hour(s))  GLUCOSE, CAPILLARY     Status: Abnormal   Collection Time    02/19/14 12:23 PM      Result Value Ref Range   Glucose-Capillary 122 (*) 70 - 99 mg/dL  GLUCOSE, CAPILLARY     Status: Abnormal   Collection Time    02/19/14  4:55 PM      Result Value Ref Range   Glucose-Capillary 144 (*) 70 - 99 mg/dL  GLUCOSE, CAPILLARY     Status: Abnormal   Collection Time    02/19/14  7:40 PM      Result Value Ref Range   Glucose-Capillary 137 (*) 70 - 99 mg/dL  GLUCOSE, CAPILLARY     Status: Abnormal   Collection Time    02/19/14 11:33 PM      Result Value Ref Range   Glucose-Capillary 116 (*) 70 - 99 mg/dL  GLUCOSE, CAPILLARY     Status: Abnormal   Collection Time    02/20/14  3:19 AM      Result Value Ref Range   Glucose-Capillary 122 (*) 70 - 99 mg/dL  CBC     Status: Abnormal   Collection Time    02/20/14  4:32 AM      Result Value Ref Range   WBC 4.6  4.0 - 10.5 K/uL   RBC 3.06 (*) 4.22 - 5.81 MIL/uL   Hemoglobin 9.0 (*) 13.0 - 17.0 g/dL   HCT 28.2  (*) 39.0 - 52.0 %   MCV 92.2  78.0 - 100.0 fL   MCH 29.4  26.0 - 34.0 pg   MCHC 31.9  30.0 - 36.0 g/dL   RDW 14.0  11.5 - 15.5 %   Platelets 163  150 - 400 K/uL   Comment: SPECIMEN CHECKED FOR CLOTS     DELTA CHECK NOTED  BASIC METABOLIC PANEL     Status: Abnormal   Collection Time    02/20/14  4:32 AM      Result Value Ref Range   Sodium 137  137 - 147 mEq/L   Potassium 3.6 (*) 3.7 - 5.3 mEq/L   Chloride 102  96 - 112 mEq/L   CO2 26  19 - 32 mEq/L   Glucose, Bld 117 (*) 70 - 99 mg/dL   BUN 21  6 - 23 mg/dL   Creatinine, Ser 0.77  0.50 - 1.35 mg/dL   Calcium 8.6  8.4 - 10.5 mg/dL   GFR calc non Af Amer 83 (*) >90 mL/min   GFR calc  Af Amer >90  >90 mL/min   Comment: (NOTE)     The eGFR has been calculated using the CKD EPI equation.     This calculation has not been validated in all clinical situations.     eGFR's persistently <90 mL/min signify possible Chronic Kidney     Disease.  GLUCOSE, CAPILLARY     Status: Abnormal   Collection Time    02/20/14  8:05 AM      Result Value Ref Range   Glucose-Capillary 103 (*) 70 - 99 mg/dL  GLUCOSE, CAPILLARY     Status: Abnormal   Collection Time    02/20/14 11:59 AM      Result Value Ref Range   Glucose-Capillary 119 (*) 70 - 99 mg/dL  GLUCOSE, CAPILLARY     Status: Abnormal   Collection Time    02/20/14  4:21 PM      Result Value Ref Range   Glucose-Capillary 112 (*) 70 - 99 mg/dL  GLUCOSE, CAPILLARY     Status: Abnormal   Collection Time    02/20/14  7:30 PM      Result Value Ref Range   Glucose-Capillary 110 (*) 70 - 99 mg/dL  GLUCOSE, CAPILLARY     Status: Abnormal   Collection Time    02/20/14 11:25 PM      Result Value Ref Range   Glucose-Capillary 121 (*) 70 - 99 mg/dL  GLUCOSE, CAPILLARY     Status: Abnormal   Collection Time    02/21/14  3:51 AM      Result Value Ref Range   Glucose-Capillary 123 (*) 70 - 99 mg/dL  CBC     Status: Abnormal   Collection Time    02/21/14  4:20 AM      Result Value Ref Range    WBC 4.6  4.0 - 10.5 K/uL   RBC 3.02 (*) 4.22 - 5.81 MIL/uL   Hemoglobin 9.2 (*) 13.0 - 17.0 g/dL   HCT 27.9 (*) 39.0 - 52.0 %   MCV 92.4  78.0 - 100.0 fL   MCH 30.5  26.0 - 34.0 pg   MCHC 33.0  30.0 - 36.0 g/dL   RDW 14.2  11.5 - 15.5 %   Platelets 181  150 - 400 K/uL  GLUCOSE, CAPILLARY     Status: Abnormal   Collection Time    02/21/14  8:04 AM      Result Value Ref Range   Glucose-Capillary 114 (*) 70 - 99 mg/dL   Comment 1 Documented in Chart     Comment 2 Notify RN      Radiology/Results: No results found.  Anti-infectives: Anti-infectives   Start     Dose/Rate Route Frequency Ordered Stop   02/15/14 1800  ciprofloxacin (CIPRO) IVPB 400 mg     400 mg 200 mL/hr over 60 Minutes Intravenous Every 12 hours 02/15/14 1402 02/15/14 1934   02/15/14 1600  metroNIDAZOLE (FLAGYL) IVPB 500 mg     500 mg 100 mL/hr over 60 Minutes Intravenous Every 8 hours 02/15/14 1402 02/16/14 0108   02/15/14 0724  metroNIDAZOLE (FLAGYL) IVPB 500 mg     500 mg 100 mL/hr over 60 Minutes Intravenous On call to O.R. 02/15/14 0724 02/15/14 0930   02/15/14 0724  ciprofloxacin (CIPRO) IVPB 400 mg     400 mg 200 mL/hr over 60 Minutes Intravenous On call to O.R. 02/15/14 0724 02/15/14 1020      Assessment/Plan: Problem List: Patient Active Problem List  Diagnosis Date Noted  . HOH (hard of hearing) 02/18/2014  . Mild malnutrition 02/18/2014  . Jejunostomy tube present 02/17/2014  . 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 Hyperlipidemia   . Pernicious anemia   . Hypothyroidism   . Arthritis   . SVT (supraventricular tachycardia) 06/23/2012    Will discontinue NG and start clears.   6 Days Post-Op    LOS: 6 days   Matt B. Hassell Done, MD, [facility] [phone] beeper [phone]  02/21/2014 10:01 AM

## 2014-02-21 NOTE — Progress Notes (Signed)
Physical Therapy Treatment Patient Details Name: Kyle Armstrong MRN: [id number] DOB: 02-28-1930 Today's Date: 02/21/2014    History of Present Illness Pt with gastric CA and s/p LAPAROSCOPIC diagnositc, distal gastrostomy  feeding jejunostomy     PT Comments    NG tube D/C and pt eating some of his liquid lunch feeling better.  Assisted out of recliner to amb in hallway then back to bed.    Follow Up Recommendations  Home health PT     Equipment Recommendations  Rolling walker with 5" wheels;3in1 (PT)    Recommendations for Other Services       Precautions / Restrictions Precautions Precautions: Fall Restrictions Weight Bearing Restrictions: No    Mobility  Bed Mobility Overal bed mobility: Needs Assistance Bed Mobility: Sit to Supine       Sit to supine: Min guard   General bed mobility comments: assisted back to bed  Transfers Overall transfer level: Needs assistance Equipment used: Rolling walker (2 wheeled) Transfers: Sit to/from Stand Sit to Stand: Supervision Stand pivot transfers: Supervision       General transfer comment: cues to not pull on RW and cues to reach back prior to sit  Ambulation/Gait Ambulation/Gait assistance: Min guard;Supervision Ambulation Distance (Feet): 975 Feet Assistive device: Rolling walker (2 wheeled) Gait Pattern/deviations: Step-through pattern;Trunk flexed Gait velocity: decreased   General Gait Details: <25% VC's for proper walker to self distance and upright posture.   Stairs            Wheelchair Mobility    Modified Rankin (Stroke Patients Only)       Balance                                    Cognition Arousal/Alertness: Awake/alert Behavior During Therapy: WFL for tasks assessed/performed Overall Cognitive Status: Within Functional Limits for tasks assessed                      Exercises Other Exercises Other Exercises: Educated on BUE strengthening ex with level 2  theraband    General Comments        Pertinent Vitals/Pain     Home Living                      Prior Function            PT Goals (current goals can now be found in the care plan section) Acute Rehab PT Goals Patient Stated Goal: Go home Progress towards PT goals: Progressing toward goals    Frequency  Min 3X/week    PT Plan      End of Session Equipment Utilized During Treatment: Gait belt Activity Tolerance: Patient tolerated treatment well Patient left: in bed;with call bell/phone within reach     Time: 1500-1525 PT Time Calculation (min): 25 min  Charges:  $Gait Training: 8-22 mins $Therapeutic Activity: 8-22 mins                    G Codes:      Rica Koyanagi  PTA [facility]  Acute  Rehab Pager      [phone]

## 2014-02-21 NOTE — Progress Notes (Signed)
Occupational Therapy Treatment Patient Details Name: Kyle Armstrong MRN: [id number] DOB: Oct 31, 1930 Today's Date: 02/21/2014    History of present illness Pt with gastric CA and s/p LAPAROSCOPIC diagnositc, distal gastrostomy  feeding jejunostomy    OT comments  Making steady progress. Increasing activity level slowly. Educated pt on compensatory techniques for ADL to reduce stress on abdomen. Pt has all AE/DME needed and will have adequate support for D/C home. Very sweet and motivated to return to PLOF. Will continue to follow to facilitate D/C home with S.  Follow Up Recommendations  No OT follow up;Supervision - Intermittent    Equipment Recommendations  None recommended by OT    Recommendations for Other Services      Precautions / Restrictions         Mobility Bed Mobility Overal bed mobility: Needs Assistance Bed Mobility: Supine to Sit     Supine to sit: Supervision (educated on need to roll to side to decrease pressure/stress)        Transfers Overall transfer level: Needs assistance   Transfers: Sit to/from Stand;Stand Pivot Transfers Sit to Stand: Supervision Stand pivot transfers: Supervision            Balance Overall balance assessment: Needs assistance         Standing balance support: During functional activity Standing balance-Leahy Scale: Fair                     ADL   Grooming: Supervision/safety (standing)       Lower Body Dressing: Supervision/safety;Sit to/from stand (educated on compensatory techniques anduse of AE) Toilet Transfer: Supervision/safety;Ambulation;Comfort height toilet Toileting- Clothing Manipulation and Hygiene: Supervision/safety;Sit to/from stand   Functional mobility during ADLs: Min guard General ADL Comments: Increasing independence and safety and endurance for ADL; fatigued after BM      Vision                     Perception     Praxis      Cognition   Behavior During Therapy:  [facility] for tasks assessed/performed Overall Cognitive Status: Within Functional Limits for tasks assessed                       Extremity/Trunk Assessment               Exercises       General Comments      Pertinent Vitals/ Pain       3/10 stomach pain with mobility.  O2 desat to 88 RA during bathroom mobility.  Home Living    see eval                                      Prior Functioning/Environment   see eval           Frequency Min 2X/week     Progress Toward Goals  OT Goals(current goals can now be found in the care plan section)  Progress towards OT goals: Progressing toward goals  Acute Rehab OT Goals Patient Stated Goal: Go home OT Goal Formulation: With patient Time For Goal Achievement: 02/27/14 Potential to Achieve Goals: Good ADL Goals Pt Will Perform Lower Body Dressing: with modified independence;sit to/from stand Pt Will Transfer to Toilet: with modified independence;regular height toilet;ambulating Pt Will Perform Toileting - Clothing Manipulation and hygiene: with modified independence;sit to/from stand Pt Will Perform  Tub/Shower Transfer: with modified independence;Shower transfer;ambulating  Plan Discharge plan remains appropriate    End of Session Equipment Utilized During Treatment: Gait belt;Rolling walker  Activity Tolerance Patient tolerated treatment well   Patient Left in chair;with call bell/phone within reach   Nurse Communication Mobility status        Time: 6314-9702 OT Time Calculation (min): 45 min  Charges: OT General Charges $OT Visit: 1 Procedure OT Treatments $Self Care/Home Management : 38-52 mins  Kyle Armstrong,Kyle Armstrong 02/21/2014, 12:24 PM   [facility], OTR/L  [phone] 02/21/2014

## 2014-02-21 NOTE — Progress Notes (Signed)
Occupational Therapy Treatment Patient Details Name: Kyle Armstrong MRN: [id number] DOB: 16-Jul-1930 Today's Date: 02/21/2014    History of present illness Pt with gastric CA and s/p LAPAROSCOPIC diagnositc, distal gastrostomy  feeding jejunostomy    OT comments  Educated pt on BUE theraband (level 2) ex to complete independently. Also encouraged use on incentive spirometer. Will continue to follow.  Follow Up Recommendations  No OT follow up;Supervision - Intermittent    Equipment Recommendations  None recommended by OT    Recommendations for Other Services      Precautions / Restrictions         Mobility Bed Mobility Overal bed mobility: Needs Assistance Bed Mobility: Supine to Sit     Supine to sit: Supervision (educated on need to roll to side to decrease pressure/stress)        Transfers Overall transfer level: Needs assistance   Transfers: Sit to/from Stand;Stand Pivot Transfers Sit to Stand: Supervision Stand pivot transfers: Supervision            Balance Overall balance assessment: Needs assistance         Standing balance support: During functional activity Standing balance-Leahy Scale: Fair                     ADL   Grooming: Supervision/safety (standing)       Lower Body Dressing: Supervision/safety;Sit to/from stand (educated on compensatory techniques anduse of AE) Toilet Transfer: Supervision/safety;Ambulation;Comfort height toilet Toileting- Clothing Manipulation and Hygiene: Supervision/safety;Sit to/from stand   Functional mobility during ADLs: Min guard General ADL Comments: Increasing independence and safety and endurance for ADL; fatigued after BM      Vision                     Perception     Praxis      Cognition   Behavior During Therapy: [facility] for tasks assessed/performed Overall Cognitive Status: Within Functional Limits for tasks assessed                       Extremity/Trunk Assessment                Exercises Other Exercises Other Exercises: Educated on BUE strengthening ex with level 2 theraband     General Comments      Pertinent Vitals/ Pain       No c/o pain VSS  Home Living    see eval                                      Prior Functioning/Environment     see eval         Frequency Min 2X/week     Progress Toward Goals  OT Goals(current goals can now be found in the care plan section)  Progress towards OT goals: Progressing toward goals  Acute Rehab OT Goals Patient Stated Goal: Go home OT Goal Formulation: With patient Time For Goal Achievement: 02/27/14 Potential to Achieve Goals: Good ADL Goals Pt Will Perform Lower Body Dressing: with modified independence;sit to/from stand Pt Will Transfer to Toilet: with modified independence;regular height toilet;ambulating Pt Will Perform Toileting - Clothing Manipulation and hygiene: with modified independence;sit to/from stand Pt Will Perform Tub/Shower Transfer: with modified independence;Shower transfer;ambulating  Plan Discharge plan remains appropriate    End of Session Equipment Utilized During Treatment: Gait belt;Rolling walker  Activity Tolerance  Patient tolerated treatment well   Patient Left in chair;with call bell/phone within reach   Nurse Communication Mobility status        Time: 9604-5409 OT Time Calculation (min): 12 min  Charges: OT General Charges $OT Visit: 1 Procedure OT Treatments $Self Care/Home Management : 38-52 mins $Therapeutic Activity: 8-22 mins  Anddy Wingert,HILLARY 02/21/2014, 2:39 PM   [facility], OTR/L  [phone] 02/21/2014

## 2014-02-22 LAB — CREATININE, SERUM
CREATININE: 0.76 mg/dL (ref 0.50–1.35)
GFR calc Af Amer: >90 mL/min (ref >90)
GFR calc non Af Amer: 83 mL/min — ABNORMAL LOW (ref >90)

## 2014-02-22 LAB — GLUCOSE, CAPILLARY
GLUCOSE-CAPILLARY: 100 mg/dL — AB (ref 70–99)
GLUCOSE-CAPILLARY: 106 mg/dL — AB (ref 70–99)
GLUCOSE-CAPILLARY: 86 mg/dL (ref 70–99)
Glucose-Capillary: 136 mg/dL — ABNORMAL HIGH (ref 70–99)
Glucose-Capillary: 141 mg/dL — ABNORMAL HIGH (ref 70–99)

## 2014-02-22 NOTE — Progress Notes (Signed)
Patient ID: Kyle Armstrong, male   DOB: 1930-07-14, 80 y.o.   MRN: [id number] [facility] Surgery Progress Note:   7 Days Post-Op  Subjective: Mental status is clear; no nausea after ng discontinued Objective: Vital signs in last 24 hours: Temp:  [98.5 F (36.9 C)-98.6 F (37 C)] 98.6 F (37 C) (04/01 0604) Pulse Rate:  [86-95] 88 (04/01 0604) Resp:  [16] 16 (04/01 0604) BP: (118-130)/(67-86) 118/67 mmHg (04/01 0604) SpO2:  [95 %-100 %] 98 % (04/01 0604)  Intake/Output from previous day: 03/31 0701 - 04/01 0700 In: 1742.5 [P.O.:120; I.V.:1142.5; NG/GT:340; IV Piggyback:100] Out: 650 [Urine:650] Intake/Output this shift: Total I/O In: -  Out: 200 [Urine:200]  Physical Exam: Work of breathing is normal  Lab Results:  Results for orders placed during the hospital encounter of 02/15/14 (from the past 48 hour(s))  GLUCOSE, CAPILLARY     Status: Abnormal   Collection Time    02/20/14 11:59 AM      Result Value Ref Range   Glucose-Capillary 119 (*) 70 - 99 mg/dL  GLUCOSE, CAPILLARY     Status: Abnormal   Collection Time    02/20/14  4:21 PM      Result Value Ref Range   Glucose-Capillary 112 (*) 70 - 99 mg/dL  GLUCOSE, CAPILLARY     Status: Abnormal   Collection Time    02/20/14  7:30 PM      Result Value Ref Range   Glucose-Capillary 110 (*) 70 - 99 mg/dL  GLUCOSE, CAPILLARY     Status: Abnormal   Collection Time    02/20/14 11:25 PM      Result Value Ref Range   Glucose-Capillary 121 (*) 70 - 99 mg/dL  GLUCOSE, CAPILLARY     Status: Abnormal   Collection Time    02/21/14  3:51 AM      Result Value Ref Range   Glucose-Capillary 123 (*) 70 - 99 mg/dL  CBC     Status: Abnormal   Collection Time    02/21/14  4:20 AM      Result Value Ref Range   WBC 4.6  4.0 - 10.5 K/uL   RBC 3.02 (*) 4.22 - 5.81 MIL/uL   Hemoglobin 9.2 (*) 13.0 - 17.0 g/dL   HCT 27.9 (*) 39.0 - 52.0 %   MCV 92.4  78.0 - 100.0 fL   MCH 30.5  26.0 - 34.0 pg   MCHC 33.0  30.0 - 36.0 g/dL   RDW  14.2  11.5 - 15.5 %   Platelets 181  150 - 400 K/uL  GLUCOSE, CAPILLARY     Status: Abnormal   Collection Time    02/21/14  8:04 AM      Result Value Ref Range   Glucose-Capillary 114 (*) 70 - 99 mg/dL   Comment 1 Documented in Chart     Comment 2 Notify RN    GLUCOSE, CAPILLARY     Status: Abnormal   Collection Time    02/21/14 12:23 PM      Result Value Ref Range   Glucose-Capillary 115 (*) 70 - 99 mg/dL   Comment 1 Documented in Chart     Comment 2 Notify RN    GLUCOSE, CAPILLARY     Status: Abnormal   Collection Time    02/21/14  4:00 PM      Result Value Ref Range   Glucose-Capillary 125 (*) 70 - 99 mg/dL  GLUCOSE, CAPILLARY     Status: Abnormal  Collection Time    02/21/14  7:27 PM      Result Value Ref Range   Glucose-Capillary 111 (*) 70 - 99 mg/dL  GLUCOSE, CAPILLARY     Status: None   Collection Time    02/21/14 11:33 PM      Result Value Ref Range   Glucose-Capillary 98  70 - 99 mg/dL  CREATININE, SERUM     Status: Abnormal   Collection Time    02/22/14  4:15 AM      Result Value Ref Range   Creatinine, Ser 0.76  0.50 - 1.35 mg/dL   GFR calc non Af Amer 83 (*) >90 mL/min   GFR calc Af Amer >90  >90 mL/min   Comment: (NOTE)     The eGFR has been calculated using the CKD EPI equation.     This calculation has not been validated in all clinical situations.     eGFR's persistently <90 mL/min signify possible Chronic Kidney     Disease.  GLUCOSE, CAPILLARY     Status: Abnormal   Collection Time    02/22/14  7:44 AM      Result Value Ref Range   Glucose-Capillary 136 (*) 70 - 99 mg/dL    Radiology/Results: No results found.  Anti-infectives: Anti-infectives   Start     Dose/Rate Route Frequency Ordered Stop   02/15/14 1800  ciprofloxacin (CIPRO) IVPB 400 mg     400 mg 200 mL/hr over 60 Minutes Intravenous Every 12 hours 02/15/14 1402 02/15/14 1934   02/15/14 1600  metroNIDAZOLE (FLAGYL) IVPB 500 mg     500 mg 100 mL/hr over 60 Minutes Intravenous  Every 8 hours 02/15/14 1402 02/16/14 0108   02/15/14 0724  metroNIDAZOLE (FLAGYL) IVPB 500 mg     500 mg 100 mL/hr over 60 Minutes Intravenous On call to O.R. 02/15/14 0724 02/15/14 0930   02/15/14 0724  ciprofloxacin (CIPRO) IVPB 400 mg     400 mg 200 mL/hr over 60 Minutes Intravenous On call to O.R. 02/15/14 0724 02/15/14 1020      Assessment/Plan: Problem List: Patient Active Problem List   Diagnosis Date Noted  . HOH (hard of hearing) 02/18/2014  . Mild malnutrition 02/18/2014  . Jejunostomy tube present 02/17/2014  . 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 Hyperlipidemia   . Pernicious anemia   . Hypothyroidism   . Arthritis   . SVT (supraventricular tachycardia) 06/23/2012    Doing well;  Will advance diet to full and hopeful discharge tomorrow 7 Days Post-Op    LOS: 7 days   Matt B. Hassell Done, MD, [facility] [phone] beeper [phone]  02/22/2014 9:11 AM

## 2014-02-22 NOTE — Progress Notes (Signed)
Occupational Therapy Treatment Patient Details Name: Kyle Armstrong MRN: [id number] DOB: [birth year] Today's Date: 02/22/2014    History of present illness Pt with gastric CA and s/p LAPAROSCOPIC diagnositc, distal gastrostomy  feeding jejunostomy       Follow Up Recommendations  No OT follow up;Supervision - Intermittent    Equipment Recommendations  None recommended by OT    Recommendations for Other Services      Precautions / Restrictions Precautions Precaution Comments: NG tube, multiple lines Restrictions Weight Bearing Restrictions: No       Mobility Bed Mobility Overal bed mobility: Needs Assistance Bed Mobility: Sit to Supine       Sit to supine: Min guard   General bed mobility comments: assisted back to bed  Transfers Overall transfer level: Needs assistance Equipment used: Rolling walker (2 wheeled)   Sit to Stand: Supervision Stand pivot transfers: Supervision       General transfer comment: cues to not pull on RW and cues to reach back prior to sit                        Cognition   Behavior During Therapy: [facility] for tasks assessed/performed Overall Cognitive Status: Within Functional Limits for tasks assessed                       Extremity/Trunk Assessment               Exercises Other Exercises Other Exercises: Educated on BUE strengthening ex with level 2 theraband. Pt performed 2 sets of 10 with orange therabad     General Comments       Home Living                                          Prior Functioning/Environment              Frequency Min 2X/week     Progress Toward Goals  OT Goals(current goals can now be found in the care plan section)     Acute Rehab OT Goals Patient Stated Goal: Go home OT Goal Formulation: With patient Time For Goal Achievement: 02/27/14 Potential to Achieve Goals: Good  Plan Discharge plan remains appropriate    End of Session    Activity  Tolerance Patient tolerated treatment well   Patient Left in chair;with call bell/phone within reach   Nurse Communication Mobility status         Betsy Pries 02/22/2014, 12:56 PM

## 2014-02-22 NOTE — Progress Notes (Signed)
NUTRITION FOLLOW UP/CONSULT FOR TF RECOMMENDATIONS  Intervention:   - Continue to monitor TF as ordered by MD, recommend Jevity 1.5 with goal of 58ml/hr, increasing to goal rate per MD  - Goal rate for TF at home will be Jevity 1.5 at 36ml/hr run continuously for 24 hours. Will need 231ml water flushes 4 times/day. Goal rate will provide 2160 calories, 92g protein, 1037ml free water and meet 100% of estimated nutritional needs.  - Will continue to monitor   Nutrition Dx:   Inadequate oral intake related to inability to eat as evidenced by NPO - resolved, pt eating 100% of full liquid meals.   New nutrition dx: Altered GI function related to gastric CA as evidenced by MD notes, J tube for additional nutrition.    Goal:   TF to meet >90% of estimated nutritional needs - not met   Monitor:   Weights, labs, TF advancement, nausea/vomiting, intake  Assessment:   Pt with gastric CA s/p neoadjuvant chemoradiation. Had diagnostic laparoscopy, distal gastrectomy with billroth 2 anastamosis, feeding jejunostomy 02/15/14.   3/27 - Pt with pt who reports since starting chemoradiation in December, he had problems swallowing and c/o significant weakness in addition to 10 pound unintended weight loss. States there was a time when he could only swallow liquids and he lived off Ensure. Since he finished treatment recently, his appetite and dysphagia have improved in addition to his energy level. Had a steak and baked potato earlier this week.   3/30 - Noted pt started having nausea/vomiting yesterday early morning with emesis bilious in color and TF was held. Vomited 33ml of brown liquid last night and NGT placed with 1.2L brown liquid immediately suctioned out yesterday. Pt asleep during RD visit today, did not disturb. Pt with 261ml green output from NGT so far today. Noted plans to increase TF by 47ml every 6 hours to goal of 63ml/hr of Jevity 1.5 via J tube per surgeon's notes.   4/1 - NGT d/c  yesterday. No nausea after NGT removed. Only c/o is acid reflux which started today. Diet advanced to full liquids this morning and pt ate all of his tray except for milk.    Current TF: Jevity 1.5 at 77ml/hr via J tube - provides 1080 calories, 46g protein, and 589ml free water which meets 54% estimated calorie needs, 51% estimated protein needs   Height: Ht Readings from Last 1 Encounters:  02/15/14 $RemoveB'5\' 10"'IocjswAM$  (1.778 m)    Weight Status:   Wt Readings from Last 1 Encounters:  02/21/14 161 lb 9.6 oz (73.3 kg)  Admit wt:        170 lb (77.1 kg)  Re-estimated needs:  Kcal: 2000-2300  Protein: 90-115g  Fluid: 2-2.3L/day    Skin: Abdominal incision  Diet Order: Full Liquid   Intake/Output Summary (Last 24 hours) at 02/22/14 1524 Last data filed at 02/22/14 1400  Gross per 24 hour  Intake   1670 ml  Output    900 ml  Net    770 ml    Last BM: 3/31   Labs:   Recent Labs Lab 02/18/14 0517 02/19/14 0446 02/20/14 0432 02/22/14 0415  NA 134* 135* 137  --   K 3.7 4.1 3.6*  --   CL 99 101 102  --   CO2 $Re'27 25 26  'RcT$ --   BUN $Re'10 15 21  'AVg$ --   CREATININE 0.71 0.70 0.77 0.76  CALCIUM 8.6 9.1 8.6  --   GLUCOSE 117* 139* 117*  --  CBG (last 3)   Recent Labs  02/21/14 2333 02/22/14 0744 02/22/14 1214  GLUCAP 98 136* 100*    Scheduled Meds: . bisacodyl  10 mg Rectal Daily  . chlorhexidine  15 mL Mouth/Throat BID  . enoxaparin (LOVENOX) injection  40 mg Subcutaneous Q24H  . famotidine (PEPCID) IV  20 mg Intravenous Q12H  . levothyroxine  50 mcg Oral q morning - 10a  . lip balm  1 application Topical BID  . pantoprazole sodium  40 mg Per Tube Daily    Continuous Infusions: . dextrose 5 % and 0.45 % NaCl with KCl 20 mEq/L 50 mL/hr at 02/21/14 1856  . feeding supplement (JEVITY 1.5 CAL/FIBER) 1,000 mL (02/22/14 1134)     [place], RD, [place] Pager [phone] After Hours Pager

## 2014-02-22 NOTE — Progress Notes (Signed)
Called by pt to room- tube feeding had separated from jejunostomy. Unable to flush j tube. Attempted to instill coke into tube. Unable to flush. Dr Hassell Done notified orders noted

## 2014-02-23 LAB — GLUCOSE, CAPILLARY
GLUCOSE-CAPILLARY: 98 mg/dL (ref 70–99)
GLUCOSE-CAPILLARY: 104 mg/dL — AB (ref 70–99)
Glucose-Capillary: 100 mg/dL — ABNORMAL HIGH (ref 70–99)
Glucose-Capillary: 114 mg/dL — ABNORMAL HIGH (ref 70–99)
Glucose-Capillary: 96 mg/dL (ref 70–99)

## 2014-02-23 NOTE — Progress Notes (Signed)
[facility]   [facility] is providing the following services: Tube Feed Pump/Formula/Supplies  If patient discharges after hours, please call [phone].   Linward Headland 02/23/2014, 11:28 AM

## 2014-02-23 NOTE — Progress Notes (Signed)
Patient ID: Kyle Armstrong, male   DOB: [birth year], 80 y.o.   MRN: [id number] [facility] Surgery Progress Note:   8 Days Post-Op  Subjective: Mental status is clear.  Had some GER Objective: Vital signs in last 24 hours: Temp:  [98.2 F (36.8 C)-98.6 F (37 C)] 98.3 F (36.8 C) (04/02 0529) Pulse Rate:  [84-92] 84 (04/02 0529) Resp:  [18] 18 (04/02 0529) BP: (128-139)/(72-87) 132/77 mmHg (04/02 0529) SpO2:  [97 %-98 %] 97 % (04/02 0529) Weight:  [173 lb 8 oz (78.7 kg)] 173 lb 8 oz (78.7 kg) (04/02 0500)  Intake/Output from previous day: 04/01 0701 - 04/02 0700 In: 1514.2 [P.O.:240; I.V.:1224.2; IV Piggyback:50] Out: 1625 [Urine:1625] Intake/Output this shift:    Physical Exam: Work of breathing is not labored.  Incision OK.  Will remove staples  Lab Results:  Results for orders placed during the hospital encounter of 02/15/14 (from the past 48 hour(s))  GLUCOSE, CAPILLARY     Status: Abnormal   Collection Time    02/21/14 12:23 PM      Result Value Ref Range   Glucose-Capillary 115 (*) 70 - 99 mg/dL   Comment 1 Documented in Chart     Comment 2 Notify RN    GLUCOSE, CAPILLARY     Status: Abnormal   Collection Time    02/21/14  4:00 PM      Result Value Ref Range   Glucose-Capillary 125 (*) 70 - 99 mg/dL  GLUCOSE, CAPILLARY     Status: Abnormal   Collection Time    02/21/14  7:27 PM      Result Value Ref Range   Glucose-Capillary 111 (*) 70 - 99 mg/dL  GLUCOSE, CAPILLARY     Status: None   Collection Time    02/21/14 11:33 PM      Result Value Ref Range   Glucose-Capillary 98  70 - 99 mg/dL  CREATININE, SERUM     Status: Abnormal   Collection Time    02/22/14  4:15 AM      Result Value Ref Range   Creatinine, Ser 0.76  0.50 - 1.35 mg/dL   GFR calc non Af Amer 83 (*) >90 mL/min   GFR calc Af Amer >90  >90 mL/min   Comment: (NOTE)     The eGFR has been calculated using the CKD EPI equation.     This calculation has not been validated in all clinical  situations.     eGFR's persistently <90 mL/min signify possible Chronic Kidney     Disease.  GLUCOSE, CAPILLARY     Status: Abnormal   Collection Time    02/22/14  7:44 AM      Result Value Ref Range   Glucose-Capillary 136 (*) 70 - 99 mg/dL  GLUCOSE, CAPILLARY     Status: Abnormal   Collection Time    02/22/14 12:14 PM      Result Value Ref Range   Glucose-Capillary 100 (*) 70 - 99 mg/dL  GLUCOSE, CAPILLARY     Status: Abnormal   Collection Time    02/22/14  4:29 PM      Result Value Ref Range   Glucose-Capillary 106 (*) 70 - 99 mg/dL  GLUCOSE, CAPILLARY     Status: Abnormal   Collection Time    02/22/14  7:30 PM      Result Value Ref Range   Glucose-Capillary 141 (*) 70 - 99 mg/dL  GLUCOSE, CAPILLARY     Status: None  Collection Time    02/22/14 11:34 PM      Result Value Ref Range   Glucose-Capillary 86  70 - 99 mg/dL  GLUCOSE, CAPILLARY     Status: None   Collection Time    02/23/14  3:33 AM      Result Value Ref Range   Glucose-Capillary 98  70 - 99 mg/dL    Radiology/Results: No results found.  Anti-infectives: Anti-infectives   Start     Dose/Rate Route Frequency Ordered Stop   02/15/14 1800  ciprofloxacin (CIPRO) IVPB 400 mg     400 mg 200 mL/hr over 60 Minutes Intravenous Every 12 hours 02/15/14 1402 02/15/14 1934   02/15/14 1600  metroNIDAZOLE (FLAGYL) IVPB 500 mg     500 mg 100 mL/hr over 60 Minutes Intravenous Every 8 hours 02/15/14 1402 02/16/14 0108   02/15/14 0724  metroNIDAZOLE (FLAGYL) IVPB 500 mg     500 mg 100 mL/hr over 60 Minutes Intravenous On call to O.R. 02/15/14 0724 02/15/14 0930   02/15/14 0724  ciprofloxacin (CIPRO) IVPB 400 mg     400 mg 200 mL/hr over 60 Minutes Intravenous On call to O.R. 02/15/14 0724 02/15/14 1020      Assessment/Plan: Problem List: Patient Active Problem List   Diagnosis Date Noted  . HOH (hard of hearing) 02/18/2014  . Mild malnutrition 02/18/2014  . Jejunostomy tube present 02/17/2014  . Cancer of  antrum of stomach s/p distal gastrectomy/B2/feeding jejunostomy 02/15/2014 11/02/2013  . Hyperlipidemia   . Pernicious anemia   . Hypothyroidism   . Arthritis   . SVT (supraventricular tachycardia) 06/23/2012    Advance diet today.  J tube clogged.  If tolerates diet will discharge tomorrow.  Staples out today 8 Days Post-Op    LOS: 8 days   Matt B. Hassell Done, MD, [facility] [phone] beeper [phone]  02/23/2014 8:09 AM

## 2014-02-23 NOTE — Progress Notes (Signed)
Physical Therapy Treatment Patient Details Name: JAMERSON VONBARGEN MRN: [id number] DOB: 1930/01/05 Today's Date: 02/23/2014    History of Present Illness Pt with gastric CA and s/p LAPAROSCOPIC diagnositc, distal gastrostomy  feeding jejunostomy     PT Comments    Assisted pt OOB to amb around unit twice.  Pt c/o reflux and belched several times during session.  Pt c/o inability to eat much.  Assisted pt back to bed.  Progressing slowly.  Follow Up Recommendations  Home health PT     Equipment Recommendations  Rolling walker with 5" wheels;3in1 (PT)    Recommendations for Other Services       Precautions / Restrictions Precautions Precautions: Fall Restrictions Weight Bearing Restrictions: No    Mobility  Bed Mobility Overal bed mobility: Needs Assistance Bed Mobility: Supine to Sit;Sit to Supine     Supine to sit: Min guard Sit to supine: Min guard;Min assist   General bed mobility comments: increased time  Transfers Overall transfer level: Needs assistance Equipment used: Rolling walker (2 wheeled) Transfers: Sit to/from Stand Sit to Stand: Supervision;Min guard         General transfer comment: increased time and one VC turn completion prior to sit  Ambulation/Gait Ambulation/Gait assistance: Supervision;Min guard Ambulation Distance (Feet): 975 Feet Assistive device: Rolling walker (2 wheeled) Gait Pattern/deviations: Step-through pattern;Trunk flexed Gait velocity: decreased   General Gait Details: <25% VC's for proper walker to self distance and upright posture.   Stairs            Wheelchair Mobility    Modified Rankin (Stroke Patients Only)       Balance                                    Cognition                            Exercises      General Comments        Pertinent Vitals/Pain     Home Living                      Prior Function            PT Goals (current goals can now be  found in the care plan section) Progress towards PT goals: Progressing toward goals    Frequency  Min 3X/week    PT Plan      Co-evaluation             End of Session Equipment Utilized During Treatment: Gait belt Activity Tolerance: Patient tolerated treatment well Patient left: in bed;with call bell/phone within reach     Time: 1434-1500 PT Time Calculation (min): 26 min  Charges:  $Gait Training: 8-22 mins $Therapeutic Activity: 8-22 mins                    G Codes:      Rica Koyanagi  PTA [facility]  Acute  Rehab Pager      [phone]

## 2014-02-24 LAB — GLUCOSE, CAPILLARY
GLUCOSE-CAPILLARY: 127 mg/dL — AB (ref 70–99)
Glucose-Capillary: 103 mg/dL — ABNORMAL HIGH (ref 70–99)
Glucose-Capillary: 116 mg/dL — ABNORMAL HIGH (ref 70–99)

## 2014-02-24 LAB — BASIC METABOLIC PANEL
BUN: 8 mg/dL (ref 6–23)
CO2: 23 meq/L (ref 19–32)
Calcium: 8.3 mg/dL — ABNORMAL LOW (ref 8.4–10.5)
Chloride: 96 mEq/L (ref 96–112)
Creatinine, Ser: 0.64 mg/dL (ref 0.50–1.35)
GFR calc Af Amer: >90 mL/min (ref >90)
GFR, EST NON AFRICAN AMERICAN: 89 mL/min — AB (ref >90)
GLUCOSE: 105 mg/dL — AB (ref 70–99)
POTASSIUM: 4.3 meq/L (ref 3.7–5.3)
Sodium: 130 mEq/L — ABNORMAL LOW (ref 137–147)

## 2014-02-24 LAB — CBC
HEMATOCRIT: 27.4 % — AB (ref 39.0–52.0)
HEMOGLOBIN: 8.9 g/dL — AB (ref 13.0–17.0)
MCH: 29.6 pg (ref 26.0–34.0)
MCHC: 32.5 g/dL (ref 30.0–36.0)
MCV: 91 fL (ref 78.0–100.0)
Platelets: 174 10*3/uL (ref 150–400)
RBC: 3.01 MIL/uL — AB (ref 4.22–5.81)
RDW: 13.7 % (ref 11.5–15.5)
WBC: 5.3 10*3/uL (ref 4.0–10.5)

## 2014-02-24 MED ORDER — HYDROCODONE-ACETAMINOPHEN 7.5-325 MG/15ML PO SOLN: 15.0000 mL | ORAL | Status: DC | PRN

## 2014-02-24 NOTE — Progress Notes (Signed)
Occupational Therapy Treatment Patient Details Name: Kyle Armstrong MRN: [id number] DOB: [birth year] Today's Date: 02/24/2014    History of present illness Pt with gastric CA and s/p LAPAROSCOPIC diagnositc, distal gastrostomy  feeding jejunostomy    OT comments  Pt motivated and initiated rest breaks during adls. He very much wants to be as independent as possible.    Follow Up Recommendations  No OT follow up;Supervision - Intermittent    Equipment Recommendations  None recommended by OT    Recommendations for Other Services      Precautions / Restrictions Precautions Precautions: Fall       Mobility Bed Mobility         Supine to sit: Supervision Sit to supine: Supervision   General bed mobility comments: increased time; used bedrail  Transfers     Transfers: Sit to/from Stand Sit to Stand: Supervision              Balance                                   ADL                       Lower Body Dressing: Supervision/safety;Sit to/from stand Lower Body Dressing Details (indicate cue type and reason): extra time and rest breaks.  Donned underwear, pants, socks and shoes               General ADL Comments: pt plans home today.  Fatiques easily and wanted to get dressed as he plans d/c today. Pt has more difficulty with LLE--suggested putting this legs in first.  Pt took multiple rest breaks, and OT encouraged him to do so for energy conservation.  He has no concerns at home.  Pt still experiencing burning with swallowing and this limits his food intake.  Reviewed energy conservation      Vision                     Perception     Praxis      Cognition   Behavior During Therapy: WFL for tasks assessed/performed Overall Cognitive Status: Within Functional Limits for tasks assessed                       Extremity/Trunk Assessment               Exercises     Shoulder Instructions       General  Comments      Pertinent Vitals/ Pain       C/o burning in throat/upper chest--not rated.  Home Living                                          Prior Functioning/Environment              Frequency Min 2X/week     Progress Toward Goals  OT Goals(current goals can now be found in the care plan section)  Progress towards OT goals: Progressing toward goals     Plan      Co-evaluation                 End of Session     Activity Tolerance     Patient Left in bed;with call bell/phone within  reach   Nurse Communication          Time: 1587-2761 OT Time Calculation (min): 24 min  Charges: OT General Charges $OT Visit: 1 Procedure OT Treatments $Self Care/Home Management : 23-37 mins  Shooter Tangen 02/24/2014, 11:38 AM Lesle Chris, OTR/L [phone] 02/24/2014

## 2014-02-24 NOTE — Discharge Instructions (Signed)
Gastric Cancer  Gastric cancer is a tumor which starts as a growth in your stomach. Cancer is a group of many related diseases that begin in cells, the building blocks of the body. Normally, cells grow and divide to produce more cells only when the body needs them. Sometimes, cells keep dividing when new cells are not needed. These extra cells may form a mass of tissue called a growth or tumor. Tumors can be either benign (not cancerous) or malignant (cancerous). Cancer can begin in any organ or tissue of the body. The original tumor (where the tumor started out) is called the primary cancer and is usually named for where it begins.  Several types of cancer can occur in the stomach. Adenocarcinoma is the most common, accounting for about 95% of gastric tumors. Other cancer types include carcinoid tumors, lymphoma or gastrointestinal stromal cell tumors (GISTs).  CAUSES  Though the exact cause of gastric cancer is not known, there are several known risk factors:  Age over 24.  Male sex.  Race: more common in [place], [place], Hispanic and African American people.  Diet high in smoked, salted or pickled foods.  Tobacco and alcohol use.  History of stomach surgery, chronic gastritis, gastric polyps or pernicious anemia.  Stomach infection with H. pylori bacteria (which also increases risk for ulcers).  Genetic factors including family history and blood type A. Note that very few people with risk factors actually develop gastric cancer. SYMPTOMS   Pain.  Loss of appetite.  Problems swallowing.  Nausea and vomiting.  Vomiting blood.  Abdominal pain.  Excessive gas or belching.  Weight loss.  General health problems. DIAGNOSIS  Your caregiver may suspect gastric cancer based on your symptoms and your physical exam. Further testing can diagnose gastric cancer. This may include looking for blood in your stool. Gastroscopy (looking at your stomach through an instrument like a  thin flexible telescope; also called endoscopy) may also be done. Biopsies can be done if an abnormal growth is found. This is the removal of a small piece of tissue from your stomach if your caregiver notices abnormalities or growths there. The biopsy is looked at under a microscope by a specialist who can tell if cancer is present. If cancer is confirmed, other tests may be needed to see if the cancer has spread beyond the stomach. TREATMENT   Surgical removal of the stomach (gastrectomy) is the only curative treatment. Sometimes only part of the stomach needs to be removed, depending on location of the cancer. Gastrectomy can be done if the cancer is found before it has spread beyond the stomach.  Radiation therapy and chemotherapy may be helpful. Chemotherapy and radiation therapy given after surgery may improve cure rates or make you feel better.  Antibiotics are sometimes used for H.pylori infection.  Advanced techniques to remove or destroy cancer without surgery are being researched.  Feeding tubes or bypass surgery may help if food becomes blocked. The possibility of curing your gastric cancer depends on the type of tumor, the location of the tumor, and whether or not the tumor has spread beyond the stomach. If your cancer cannot be cured, treatment may slow the progression of the disease. Treatments are also available to address pain or other symptoms of cancer. HOME CARE INSTRUCTIONS   Your caregiver may prescribe a specific type of diet. If you have had surgery, then a dietician may help you with an eating plan. Avoid red meats, processed meats, and salty, smoked or pickled  foods.  Take any prescribed medications as directed. Do not use more pain medication than directed.  You do not need to limit your activity unless instructed by your caregiver.  Avoid alcohol and tobacco use.  Keep appointments for tests and with your caregiver or specialists. SEEK MEDICAL CARE IF:   You have  problems eating.  You have problems tolerating your medications.  You continue to lose weight despite your treatments. SEEK IMMEDIATE MEDICAL CARE IF:   You have uncontrolled nausea, vomiting or diarrhea.  You have vomiting with blood or coffee-grounds type material.  You have had chemotherapy and you have a fever.  You have uncontrolled pain. Document Released: 08/14/2004 Document Revised: 02/02/2012 Document Reviewed: 11/21/2008 [facility] Patient Information 2014 [place].

## 2014-02-24 NOTE — Progress Notes (Signed)
   CARE MANAGEMENT NOTE 02/24/2014  Patient:  Kyle Armstrong, Kyle Armstrong   Account Number:  [id number]  Date Initiated:  02/16/2014  Documentation initiated by:  Sunday Spillers  Subjective/Objective Assessment:   80 yo male admitted s/p lap gastrectomy. PTA lived at home with family.     Action/Plan:   Home when stable. Needs HHPT/RN.   Anticipated DC Date:  02/24/2014   Anticipated DC Plan:  [place]  CM consult      Choice offered to / List presented to:  C-1 Patient   DME arranged  TUBE FEEDING  TUBE FEEDING PUMP      DME agency  [place] arranged  HH-1 RN  [facility].   Status of service:  Completed, signed off Medicare Important Message given?  NA - LOS <3 / Initial given by admissions (If response is "NO", the following Medicare IM given date fields will be blank) Date Medicare IM given:   Date Additional Medicare IM given:    Discharge Disposition:  HOME/SELF CARE  Per UR Regulation:  Reviewed for med. necessity/level of care/duration of stay  If discussed at [place] of Stay Meetings, dates discussed:    Comments:  [id number]/pt discharge to return home/ j-tube remains blocked/tube is not to be be used for feeds/is to remain capped/ pt is taking po nutrition at time of discharge and without emesis/Deloma Spindle,RN,BSN,CCM   02/22/14 Allene Dillon RN BSN [phone] Pt will be needing HHPT/RN at d/c. He is going home on tube feeds. He stated he lives with his son and his daughter assists with his care too. He has chosen [facility] to provide the home health services. He will also need a feeding pump and tube feeds to be ordered. I have asked the bedside RN Pattie to obtain these orders from the md. I have spoken to [place] about the needs.

## 2014-02-24 NOTE — Discharge Summary (Signed)
Physician Discharge Summary  Patient ID: Kyle Armstrong MRN: [id number] DOB/AGE: 27-Dec-1929 80 y.o.  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  Failed early feeding.  Began on clears and jejunostomy clogged.  Diet advanced and ready to discharge.    Consults: none  Significant Diagnostic Studies: none known    Discharge Exam: Blood pressure 114/78, pulse 84, temperature 98.7 F (37.1 C), temperature source Oral, resp. rate 18, height 5\' 10"  (1.778 m), weight 174 lb 9.6 oz (79.198 kg), SpO2 96.00%. Incisions ok.  Jejunostomy in place but occluded.  Disposition: 01-Home or Self Care  Discharge Orders   Future Appointments Provider Department Dept Phone   03/09/2014 10:45 AM Owens Shark, NP [facility] [phone]   Future Orders Complete By Expires   Diet - low sodium heart healthy  As directed    Discharge instructions  As directed    Comments:     Leave jejunostomy tube capped.  You may shower and get this wet; pat dry and apply Neopsporin around the entrance site.   Discharge wound care:  As directed    Comments:     May shower; leave steri strips in place until they come off   Increase activity slowly  As directed        Medication List         acetaminophen 500 MG tablet  Commonly known as:  TYLENOL  Take 1,000 mg by mouth every 6 (six) hours as needed for mild pain or moderate pain.     HYDROcodone-acetaminophen 7.5-325 mg/15 ml solution  Commonly known as:  HYCET  Take  15 mLs by mouth every 4 (four) hours as needed for moderate pain.     levothyroxine 50 MCG tablet  Commonly known as:  SYNTHROID, LEVOTHROID  Take 50 mcg by mouth every morning.     pantoprazole 40 MG tablet  Commonly known as:  PROTONIX  Take 40 mg by mouth 2 (two) times daily as needed (for acid reflux).     ranitidine 150 MG capsule  Commonly known as:  ZANTAC  Take 150 mg by mouth as needed for heartburn.     simvastatin 40 MG tablet  Commonly known as:  ZOCOR  Take 40 mg by mouth every evening.     VITAMIN B-12 IJ  Inject as directed every 30 (thirty) days.           Follow-up Information   Follow up with [facility], MD.   Specialty:  General Surgery   Contact information:   [address] [place] [address] [phone]       Signed: Pedro Earls 02/24/2014, 10:27 AM

## 2014-02-27 ENCOUNTER — Other Ambulatory Visit (INDEPENDENT_AMBULATORY_CARE_PROVIDER_SITE_OTHER): Payer: Self-pay

## 2014-02-27 ENCOUNTER — Ambulatory Visit (HOSPITAL_COMMUNITY)
Admission: RE | Admit: 2014-02-27 | Discharge: 2014-02-27 | Disposition: A | Discharge status: Home / Self Care | Payer: Medicare Other | Source: Ambulatory Visit | Attending: General Surgery | Admitting: General Surgery

## 2014-02-27 ENCOUNTER — Other Ambulatory Visit (INDEPENDENT_AMBULATORY_CARE_PROVIDER_SITE_OTHER): Payer: Self-pay | Admitting: General Surgery

## 2014-02-27 ENCOUNTER — Telehealth (INDEPENDENT_AMBULATORY_CARE_PROVIDER_SITE_OTHER): Payer: Self-pay

## 2014-02-27 DIAGNOSIS — R1319 Other dysphagia: Secondary | ICD-10-CM | POA: Insufficient documentation

## 2014-02-27 DIAGNOSIS — Z934 Other artificial openings of gastrointestinal tract status: Secondary | ICD-10-CM

## 2014-02-27 DIAGNOSIS — IMO0002 Reserved for concepts with insufficient information to code with codable children: Secondary | ICD-10-CM | POA: Insufficient documentation

## 2014-02-27 DIAGNOSIS — Y833 Surgical operation with formation of external stoma as the cause of abnormal reaction of the patient, or of later complication, without mention of misadventure at the time of the procedure: Secondary | ICD-10-CM | POA: Insufficient documentation

## 2014-02-27 DIAGNOSIS — Z85028 Personal history of other malignant neoplasm of stomach: Secondary | ICD-10-CM | POA: Insufficient documentation

## 2014-02-27 NOTE — Telephone Encounter (Signed)
Patient called this am stating his J-Tube was clogged and had not functioned since he was discharged form the hospital,and  he can not eat . Patient states he has not been contacted from Home health for J-Tube feeding. I Informed DR. Byerly she ordered J- tube replacement, and to follow up on Home health.  order was placed and spoke to IR @[facility]  and was informed the J-Tube must be in place for 2 weeks before it can be replaced but he would be seen today @1p  to evaluate  and determine what needs to be done. [facility] was called spoke to [facility] Rn who stated they had a E-Mail stating patient was eating and Home Health was not needed at this time . I advised per patient he was not eating and Home health had not contacted him. Mrs Parthenia Ames will call him and call us back

## 2014-02-27 NOTE — Telephone Encounter (Signed)
Dr. Barry Dienes ordered Jevity 1.2  60/ml Q12 hrs @ [facility] flush 240 cc  before and after feeding Informed K. ZNBVAPOLI RN /ADV , she states she is going to call IR at [facility] to get written ordered because it can not a verbal due to the discharge summary stating for J. Tube to be capped.

## 2014-02-27 NOTE — Telephone Encounter (Signed)
Ginny Forth called to inform us R. Rosana Hoes Rn discharge summary states patient J-Tube was capped and was not to be used patient was eating good. I advised her I would inform DR.Byerly  and call with any changes # [phone]

## 2014-02-27 NOTE — Procedures (Signed)
Technically successful fluoroscopic guided unclogging of existing surgically placed J-tube.  No exchange performed. No complications. Feeding tube is ready for immediate use.

## 2014-02-27 NOTE — Telephone Encounter (Signed)
Faxed orders for tube feedings to [facility].

## 2014-02-28 ENCOUNTER — Ambulatory Visit (INDEPENDENT_AMBULATORY_CARE_PROVIDER_SITE_OTHER): Payer: Medicare Other | Admitting: General Surgery

## 2014-02-28 ENCOUNTER — Encounter (INDEPENDENT_AMBULATORY_CARE_PROVIDER_SITE_OTHER): Payer: Self-pay | Admitting: General Surgery

## 2014-02-28 VITALS — BP 90/50 | HR 102 | Resp 20 | Ht 70.0 in | Wt 158.6 lb

## 2014-02-28 DIAGNOSIS — C163 Malignant neoplasm of pyloric antrum: Secondary | ICD-10-CM

## 2014-02-28 DIAGNOSIS — E43 Unspecified severe protein-calorie malnutrition: Secondary | ICD-10-CM

## 2014-02-28 DIAGNOSIS — K209 Esophagitis, unspecified without bleeding: Secondary | ICD-10-CM

## 2014-02-28 DIAGNOSIS — R197 Diarrhea, unspecified: Secondary | ICD-10-CM | POA: Insufficient documentation

## 2014-02-28 MED ORDER — NYSTATIN 100000 UNIT/ML MT SUSP: 5.0000 mL | Freq: Four times a day (QID) | OROMUCOSAL | Status: DC

## 2014-02-28 MED ORDER — MAGIC MOUTHWASH W/LIDOCAINE: 5.0000 mL | Freq: Three times a day (TID) | ORAL | Status: DC | PRN

## 2014-02-28 MED ORDER — DIPHENOXYLATE-ATROPINE 2.5-0.025 MG/5ML PO LIQD: 5.0000 mL | Freq: Three times a day (TID) | ORAL | Status: DC | PRN

## 2014-02-28 NOTE — Assessment & Plan Note (Signed)
Lomotil PRN.

## 2014-02-28 NOTE — Patient Instructions (Signed)
Start tube feeds.  Do 3 more boosts tonight with the water flush.  [facility] tomorrow.    Follow up next Friday.

## 2014-02-28 NOTE — Assessment & Plan Note (Signed)
Start tube feeds ASAP.  Will do syringe feeds today, then [facility] will come tomorrow.    Will also recheck labs tomorrow.

## 2014-02-28 NOTE — Progress Notes (Signed)
HISTORY: Pt is around 2 weeks s/p distal gastrectomy/j tube for gastric cancer.  He was discharged 4 days ago, but has been unable to eat due to severe esophagitis/throat pain.  He is not having fevers/ chills.  He had to go to IR yesterday to get J tube unclogged.      EXAM: General:  Alert and oriented.   Incision:  Healing well.     PATHOLOGY: Diagnosis 1. Stomach, resection for tumor, distal gastrectomy - RESIDUAL INVASIVE ADENOCARCINOMA, INTESTINAL TYPE, INVADING INTO THE SUBMUCOSA. - BACKGROUND EXTENSIVE CHRONIC ATROPHIC GASTRITIS WITH INTESTINAL METAPLASIA. - NINE LYMPH NODES, NEGATIVE FOR METASTATIC CARCINOMA (0/9). - RESECTION MARGINS NEGATIVE FOR MALIGNANCY. 2. Lymph nodes, regional resection, gastro hepatic - TWO LYMPH NODES, NEGATIVE FOR METASTATIC CARCINOMA (0/2).   ASSESSMENT AND PLAN:   Severe protein-calorie malnutrition Start tube feeds ASAP.  Will do syringe feeds today, then [facility] will come tomorrow.    Will also recheck labs tomorrow.    Diarrhea Lomotil PRN.  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  No wound infection or other issues other than the esophagitis.    Acute esophagitis Magic mouthwash.  Nystatin swish and swallow.        Kyle Height, MD Surgical Oncology, [facility]   Kyle Crutch, MD Kyle Crutch, MD

## 2014-02-28 NOTE — Assessment & Plan Note (Signed)
Unable to really assess gastric emptying since esophagitis has been inhibiting his swallowing.  No wound infection or other issues other than the esophagitis.

## 2014-02-28 NOTE — Assessment & Plan Note (Signed)
Magic mouthwash.  Nystatin swish and swallow.

## 2014-03-01 ENCOUNTER — Telehealth (INDEPENDENT_AMBULATORY_CARE_PROVIDER_SITE_OTHER): Payer: Self-pay

## 2014-03-01 ENCOUNTER — Ambulatory Visit: Admission: RE | Admit: 2014-03-01 | Payer: Medicare Other | Source: Ambulatory Visit | Admitting: Radiation Oncology

## 2014-03-01 NOTE — Telephone Encounter (Signed)
Kyle Armstrong /[facility] ask for DR. Byerly ov note 02-28-14, which was faxed to [phone]. Mrs Kyle Armstrong states they will be going out today to start his G-Tube feeding.

## 2014-03-07 ENCOUNTER — Telehealth (INDEPENDENT_AMBULATORY_CARE_PROVIDER_SITE_OTHER): Payer: Self-pay

## 2014-03-07 ENCOUNTER — Telehealth: Payer: Self-pay | Admitting: *Deleted

## 2014-03-07 NOTE — Telephone Encounter (Signed)
Pt's daughter calling about pt b/c very concerned that pt is getting worse not better. The pt is getting very weak and having trouble walking from room to room in his house without being extremely tired. The pt is doing the tube feeds but doesn't feel that he is getting any nutrition from the feeds. The pt has tried to eat by mouth but says everything turns to acid that he puts in his mouth. The pt can't even drink water without saying that it hurts so bad. I asked about the pt trying Boost or Ensures but the daughter said he just can't b/c he constantly is saying that he has a sour stomach. The pt is scheduled to see Dr Barry Dienes this Friday but the daughter said the pt is going to be calling in to see if Dr Barry Dienes could do anything else for him in the office b/c he doesn't want to go to the hospital. The daughter told me that Dr Barry Dienes did mention that if the pt was not improving that she was going to admit the pt. The daughter was just calling to give [place] a heads up on the pt before he calls into the office. I advised that Dr Barry Dienes is not in the office today that she is in surgery all day so once she has time to review her messages that her assistant will get back in touch with her. She understands.

## 2014-03-07 NOTE — Telephone Encounter (Signed)
Please get CMET, CBC, prealbumin.  If they are doing tube feeds, it may be that his esophagitis is still horrible.  I will see if GI is willing to scope him yet.

## 2014-03-07 NOTE — Telephone Encounter (Signed)
Pt's daughter Margarita Grizzle called to notified office "he just doesn't feel like coming in for appt Thursday"  Daughter states pt wants to re-schedule appt for 2 weeks.  "he feels just really weak"  She reports he is taking his tube feeding each day and will f/u with Dr. Barry Dienes on Friday.  Daughter instructed to call office if pt needs anything and office will re-schedule appt.  Pt's daughter verbalized understanding.

## 2014-03-08 ENCOUNTER — Other Ambulatory Visit (INDEPENDENT_AMBULATORY_CARE_PROVIDER_SITE_OTHER): Payer: Self-pay | Admitting: General Surgery

## 2014-03-08 DIAGNOSIS — C169 Malignant neoplasm of stomach, unspecified: Secondary | ICD-10-CM

## 2014-03-08 NOTE — Telephone Encounter (Signed)
Pt is feeling a little better this morning.  He states the soreness and burning in his throat comes and goes.  He is feeling weak, and cancelled his appt with Dr. Lisbeth Renshaw on 03/01/14.  He agreed to have labs drawn early tomorrow.  Will order them STAT so results will be available when he comes to see Dr. Barry Dienes on 03/10/14.

## 2014-03-09 ENCOUNTER — Ambulatory Visit: Payer: Medicare Other | Admitting: Nurse Practitioner

## 2014-03-09 ENCOUNTER — Telehealth: Payer: Self-pay | Admitting: Oncology

## 2014-03-09 LAB — COMPREHENSIVE METABOLIC PANEL
ALBUMIN: 3 g/dL — AB (ref 3.5–5.2)
ALT: 22 U/L (ref 0–53)
AST: 31 U/L (ref 0–37)
Alkaline Phosphatase: 84 U/L (ref 39–117)
BUN: 15 mg/dL (ref 6–23)
CALCIUM: 8.8 mg/dL (ref 8.4–10.5)
CHLORIDE: 102 meq/L (ref 96–112)
CO2: 28 meq/L (ref 19–32)
Creat: 0.8 mg/dL (ref 0.50–1.35)
Glucose, Bld: 110 mg/dL — ABNORMAL HIGH (ref 70–99)
POTASSIUM: 3.8 meq/L (ref 3.5–5.3)
Sodium: 138 mEq/L (ref 135–145)
Total Bilirubin: 0.5 mg/dL (ref 0.2–1.2)
Total Protein: 6.3 g/dL (ref 6.0–8.3)

## 2014-03-09 LAB — CBC
HCT: 32.3 % — ABNORMAL LOW (ref 39.0–52.0)
Hemoglobin: 10.3 g/dL — ABNORMAL LOW (ref 13.0–17.0)
MCH: 28.5 pg (ref 26.0–34.0)
MCHC: 31.9 g/dL (ref 30.0–36.0)
MCV: 89.2 fL (ref 78.0–100.0)
Platelets: 258 10*3/uL (ref 150–400)
RBC: 3.62 MIL/uL — ABNORMAL LOW (ref 4.22–5.81)
RDW: 13.8 % (ref 11.5–15.5)
WBC: 5.6 10*3/uL (ref 4.0–10.5)

## 2014-03-09 NOTE — Progress Notes (Signed)
Quick Note:  Please let pt know that labs look OK. ______

## 2014-03-09 NOTE — Telephone Encounter (Signed)
s.w. pt and advised on 4.29.15 appt .Marland Kitchen..pt ok and aware

## 2014-03-10 ENCOUNTER — Other Ambulatory Visit (INDEPENDENT_AMBULATORY_CARE_PROVIDER_SITE_OTHER): Payer: Self-pay

## 2014-03-10 ENCOUNTER — Encounter (INDEPENDENT_AMBULATORY_CARE_PROVIDER_SITE_OTHER): Payer: Self-pay | Admitting: General Surgery

## 2014-03-10 ENCOUNTER — Ambulatory Visit (INDEPENDENT_AMBULATORY_CARE_PROVIDER_SITE_OTHER): Payer: Medicare Other | Admitting: General Surgery

## 2014-03-10 VITALS — BP 105/60 | HR 81 | Temp 98.2°F | Resp 16 | Ht 70.0 in | Wt 156.2 lb

## 2014-03-10 DIAGNOSIS — C163 Malignant neoplasm of pyloric antrum: Secondary | ICD-10-CM

## 2014-03-10 DIAGNOSIS — K209 Esophagitis, unspecified without bleeding: Secondary | ICD-10-CM

## 2014-03-10 DIAGNOSIS — E43 Unspecified severe protein-calorie malnutrition: Secondary | ICD-10-CM

## 2014-03-10 DIAGNOSIS — R197 Diarrhea, unspecified: Secondary | ICD-10-CM

## 2014-03-10 MED ORDER — SUCRALFATE 1 GM/10ML PO SUSP: 1.0000 g | Freq: Three times a day (TID) | ORAL | Status: DC

## 2014-03-10 MED ORDER — MAGIC MOUTHWASH W/LIDOCAINE: 5.0000 mL | Freq: Three times a day (TID) | ORAL | Status: DC | PRN

## 2014-03-10 NOTE — Patient Instructions (Signed)
Get swallow study.  As long as no esophageal ulcers are noted, start carafate after study and stop nystatin.  OK to keep doing magic mouthwash.

## 2014-03-16 ENCOUNTER — Telehealth (INDEPENDENT_AMBULATORY_CARE_PROVIDER_SITE_OTHER): Payer: Self-pay

## 2014-03-16 ENCOUNTER — Ambulatory Visit: Admission: RE | Admit: 2014-03-16 | Payer: Medicare Other | Source: Ambulatory Visit | Admitting: Radiation Oncology

## 2014-03-16 ENCOUNTER — Telehealth (INDEPENDENT_AMBULATORY_CARE_PROVIDER_SITE_OTHER): Payer: Self-pay | Admitting: General Surgery

## 2014-03-16 NOTE — Telephone Encounter (Signed)
Vinnie Level, the [facility] nurse for this patient, called to see if we could give a verbal order to allow them to continue doing a once a week visit to work on tube feeding and patient teaching through June 5th.  Informed her, after speaking with Dr. Marlowe Aschoff assistant, that this would be fine.  Informed them this would be fine and that I would let Dr. Barry Dienes know.

## 2014-03-16 NOTE — Telephone Encounter (Signed)
Not my patient

## 2014-03-16 NOTE — Progress Notes (Signed)
HISTORY: Pt is around 4 weeks s/p distal gastrectomy/j tube for gastric cancer.  He has been doing j tube feeds, but having a fair amount of diarrhea.  This has improved somewhat.  He is having less esophageal pain, but is still complaining of swallowing difficulties.  He is unable to eat or drink really anything.  i got labs on him because of these complaints, and his albumin is ok, electrolytes are OK other than mild hyponatremia.  Creatinine is 0.8 (baseline 0.77), wbcs 5.6.  He has been using the magic mouthwash i prescribed him at last visit.     ROS: O/w negative x 11.    EXAM: General:  Alert and oriented.   Incision:  Healing well.     PATHOLOGY: Diagnosis 1. Stomach, resection for tumor, distal gastrectomy - RESIDUAL INVASIVE ADENOCARCINOMA, INTESTINAL TYPE, INVADING INTO THE SUBMUCOSA. - BACKGROUND EXTENSIVE CHRONIC ATROPHIC GASTRITIS WITH INTESTINAL METAPLASIA. - NINE LYMPH NODES, NEGATIVE FOR METASTATIC CARCINOMA (0/9). - RESECTION MARGINS NEGATIVE FOR MALIGNANCY. 2. Lymph nodes, regional resection, gastro hepatic - TWO LYMPH NODES, NEGATIVE FOR METASTATIC CARCINOMA (0/2).   ASSESSMENT AND PLAN:   Severe protein-calorie malnutrition Continue tube feeds.    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 Benay Spice.    Acute esophagitis Slight improvement on nystatin and magic mouthwash, but still having issues swallowing.   Will get swallow evaluation.  If negative, will likely need endoscopy.  Continue carafate.         Milus Height, MD Surgical Oncology, [facility]   Melinda Crutch, MD Melinda Crutch, MD

## 2014-03-16 NOTE — Assessment & Plan Note (Signed)
Continue lomotil

## 2014-03-16 NOTE — Telephone Encounter (Signed)
Kyle Armstrong /ADV states patient is having nausea with all G-Tube feeding and if he could have zofran 4mg  ODT ordered. Please advise  Kyle Armstrong # [phone] patient [facility]  pharmacy# [phone]

## 2014-03-16 NOTE — Assessment & Plan Note (Signed)
Follow up with Dr. Benay Spice.

## 2014-03-16 NOTE — Assessment & Plan Note (Signed)
Continue tube feeds.

## 2014-03-16 NOTE — Assessment & Plan Note (Signed)
Slight improvement on nystatin and magic mouthwash, but still having issues swallowing.   Will get swallow evaluation.  If negative, will likely need endoscopy.  Continue carafate.

## 2014-03-17 ENCOUNTER — Other Ambulatory Visit (INDEPENDENT_AMBULATORY_CARE_PROVIDER_SITE_OTHER): Payer: Self-pay | Admitting: General Surgery

## 2014-03-17 ENCOUNTER — Telehealth (INDEPENDENT_AMBULATORY_CARE_PROVIDER_SITE_OTHER): Payer: Self-pay

## 2014-03-17 ENCOUNTER — Other Ambulatory Visit (INDEPENDENT_AMBULATORY_CARE_PROVIDER_SITE_OTHER): Payer: Self-pay | Admitting: *Deleted

## 2014-03-17 DIAGNOSIS — R1310 Dysphagia, unspecified: Secondary | ICD-10-CM

## 2014-03-17 NOTE — Telephone Encounter (Signed)
Zofran 4mg  dissolving tablets #30 w/ no refills called in to [facility].  They will contact the patient.  Pt would also like to substitute Carafate Rx with Sucralfate 1 mg dissolving tablets.  Request sent to Dr. Barry Dienes.

## 2014-03-17 NOTE — Telephone Encounter (Signed)
Sucralfate 1 gm 3 x day x 1 month with 3 refills called to [facility].  Pt requested substitute for Carafate due to cost.  Pt will be notified by pharmacy when the Rx is filled.

## 2014-03-20 ENCOUNTER — Telehealth: Payer: Self-pay | Admitting: *Deleted

## 2014-03-20 ENCOUNTER — Other Ambulatory Visit: Payer: Self-pay | Admitting: *Deleted

## 2014-03-20 NOTE — Telephone Encounter (Signed)
Call from pt's daughter requesting to reschedule office visit to week of 5/4. Pt has a procedure 4/28 and thinks the 4/29 appt is too soon. Order sent to schedulers to reschedule.

## 2014-03-21 ENCOUNTER — Ambulatory Visit
Admission: RE | Admit: 2014-03-21 | Discharge: 2014-03-21 | Disposition: A | Discharge status: Home / Self Care | Payer: Medicare Other | Source: Ambulatory Visit | Attending: General Surgery | Admitting: General Surgery

## 2014-03-21 ENCOUNTER — Telehealth (INDEPENDENT_AMBULATORY_CARE_PROVIDER_SITE_OTHER): Payer: Self-pay | Admitting: General Surgery

## 2014-03-21 DIAGNOSIS — R1310 Dysphagia, unspecified: Secondary | ICD-10-CM

## 2014-03-21 NOTE — Telephone Encounter (Signed)
Discussed findings of swallow with patient and with Dr. Watt Climes.  He appears to have a short segment esophageal stricture.  Dr. Perley Jain office to set him up for appt +/- endoscopy.

## 2014-03-22 ENCOUNTER — Ambulatory Visit: Payer: Medicare Other | Admitting: Nurse Practitioner

## 2014-03-22 ENCOUNTER — Telehealth: Payer: Self-pay | Admitting: Oncology

## 2014-03-22 NOTE — Telephone Encounter (Signed)
s.w. pt and advised on appt...pt needed 10am appt gv him first available...pt ok and aware

## 2014-03-23 ENCOUNTER — Other Ambulatory Visit: Payer: Self-pay | Admitting: Gastroenterology

## 2014-03-27 ENCOUNTER — Ambulatory Visit: Payer: Medicare Other | Admitting: Nurse Practitioner

## 2014-03-28 ENCOUNTER — Telehealth (INDEPENDENT_AMBULATORY_CARE_PROVIDER_SITE_OTHER): Payer: Self-pay

## 2014-03-28 ENCOUNTER — Other Ambulatory Visit (INDEPENDENT_AMBULATORY_CARE_PROVIDER_SITE_OTHER): Payer: Self-pay | Admitting: General Surgery

## 2014-03-28 ENCOUNTER — Ambulatory Visit (HOSPITAL_COMMUNITY)
Admission: RE | Admit: 2014-03-28 | Discharge: 2014-03-28 | Disposition: A | Discharge status: Home / Self Care | Payer: Medicare Other | Source: Ambulatory Visit | Attending: General Surgery | Admitting: General Surgery

## 2014-03-28 DIAGNOSIS — T85598A Other mechanical complication of other gastrointestinal prosthetic devices, implants and grafts, initial encounter: Secondary | ICD-10-CM

## 2014-03-28 NOTE — Telephone Encounter (Signed)
Called patient to give advice for trying to unclog tube.  Patient reports he's tried coca cola and it has not helped.  Patient scheduled today @ 3:00pm @ [place] for replacement of tube.

## 2014-03-28 NOTE — Progress Notes (Signed)
Patient came in with c/o clogged surgically placed jejunostomy tube.  He stated attempts had been made at home to open the catheter with coke and a 60 cc syringe.  The catheter looked intact with no breakdown.  I attempted to flush the catheter using a catheter adapted and a 10 cc syringe.  The catheter became unclogged with little resistance.  It was flushed several times using the 10 cc syringe before using  the 60 cc syringe the patient uses at home.  The patient was happy with the outcome and did not have any additional concerns.  The patient went home with the catheter adapter and extra 10 cc syringes and told how/when to use them if it happens again. Kyle Armstrong  He was advised to call us with any additional concerns.

## 2014-03-28 NOTE — Telephone Encounter (Signed)
Patient calling into office to report that he attempting tube feedings last night which were unsuccessful.  Patient states his tube is clogged.  Patient status post Laparoscopic Diagnostic Distal Gastrostomy Feeding Jejunostomy on 02/15/14.  Patient advised I will page Dr. Barry Dienes to review and will contact patient.

## 2014-03-31 ENCOUNTER — Ambulatory Visit (HOSPITAL_BASED_OUTPATIENT_CLINIC_OR_DEPARTMENT_OTHER): Payer: Medicare Other | Admitting: Nurse Practitioner

## 2014-03-31 VITALS — BP 98/62 | HR 95 | Temp 97.9°F | Resp 17 | Ht 70.0 in | Wt 154.7 lb

## 2014-03-31 DIAGNOSIS — R5383 Other fatigue: Secondary | ICD-10-CM

## 2014-03-31 DIAGNOSIS — D500 Iron deficiency anemia secondary to blood loss (chronic): Secondary | ICD-10-CM

## 2014-03-31 DIAGNOSIS — R1310 Dysphagia, unspecified: Secondary | ICD-10-CM

## 2014-03-31 DIAGNOSIS — D6481 Anemia due to antineoplastic chemotherapy: Secondary | ICD-10-CM

## 2014-03-31 DIAGNOSIS — C169 Malignant neoplasm of stomach, unspecified: Secondary | ICD-10-CM

## 2014-03-31 DIAGNOSIS — T451X5A Adverse effect of antineoplastic and immunosuppressive drugs, initial encounter: Secondary | ICD-10-CM

## 2014-03-31 DIAGNOSIS — R5381 Other malaise: Secondary | ICD-10-CM

## 2014-03-31 NOTE — Progress Notes (Addendum)
[facility] OFFICE PROGRESS NOTE   Diagnosis:  Gastric cancer.  INTERVAL HISTORY:   [name] underwent a distal gastrectomy with Billroth II anastomosis and placement of a feeding jejunostomy by Dr. Barry Dienes on 02/15/2014. Surgical findings included a firm site in the antrum, no residual mass. Remainder of stomach soft. There was no evidence of carcinomatosis.  Final pathology showed residual invasive adenocarcinoma, 2.7 cm, intestinal type, invading into the submucosa; background extensive chronic atrophic gastritis with intestinal metaplasia; 9 lymph nodes negative for metastatic carcinoma; resection margins negative; 2 gastrohepatic lymph nodes negative for metastatic carcinoma.  Barium swallow on 03/21/2014 showed the barium pill lodged in the distal esophagus consistent with a short segment distal esophageal stricture; moderate gastroesophageal reflux. There was no aspiration.  He reports undergoing an upper endoscopy by Dr. Watt Climes on 03/23/2014. We do not have the procedure note. Biopsy of the esophagus showed inflamed squamous mucosa with surface fibrinopurulent material consistent with ulcer bed. There was no evidence of dysplasia or malignancy.  He is utilizing the feeding tube 12 hours a day. He notes pain with swallowing. The pain varies in intensity. He is taking Protonix twice a day and also takes Carafate. He is ambulating with a walker. Bowels moving though he is intermittently constipated. No fever, cough or shortness of breath.  Objective:  Vital signs in last 24 hours:  Blood pressure 98/62, pulse 95, temperature 97.9 F (36.6 C), temperature source Oral, resp. rate 17, height $RemoveBe'5\' 10"'BVyMkHBuu$  (1.778 m), weight 154 lb 11.2 oz (70.171 kg), SpO2 97.00%.    HEENT: No thrush or ulcerations. Lymphatics: No palpable cervical, supraclavicular or axillary lymph nodes. Resp: Inspiratory rales at both lung bases. No respiratory distress. Cardio: Regular cardiac rhythm. GI:  Abdomen soft and nontender. No hepatomegaly. Feeding tube left upper abdomen; feeding tube site is unremarkable. Vascular: No leg edema. The left lower leg appear slightly larger than the right lower leg.  Lab Results:  Lab Results  Component Value Date   WBC 5.6 03/08/2014   HGB 10.3* 03/08/2014   HCT 32.3* 03/08/2014   MCV 89.2 03/08/2014   PLT 258 03/08/2014   NEUTROABS 2.7 02/09/2014    Imaging:  No results found.  Medications: I have reviewed the patient's current medications.  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 HER-2/neu amplified  Staging chest CT negative on 10/27/2013.  Initiation radiation and concurrent Xeloda 11/10/2013, completed 12/21/2013  Restaging CTs of the chest, abdomen, and pelvis on 01/25/2014 with persistent antral thickening, no evidence of metastatic disease. Distal gastrectomy with Billroth II anastomosis 02/15/2014 (y pT1b, y pN0).  2. Diffuse "gastritis "change noted on endoscopies 08/19/2013 and 10/06/2013 with biopsies confirming atrophic gastritis. Low and high-grade dysplasia were noted on the biopsy 08/19/2013  3. Anemia-likely secondary to GI blood loss and chemotherapy/radiation. Hemoglobin improved 01/12/2014.  4. History of anorexia/weight loss.  5. History of colon polyp.  6. History of "sarcoidosis ", pulmonary fibrosis noted on the chest CT 10/27/2013.  7. History of tobacco use.  8. History of arrhythmia, status post an ablation August 2013.  9. Malaise-likely secondary to chemotherapy/radiation and malnutrition.  10. Odynophagia. Question reflux esophagitis. He will continue PPI therapy and Carafate as well as followup with Dr. Watt Climes. 11. Nutrition. Tube feeding is main source of nutrition.    Disposition: Kyle Armstrong has stage I gastric cancer. He continues to recover from the recent gastrectomy. Dr. Benay Spice  reviewed the surgical pathology with Kyle Armstrong and his daughter. Dr. Benay Spice does not recommend adjuvant therapy given his current performance status.  He will return for a followup visit in 3 months.   Patient seen with Dr. Benay Spice.    Owens Shark ANP/GNP-BC   03/31/2014  12:27 PM  This was a shared visit with Ned Card.he is recovering from the gastrectomy procedure. The pathology reveals early stage disease. I think it would be difficult for him to tolerate adjuvant therapy. We will follow him with observation.  Julieanne [name], M.D.

## 2014-04-01 ENCOUNTER — Telehealth: Payer: Self-pay | Admitting: Oncology

## 2014-04-01 NOTE — Telephone Encounter (Signed)
Talked to pt and gave him appt for August 2015,MD only

## 2014-04-27 ENCOUNTER — Encounter: Payer: Self-pay | Admitting: Oncology

## 2014-04-28 ENCOUNTER — Encounter (INDEPENDENT_AMBULATORY_CARE_PROVIDER_SITE_OTHER): Payer: Self-pay

## 2014-05-06 ENCOUNTER — Telehealth: Payer: Self-pay | Admitting: Oncology

## 2014-05-06 NOTE — Telephone Encounter (Signed)
PAL - moved 8/13 appt to 8/28. s/w pt he is aware.

## 2014-05-15 ENCOUNTER — Ambulatory Visit (INDEPENDENT_AMBULATORY_CARE_PROVIDER_SITE_OTHER): Payer: Medicare Other | Admitting: General Surgery

## 2014-05-15 ENCOUNTER — Encounter (INDEPENDENT_AMBULATORY_CARE_PROVIDER_SITE_OTHER): Payer: Self-pay | Admitting: General Surgery

## 2014-05-15 VITALS — BP 122/72 | HR 72 | Temp 98.3°F | Ht 68.0 in | Wt 161.0 lb

## 2014-05-15 DIAGNOSIS — K9413 Enterostomy malfunction: Secondary | ICD-10-CM

## 2014-05-15 DIAGNOSIS — K9403 Colostomy malfunction: Secondary | ICD-10-CM

## 2014-05-15 NOTE — Progress Notes (Signed)
HISTORY: Pt is here with a clogged J tube.  He had an antrectomy a little under 3 months ago.  He had an esophageal stricture that was dilated.  He was also having a fair amount of bile reflux.  He is doing much better.  He cut down the amount of J tube feeds and went to night feedings.  He then had the J tube clog.  His son attempted to unclog the tube, and the side of the tube ruptured.  He is doing OK otherwise.     PERTINENT REVIEW OF SYSTEMS: O/w neg x 11.    Filed Vitals:   05/15/14 1524  BP: 122/72  Pulse: 72  Temp: 98.3 F (36.8 C)   Wt Readings from Last 3 Encounters:  05/15/14 161 lb (73.029 kg)  03/31/14 154 lb 11.2 oz (70.171 kg)  03/10/14 156 lb 3.2 oz (70.852 kg)    EXAM: Head: Normocephalic and atraumatic.  Eyes:  Conjunctivae are normal. Pupils are equal, round, and reactive to light. No scleral icterus.  Resp: No respiratory distress, normal effort. Abd:  Abdomen is soft, non distended and non tender. No masses are palpable.  There is no rebound and no guarding. J tube in place.  Hole in tube wall distal from sutures around 1 cm.    Neurological: Alert and oriented to person, place, and time. Coordination normal. Hard of hearing.   Skin: Skin is warm and dry. No rash noted. No diaphoretic. No erythema. No pallor.  Psychiatric: Normal mood and affect. Normal behavior. Judgment and thought content normal.      ASSESSMENT AND PLAN:   Malfunctioning jejunostomy tube Pt feels that his PO intake has improved dramatically.  He has also gone down on the amount of feeds he is taking.  We are going to leave the clogged J tube and recheck weight in 2 weeks. If weight is stable, then we will pull tube.        Milus Height, MD Surgical Oncology, [facility]   Melinda Crutch, MD No ref. provider found

## 2014-05-15 NOTE — Assessment & Plan Note (Signed)
Pt feels that his PO intake has improved dramatically.  He has also gone down on the amount of feeds he is taking.  We are going to leave the clogged J tube and recheck weight in 2 weeks. If weight is stable, then we will pull tube.

## 2014-05-15 NOTE — Patient Instructions (Signed)
Follow up in 2 weeks.  Keep eating lots of protein.    Drink at least 2 boosts/day.  Don't lose weight!

## 2014-05-29 ENCOUNTER — Ambulatory Visit (INDEPENDENT_AMBULATORY_CARE_PROVIDER_SITE_OTHER): Payer: Medicare Other | Admitting: General Surgery

## 2014-05-29 ENCOUNTER — Other Ambulatory Visit (INDEPENDENT_AMBULATORY_CARE_PROVIDER_SITE_OTHER): Payer: Self-pay | Admitting: General Surgery

## 2014-05-29 ENCOUNTER — Encounter (INDEPENDENT_AMBULATORY_CARE_PROVIDER_SITE_OTHER): Payer: Self-pay | Admitting: General Surgery

## 2014-05-29 VITALS — BP 126/80 | HR 80 | Temp 97.5°F | Ht 68.0 in | Wt 163.0 lb

## 2014-05-29 DIAGNOSIS — C163 Malignant neoplasm of pyloric antrum: Secondary | ICD-10-CM

## 2014-05-29 DIAGNOSIS — IMO0002 Reserved for concepts with insufficient information to code with codable children: Secondary | ICD-10-CM

## 2014-05-29 NOTE — Progress Notes (Signed)
HISTORY: Pt is here with a clogged J tube.  He had an antrectomy 01/2014.  He had an esophageal stricture that was dilated.  He was also having a fair amount of bile reflux.  He is doing much better. His J tube clogged and we left it in place.  He has successfully gained more weight in the last 2 weeks.  He is eating whatever he wants in good amounts.    PERTINENT REVIEW OF SYSTEMS: O/w neg x 11.    Filed Vitals:   05/29/14 1156  BP: 126/80  Pulse: 80  Temp: 97.5 F (36.4 C)   Wt Readings from Last 3 Encounters:  05/29/14 163 lb (73.936 kg)  05/15/14 161 lb (73.029 kg)  03/31/14 154 lb 11.2 oz (70.171 kg)    EXAM: Head: Normocephalic and atraumatic.  Eyes:  Conjunctivae are normal. Pupils are equal, round, and reactive to light. No scleral icterus.  Resp: No respiratory distress, normal effort. Abd:  Abdomen is soft, non distended and non tender. No masses are palpable.  There is no rebound and no guarding. J tube in place.  Suture cut and tube removed.     Neurological: Alert and oriented to person, place, and time. Coordination normal. Hard of hearing.   Skin: Skin is warm and dry. No rash noted. No diaphoretic. No erythema. No pallor.  Psychiatric: Normal mood and affect. Normal behavior. Judgment and thought content normal.      ASSESSMENT AND PLAN:   Cancer of antrum of stomach s/p distal gastrectomy/B2/feeding jejunostomy 02/15/2014 Doing well. Not going to get adjuvant therapy due to performance status.  Gaining weight back J tube pulled.  Follow up in 3 months.       Milus Height, MD Surgical Oncology, [facility]   Melinda Crutch, MD Melinda Crutch, MD

## 2014-05-29 NOTE — Assessment & Plan Note (Signed)
Doing well. Not going to get adjuvant therapy due to performance status.  Gaining weight back J tube pulled.  Follow up in 3 months.

## 2014-05-29 NOTE — Patient Instructions (Signed)
Follow up in 3 months.  Let us know if you are going to be going back to work so we can fill out paperwork.    Keep J tube site covered while draining.

## 2014-06-20 ENCOUNTER — Telehealth: Payer: Self-pay | Admitting: *Deleted

## 2014-06-20 MED ORDER — FERROUS SULFATE 325 (65 FE) MG PO TBEC: 325.0000 mg | DELAYED_RELEASE_TABLET | Freq: Two times a day (BID) | ORAL | Status: DC

## 2014-06-20 NOTE — Telephone Encounter (Signed)
Called patient and he confirms he started iron twice daily. Reports he has history of iron deficiency anemia. Says he is tired, but not dizzy or short of breath. Dr. Watt Climes told him to return in 2 weeks to PCP to recheck counts. Informed him to let PCP know if he feels he needs transfusion prior to his visit here on 8/28.

## 2014-06-20 NOTE — Telephone Encounter (Signed)
Received CBC results from [facility] that were done on 06/16/14 showing Hgb 7.6. Called office in inquire if this is a request for action on Dr. Gearldine Shown part or FYI? Has anything been done to address this? Dr. Jordan Hawks Rankins had ordered lab and Dr. Watt Climes instructed patient to take ferrous sulfate bid till seen by Dr. Benay Spice.

## 2014-07-06 ENCOUNTER — Ambulatory Visit: Payer: Medicare Other | Admitting: Oncology

## 2014-07-21 ENCOUNTER — Ambulatory Visit (HOSPITAL_BASED_OUTPATIENT_CLINIC_OR_DEPARTMENT_OTHER): Payer: Medicare Other | Admitting: Oncology

## 2014-07-21 ENCOUNTER — Ambulatory Visit (HOSPITAL_BASED_OUTPATIENT_CLINIC_OR_DEPARTMENT_OTHER): Payer: Medicare Other

## 2014-07-21 ENCOUNTER — Telehealth: Payer: Self-pay | Admitting: Oncology

## 2014-07-21 VITALS — BP 88/47 | HR 79 | Temp 98.6°F | Resp 19 | Ht 68.0 in | Wt 161.5 lb

## 2014-07-21 DIAGNOSIS — C163 Malignant neoplasm of pyloric antrum: Secondary | ICD-10-CM

## 2014-07-21 DIAGNOSIS — D649 Anemia, unspecified: Secondary | ICD-10-CM

## 2014-07-21 DIAGNOSIS — R5381 Other malaise: Secondary | ICD-10-CM

## 2014-07-21 DIAGNOSIS — R5383 Other fatigue: Secondary | ICD-10-CM

## 2014-07-21 LAB — CBC WITH DIFFERENTIAL/PLATELET
BASO%: 0.7 % (ref 0.0–2.0)
Basophils Absolute: 0 10*3/uL (ref 0.0–0.1)
EOS ABS: 0.6 10*3/uL — AB (ref 0.0–0.5)
EOS%: 13 % — ABNORMAL HIGH (ref 0.0–7.0)
HCT: 32.8 % — ABNORMAL LOW (ref 38.4–49.9)
HGB: 10.2 g/dL — ABNORMAL LOW (ref 13.0–17.1)
LYMPH#: 0.6 10*3/uL — AB (ref 0.9–3.3)
LYMPH%: 12.2 % — ABNORMAL LOW (ref 14.0–49.0)
MCH: 26 pg — ABNORMAL LOW (ref 27.2–33.4)
MCHC: 31.1 g/dL — AB (ref 32.0–36.0)
MCV: 83.5 fL (ref 79.3–98.0)
MONO#: 0.4 10*3/uL (ref 0.1–0.9)
MONO%: 8.7 % (ref 0.0–14.0)
NEUT#: 3 10*3/uL (ref 1.5–6.5)
NEUT%: 65.4 % (ref 39.0–75.0)
PLATELETS: 163 10*3/uL (ref 140–400)
RBC: 3.93 10*6/uL — AB (ref 4.20–5.82)
WBC: 4.6 10*3/uL (ref 4.0–10.3)

## 2014-07-21 NOTE — Progress Notes (Signed)
  [place] [place] OFFICE PROGRESS NOTE   Diagnosis: Gastric cancer  INTERVAL HISTORY:   He returns as scheduled. His only complaint is malaise. He is eating. The feeding tube was removed last month. No pain. He is taking iron 3 times daily. He reports the hemoglobin returned at "8 "when he was seen by Dr. Watt Climes last month.  Objective:  Vital signs in last 24 hours:  Blood pressure 88/47, pulse 79, temperature 98.6 F (37 C), temperature source Oral, resp. rate 19, height $RemoveBe'5\' 8"'NxwmXgvQB$  (1.727 m), weight 161 lb 8 oz (73.256 kg).    HEENT: Neck without mass Lymphatics: No cervical, supraclavicular, or axillary nodes Resp: Inspiratory rales at the lower posterior chest bilaterally, no respiratory distress Cardio: Regular rate and rhythm GI: No hepatomegaly, nontender, no mass Vascular: Trace edema at the left greater than right lower leg   Lab Results:  Lab Results  Component Value Date   WBC 5.6 03/08/2014   HGB 10.3* 03/08/2014   HCT 32.3* 03/08/2014   MCV 89.2 03/08/2014   PLT 258 03/08/2014   NEUTROABS 2.7 02/09/2014     Medications: I have reviewed the patient's current medications.  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 HER-2/neu amplified  Staging chest CT negative on 10/27/2013.  Initiation radiation and concurrent Xeloda 11/10/2013, completed 12/21/2013  Restaging CTs of the chest, abdomen, and pelvis on 01/25/2014 with persistent antral thickening, no evidence of metastatic disease.  Distal gastrectomy with Billroth II anastomosis 02/15/2014 (y pT1b, y pN0).  2. Diffuse "gastritis "change noted on endoscopies 08/19/2013 and 10/06/2013 with biopsies confirming atrophic gastritis. Low and high-grade dysplasia were noted on the biopsy 08/19/2013  3. Anemia-likely secondary to GI blood loss, surgery, chemotherapy/radiation, and malnutrition 4.  History of anorexia/weight loss.  5. History of colon polyp.  6. History of "sarcoidosis ", pulmonary fibrosis noted on the chest CT 10/27/2013.  7. History of tobacco use.  8. History of arrhythmia, status post an ablation August 2013.  9. Malaise-secondary to the gastrectomy and anemia  Disposition:  He continues to recover from the gastrectomy procedure. He is now maintaining his nutrition with an oral diet. We will we'll check his hemoglobin today. He will return for an office visit in 3 months. He will see Dr. Barry Dienes in the interim.  Betsy Coder, MD  07/21/2014  9:24 AM

## 2014-07-21 NOTE — Telephone Encounter (Signed)
Pt confirmed labs/ov per 08/28 POF, gave pt AVS....KJ

## 2014-09-16 ENCOUNTER — Encounter: Payer: Self-pay | Admitting: *Deleted

## 2014-10-20 ENCOUNTER — Telehealth: Payer: Self-pay | Admitting: Oncology

## 2014-10-20 NOTE — Telephone Encounter (Signed)
pt called to r/s appt due to going back to work.....done....pt aware of new d.t

## 2014-10-23 ENCOUNTER — Other Ambulatory Visit: Payer: Medicare Other

## 2014-10-23 ENCOUNTER — Ambulatory Visit: Payer: Medicare Other | Admitting: Nurse Practitioner

## 2014-10-25 ENCOUNTER — Telehealth: Payer: Self-pay | Admitting: Oncology

## 2014-10-25 ENCOUNTER — Other Ambulatory Visit: Payer: Medicare Other

## 2014-10-25 ENCOUNTER — Ambulatory Visit (HOSPITAL_BASED_OUTPATIENT_CLINIC_OR_DEPARTMENT_OTHER): Payer: Medicare Other | Admitting: Nurse Practitioner

## 2014-10-25 VITALS — BP 103/60 | HR 79 | Temp 98.2°F | Resp 18 | Ht 68.0 in | Wt 166.3 lb

## 2014-10-25 DIAGNOSIS — R5381 Other malaise: Secondary | ICD-10-CM

## 2014-10-25 DIAGNOSIS — C163 Malignant neoplasm of pyloric antrum: Secondary | ICD-10-CM

## 2014-10-25 DIAGNOSIS — D649 Anemia, unspecified: Secondary | ICD-10-CM

## 2014-10-25 NOTE — Progress Notes (Signed)
  [place] OFFICE PROGRESS NOTE   Diagnosis:  Gastric cancer  INTERVAL HISTORY:   He returns as scheduled. He feels well. He has returned to work part-time. Energy level varies. He has a good appetite. No dysphagia. No nausea or vomiting. Bowels moving regularly. No hematuria or dysuria. He denies pain. No shortness of breath.  Objective:  Vital signs in last 24 hours:  Blood pressure 103/60, pulse 79, temperature 98.2 F (36.8 C), temperature source Oral, resp. rate 18, height $RemoveBe'5\' 8"'ApDoDfPwc$  (1.727 m), weight 166 lb 4.8 oz (75.433 kg).    HEENT: no thrush or ulcers. Lymphatics: no palpable cervical, supraclavicular or axillary lymph nodes. Resp: inspiratory rales at the lung bases bilaterally. No respiratory distress. Cardio: regular rate and rhythm. GI: abdomen soft and nontender. No hepatomegaly. No mass. Vascular: no edema. Left lower leg/ankle appears slightly larger than the right lower leg.   Lab Results:  Lab Results  Component Value Date   WBC 4.6 07/21/2014   HGB 10.2* 07/21/2014   HCT 32.8* 07/21/2014   MCV 83.5 07/21/2014   PLT 163 07/21/2014   NEUTROABS 3.0 07/21/2014    Imaging:  No results found.  Medications: I have reviewed the patient's current medications.  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 HER-2/neu amplified   Staging chest CT negative on 10/27/2013.   Initiation radiation and concurrent Xeloda 11/10/2013, completed 12/21/2013   Restaging CTs of the chest, abdomen, and pelvis on 01/25/2014 with persistent antral thickening, no evidence of metastatic disease.   Distal gastrectomy with Billroth II anastomosis 02/15/2014 (y pT1b, y pN0). 2. Diffuse "gastritis "change noted on endoscopies 08/19/2013 and 10/06/2013 with biopsies confirming atrophic gastritis. Low and high-grade dysplasia were noted on the  biopsy 08/19/2013  3. Anemia-likely secondary to GI blood loss, surgery, chemotherapy/radiation, and malnutrition 4. History of anorexia/weight loss.  5. History of colon polyp.  6. History of "sarcoidosis ", pulmonary fibrosis noted on the chest CT 10/27/2013.  7. History of tobacco use.  8. History of arrhythmia, status post an ablation August 2013.  9. Malaise-secondary to the gastrectomy and anemia   Disposition: Mr. Flessner appears stable. He remains in clinical remission from gastric cancer. He will return for a followup visit in 3 months. He will contact the office in the interim with any problems.    Ned Card ANP/GNP-BC   10/25/2014  3:00 PM

## 2014-10-25 NOTE — Telephone Encounter (Signed)
gv and printed appt sched anda vs for pt for March.... °

## 2014-11-02 ENCOUNTER — Encounter (HOSPITAL_COMMUNITY): Payer: Self-pay | Admitting: Internal Medicine

## 2014-12-09 ENCOUNTER — Telehealth: Payer: Self-pay | Admitting: Oncology

## 2014-12-09 NOTE — Telephone Encounter (Signed)
lvm for pt regarding appt d.t change due to MD on call....pt ok and aware °

## 2014-12-20 DIAGNOSIS — E538 Deficiency of other specified B group vitamins: Secondary | ICD-10-CM | POA: Diagnosis not present

## 2014-12-26 DIAGNOSIS — M5416 Radiculopathy, lumbar region: Secondary | ICD-10-CM | POA: Diagnosis not present

## 2014-12-26 DIAGNOSIS — N529 Male erectile dysfunction, unspecified: Secondary | ICD-10-CM | POA: Diagnosis not present

## 2015-01-19 DIAGNOSIS — R0602 Shortness of breath: Secondary | ICD-10-CM | POA: Diagnosis not present

## 2015-01-19 DIAGNOSIS — R531 Weakness: Secondary | ICD-10-CM | POA: Diagnosis not present

## 2015-01-19 DIAGNOSIS — D510 Vitamin B12 deficiency anemia due to intrinsic factor deficiency: Secondary | ICD-10-CM | POA: Diagnosis not present

## 2015-02-13 ENCOUNTER — Telehealth: Payer: Self-pay | Admitting: *Deleted

## 2015-02-13 ENCOUNTER — Telehealth: Payer: Self-pay | Admitting: Oncology

## 2015-02-13 ENCOUNTER — Ambulatory Visit (HOSPITAL_BASED_OUTPATIENT_CLINIC_OR_DEPARTMENT_OTHER): Payer: Commercial Managed Care - HMO | Admitting: Oncology

## 2015-02-13 VITALS — BP 97/61 | HR 82 | Temp 98.1°F | Resp 18 | Ht 68.0 in | Wt 170.8 lb

## 2015-02-13 DIAGNOSIS — R5381 Other malaise: Secondary | ICD-10-CM

## 2015-02-13 DIAGNOSIS — Z8500 Personal history of malignant neoplasm of unspecified digestive organ: Secondary | ICD-10-CM

## 2015-02-13 DIAGNOSIS — D510 Vitamin B12 deficiency anemia due to intrinsic factor deficiency: Secondary | ICD-10-CM

## 2015-02-13 NOTE — Progress Notes (Signed)
  [name]   Diagnosis: Gastric cancer  INTERVAL HISTORY:   Kyle Armstrong returns as scheduled. No dysphagia. Good appetite. He works 2 days per week. He complains of malaise. He has dyspnea on we will significant exertion.  Objective:  Vital signs in last 24 hours:  Blood pressure 97/61, pulse 82, temperature 98.1 F (36.7 C), temperature source Oral, resp. rate 18, height $RemoveBe'5\' 8"'HcTPrTQHB$  (1.727 m), weight 170 lb 12.8 oz (77.474 kg), SpO2 98 %.    HEENT: Neck without mass Lymphatics: No cervical, supra-clavicular, or axillary nodes Resp: Diffuse inspiratory rales bilaterally, no respiratory distress Cardio: Regular rate and rhythm GI: No hepatomegaly, nontender, no mass Vascular: Left lower leg is larger than the right side, no edema   Lab Results:  Lab Results  Component Value Date   WBC 4.6 07/21/2014   HGB 10.2* 07/21/2014   HCT 32.8* 07/21/2014   MCV 83.5 07/21/2014   PLT 163 07/21/2014   NEUTROABS 3.0 07/21/2014    Medications: I have reviewed the patient's current medications.  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 HER-2/neu amplified   Staging chest CT negative on 10/27/2013.   Initiation radiation and concurrent Xeloda 11/10/2013, completed 12/21/2013   Restaging CTs of the chest, abdomen, and pelvis on 01/25/2014 with persistent antral thickening, no evidence of metastatic disease.   Distal gastrectomy with Billroth II anastomosis 02/15/2014 (y pT1b, y pN0). 2. Diffuse "gastritis "change noted on endoscopies 08/19/2013 and 10/06/2013 with biopsies confirming atrophic gastritis. Low and high-grade dysplasia were noted on the biopsy 08/19/2013  3. History of Anemia-likely secondary to GI blood loss, surgery, chemotherapy/radiation, and malnutrition 4. History of anorexia/weight loss.  5. History of  colon polyp.  6. History of "sarcoidosis ", pulmonary fibrosis noted on the chest CT 10/27/2013.  7. History of tobacco use.  8. History of arrhythmia, status post an ablation August 2013.  9. Most likely secondary to the pulmonary fibrosis and gastrectomy  Disposition:  Kyle Armstrong appears unchanged. He continues to complain of malaise. I have a low suspicion for recurrent gastric cancer. I will follow-up on the recent laboratory evaluation by Dr. Harrington Challenger. Kyle Armstrong will return for an office visit and CBC in 4 months.  Betsy Coder, MD  02/13/2015  11:12 AM

## 2015-02-13 NOTE — Telephone Encounter (Signed)
Gave avs & calendar for July °

## 2015-02-13 NOTE — Telephone Encounter (Addendum)
Spoke with Constance Holster at [employer] [facility]. Requested copy of most recent CBC, CMET and TSH.  1500: Results received, to Dr. Gearldine Shown desk for review.

## 2015-02-20 DIAGNOSIS — E538 Deficiency of other specified B group vitamins: Secondary | ICD-10-CM | POA: Diagnosis not present

## 2015-03-02 DIAGNOSIS — R933 Abnormal findings on diagnostic imaging of other parts of digestive tract: Secondary | ICD-10-CM | POA: Diagnosis not present

## 2015-03-02 DIAGNOSIS — D509 Iron deficiency anemia, unspecified: Secondary | ICD-10-CM | POA: Diagnosis not present

## 2015-03-02 DIAGNOSIS — R5381 Other malaise: Secondary | ICD-10-CM | POA: Diagnosis not present

## 2015-03-23 DIAGNOSIS — D510 Vitamin B12 deficiency anemia due to intrinsic factor deficiency: Secondary | ICD-10-CM | POA: Diagnosis not present

## 2015-03-23 DIAGNOSIS — C163 Malignant neoplasm of pyloric antrum: Secondary | ICD-10-CM | POA: Diagnosis not present

## 2015-04-16 DIAGNOSIS — M545 Low back pain: Secondary | ICD-10-CM | POA: Diagnosis not present

## 2015-04-25 DIAGNOSIS — M545 Low back pain: Secondary | ICD-10-CM | POA: Diagnosis not present

## 2015-05-04 DIAGNOSIS — M545 Low back pain: Secondary | ICD-10-CM | POA: Diagnosis not present

## 2015-05-11 DIAGNOSIS — M545 Low back pain: Secondary | ICD-10-CM | POA: Diagnosis not present

## 2015-05-14 DIAGNOSIS — M545 Low back pain: Secondary | ICD-10-CM | POA: Diagnosis not present

## 2015-05-18 DIAGNOSIS — M545 Low back pain: Secondary | ICD-10-CM | POA: Diagnosis not present

## 2015-05-21 DIAGNOSIS — D510 Vitamin B12 deficiency anemia due to intrinsic factor deficiency: Secondary | ICD-10-CM | POA: Diagnosis not present

## 2015-05-21 DIAGNOSIS — M545 Low back pain: Secondary | ICD-10-CM | POA: Diagnosis not present

## 2015-05-25 DIAGNOSIS — M545 Low back pain: Secondary | ICD-10-CM | POA: Diagnosis not present

## 2015-05-30 DIAGNOSIS — M545 Low back pain: Secondary | ICD-10-CM | POA: Diagnosis not present

## 2015-06-01 DIAGNOSIS — M545 Low back pain: Secondary | ICD-10-CM | POA: Diagnosis not present

## 2015-06-06 DIAGNOSIS — M545 Low back pain: Secondary | ICD-10-CM | POA: Diagnosis not present

## 2015-06-08 DIAGNOSIS — B359 Dermatophytosis, unspecified: Secondary | ICD-10-CM | POA: Diagnosis not present

## 2015-06-08 DIAGNOSIS — H52202 Unspecified astigmatism, left eye: Secondary | ICD-10-CM | POA: Diagnosis not present

## 2015-06-08 DIAGNOSIS — B029 Zoster without complications: Secondary | ICD-10-CM | POA: Diagnosis not present

## 2015-06-08 DIAGNOSIS — H4322 Crystalline deposits in vitreous body, left eye: Secondary | ICD-10-CM | POA: Diagnosis not present

## 2015-06-08 DIAGNOSIS — M545 Low back pain: Secondary | ICD-10-CM | POA: Diagnosis not present

## 2015-06-08 DIAGNOSIS — H2511 Age-related nuclear cataract, right eye: Secondary | ICD-10-CM | POA: Diagnosis not present

## 2015-06-08 DIAGNOSIS — H53001 Unspecified amblyopia, right eye: Secondary | ICD-10-CM | POA: Diagnosis not present

## 2015-06-12 ENCOUNTER — Ambulatory Visit: Payer: Commercial Managed Care - HMO | Admitting: Nurse Practitioner

## 2015-06-12 ENCOUNTER — Other Ambulatory Visit: Payer: Commercial Managed Care - HMO

## 2015-06-22 DIAGNOSIS — D510 Vitamin B12 deficiency anemia due to intrinsic factor deficiency: Secondary | ICD-10-CM | POA: Diagnosis not present

## 2015-06-29 DIAGNOSIS — M545 Low back pain: Secondary | ICD-10-CM | POA: Diagnosis not present

## 2015-07-05 DIAGNOSIS — B0229 Other postherpetic nervous system involvement: Secondary | ICD-10-CM | POA: Diagnosis not present

## 2015-07-05 DIAGNOSIS — Z0001 Encounter for general adult medical examination with abnormal findings: Secondary | ICD-10-CM | POA: Diagnosis not present

## 2015-07-05 DIAGNOSIS — Z23 Encounter for immunization: Secondary | ICD-10-CM | POA: Diagnosis not present

## 2015-07-05 DIAGNOSIS — E538 Deficiency of other specified B group vitamins: Secondary | ICD-10-CM | POA: Diagnosis not present

## 2015-07-05 DIAGNOSIS — N529 Male erectile dysfunction, unspecified: Secondary | ICD-10-CM | POA: Diagnosis not present

## 2015-07-05 DIAGNOSIS — E039 Hypothyroidism, unspecified: Secondary | ICD-10-CM | POA: Diagnosis not present

## 2015-07-05 DIAGNOSIS — D509 Iron deficiency anemia, unspecified: Secondary | ICD-10-CM | POA: Diagnosis not present

## 2015-07-05 DIAGNOSIS — E782 Mixed hyperlipidemia: Secondary | ICD-10-CM | POA: Diagnosis not present

## 2015-07-06 DIAGNOSIS — M545 Low back pain: Secondary | ICD-10-CM | POA: Diagnosis not present

## 2015-07-18 DIAGNOSIS — E538 Deficiency of other specified B group vitamins: Secondary | ICD-10-CM | POA: Diagnosis not present

## 2015-08-07 DIAGNOSIS — Z23 Encounter for immunization: Secondary | ICD-10-CM | POA: Diagnosis not present

## 2015-08-07 DIAGNOSIS — Z85028 Personal history of other malignant neoplasm of stomach: Secondary | ICD-10-CM | POA: Diagnosis not present

## 2015-08-07 DIAGNOSIS — R531 Weakness: Secondary | ICD-10-CM | POA: Diagnosis not present

## 2015-08-07 DIAGNOSIS — D649 Anemia, unspecified: Secondary | ICD-10-CM | POA: Diagnosis not present

## 2015-08-07 DIAGNOSIS — B0229 Other postherpetic nervous system involvement: Secondary | ICD-10-CM | POA: Diagnosis not present

## 2015-08-22 DIAGNOSIS — R5381 Other malaise: Secondary | ICD-10-CM | POA: Diagnosis not present

## 2015-08-22 DIAGNOSIS — D510 Vitamin B12 deficiency anemia due to intrinsic factor deficiency: Secondary | ICD-10-CM | POA: Diagnosis not present

## 2015-08-22 DIAGNOSIS — D649 Anemia, unspecified: Secondary | ICD-10-CM | POA: Diagnosis not present

## 2015-08-22 DIAGNOSIS — Z85028 Personal history of other malignant neoplasm of stomach: Secondary | ICD-10-CM | POA: Diagnosis not present

## 2015-08-23 ENCOUNTER — Other Ambulatory Visit: Payer: Self-pay | Admitting: *Deleted

## 2015-08-30 DIAGNOSIS — M549 Dorsalgia, unspecified: Secondary | ICD-10-CM | POA: Diagnosis not present

## 2015-09-04 ENCOUNTER — Ambulatory Visit
Admission: RE | Admit: 2015-09-04 | Discharge: 2015-09-04 | Disposition: A | Discharge status: Home / Self Care | Payer: Commercial Managed Care - HMO | Source: Ambulatory Visit | Attending: Family Medicine | Admitting: Family Medicine

## 2015-09-04 ENCOUNTER — Telehealth: Payer: Self-pay | Admitting: Oncology

## 2015-09-04 ENCOUNTER — Other Ambulatory Visit: Payer: Self-pay | Admitting: Family Medicine

## 2015-09-04 ENCOUNTER — Ambulatory Visit (HOSPITAL_BASED_OUTPATIENT_CLINIC_OR_DEPARTMENT_OTHER): Payer: Commercial Managed Care - HMO | Admitting: Nurse Practitioner

## 2015-09-04 ENCOUNTER — Other Ambulatory Visit (HOSPITAL_BASED_OUTPATIENT_CLINIC_OR_DEPARTMENT_OTHER): Payer: Commercial Managed Care - HMO

## 2015-09-04 VITALS — BP 90/62 | HR 80 | Temp 97.9°F | Resp 18 | Ht 68.0 in | Wt 167.3 lb

## 2015-09-04 DIAGNOSIS — M47816 Spondylosis without myelopathy or radiculopathy, lumbar region: Secondary | ICD-10-CM | POA: Diagnosis not present

## 2015-09-04 DIAGNOSIS — R21 Rash and other nonspecific skin eruption: Secondary | ICD-10-CM

## 2015-09-04 DIAGNOSIS — M549 Dorsalgia, unspecified: Secondary | ICD-10-CM

## 2015-09-04 DIAGNOSIS — D510 Vitamin B12 deficiency anemia due to intrinsic factor deficiency: Secondary | ICD-10-CM

## 2015-09-04 DIAGNOSIS — Z8500 Personal history of malignant neoplasm of unspecified digestive organ: Secondary | ICD-10-CM

## 2015-09-04 DIAGNOSIS — M47814 Spondylosis without myelopathy or radiculopathy, thoracic region: Secondary | ICD-10-CM | POA: Diagnosis not present

## 2015-09-04 DIAGNOSIS — C163 Malignant neoplasm of pyloric antrum: Secondary | ICD-10-CM | POA: Diagnosis not present

## 2015-09-04 LAB — CBC WITH DIFFERENTIAL/PLATELET
BASO%: 0.6 % (ref 0.0–2.0)
Basophils Absolute: 0 10*3/uL (ref 0.0–0.1)
EOS%: 12.3 % — AB (ref 0.0–7.0)
Eosinophils Absolute: 0.6 10*3/uL — ABNORMAL HIGH (ref 0.0–0.5)
HCT: 34.3 % — ABNORMAL LOW (ref 38.4–49.9)
HGB: 11.3 g/dL — ABNORMAL LOW (ref 13.0–17.1)
LYMPH%: 11.5 % — AB (ref 14.0–49.0)
MCH: 32.1 pg (ref 27.2–33.4)
MCHC: 32.9 g/dL (ref 32.0–36.0)
MCV: 97.4 fL (ref 79.3–98.0)
MONO#: 0.5 10*3/uL (ref 0.1–0.9)
MONO%: 9.6 % (ref 0.0–14.0)
NEUT%: 66 % (ref 39.0–75.0)
NEUTROS ABS: 3.4 10*3/uL (ref 1.5–6.5)
Platelets: 170 10*3/uL (ref 140–400)
RBC: 3.52 10*6/uL — AB (ref 4.20–5.82)
RDW: 14 % (ref 11.0–14.6)
WBC: 5.1 10*3/uL (ref 4.0–10.3)
lymph#: 0.6 10*3/uL — ABNORMAL LOW (ref 0.9–3.3)

## 2015-09-04 NOTE — Telephone Encounter (Signed)
Gave patient avs report and appointments for April 2017.

## 2015-09-04 NOTE — Progress Notes (Signed)
  [place] OFFICE PROGRESS NOTE   Diagnosis:  Gastric cancer  INTERVAL HISTORY:   Kyle Armstrong returns as scheduled. He reports shingles involving the left back and chest about 3 months ago. He intermittently notes itching in the area of the rash. He denies dysphagia. Appetite is fair. He has had back pain intermittently for the past 4-5 months. He is scheduled to have x-rays of the thoracic and lumbar spine today. He denies known injury. The pain does not radiate. No bowel or bladder dysfunction. Stable dyspnea on exertion. No cough. He continues to have a a poor energy level.  Objective:  Vital signs in last 24 hours:  Blood pressure 90/62, pulse 80, temperature 97.9 F (36.6 C), temperature source Oral, resp. rate 18, height $RemoveBe'5\' 8"'USNwiAvjy$  (1.727 m), weight 167 lb 4.8 oz (75.887 kg), SpO2 97 %.    HEENT: No thrush or ulcers. Lymphatics: No palpable cervical, supra clavicular, axillary or inguinal lymph nodes. Resp: Rales at both lung bases. Cardio: Regular rate and rhythm. GI: Abdomen soft and nontender. No hepatomegaly. No mass. Vascular: No leg edema. Neuro: Hard of hearing.  Skin: Flat rash consistent with previous herpes zoster left thoracic dermatome.    Lab Results:  Lab Results  Component Value Date   WBC 5.1 09/04/2015   HGB 11.3* 09/04/2015   HCT 34.3* 09/04/2015   MCV 97.4 09/04/2015   PLT 170 09/04/2015   NEUTROABS 3.4 09/04/2015    Imaging:  No results found.  Medications: I have reviewed the patient's current medications.  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 HER-2/neu amplified   Staging chest CT negative on 10/27/2013.   Initiation radiation and concurrent Xeloda 11/10/2013, completed 12/21/2013   Restaging CTs of the chest, abdomen, and pelvis on 01/25/2014 with persistent antral thickening, no evidence of  metastatic disease.   Distal gastrectomy with Billroth II anastomosis 02/15/2014 (y pT1b, y pN0). 2. Diffuse "gastritis "change noted on endoscopies 08/19/2013 and 10/06/2013 with biopsies confirming atrophic gastritis. Low and high-grade dysplasia were noted on the biopsy 08/19/2013  3. History of Anemia-likely secondary to GI blood loss, surgery, chemotherapy/radiation, and malnutrition 4. History of anorexia/weight loss.  5. History of colon polyp.  6. History of "sarcoidosis", pulmonary fibrosis noted on the chest CT 10/27/2013.  7. History of tobacco use.  8. History of arrhythmia, status post an ablation August 2013.     Disposition: Kyle Armstrong appears stable. He remains in clinical remission from gastric cancer. He will return for a follow-up visit in 6 months. He will contact the office in the interim with any problems.  He will follow-up with Dr. Harrington Challenger regarding the back pain. At this time low clinical suspicion that the pain is related to gastric cancer.  Plan reviewed with Dr. Benay Spice.  Ned Card ANP/GNP-BC   09/04/2015  9:33 AM

## 2015-09-20 DIAGNOSIS — D510 Vitamin B12 deficiency anemia due to intrinsic factor deficiency: Secondary | ICD-10-CM | POA: Diagnosis not present

## 2015-10-24 DIAGNOSIS — D510 Vitamin B12 deficiency anemia due to intrinsic factor deficiency: Secondary | ICD-10-CM | POA: Diagnosis not present

## 2015-10-24 IMAGING — CT CT CHEST W/ CM
2 of 3 series · 15 of 36 positions shown, 18 images · IV contrast (75CC OMNI 300)
Comparison: Abdominal pelvic CT 08/30/2013

CLINICAL DATA: History of gastric cancer diagnosed 6 weeks ago.
Evaluate for metastatic disease. Known history of sarcoidosis.

EXAM:
CT CHEST WITH CONTRAST
TECHNIQUE: Multidetector CT imaging of the chest was performed during
intravenous contrast administration.
CONTRAST:  75mL OMNIPAQUE IOHEXOL 300 MG/ML SOLN (labs drawn 315
with BUN 16 and creatinine 1.1)

[Series 2: chest with · axial · 0.86mm/px · z∈[-314,-34]mm · 12 of 66 slices shown, 15 images]
[im 5/66  mediastinal]
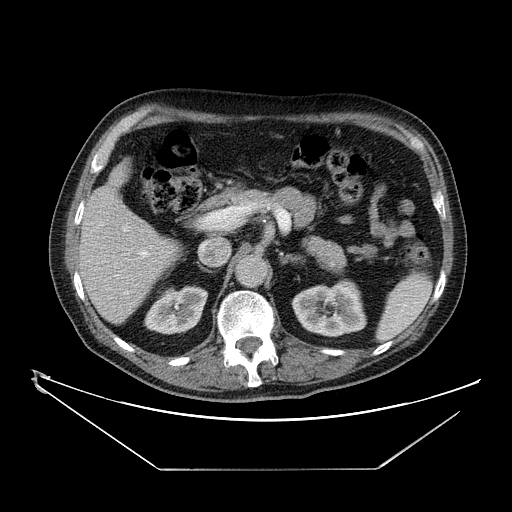
[im 5/66  lung]
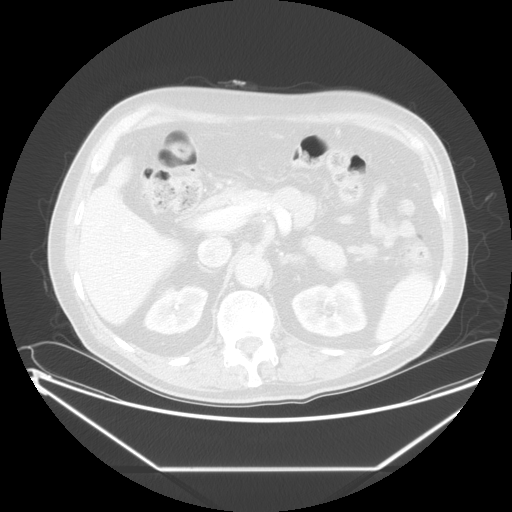
[im 10/66  lung]
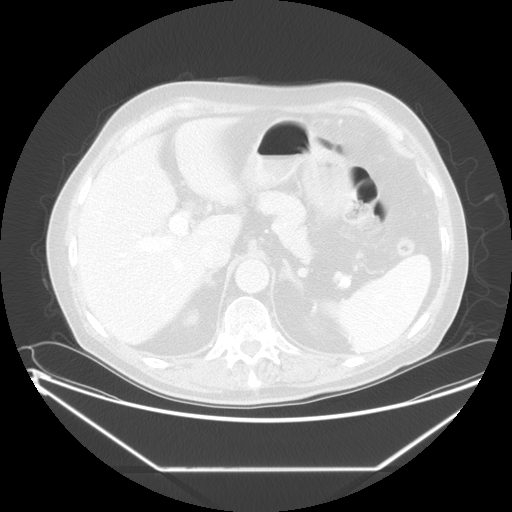
[im 15/66  lung]
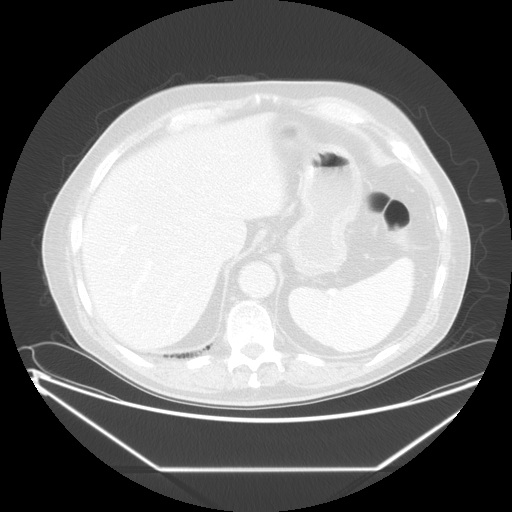
[im 20/66  lung]
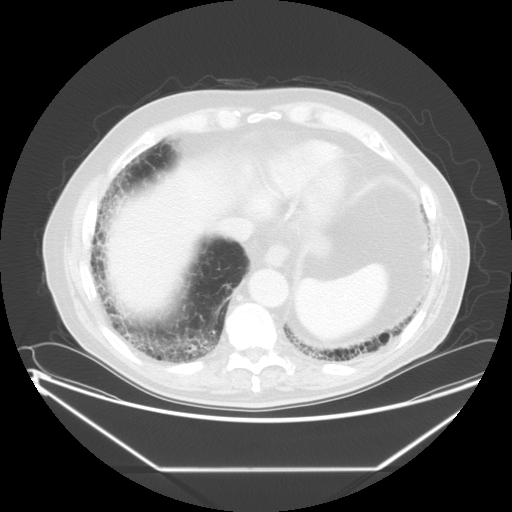
[im 25/66  mediastinal]
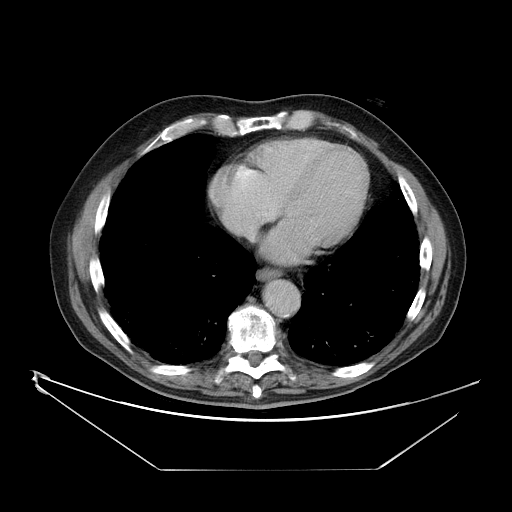
[im 25/66  lung]
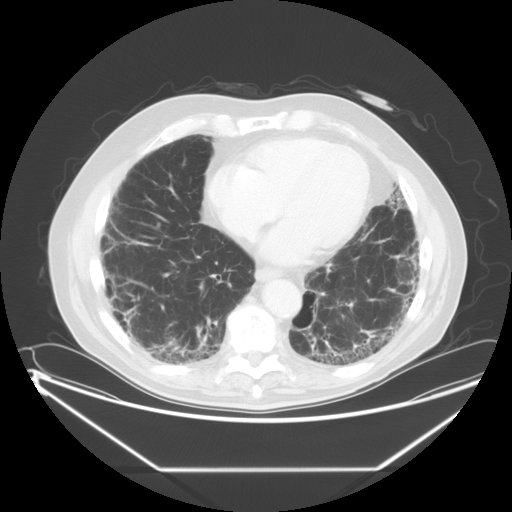
[im 29/66  lung]
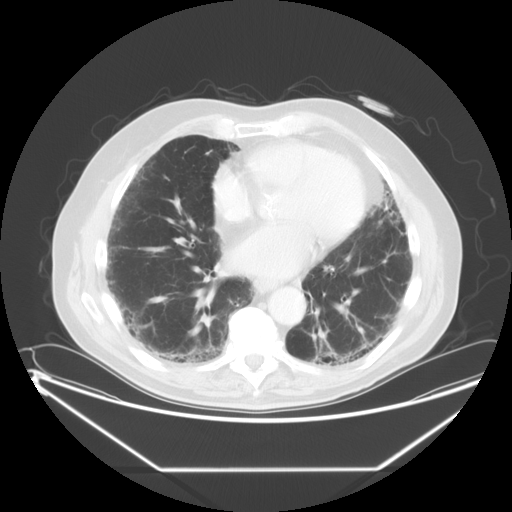
[im 37/66  lung]
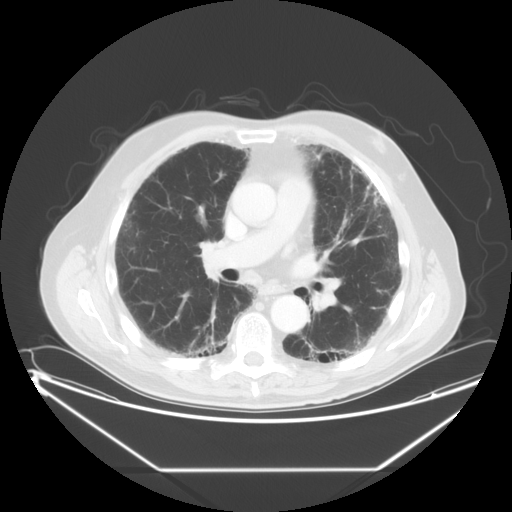
[im 41/66  lung]
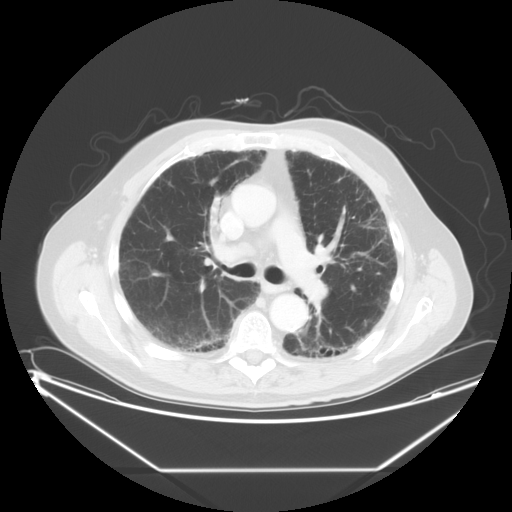
[im 46/66  mediastinal]
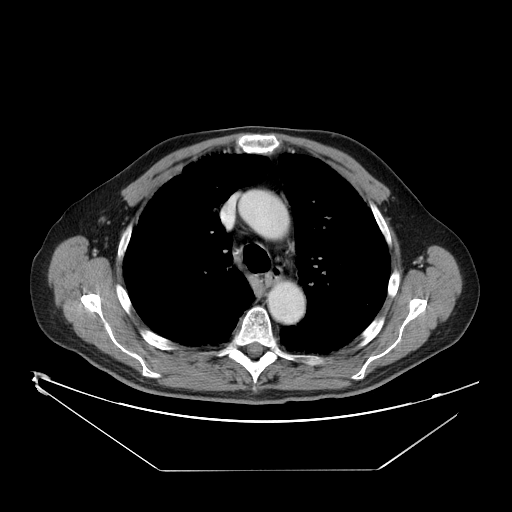
[im 46/66  lung]
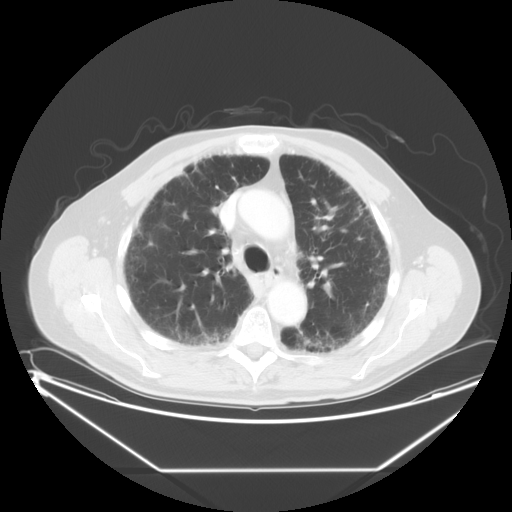
[im 51/66  lung]
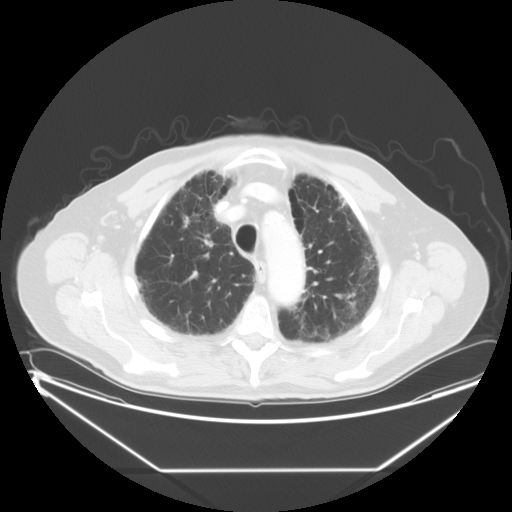
[im 56/66  lung]
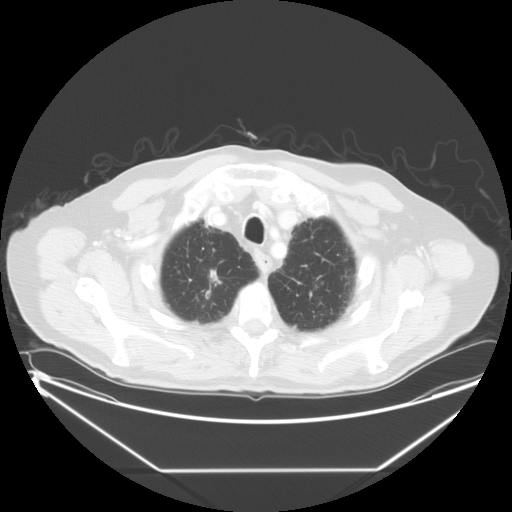
[im 61/66  lung]
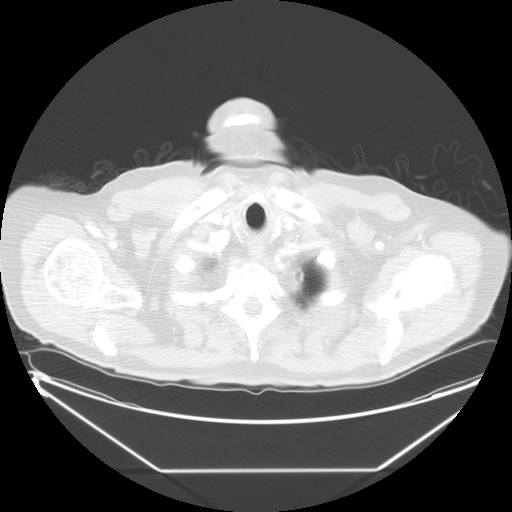

[Series 401: coronal · coronal · 0.86mm/px · 3 of 116 slices shown]
[im 24/116  lung]
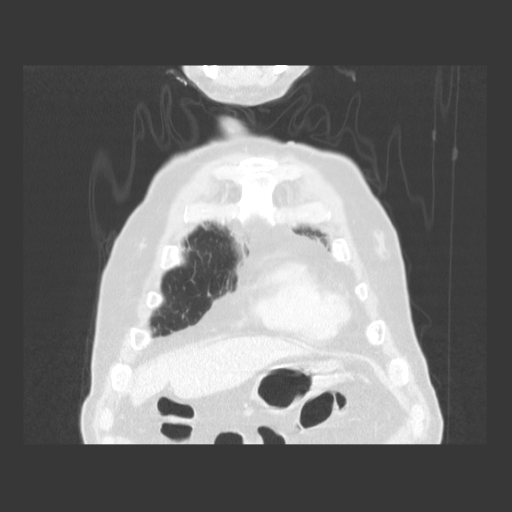
[im 47/116  lung]
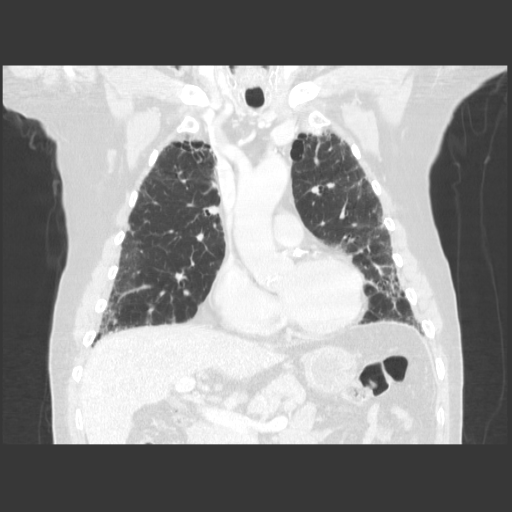
[im 70/116  lung]
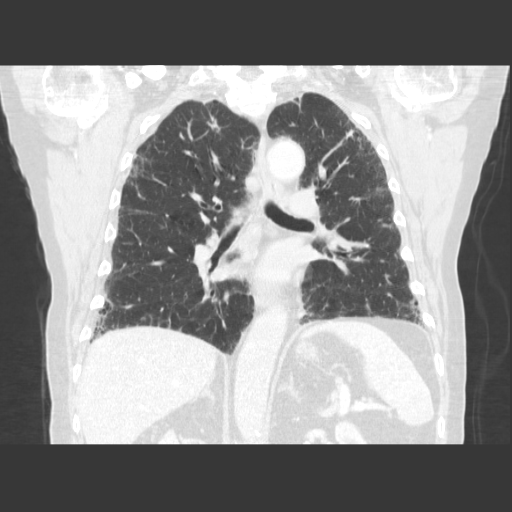

[15 of 36 positions shown; findings below may reference images not displayed]

FINDINGS: Lungs are adequately inflated with mild bilateral pulmonary
fibrosis. There is mild patchy peripheral nodular and linear density
in the apices likely scarring related to patient's fibrosis. There
is no focal consolidation or effusion. Heart is within normal. There
is calcification over the left anterior descending coronary artery.
There is a 1 cm subcarinal lymph node. There is a 9 mm and 1 cm
precarinal lymph node. There is no hilar or axillary adenopathy.
There is minimal calcification of the thoracic aorta.

Images through the upper abdomen there is an 8 mm hypodensity over
the spleen unchanged likely a cyst or hemangioma. There has been a
prior cholecystectomy. Remainder of the upper abdomen is unchanged.
There are degenerative changes of the spine.
IMPRESSION: Evidence of mild pulmonary fibrosis. Patchy peripheral nodularity
and linear density in the apices likely part of this fibrotic
process, although cannot completely exclude metastatic disease. A
couple borderline 1 cm mediastinal lymph nodes likely reactive.
Recommend followup as clinically indicated.

Atherosclerotic coronary artery disease.

8 mm hypodensity over the spleen unchanged likely a cyst or
hemangioma.

## 2015-11-13 DIAGNOSIS — M2010 Hallux valgus (acquired), unspecified foot: Secondary | ICD-10-CM | POA: Diagnosis not present

## 2015-11-13 DIAGNOSIS — M545 Low back pain: Secondary | ICD-10-CM | POA: Diagnosis not present

## 2015-11-13 DIAGNOSIS — M79673 Pain in unspecified foot: Secondary | ICD-10-CM | POA: Diagnosis not present

## 2015-11-13 DIAGNOSIS — H6090 Unspecified otitis externa, unspecified ear: Secondary | ICD-10-CM | POA: Diagnosis not present

## 2015-11-21 DIAGNOSIS — E538 Deficiency of other specified B group vitamins: Secondary | ICD-10-CM | POA: Diagnosis not present

## 2015-12-21 DIAGNOSIS — D510 Vitamin B12 deficiency anemia due to intrinsic factor deficiency: Secondary | ICD-10-CM | POA: Diagnosis not present

## 2016-01-18 DIAGNOSIS — E538 Deficiency of other specified B group vitamins: Secondary | ICD-10-CM | POA: Diagnosis not present

## 2016-01-28 DIAGNOSIS — R109 Unspecified abdominal pain: Secondary | ICD-10-CM | POA: Diagnosis not present

## 2016-01-28 DIAGNOSIS — R933 Abnormal findings on diagnostic imaging of other parts of digestive tract: Secondary | ICD-10-CM | POA: Diagnosis not present

## 2016-01-28 DIAGNOSIS — D509 Iron deficiency anemia, unspecified: Secondary | ICD-10-CM | POA: Diagnosis not present

## 2016-02-20 DIAGNOSIS — E538 Deficiency of other specified B group vitamins: Secondary | ICD-10-CM | POA: Diagnosis not present

## 2016-02-25 DIAGNOSIS — C163 Malignant neoplasm of pyloric antrum: Secondary | ICD-10-CM | POA: Diagnosis not present

## 2016-02-25 DIAGNOSIS — R1033 Periumbilical pain: Secondary | ICD-10-CM | POA: Diagnosis not present

## 2016-03-04 ENCOUNTER — Other Ambulatory Visit (HOSPITAL_BASED_OUTPATIENT_CLINIC_OR_DEPARTMENT_OTHER): Payer: Commercial Managed Care - HMO

## 2016-03-04 ENCOUNTER — Encounter: Payer: Self-pay | Admitting: Oncology

## 2016-03-04 ENCOUNTER — Telehealth: Payer: Self-pay | Admitting: Oncology

## 2016-03-04 ENCOUNTER — Ambulatory Visit (HOSPITAL_BASED_OUTPATIENT_CLINIC_OR_DEPARTMENT_OTHER): Payer: Commercial Managed Care - HMO | Admitting: Oncology

## 2016-03-04 VITALS — BP 120/69 | HR 76 | Temp 97.8°F | Resp 18 | Ht 68.0 in | Wt 171.7 lb

## 2016-03-04 DIAGNOSIS — D649 Anemia, unspecified: Secondary | ICD-10-CM

## 2016-03-04 DIAGNOSIS — C163 Malignant neoplasm of pyloric antrum: Secondary | ICD-10-CM

## 2016-03-04 DIAGNOSIS — Z8500 Personal history of malignant neoplasm of unspecified digestive organ: Secondary | ICD-10-CM | POA: Diagnosis not present

## 2016-03-04 LAB — CBC WITH DIFFERENTIAL/PLATELET
BASO%: 0.5 % (ref 0.0–2.0)
Basophils Absolute: 0 10*3/uL (ref 0.0–0.1)
EOS%: 12.5 % — ABNORMAL HIGH (ref 0.0–7.0)
Eosinophils Absolute: 0.8 10*3/uL — ABNORMAL HIGH (ref 0.0–0.5)
HEMATOCRIT: 30.1 % — AB (ref 38.4–49.9)
HEMOGLOBIN: 10.1 g/dL — AB (ref 13.0–17.1)
LYMPH#: 0.7 10*3/uL — AB (ref 0.9–3.3)
LYMPH%: 11.3 % — ABNORMAL LOW (ref 14.0–49.0)
MCH: 32.2 pg (ref 27.2–33.4)
MCHC: 33.6 g/dL (ref 32.0–36.0)
MCV: 95.9 fL (ref 79.3–98.0)
MONO#: 0.7 10*3/uL (ref 0.1–0.9)
MONO%: 11.1 % (ref 0.0–14.0)
NEUT%: 64.6 % (ref 39.0–75.0)
NEUTROS ABS: 4.1 10*3/uL (ref 1.5–6.5)
Platelets: 195 10*3/uL (ref 140–400)
RBC: 3.14 10*6/uL — ABNORMAL LOW (ref 4.20–5.82)
RDW: 14.2 % (ref 11.0–14.6)
WBC: 6.3 10*3/uL (ref 4.0–10.3)
nRBC: 0 % (ref 0–0)

## 2016-03-04 NOTE — Progress Notes (Signed)
  [facility] OFFICE PROGRESS NOTE   Diagnosis:  Gastric cancer  INTERVAL HISTORY:    [name] returns as scheduled. Good appetite. He is working 2 days per week. He has early satiety, no dysphagia. He complains of pain at the lower back that is worse with standing. The pain has been present for greater than 6 months. He takes Advil when he goes to work. He reports Dr. Barry Armstrong is scheduling an MRI. X-rays of the thoracic and lumbar spine in October 2016 revealed degenerative changes.  Objective:  Vital signs in last 24 hours:  Blood pressure 120/69, pulse 76, temperature 97.8 F (36.6 C), temperature source Oral, resp. rate 18, height '5\' 8"'$  (1.727 m), weight 171 lb 11.2 oz (77.883 kg), SpO2 98 %.    HEENT:  Neck without mass Lymphatics:  Cervical, supraclavicular, or axillary nodes Resp:  Rales at the lower posterior chest bilaterally, no respiratory distress Cardio:  Regular rate and rhythm GI:  No hepatomegaly, no mass, nontender Vascular:  The left lower leg is larger than the right side  musculoskeletal: Tender at the upper lumbar spine, no mass    Lab Results:  Lab Results  Component Value Date   WBC 6.3 03/04/2016   HGB 10.1* 03/04/2016   HCT 30.1* 03/04/2016   MCV 95.9 03/04/2016   PLT 195 03/04/2016   NEUTROABS 4.1 03/04/2016     Medications: I have reviewed the patient's current medications.  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 HER-2/neu amplified   Staging chest CT negative on 10/27/2013.   Initiation radiation and concurrent Xeloda 11/10/2013, completed 12/21/2013   Restaging CTs of the chest, abdomen, and pelvis on 01/25/2014 with persistent antral thickening, no evidence of metastatic disease.   Distal gastrectomy with Billroth II anastomosis 02/15/2014 (y pT1b, y pN0). 2. Diffuse "gastritis "change noted on  endoscopies 08/19/2013 and 10/06/2013 with biopsies confirming atrophic gastritis. Low and high-grade dysplasia were noted on the biopsy 08/19/2013  3. History of Anemia-likely secondary to GI blood loss, surgery, chemotherapy/radiation, and malnutrition 4. History of anorexia/weight loss.  5. History of colon polyp.  6. History of "sarcoidosis", pulmonary fibrosis noted on the chest CT 10/27/2013.  7. History of tobacco use.  8. History of arrhythmia, status post an ablation August 2013.  9.  Back pain-most likely secondary to a benign musculoskeletal condition     Disposition:   Kyle Armstrong remains in clinical remission from gastric cancer. He has persistent mild anemia. He will contact us for symptoms of anemia. The anemia is most likely related to his nutritional status following the distal gastrectomy.   He will follow-up with Dr. Barry Armstrong for evaluation of the back pain. He reports he will be scheduled for an MRI.  Kyle Armstrong will return for an office visit and CBC in 6 months. We will see him sooner as needed.  Betsy Coder, MD  03/04/2016  12:26 PM

## 2016-03-04 NOTE — Telephone Encounter (Signed)
Gave and printed appt sched and avs fo rpt for OCT °

## 2016-03-11 ENCOUNTER — Other Ambulatory Visit: Payer: Self-pay | Admitting: General Surgery

## 2016-03-11 DIAGNOSIS — C163 Malignant neoplasm of pyloric antrum: Secondary | ICD-10-CM

## 2016-03-14 ENCOUNTER — Ambulatory Visit
Admission: RE | Admit: 2016-03-14 | Discharge: 2016-03-14 | Disposition: A | Discharge status: Home / Self Care | Payer: Commercial Managed Care - HMO | Source: Ambulatory Visit | Attending: General Surgery | Admitting: General Surgery

## 2016-03-14 DIAGNOSIS — C163 Malignant neoplasm of pyloric antrum: Secondary | ICD-10-CM

## 2016-03-14 DIAGNOSIS — R109 Unspecified abdominal pain: Secondary | ICD-10-CM | POA: Diagnosis not present

## 2016-03-14 MED ORDER — IOPAMIDOL (ISOVUE-300) INJECTION 61%
100.0000 mL | Freq: Once | INTRAVENOUS | Status: AC | PRN
  Administered 2016-03-14: 100 mL via INTRAVENOUS

## 2016-03-17 NOTE — Progress Notes (Signed)
Quick Note:  Please let patient know CT looks good. If he still is having back pain, we can refer him to an ortho or spine person. ______

## 2016-04-02 DIAGNOSIS — M419 Scoliosis, unspecified: Secondary | ICD-10-CM | POA: Diagnosis not present

## 2016-04-02 DIAGNOSIS — M545 Low back pain: Secondary | ICD-10-CM | POA: Diagnosis not present

## 2016-04-02 DIAGNOSIS — I959 Hypotension, unspecified: Secondary | ICD-10-CM | POA: Diagnosis not present

## 2016-04-02 DIAGNOSIS — D510 Vitamin B12 deficiency anemia due to intrinsic factor deficiency: Secondary | ICD-10-CM | POA: Diagnosis not present

## 2016-04-08 DIAGNOSIS — M545 Low back pain: Secondary | ICD-10-CM | POA: Diagnosis not present

## 2016-04-08 DIAGNOSIS — M546 Pain in thoracic spine: Secondary | ICD-10-CM | POA: Diagnosis not present

## 2016-04-11 DIAGNOSIS — M47816 Spondylosis without myelopathy or radiculopathy, lumbar region: Secondary | ICD-10-CM | POA: Diagnosis not present

## 2016-04-28 DIAGNOSIS — D510 Vitamin B12 deficiency anemia due to intrinsic factor deficiency: Secondary | ICD-10-CM | POA: Diagnosis not present

## 2016-05-28 DIAGNOSIS — D510 Vitamin B12 deficiency anemia due to intrinsic factor deficiency: Secondary | ICD-10-CM | POA: Diagnosis not present

## 2016-06-09 DIAGNOSIS — M791 Myalgia: Secondary | ICD-10-CM | POA: Diagnosis not present

## 2016-06-09 DIAGNOSIS — M47816 Spondylosis without myelopathy or radiculopathy, lumbar region: Secondary | ICD-10-CM | POA: Diagnosis not present

## 2016-06-20 DIAGNOSIS — H35371 Puckering of macula, right eye: Secondary | ICD-10-CM | POA: Diagnosis not present

## 2016-06-20 DIAGNOSIS — H353131 Nonexudative age-related macular degeneration, bilateral, early dry stage: Secondary | ICD-10-CM | POA: Diagnosis not present

## 2016-06-20 DIAGNOSIS — H2511 Age-related nuclear cataract, right eye: Secondary | ICD-10-CM | POA: Diagnosis not present

## 2016-06-20 DIAGNOSIS — H52203 Unspecified astigmatism, bilateral: Secondary | ICD-10-CM | POA: Diagnosis not present

## 2016-06-25 DIAGNOSIS — M47816 Spondylosis without myelopathy or radiculopathy, lumbar region: Secondary | ICD-10-CM | POA: Diagnosis not present

## 2016-06-30 DIAGNOSIS — E538 Deficiency of other specified B group vitamins: Secondary | ICD-10-CM | POA: Diagnosis not present

## 2016-07-08 DIAGNOSIS — R079 Chest pain, unspecified: Secondary | ICD-10-CM | POA: Diagnosis not present

## 2016-07-08 DIAGNOSIS — M47816 Spondylosis without myelopathy or radiculopathy, lumbar region: Secondary | ICD-10-CM | POA: Diagnosis not present

## 2016-07-08 DIAGNOSIS — Z23 Encounter for immunization: Secondary | ICD-10-CM | POA: Diagnosis not present

## 2016-08-06 DIAGNOSIS — H9193 Unspecified hearing loss, bilateral: Secondary | ICD-10-CM | POA: Diagnosis not present

## 2016-08-06 DIAGNOSIS — N529 Male erectile dysfunction, unspecified: Secondary | ICD-10-CM | POA: Diagnosis not present

## 2016-08-06 DIAGNOSIS — Z0001 Encounter for general adult medical examination with abnormal findings: Secondary | ICD-10-CM | POA: Diagnosis not present

## 2016-08-06 DIAGNOSIS — E538 Deficiency of other specified B group vitamins: Secondary | ICD-10-CM | POA: Diagnosis not present

## 2016-08-06 DIAGNOSIS — E782 Mixed hyperlipidemia: Secondary | ICD-10-CM | POA: Diagnosis not present

## 2016-08-06 DIAGNOSIS — Z79899 Other long term (current) drug therapy: Secondary | ICD-10-CM | POA: Diagnosis not present

## 2016-08-06 DIAGNOSIS — D649 Anemia, unspecified: Secondary | ICD-10-CM | POA: Diagnosis not present

## 2016-08-06 DIAGNOSIS — E039 Hypothyroidism, unspecified: Secondary | ICD-10-CM | POA: Diagnosis not present

## 2016-09-03 ENCOUNTER — Ambulatory Visit (HOSPITAL_BASED_OUTPATIENT_CLINIC_OR_DEPARTMENT_OTHER): Payer: Commercial Managed Care - HMO | Admitting: Nurse Practitioner

## 2016-09-03 ENCOUNTER — Telehealth: Payer: Self-pay | Admitting: Oncology

## 2016-09-03 ENCOUNTER — Other Ambulatory Visit (HOSPITAL_BASED_OUTPATIENT_CLINIC_OR_DEPARTMENT_OTHER): Payer: Commercial Managed Care - HMO

## 2016-09-03 VITALS — BP 94/58 | HR 76 | Temp 98.1°F | Resp 18 | Ht 68.0 in | Wt 169.4 lb

## 2016-09-03 DIAGNOSIS — C163 Malignant neoplasm of pyloric antrum: Secondary | ICD-10-CM

## 2016-09-03 DIAGNOSIS — D649 Anemia, unspecified: Secondary | ICD-10-CM

## 2016-09-03 DIAGNOSIS — D510 Vitamin B12 deficiency anemia due to intrinsic factor deficiency: Secondary | ICD-10-CM | POA: Diagnosis not present

## 2016-09-03 LAB — CBC WITH DIFFERENTIAL/PLATELET
BASO%: 1 % (ref 0.0–2.0)
BASOS ABS: 0.1 10*3/uL (ref 0.0–0.1)
EOS%: 11.6 % — AB (ref 0.0–7.0)
Eosinophils Absolute: 0.6 10*3/uL — ABNORMAL HIGH (ref 0.0–0.5)
HEMATOCRIT: 31.6 % — AB (ref 38.4–49.9)
HGB: 10.5 g/dL — ABNORMAL LOW (ref 13.0–17.1)
LYMPH%: 12 % — ABNORMAL LOW (ref 14.0–49.0)
MCH: 31.8 pg (ref 27.2–33.4)
MCHC: 33.2 g/dL (ref 32.0–36.0)
MCV: 95.8 fL (ref 79.3–98.0)
MONO#: 0.5 10*3/uL (ref 0.1–0.9)
MONO%: 9.4 % (ref 0.0–14.0)
NEUT#: 3.3 10*3/uL (ref 1.5–6.5)
NEUT%: 66 % (ref 39.0–75.0)
Platelets: 167 10*3/uL (ref 140–400)
RBC: 3.3 10*6/uL — ABNORMAL LOW (ref 4.20–5.82)
RDW: 14.1 % (ref 11.0–14.6)
WBC: 5 10*3/uL (ref 4.0–10.3)
lymph#: 0.6 10*3/uL — ABNORMAL LOW (ref 0.9–3.3)

## 2016-09-03 NOTE — Progress Notes (Addendum)
  [place] OFFICE PROGRESS NOTE   Diagnosis:  Gastric cancer  INTERVAL HISTORY:   Mr. Lenis returns as scheduled. He overall feels well. He reports a good appetite. No dysphagia. He eats small frequent meals. He has stable chronic back pain.  Objective:  Vital signs in last 24 hours:  Blood pressure (!) 94/58, pulse 76, temperature 98.1 F (36.7 C), temperature source Oral, resp. rate 18, height _0  (1.727 m), weight 169 lb 6.4 oz (76.8 kg), SpO2 100 %.    HEENT: No neck mass. Lymphatics: No palpable cervical, supraclavicular or axillary lymph nodes. Resp: Lungs with rales at both lung bases. No respiratory distress. Cardio: Regular rate and rhythm. GI: Abdomen soft and nontender. No hepatomegaly. No mass. Vascular: No leg edema. The left lower leg is larger than the right lower leg.    Lab Results:  Lab Results  Component Value Date   WBC 5.0 09/03/2016   HGB 10.5 (L) 09/03/2016   HCT 31.6 (L) 09/03/2016   MCV 95.8 09/03/2016   PLT 167 09/03/2016   NEUTROABS 3.3 09/03/2016     Medications: I have reviewed the patient's current medications.  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 HER-2/neu amplified   Staging chest CT negative on 10/27/2013.   Initiation radiation and concurrent Xeloda 11/10/2013, completed 12/21/2013   Restaging CTs of the chest, abdomen, and pelvis on 01/25/2014 with persistent antral thickening, no evidence of metastatic disease.   Distal gastrectomy with Billroth II anastomosis 02/15/2014 (y pT1b, y pN0).  CT scans 03/14/2016 for evaluation of mid abdominal and back pain-no acute findings within the abdomen or pelvis. Postoperative changes identified compatible distal gastrectomy with gastrojejunostomy.  2. Diffuse "gastritis "change noted on endoscopies 08/19/2013 and 10/06/2013 with biopsies  confirming atrophic gastritis. Low and high-grade dysplasia were noted on the biopsy 08/19/2013  3. History of Anemia-likely secondary to GI blood loss, surgery, chemotherapy/radiation, and malnutrition 4. History of anorexia/weight loss.  5. History of colon polyp.  6. History of "sarcoidosis", pulmonary fibrosis noted on the chest CT 10/27/2013.  7. History of tobacco use.  8. History of arrhythmia, status post an ablation August 2013.  9. Back pain-most likely secondary to a benign musculoskeletal condition   Disposition: Mr. Corkins appears stable. He remains in clinical remission from gastric cancer.  He has persistent mild anemia. The anemia is most likely related to his nutritional status following the distal gastrectomy.  He will return for a follow-up visit and CBC in 8 months. He will contact the office in the interim with any problems.  Patient seen with Dr. Benay Spice.    Ned Card ANP/GNP-BC   09/03/2016  12:09 PM This was a shared visit with Ned Card. Mr. Seitzinger remains in clinical remission from gastric cancer. He will return for an office visit in 8 months.  Julieanne [name], M.D.

## 2016-09-03 NOTE — Telephone Encounter (Signed)
Avs report and appointment schedule given to patient, per 09/03/16 los.

## 2016-10-01 DIAGNOSIS — E538 Deficiency of other specified B group vitamins: Secondary | ICD-10-CM | POA: Diagnosis not present

## 2016-10-31 DIAGNOSIS — D649 Anemia, unspecified: Secondary | ICD-10-CM | POA: Diagnosis not present

## 2016-10-31 DIAGNOSIS — D510 Vitamin B12 deficiency anemia due to intrinsic factor deficiency: Secondary | ICD-10-CM | POA: Diagnosis not present

## 2016-10-31 DIAGNOSIS — C163 Malignant neoplasm of pyloric antrum: Secondary | ICD-10-CM | POA: Diagnosis not present

## 2016-11-11 DIAGNOSIS — M47816 Spondylosis without myelopathy or radiculopathy, lumbar region: Secondary | ICD-10-CM | POA: Diagnosis not present

## 2016-12-02 DIAGNOSIS — D510 Vitamin B12 deficiency anemia due to intrinsic factor deficiency: Secondary | ICD-10-CM | POA: Diagnosis not present

## 2016-12-02 DIAGNOSIS — M419 Scoliosis, unspecified: Secondary | ICD-10-CM | POA: Diagnosis not present

## 2016-12-02 DIAGNOSIS — M545 Low back pain: Secondary | ICD-10-CM | POA: Diagnosis not present

## 2017-01-06 DIAGNOSIS — E538 Deficiency of other specified B group vitamins: Secondary | ICD-10-CM | POA: Diagnosis not present

## 2017-02-03 DIAGNOSIS — E538 Deficiency of other specified B group vitamins: Secondary | ICD-10-CM | POA: Diagnosis not present

## 2017-02-10 DIAGNOSIS — M47816 Spondylosis without myelopathy or radiculopathy, lumbar region: Secondary | ICD-10-CM | POA: Diagnosis not present

## 2017-03-04 DIAGNOSIS — E538 Deficiency of other specified B group vitamins: Secondary | ICD-10-CM | POA: Diagnosis not present

## 2017-03-24 ENCOUNTER — Other Ambulatory Visit (HOSPITAL_BASED_OUTPATIENT_CLINIC_OR_DEPARTMENT_OTHER): Payer: Self-pay | Admitting: Family Medicine

## 2017-03-24 ENCOUNTER — Ambulatory Visit (HOSPITAL_BASED_OUTPATIENT_CLINIC_OR_DEPARTMENT_OTHER)
Admission: RE | Admit: 2017-03-24 | Discharge: 2017-03-24 | Disposition: A | Discharge status: Home / Self Care | Payer: Medicare HMO | Source: Ambulatory Visit | Attending: Family Medicine | Admitting: Family Medicine

## 2017-03-24 DIAGNOSIS — M7989 Other specified soft tissue disorders: Secondary | ICD-10-CM | POA: Diagnosis not present

## 2017-03-24 DIAGNOSIS — R351 Nocturia: Secondary | ICD-10-CM | POA: Diagnosis not present

## 2017-03-24 DIAGNOSIS — L03116 Cellulitis of left lower limb: Secondary | ICD-10-CM | POA: Diagnosis not present

## 2017-04-01 DIAGNOSIS — M549 Dorsalgia, unspecified: Secondary | ICD-10-CM | POA: Diagnosis not present

## 2017-04-01 DIAGNOSIS — B353 Tinea pedis: Secondary | ICD-10-CM | POA: Diagnosis not present

## 2017-04-01 DIAGNOSIS — L03116 Cellulitis of left lower limb: Secondary | ICD-10-CM | POA: Diagnosis not present

## 2017-04-01 DIAGNOSIS — E538 Deficiency of other specified B group vitamins: Secondary | ICD-10-CM | POA: Diagnosis not present

## 2017-04-01 DIAGNOSIS — R600 Localized edema: Secondary | ICD-10-CM | POA: Diagnosis not present

## 2017-04-28 DIAGNOSIS — M47816 Spondylosis without myelopathy or radiculopathy, lumbar region: Secondary | ICD-10-CM | POA: Diagnosis not present

## 2017-05-05 ENCOUNTER — Other Ambulatory Visit (HOSPITAL_BASED_OUTPATIENT_CLINIC_OR_DEPARTMENT_OTHER): Payer: Medicare HMO

## 2017-05-05 ENCOUNTER — Telehealth: Payer: Self-pay | Admitting: Oncology

## 2017-05-05 ENCOUNTER — Ambulatory Visit (HOSPITAL_BASED_OUTPATIENT_CLINIC_OR_DEPARTMENT_OTHER): Payer: Medicare HMO | Admitting: Oncology

## 2017-05-05 VITALS — BP 108/69 | HR 93 | Temp 97.3°F | Resp 18 | Ht 68.0 in | Wt 163.9 lb

## 2017-05-05 DIAGNOSIS — M1990 Unspecified osteoarthritis, unspecified site: Secondary | ICD-10-CM

## 2017-05-05 DIAGNOSIS — C163 Malignant neoplasm of pyloric antrum: Secondary | ICD-10-CM | POA: Diagnosis not present

## 2017-05-05 DIAGNOSIS — D510 Vitamin B12 deficiency anemia due to intrinsic factor deficiency: Secondary | ICD-10-CM

## 2017-05-05 DIAGNOSIS — E538 Deficiency of other specified B group vitamins: Secondary | ICD-10-CM | POA: Diagnosis not present

## 2017-05-05 LAB — CBC WITH DIFFERENTIAL/PLATELET
BASO%: 1.1 % (ref 0.0–2.0)
Basophils Absolute: 0.1 10*3/uL (ref 0.0–0.1)
EOS%: 8.8 % — ABNORMAL HIGH (ref 0.0–7.0)
Eosinophils Absolute: 0.5 10*3/uL (ref 0.0–0.5)
HCT: 32.7 % — ABNORMAL LOW (ref 38.4–49.9)
HGB: 11 g/dL — ABNORMAL LOW (ref 13.0–17.1)
LYMPH%: 8.5 % — ABNORMAL LOW (ref 14.0–49.0)
MCH: 32.6 pg (ref 27.2–33.4)
MCHC: 33.7 g/dL (ref 32.0–36.0)
MCV: 96.8 fL (ref 79.3–98.0)
MONO#: 0.7 10*3/uL (ref 0.1–0.9)
MONO%: 11 % (ref 0.0–14.0)
NEUT#: 4.3 10*3/uL (ref 1.5–6.5)
NEUT%: 70.6 % (ref 39.0–75.0)
Platelets: 215 10*3/uL (ref 140–400)
RBC: 3.38 10*6/uL — ABNORMAL LOW (ref 4.20–5.82)
RDW: 14.9 % — ABNORMAL HIGH (ref 11.0–14.6)
WBC: 6.1 10*3/uL (ref 4.0–10.3)
lymph#: 0.5 10*3/uL — ABNORMAL LOW (ref 0.9–3.3)

## 2017-05-05 NOTE — Telephone Encounter (Signed)
Scheduled appt per 6/12 los - Gave patient AVS and calender.

## 2017-05-05 NOTE — Progress Notes (Signed)
  [facility] OFFICE PROGRESS NOTE   Diagnosis: Gastric cancer  INTERVAL HISTORY:   Kyle Armstrong returns as scheduled. He complains of chronic "arthritis "pain in the back. He receives a "injection "every 3 months. Good appetite. He is working 3 days per week. He relates weight loss to discontinuing ice cream at night.  Objective:  Vital signs in last 24 hours:  Blood pressure 108/69, pulse 93, temperature 97.3 F (36.3 C), temperature source Oral, resp. rate 18, height '5\' 8"'$  (1.727 m), weight 163 lb 14.4 oz (74.3 kg), SpO2 99 %.    HEENT: Neck without mass Lymphatics: No cervical, supraclavicular, axillary, or inguinal nodes Resp: Diffuse inspiratory rales, no respiratory distress Cardio: Regular rhythm with occasional pulse GI: No hepatosplenomegaly, no mass, nontender Vascular: 1+ pitting edema at the left greater than right lower leg   Lab Results:  Lab Results  Component Value Date   WBC 6.1 05/05/2017   HGB 11.0 (L) 05/05/2017   HCT 32.7 (L) 05/05/2017   MCV 96.8 05/05/2017   PLT 215 05/05/2017   NEUTROABS 4.3 05/05/2017     Medications: I have reviewed the patient's current medications.  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 HER-2/neu amplified   Staging chest CT negative on 10/27/2013.   Initiation radiation and concurrent Xeloda 11/10/2013, completed 12/21/2013   Restaging CTs of the chest, abdomen, and pelvis on 01/25/2014 with persistent antral thickening, no evidence of metastatic disease.   Distal gastrectomy with Billroth II anastomosis 02/15/2014 (y pT1b, y pN0).  CT scans 03/14/2016 for evaluation of mid abdominal and back pain-no acute findings within the abdomen or pelvis. Postoperative changes identified compatible distal gastrectomy with gastrojejunostomy.  2. Diffuse "gastritis "change noted on  endoscopies 08/19/2013 and 10/06/2013 with biopsies confirming atrophic gastritis. Low and high-grade dysplasia were noted on the biopsy 08/19/2013  3. History of Anemia-likely secondary to GI blood loss, surgery, chemotherapy/radiation, and malnutrition 4. History of anorexia/weight loss.  5. History of colon polyp.  6. History of "sarcoidosis", pulmonary fibrosis noted on the chest CT 10/27/2013.  7. History of tobacco use.  8. History of arrhythmia, status post an ablation August 2013.  9. Chronic Back pain-most likely secondary to a benign musculoskeletal condition    Disposition:  Kyle Armstrong remains in clinical remission from gastric cancer. The hemoglobin is stable. He will return for an office visit in 8 months. He continues follow-up with Dr. Cher Nakai.  15 minutes were spent with the patient today. The majority of the time was used for counseling and coordination of care.  Donneta Romberg, MD  05/05/2017  12:22 PM

## 2017-06-03 DIAGNOSIS — D510 Vitamin B12 deficiency anemia due to intrinsic factor deficiency: Secondary | ICD-10-CM | POA: Diagnosis not present

## 2017-06-21 ENCOUNTER — Other Ambulatory Visit: Payer: Self-pay

## 2017-06-21 ENCOUNTER — Encounter (HOSPITAL_COMMUNITY): Payer: Self-pay | Admitting: Emergency Medicine

## 2017-06-21 ENCOUNTER — Emergency Department (HOSPITAL_COMMUNITY): Payer: Medicare HMO

## 2017-06-21 ENCOUNTER — Inpatient Hospital Stay (HOSPITAL_COMMUNITY)
Admission: EM | Admit: 2017-06-21 | Discharge: 2017-06-24 | DRG: 291 | Disposition: A | Discharge status: Home Health | Payer: Medicare HMO | Attending: Internal Medicine | Admitting: Internal Medicine

## 2017-06-21 ENCOUNTER — Inpatient Hospital Stay (HOSPITAL_COMMUNITY): Payer: Medicare HMO

## 2017-06-21 DIAGNOSIS — Z8249 Family history of ischemic heart disease and other diseases of the circulatory system: Secondary | ICD-10-CM | POA: Diagnosis not present

## 2017-06-21 DIAGNOSIS — Z87891 Personal history of nicotine dependence: Secondary | ICD-10-CM

## 2017-06-21 DIAGNOSIS — N400 Enlarged prostate without lower urinary tract symptoms: Secondary | ICD-10-CM | POA: Diagnosis present

## 2017-06-21 DIAGNOSIS — G8929 Other chronic pain: Secondary | ICD-10-CM | POA: Diagnosis present

## 2017-06-21 DIAGNOSIS — E876 Hypokalemia: Secondary | ICD-10-CM | POA: Diagnosis not present

## 2017-06-21 DIAGNOSIS — J90 Pleural effusion, not elsewhere classified: Secondary | ICD-10-CM | POA: Diagnosis not present

## 2017-06-21 DIAGNOSIS — E890 Postprocedural hypothyroidism: Secondary | ICD-10-CM | POA: Diagnosis not present

## 2017-06-21 DIAGNOSIS — J209 Acute bronchitis, unspecified: Secondary | ICD-10-CM | POA: Diagnosis not present

## 2017-06-21 DIAGNOSIS — I509 Heart failure, unspecified: Secondary | ICD-10-CM | POA: Diagnosis not present

## 2017-06-21 DIAGNOSIS — Z79899 Other long term (current) drug therapy: Secondary | ICD-10-CM | POA: Diagnosis not present

## 2017-06-21 DIAGNOSIS — I340 Nonrheumatic mitral (valve) insufficiency: Secondary | ICD-10-CM | POA: Diagnosis present

## 2017-06-21 DIAGNOSIS — J9601 Acute respiratory failure with hypoxia: Secondary | ICD-10-CM | POA: Diagnosis not present

## 2017-06-21 DIAGNOSIS — D510 Vitamin B12 deficiency anemia due to intrinsic factor deficiency: Secondary | ICD-10-CM | POA: Diagnosis not present

## 2017-06-21 DIAGNOSIS — Z888 Allergy status to other drugs, medicaments and biological substances status: Secondary | ICD-10-CM

## 2017-06-21 DIAGNOSIS — I350 Nonrheumatic aortic (valve) stenosis: Secondary | ICD-10-CM | POA: Diagnosis not present

## 2017-06-21 DIAGNOSIS — Z923 Personal history of irradiation: Secondary | ICD-10-CM

## 2017-06-21 DIAGNOSIS — E785 Hyperlipidemia, unspecified: Secondary | ICD-10-CM | POA: Diagnosis not present

## 2017-06-21 DIAGNOSIS — E871 Hypo-osmolality and hyponatremia: Secondary | ICD-10-CM | POA: Diagnosis not present

## 2017-06-21 DIAGNOSIS — Z85028 Personal history of other malignant neoplasm of stomach: Secondary | ICD-10-CM | POA: Diagnosis not present

## 2017-06-21 DIAGNOSIS — Z9049 Acquired absence of other specified parts of digestive tract: Secondary | ICD-10-CM | POA: Diagnosis not present

## 2017-06-21 DIAGNOSIS — E784 Other hyperlipidemia: Secondary | ICD-10-CM | POA: Diagnosis not present

## 2017-06-21 DIAGNOSIS — I5033 Acute on chronic diastolic (congestive) heart failure: Secondary | ICD-10-CM | POA: Diagnosis not present

## 2017-06-21 DIAGNOSIS — I5032 Chronic diastolic (congestive) heart failure: Secondary | ICD-10-CM

## 2017-06-21 DIAGNOSIS — C163 Malignant neoplasm of pyloric antrum: Secondary | ICD-10-CM | POA: Diagnosis present

## 2017-06-21 DIAGNOSIS — Z934 Other artificial openings of gastrointestinal tract status: Secondary | ICD-10-CM

## 2017-06-21 DIAGNOSIS — M545 Low back pain: Secondary | ICD-10-CM | POA: Diagnosis not present

## 2017-06-21 DIAGNOSIS — Z881 Allergy status to other antibiotic agents status: Secondary | ICD-10-CM

## 2017-06-21 DIAGNOSIS — E43 Unspecified severe protein-calorie malnutrition: Secondary | ICD-10-CM

## 2017-06-21 DIAGNOSIS — I4891 Unspecified atrial fibrillation: Secondary | ICD-10-CM | POA: Diagnosis present

## 2017-06-21 DIAGNOSIS — K219 Gastro-esophageal reflux disease without esophagitis: Secondary | ICD-10-CM | POA: Diagnosis present

## 2017-06-21 DIAGNOSIS — E039 Hypothyroidism, unspecified: Secondary | ICD-10-CM | POA: Diagnosis not present

## 2017-06-21 DIAGNOSIS — D860 Sarcoidosis of lung: Secondary | ICD-10-CM | POA: Diagnosis not present

## 2017-06-21 DIAGNOSIS — Z9221 Personal history of antineoplastic chemotherapy: Secondary | ICD-10-CM

## 2017-06-21 LAB — BASIC METABOLIC PANEL
ANION GAP: 12 (ref 5–15)
ANION GAP: 9 (ref 5–15)
Anion gap: 10 (ref 5–15)
BUN: 12 mg/dL (ref 6–20)
BUN: 12 mg/dL (ref 6–20)
BUN: 12 mg/dL (ref 6–20)
CHLORIDE: 89 mmol/L — AB (ref 101–111)
CHLORIDE: 89 mmol/L — AB (ref 101–111)
CHLORIDE: 87 mmol/L — AB (ref 101–111)
CO2: 23 mmol/L (ref 22–32)
CO2: 25 mmol/L (ref 22–32)
CO2: 26 mmol/L (ref 22–32)
CREATININE: 0.92 mg/dL (ref 0.61–1.24)
Calcium: 8.6 mg/dL — ABNORMAL LOW (ref 8.9–10.3)
Calcium: 8.7 mg/dL — ABNORMAL LOW (ref 8.9–10.3)
Calcium: 8.9 mg/dL (ref 8.9–10.3)
Creatinine, Ser: 0.86 mg/dL (ref 0.61–1.24)
Creatinine, Ser: 0.94 mg/dL (ref 0.61–1.24)
Glucose, Bld: 223 mg/dL — ABNORMAL HIGH (ref 65–99)
Glucose, Bld: 200 mg/dL — ABNORMAL HIGH (ref 65–99)
Glucose, Bld: 171 mg/dL — ABNORMAL HIGH (ref 65–99)
POTASSIUM: 3.6 mmol/L (ref 3.5–5.1)
POTASSIUM: 3.8 mmol/L (ref 3.5–5.1)
POTASSIUM: 3.5 mmol/L (ref 3.5–5.1)
SODIUM: 124 mmol/L — AB (ref 135–145)
SODIUM: 123 mmol/L — AB (ref 135–145)
SODIUM: 123 mmol/L — AB (ref 135–145)

## 2017-06-21 LAB — RESPIRATORY PANEL BY PCR
ADENOVIRUS-RVPPCR: NOT DETECTED
BORDETELLA PERTUSSIS-RVPCR: NOT DETECTED
CHLAMYDOPHILA PNEUMONIAE-RVPPCR: NOT DETECTED
CORONAVIRUS 229E-RVPPCR: NOT DETECTED
CORONAVIRUS HKU1-RVPPCR: NOT DETECTED
CORONAVIRUS NL63-RVPPCR: NOT DETECTED
Coronavirus OC43: NOT DETECTED
Influenza A: NOT DETECTED
Influenza B: NOT DETECTED
Metapneumovirus: NOT DETECTED
Mycoplasma pneumoniae: NOT DETECTED
Parainfluenza Virus 1: NOT DETECTED
Parainfluenza Virus 2: NOT DETECTED
Parainfluenza Virus 3: NOT DETECTED
Parainfluenza Virus 4: NOT DETECTED
Respiratory Syncytial Virus: NOT DETECTED
Rhinovirus / Enterovirus: NOT DETECTED

## 2017-06-21 LAB — CBC WITH DIFFERENTIAL/PLATELET
BASOS PCT: 0 %
Basophils Absolute: 0 10*3/uL (ref 0.0–0.1)
Eosinophils Absolute: 0.3 10*3/uL (ref 0.0–0.7)
Eosinophils Relative: 6 %
HEMATOCRIT: 29.1 % — AB (ref 39.0–52.0)
HEMOGLOBIN: 9.8 g/dL — AB (ref 13.0–17.0)
LYMPHS ABS: 0.5 10*3/uL — AB (ref 0.7–4.0)
LYMPHS PCT: 10 %
MCH: 31 pg (ref 26.0–34.0)
MCHC: 33.7 g/dL (ref 30.0–36.0)
MCV: 92.1 fL (ref 78.0–100.0)
MONOS PCT: 13 %
Monocytes Absolute: 0.7 10*3/uL (ref 0.1–1.0)
NEUTROS ABS: 3.7 10*3/uL (ref 1.7–7.7)
NEUTROS PCT: 71 %
Platelets: 200 10*3/uL (ref 150–400)
RBC: 3.16 MIL/uL — ABNORMAL LOW (ref 4.22–5.81)
RDW: 13.9 % (ref 11.5–15.5)
WBC: 5.2 10*3/uL (ref 4.0–10.5)

## 2017-06-21 LAB — COMPREHENSIVE METABOLIC PANEL
ALBUMIN: 3.3 g/dL — AB (ref 3.5–5.0)
ALT: 28 U/L (ref 17–63)
AST: 18 U/L (ref 15–41)
Alkaline Phosphatase: 82 U/L (ref 38–126)
Anion gap: 5 (ref 5–15)
BILIRUBIN TOTAL: 0.7 mg/dL (ref 0.3–1.2)
BUN: 15 mg/dL (ref 6–20)
CHLORIDE: 91 mmol/L — AB (ref 101–111)
CO2: 26 mmol/L (ref 22–32)
Calcium: 8.5 mg/dL — ABNORMAL LOW (ref 8.9–10.3)
Creatinine, Ser: 0.8 mg/dL (ref 0.61–1.24)
GFR calc Af Amer: >60 mL/min (ref >60)
GFR calc non Af Amer: >60 mL/min (ref >60)
GLUCOSE: 107 mg/dL — AB (ref 65–99)
POTASSIUM: 3.8 mmol/L (ref 3.5–5.1)
SODIUM: 122 mmol/L — AB (ref 135–145)
TOTAL PROTEIN: 5.3 g/dL — AB (ref 6.5–8.1)

## 2017-06-21 LAB — TROPONIN I
Troponin I: <0.03 ng/mL (ref <0.03)
Troponin I: <0.03 ng/mL (ref <0.03)

## 2017-06-21 LAB — BRAIN NATRIURETIC PEPTIDE: B NATRIURETIC PEPTIDE 5: 843.8 pg/mL — AB (ref 0.0–100.0)

## 2017-06-21 LAB — I-STAT TROPONIN, ED: Troponin i, poc: 0.02 ng/mL (ref 0.00–0.08)

## 2017-06-21 LAB — TSH: TSH: 1.416 u[IU]/mL (ref 0.350–4.500)

## 2017-06-21 LAB — ECHOCARDIOGRAM COMPLETE
HEIGHTINCHES: 68 in
Weight: 2819.2 oz

## 2017-06-21 LAB — STREP PNEUMONIAE URINARY ANTIGEN: STREP PNEUMO URINARY ANTIGEN: NEGATIVE

## 2017-06-21 LAB — MRSA PCR SCREENING: MRSA BY PCR: NEGATIVE

## 2017-06-21 LAB — OSMOLALITY: Osmolality: 266 mOsm/kg — ABNORMAL LOW (ref 275–295)

## 2017-06-21 LAB — OSMOLALITY, URINE: OSMOLALITY UR: 307 mosm/kg (ref 300–900)

## 2017-06-21 LAB — LACTIC ACID, PLASMA: LACTIC ACID, VENOUS: 3.6 mmol/L — AB (ref 0.5–1.9)

## 2017-06-21 MED ORDER — IPRATROPIUM-ALBUTEROL 0.5-2.5 (3) MG/3ML IN SOLN
3.0000 mL | RESPIRATORY_TRACT | Status: DC
  Administered 2017-06-21 (×2): 3 mL via RESPIRATORY_TRACT
  Filled 2017-06-21 (×2): qty 3

## 2017-06-21 MED ORDER — SODIUM CHLORIDE 0.9 % IV SOLN: 250.0000 mL | INTRAVENOUS | Status: DC | PRN

## 2017-06-21 MED ORDER — ALBUTEROL SULFATE (2.5 MG/3ML) 0.083% IN NEBU
5.0000 mg | INHALATION_SOLUTION | Freq: Once | RESPIRATORY_TRACT | Status: AC
  Administered 2017-06-21: 5 mg via RESPIRATORY_TRACT
  Filled 2017-06-21: qty 6

## 2017-06-21 MED ORDER — IPRATROPIUM-ALBUTEROL 0.5-2.5 (3) MG/3ML IN SOLN
3.0000 mL | Freq: Four times a day (QID) | RESPIRATORY_TRACT | Status: DC
  Administered 2017-06-21: 3 mL via RESPIRATORY_TRACT
  Filled 2017-06-21: qty 3

## 2017-06-21 MED ORDER — FUROSEMIDE 10 MG/ML IJ SOLN
60.0000 mg | Freq: Three times a day (TID) | INTRAMUSCULAR | Status: DC
  Administered 2017-06-21 – 2017-06-23 (×8): 60 mg via INTRAVENOUS
  Filled 2017-06-21 (×8): qty 6

## 2017-06-21 MED ORDER — SODIUM CHLORIDE 0.9% FLUSH
3.0000 mL | Freq: Two times a day (BID) | INTRAVENOUS | Status: DC
  Administered 2017-06-21 – 2017-06-22 (×3): 3 mL via INTRAVENOUS

## 2017-06-21 MED ORDER — PANTOPRAZOLE SODIUM 40 MG PO TBEC: 40.0000 mg | DELAYED_RELEASE_TABLET | Freq: Two times a day (BID) | ORAL | Status: DC | PRN

## 2017-06-21 MED ORDER — LEVOTHYROXINE SODIUM 50 MCG PO TABS
50.0000 ug | ORAL_TABLET | Freq: Every day | ORAL | Status: DC
  Administered 2017-06-22 – 2017-06-24 (×3): 50 ug via ORAL
  Filled 2017-06-21 (×3): qty 1

## 2017-06-21 MED ORDER — SIMVASTATIN 40 MG PO TABS
40.0000 mg | ORAL_TABLET | Freq: Every evening | ORAL | Status: DC
  Administered 2017-06-21 – 2017-06-24 (×4): 40 mg via ORAL
  Filled 2017-06-21 (×4): qty 1

## 2017-06-21 MED ORDER — METOPROLOL TARTRATE 12.5 MG HALF TABLET
12.5000 mg | ORAL_TABLET | Freq: Two times a day (BID) | ORAL | Status: DC
  Administered 2017-06-21 – 2017-06-23 (×5): 12.5 mg via ORAL
  Filled 2017-06-21 (×6): qty 1

## 2017-06-21 MED ORDER — ALBUTEROL SULFATE (2.5 MG/3ML) 0.083% IN NEBU: 2.5000 mg | INHALATION_SOLUTION | RESPIRATORY_TRACT | Status: DC | PRN

## 2017-06-21 MED ORDER — SODIUM CHLORIDE 0.9% FLUSH: 3.0000 mL | INTRAVENOUS | Status: DC | PRN

## 2017-06-21 MED ORDER — ASPIRIN EC 81 MG PO TBEC
81.0000 mg | DELAYED_RELEASE_TABLET | Freq: Every day | ORAL | Status: DC
  Administered 2017-06-21 – 2017-06-24 (×4): 81 mg via ORAL
  Filled 2017-06-21 (×4): qty 1

## 2017-06-21 MED ORDER — METOPROLOL TARTRATE 5 MG/5ML IV SOLN
5.0000 mg | Freq: Once | INTRAVENOUS | Status: AC
  Administered 2017-06-21: 5 mg via INTRAVENOUS
  Filled 2017-06-21: qty 5

## 2017-06-21 MED ORDER — ZOLPIDEM TARTRATE 5 MG PO TABS
5.0000 mg | ORAL_TABLET | Freq: Every evening | ORAL | Status: DC | PRN
  Administered 2017-06-21 – 2017-06-22 (×2): 5 mg via ORAL
  Filled 2017-06-21 (×2): qty 1

## 2017-06-21 MED ORDER — TAMSULOSIN HCL 0.4 MG PO CAPS
0.4000 mg | ORAL_CAPSULE | Freq: Every day | ORAL | Status: DC
  Administered 2017-06-21 – 2017-06-24 (×4): 0.4 mg via ORAL
  Filled 2017-06-21 (×4): qty 1

## 2017-06-21 MED ORDER — ACETAMINOPHEN 325 MG PO TABS: 650.0000 mg | ORAL_TABLET | Freq: Four times a day (QID) | ORAL | Status: DC | PRN

## 2017-06-21 MED ORDER — CELECOXIB 200 MG PO CAPS
200.0000 mg | ORAL_CAPSULE | Freq: Two times a day (BID) | ORAL | Status: DC
  Administered 2017-06-21 – 2017-06-24 (×7): 200 mg via ORAL
  Filled 2017-06-21 (×8): qty 1

## 2017-06-21 MED ORDER — DM-GUAIFENESIN ER 30-600 MG PO TB12
1.0000 | ORAL_TABLET | Freq: Two times a day (BID) | ORAL | Status: DC
  Administered 2017-06-21 – 2017-06-22 (×3): 1 via ORAL
  Filled 2017-06-21 (×3): qty 1

## 2017-06-21 MED ORDER — ENOXAPARIN SODIUM 40 MG/0.4ML ~~LOC~~ SOLN
40.0000 mg | SUBCUTANEOUS | Status: DC
  Administered 2017-06-21 – 2017-06-24 (×4): 40 mg via SUBCUTANEOUS
  Filled 2017-06-21 (×4): qty 0.4

## 2017-06-21 MED ORDER — FAMOTIDINE 20 MG PO TABS
20.0000 mg | ORAL_TABLET | Freq: Every day | ORAL | Status: DC
  Administered 2017-06-21 – 2017-06-22 (×2): 20 mg via ORAL
  Filled 2017-06-21 (×2): qty 1

## 2017-06-21 MED ORDER — METHYLPREDNISOLONE SODIUM SUCC 125 MG IJ SOLR
60.0000 mg | Freq: Three times a day (TID) | INTRAMUSCULAR | Status: DC
  Administered 2017-06-21 (×2): 60 mg via INTRAVENOUS
  Filled 2017-06-21 (×2): qty 2

## 2017-06-21 MED ORDER — VITAMIN B-12 1000 MCG PO TABS
1000.0000 ug | ORAL_TABLET | Freq: Every day | ORAL | Status: DC
  Administered 2017-06-21 – 2017-06-24 (×4): 1000 ug via ORAL
  Filled 2017-06-21 (×4): qty 1

## 2017-06-21 MED ORDER — METHYLPREDNISOLONE SODIUM SUCC 125 MG IJ SOLR
60.0000 mg | INTRAMUSCULAR | Status: AC
  Administered 2017-06-22: 60 mg via INTRAVENOUS
  Filled 2017-06-21: qty 2

## 2017-06-21 MED ORDER — FUROSEMIDE 10 MG/ML IJ SOLN
20.0000 mg | Freq: Once | INTRAMUSCULAR | Status: AC
  Administered 2017-06-21: 20 mg via INTRAVENOUS
  Filled 2017-06-21: qty 2

## 2017-06-21 MED ORDER — FERROUS SULFATE 325 (65 FE) MG PO TABS
325.0000 mg | ORAL_TABLET | Freq: Three times a day (TID) | ORAL | Status: DC
  Administered 2017-06-21 – 2017-06-24 (×12): 325 mg via ORAL
  Filled 2017-06-21 (×12): qty 1

## 2017-06-21 MED ORDER — IPRATROPIUM-ALBUTEROL 0.5-2.5 (3) MG/3ML IN SOLN
3.0000 mL | Freq: Three times a day (TID) | RESPIRATORY_TRACT | Status: DC
  Administered 2017-06-22: 3 mL via RESPIRATORY_TRACT
  Filled 2017-06-21: qty 3

## 2017-06-21 MED ORDER — ONDANSETRON HCL 4 MG/2ML IJ SOLN: 4.0000 mg | Freq: Four times a day (QID) | INTRAMUSCULAR | Status: DC | PRN

## 2017-06-21 MED ORDER — METHYLPREDNISOLONE SODIUM SUCC 125 MG IJ SOLR
125.0000 mg | Freq: Once | INTRAMUSCULAR | Status: AC
  Administered 2017-06-21: 125 mg via INTRAVENOUS
  Filled 2017-06-21: qty 2

## 2017-06-21 NOTE — Progress Notes (Signed)
PROGRESS NOTE    Kyle Armstrong  NID:[id number] DOB: [birth year] DOA: 06/21/2017 PCP: Lawerance Cruel, MD   Brief Narrative:  83 y.o. WM PMHx HLD, GERD, Hypothyroidism, Pernicious Anemia, and SVT, Gastric Cancer (s/p of radiation, chemotherapy, gastrectomy), BPH, Sarcoidosis of Lung, Anemia,   Presents with SOB and leg edema. Patient states that his shortness of breath to started yesterday afternoon, which has been progressively getting worse. He has a dry cough and no wheezing, but no chest pain, fever or chills. Denies tenderness in the calvarium. He has severe bilateral lower leg edema. No nausea, vomiting, diarrhea, abdominal pain, symptoms of UTI or unilateral weakness.  ED Course: pt was found to have WBC 5.2, negative troponin, BNP 843, sodium 122, creatinine normal, oxygen saturation 99% on 2 L nasal cannula oxygen. Chest x-ray showed Small pleural effusions and worsening fibrotic appearing interstitial changes. Patient is admitted to stepdown as inpatient.   Subjective: 7/29 A/O 4, negative CP, negative SOB, negative N/V, negative abdominal pain. States previously had abnormal heart rhythm (sounds like atrial fibrillation with DC CV). Believes he was cardioverted ~2016.    Assessment & Plan:   Principal Problem:   Acute respiratory failure with hypoxia (HCC) Active Problems:   Hyperlipidemia   Pernicious anemia   Hypothyroidism   Cancer of antrum of stomach s/p distal gastrectomy/B2/feeding jejunostomy 02/15/2014   Severe protein-calorie malnutrition (HCC)   Sarcoidosis of lung (HCC)   Hyponatremia      Acute respiratory failure with hypoxia ([place]):  -Most likely multifactorial to include fluid overload from CHF, A. fib with RVR, bronchospasm in the setting of lung sarcoidosis, and electrolyte imbalance.  Per admission note Pt hypervolemic hyponatremia with Na of 122, mental status normal. Case discussed PCCM, Dr. Jimmy Footman for possiblly starting combination  hypertonic saline plus Lasix. Dr. Jimmy Footman recomended not to start hypertonic saline-->it is OK to use lasix carefully now. -Respiratory virus panel negative -DuoNeb QID  -Solu-Medrol 60 mg daily  CHF with retained EF/AV stenosis -Strict in and out -Daily weight -Lasix 60 mg TID -Metoprolol 12.5 mg BID  A. fib with RVR/SVT -Currently in NSR with occasional PVC -Metoprolol IV 5 mg 1 -TSH normal  Hypervolemic hyponatremia:  -Etiology is not clear. Mental status normal. Differential diagnosis includes poor oral intake, SIADS and thyroid Dysfunction.  - Will check urine sodium, urine osmolality, serum osmolality. - Fluid restriction  Sarcoidosis of lung:  -Patient states that he was told to have sarcoidosis long time ago, but has not been treated for this issue. Calcium 8.5 -Empiric treatment as above  HLD: -Lipid panel pending  Pernicious anemia:  -hgb 9.82 which was 11.0 on 60/12/18. Patient is getting both oral and injection of Vb12 -continue and supplemented and oral VB12  Hypothyroidism:  -TSH WNL -Synthroid 50 g daily   Cancer of antrum of stomach s/p distal gastrectomy/B2/feeding jejunostomy 02/15/2014:  -Feeding tube removed significant time ago -S/P XRT  + chemotherapy per son -Follow-up Oncologist Dr. Betsy Coder  Severe protein-calorie malnutrition [facility]): -consult to nutrition    DVT prophylaxis: Lovenox Code Status: Full Family Communication: Daughter present at bedside Disposition Plan: TBD   Consultants:  None  Procedures/Significant Events:  7/29 Echocardiogram: LVEF= 55% to 60%.   - Aortic valve: moderate stenosis.  - Mitral valve: mild to moderate regurgitation. - Left atrium: severely dilated.    VENTILATOR SETTINGS: None   Cultures     Antimicrobials: Anti-infectives    None       Devices  LINES / TUBES:  None    Continuous Infusions: . sodium chloride       Objective: Vitals:   06/21/17 0430  06/21/17 0530 06/21/17 0600 06/21/17 0630  BP: 139/88 138/80 136/74 128/70  Pulse: 93 95 84 77  Resp: (!) 25 (!) 21 14 19   Temp:      TempSrc:      SpO2: 99% 98% 96% 98%  Weight:      Height:        Intake/Output Summary (Last 24 hours) at 06/21/17 0749 Last data filed at 06/21/17 0725  Gross per 24 hour  Intake                0 ml  Output              200 ml  Net             -200 ml   Filed Weights   06/21/17 0057  Weight: 165 lb (74.8 kg)    Examination:  General: A/O 4, No acute respiratory distress Neck:  Negative scars, masses, torticollis, lymphadenopathy, JVD Lungs:  bibasilar rhonchi, negative wheezing,  Cardiovascular: Regular rate and rhythm, positive PVC  without murmur gallop or rub normal S1 and S2 Abdomen: negative abdominal pain, nondistended, positive soft, bowel sounds, no rebound, no ascites, no appreciable mass Extremities: No significant cyanosis, clubbing. Positive bilateral pedal edema 3+ to hips Skin: Negative rashes, lesions, ulcers Psychiatric:  Negative depression, negative anxiety, negative fatigue, negative mania  Central nervous system:  Cranial nerves II through XII intact, tongue/uvula midline, all extremities muscle strength 5/5, sensation intact throughout,  negative dysarthria, negative expressive aphasia, negative receptive aphasia. .     Data Reviewed: Care during the described time interval was provided by me .  I have reviewed this patient's available data, including medical history, events of note, physical examination, and all test results as part of my evaluation. I have personally reviewed and interpreted all radiology studies.  CBC:  Recent Labs Lab 06/21/17 0121  WBC 5.2  NEUTROABS 3.7  HGB 9.8*  HCT 29.1*  MCV 92.1  PLT 300   Basic Metabolic Panel:  Recent Labs Lab 06/21/17 0121  NA 122*  K 3.8  CL 91*  CO2 26  GLUCOSE 107*  BUN 15  CREATININE 0.80  CALCIUM 8.5*   GFR: Estimated Creatinine Clearance: 65.3  mL/min (by C-G formula based on SCr of 0.8 mg/dL). Liver Function Tests:  Recent Labs Lab 06/21/17 0121  AST 18  ALT 28  ALKPHOS 82  BILITOT 0.7  PROT 5.3*  ALBUMIN 3.3*   No results for input(s): LIPASE, AMYLASE in the last 168 hours. No results for input(s): AMMONIA in the last 168 hours. Coagulation Profile: No results for input(s): INR, PROTIME in the last 168 hours. Cardiac Enzymes:  Recent Labs Lab 06/21/17 0634  TROPONINI <0.03   BNP (last 3 results) No results for input(s): PROBNP in the last 8760 hours. HbA1C: No results for input(s): HGBA1C in the last 72 hours. CBG: No results for input(s): GLUCAP in the last 168 hours. Lipid Profile: No results for input(s): CHOL, HDL, LDLCALC, TRIG, CHOLHDL, LDLDIRECT in the last 72 hours. Thyroid Function Tests: No results for input(s): TSH, T4TOTAL, FREET4, T3FREE, THYROIDAB in the last 72 hours. Anemia Panel: No results for input(s): VITAMINB12, FOLATE, FERRITIN, TIBC, IRON, RETICCTPCT in the last 72 hours. Urine analysis:    Component Value Date/Time   COLORURINE YELLOW 02/09/2014 [place] 02/09/2014  1317   LABSPEC 1.012 02/09/2014 1317   PHURINE 6.5 02/09/2014 1317   GLUCOSEU NEGATIVE 02/09/2014 1317   HGBUR NEGATIVE 02/09/2014 [place] 02/09/2014 1317   KETONESUR NEGATIVE 02/09/2014 1317   PROTEINUR NEGATIVE 02/09/2014 1317   UROBILINOGEN 1.0 02/09/2014 1317   NITRITE NEGATIVE 02/09/2014 1317   LEUKOCYTESUR NEGATIVE 02/09/2014 1317   Sepsis Labs: @LABRCNTIP (procalcitonin:4,lacticidven:4)  )No results found for this or any previous visit (from the past 240 hour(s)).       Radiology Studies: Dg Chest 2 View  Result Date: 06/21/2017 CLINICAL DATA:  Dyspnea and wheezing for 1 day EXAM: CHEST  2 VIEW COMPARISON:  12/25/2013 FINDINGS: Severe fibrotic interstitial changes with some worsening from 12/25/2013. Small pleural effusions bilaterally. No focal airspace  consolidation. IMPRESSION: Small pleural effusions. Worsening fibrotic appearing interstitial changes. Electronically Signed   By: Andreas Newport M.D.   On: 06/21/2017 02:19        Scheduled Meds: . aspirin EC  81 mg Oral Daily  . celecoxib  200 mg Oral BID  . dextromethorphan-guaiFENesin  1 tablet Oral BID  . enoxaparin (LOVENOX) injection  40 mg Subcutaneous Q24H  . famotidine  20 mg Oral Daily  . ferrous sulfate  325 mg Oral TID WC  . ipratropium-albuterol  3 mL Nebulization Q4H  . levothyroxine  50 mcg Oral q morning - 10a  . methylPREDNISolone sodium succinate  60 mg Intravenous Q8H  . simvastatin  40 mg Oral QPM  . sodium chloride flush  3 mL Intravenous Q12H  . tamsulosin  0.4 mg Oral Daily  . cyanocobalamin  1,000 mcg Oral Daily   Continuous Infusions: . sodium chloride       LOS: 0 days    Time spent: 40 minutes    Sufyaan Palma, Geraldo Docker, MD [facility] Pager [phone]   If 7PM-7AM, please contact night-coverage [email or web address] Password [facility] 06/21/2017, 7:49 AM

## 2017-06-21 NOTE — H&P (Addendum)
History and Physical    Kyle Armstrong FVC:[id number] DOB: [birth year] DOA: 06/21/2017  Referring MD/NP/PA:   PCP: Lawerance Cruel, MD   Patient coming from:  The patient is coming from home.  At baseline, pt is partially dependent for most of ADL.  Chief Complaint: SOB, leg edema  HPI: Kyle Armstrong is a 83 y.o. male with medical history significant of hyperlipidemia, GERD, hypothyroidism, pernicious anemia, and SVT, gastric cancer (s/p of radiation, chemotherapy, gastrectomy), BPH, sarcoidosis of lung, anemia, who presents with SOB and leg edema.  Patient states that his shortness of breath to started yesterday afternoon, which has been progressively getting worse. He has a dry cough and no wheezing, but no chest pain, fever or chills. Denies tenderness in the calvarium. He has severe bilateral lower leg edema. No nausea, vomiting, diarrhea, abdominal pain, symptoms of UTI or unilateral weakness.  ED Course: pt was found to have WBC 5.2, negative troponin, BNP 843, sodium 122, creatinine normal, oxygen saturation 99% on 2 L nasal cannula oxygen. Chest x-ray showed Small pleural effusions and worsening fibrotic appearing interstitial changes. Patient is admitted to stepdown as inpatient.  Review of Systems:   General: no fevers, chills, no changes in body weight, has poor appetite, has fatigue HEENT: no blurry vision, hearing changes or sore throat Respiratory: has dyspnea, coughing, wheezing CV: no chest pain, no palpitations GI: no nausea, vomiting, abdominal pain, diarrhea, constipation GU: no dysuria, burning on urination, increased urinary frequency, hematuria  Ext: has leg edema Neuro: no unilateral weakness, numbness, or tingling, no vision change or hearing loss Skin: no rash, no skin tear. MSK: No muscle spasm, no deformity, no limitation of range of movement in spin Heme: No easy bruising.  Travel history: No recent long distant travel.  Allergy:  Allergies    Allergen Reactions  . Lipitor [Atorvastatin] Other (See Comments)    Leg cramps  . Amoxicillin Rash    rash  . Augmentin [Amoxicillin-Pot Clavulanate] Rash    Past Medical History:  Diagnosis Date  . Allergy   . Arthritis   . Eczema   . Gastric cancer ([place]) 10/24/2013  . Hx of radiation therapy 11/10/13-12/21/13   gastric ca, total 50.4Gy  . Hyperlipidemia   . Hypothyroidism   . Lazy eye of right side   . Pernicious anemia   . 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 EP study and ablation  07/16/12   slow pathway ablation for AVNRT by DR Allred  . HERNIA REPAIR  1980's   inguinal done x 2  . LAPAROSCOPIC CHOLECYSTECTOMY W/ CHOLANGIOGRAPHY  2011  . LAPAROSCOPIC PARTIAL GASTRECTOMY N/A 02/15/2014   Procedure: LAPAROSCOPIC diagnositc, distal gastrostomy  feeding jejunostomy;  Surgeon: Stark Klein, MD;  Location: [facility];  Service: General;  Laterality: N/A;  . LAPAROSCOPIC [place] Bilateral 2007   bil recurrent inguinal hernias  . SUPRAVENTRICULAR TACHYCARDIA ABLATION Bilateral 07/16/2012   Procedure: SUPRAVENTRICULAR TACHYCARDIA ABLATION;  Surgeon: Thompson Grayer, MD;  Location: [facility] CATH LAB;  Service: Cardiovascular;  Laterality: Bilateral;    Social History:  reports that he quit smoking about 26 years ago. His smoking use included Cigarettes. He has a 30.00 pack-year smoking history. He has never used smokeless tobacco. He reports that he does not drink alcohol or use drugs.  Family History:  Family History  Problem Relation Age of Onset  . Heart attack  Mother   . Heart attack Father   . Hypertension Unknown      Prior to Admission medications   Medication Sig Start Date End Date Taking? Authorizing Provider  acetaminophen (TYLENOL) 500 MG tablet Take 1,000 mg by mouth every 6 (six) hours as needed for mild pain or moderate pain.    Yes [provider]  celecoxib (CELEBREX) 200 MG capsule Take 200 mg by mouth 2 (two) times daily. 04/29/17  Yes [provider]  Cyanocobalamin (VITAMIN B-12 IJ) Inject as directed every 30 (thirty) days.    Yes [provider]  cyanocobalamin 1000 MCG tablet Take 1,000 mcg by mouth daily.   Yes [provider]  ferrous sulfate 325 (65 FE) MG EC tablet Take 325 mg by mouth 3 (three) times daily with meals. 06/16/14  Yes Ladell Pier, MD  levothyroxine (SYNTHROID, LEVOTHROID) 50 MCG tablet Take 50 mcg by mouth every morning.   Yes [provider]  pantoprazole (PROTONIX) 40 MG tablet Take 40 mg by mouth 2 (two) times daily as needed (for acid reflux).    Yes [provider]  ranitidine (ZANTAC) 150 MG capsule Take 150 mg by mouth as needed for heartburn.    Yes [provider]  simvastatin (ZOCOR) 40 MG tablet Take 40 mg by mouth every evening.   Yes [provider]  tamsulosin (FLOMAX) 0.4 MG CAPS capsule Take 0.4 mg by mouth daily. 03/25/17  Yes [provider]    Physical Exam: Vitals:   06/21/17 0430 06/21/17 0530 06/21/17 0600 06/21/17 0630  BP: 139/88 138/80 136/74 128/70  Pulse: 93 95 84 77  Resp: (!) 25 (!) 21 14 19   Temp:      TempSrc:      SpO2: 99% 98% 96% 98%  Weight:      Height:       General: Not in acute distress HEENT:       Eyes: PERRL, EOMI, no scleral icterus.       ENT: No discharge from the ears and nose, no pharynx injection, no tonsillar enlargement.        Neck: No JVD, no bruit, no mass felt. Heme: No neck lymph node enlargement. Cardiac: S1/S2, RRR, No murmurs, No gallops or rubs. Respiratory: Has a significantly decreased air movement bilaterally and wheezing bilaterally. GI: Soft, nondistended, nontender, no rebound pain, no organomegaly, BS present. GU: No hematuria Ext: 3+ pitting leg edema bilaterally. 2+DP/PT pulse bilaterally. Musculoskeletal: No joint deformities, No joint redness  or warmth, no limitation of ROM in spin. Skin: No rashes.  Neuro: Alert, oriented X3, cranial nerves II-XII grossly intact, moves all extremities normally.  Psych: Patient is not psychotic, no suicidal or hemocidal ideation.  Labs on Admission: I have personally reviewed following labs and imaging studies  CBC:  Recent Labs Lab 06/21/17 0121  WBC 5.2  NEUTROABS 3.7  HGB 9.8*  HCT 29.1*  MCV 92.1  PLT 811   Basic Metabolic Panel:  Recent Labs Lab 06/21/17 0121  NA 122*  K 3.8  CL 91*  CO2 26  GLUCOSE 107*  BUN 15  CREATININE 0.80  CALCIUM 8.5*   GFR: Estimated Creatinine Clearance: 65.3 mL/min (by C-G formula based on SCr of 0.8 mg/dL). Liver Function Tests:  Recent Labs Lab 06/21/17 0121  AST 18  ALT 28  ALKPHOS 82  BILITOT 0.7  PROT 5.3*  ALBUMIN 3.3*   No results for input(s): LIPASE, AMYLASE in the last 168 hours. No  results for input(s): AMMONIA in the last 168 hours. Coagulation Profile: No results for input(s): INR, PROTIME in the last 168 hours. Cardiac Enzymes: No results for input(s): CKTOTAL, CKMB, CKMBINDEX, TROPONINI in the last 168 hours. BNP (last 3 results) No results for input(s): PROBNP in the last 8760 hours. HbA1C: No results for input(s): HGBA1C in the last 72 hours. CBG: No results for input(s): GLUCAP in the last 168 hours. Lipid Profile: No results for input(s): CHOL, HDL, LDLCALC, TRIG, CHOLHDL, LDLDIRECT in the last 72 hours. Thyroid Function Tests: No results for input(s): TSH, T4TOTAL, FREET4, T3FREE, THYROIDAB in the last 72 hours. Anemia Panel: No results for input(s): VITAMINB12, FOLATE, FERRITIN, TIBC, IRON, RETICCTPCT in the last 72 hours. Urine analysis:    Component Value Date/Time   COLORURINE YELLOW 02/09/2014 [place] 02/09/2014 1317   LABSPEC 1.012 02/09/2014 1317   PHURINE 6.5 02/09/2014 1317   GLUCOSEU NEGATIVE 02/09/2014 1317   HGBUR NEGATIVE 02/09/2014 1317   BILIRUBINUR NEGATIVE  02/09/2014 1317   KETONESUR NEGATIVE 02/09/2014 1317   PROTEINUR NEGATIVE 02/09/2014 1317   UROBILINOGEN 1.0 02/09/2014 1317   NITRITE NEGATIVE 02/09/2014 1317   LEUKOCYTESUR NEGATIVE 02/09/2014 1317   Sepsis Labs: @LABRCNTIP (procalcitonin:4,lacticidven:4) )No results found for this or any previous visit (from the past 240 hour(s)).   Radiological Exams on Admission: Dg Chest 2 View  Result Date: 06/21/2017 CLINICAL DATA:  Dyspnea and wheezing for 1 day EXAM: CHEST  2 VIEW COMPARISON:  12/25/2013 FINDINGS: Severe fibrotic interstitial changes with some worsening from 12/25/2013. Small pleural effusions bilaterally. No focal airspace consolidation. IMPRESSION: Small pleural effusions. Worsening fibrotic appearing interstitial changes. Electronically Signed   By: Andreas Newport M.D.   On: 06/21/2017 02:19     EKG: Independently reviewed.  Sinus rhythm, QTC 452, PVC, LAD.  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 Patient has bronchospasm in the setting of lung sarcoidosis. No infiltration on chest x-ray. Another differential diagnoses is acutely CHF given elevated BNP 843, and 3+ bilateral leg edema, but patient does not have history of hypertension or CHF. Patient has hypervolemic hyponatremia with Na of 122, mental status normal. I discussed PCCM, Dr. Jimmy Footman for possiblly starting combination hypertonic saline plus Lasix. Dr. Jimmy Footman recomended not to start hypertonic saline-->it is OK to use lasix carefully now.  -will admit to SDU as inpt -DuoNeb nebulizer, when necessary albuterol nebulizer -Solu-Medrol 60 mg 3 times a  day, -When necessary Mucinex for cough -Check respiratory virus panel -pt was given 20 mg of Lasix by EDP-->will need to wait another BMP for Na level to decide if lasix should be continued -trop x 3 -2d echo -start ASA -Daily weights -strict I/O's -Low salt diet  Hypervolemic hyponatremia: Etiology is not clear. Mental status normal. Differential diagnosis includes poor oral intake, SIADS and thyroid dysfunction. Per PCCM, Dr. Jimmy Footman, hold off hypertonic saline now - Will check urine sodium, urine osmolality, serum osmolality. - check TSH - Fluid restriction - check BMP q6h while giving lasix  Sarcoidosis of lung: Patient states that he was told to have sarcoidosis long time ago, but has not been treated for this issue. Calcium 8.5 -Empiric treatment as above  HLD: -zovor  Pernicious anemia: hgb 9.82 which was 11.0 on 60/12/18. 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 S/p of RXT and chemo per his son. -f/u with his oncologist, Dr. Clarice Pole  Severe protein-calorie malnutrition [facility]): -consult to nutrition   DVT ppx: sQ Lovenox Code Status: Full code Family Communication: Yes, patient's son and son-in law at bed side Disposition Plan:  Anticipate discharge back to previous home environment Consults called:  none Admission status: SDU/inpation       Date of Service 06/21/2017    Ivor Costa [facility] Pager [phone]  If 7PM-7AM, please contact night-coverage [email or web address] Password [facility] 06/21/2017, 6:41 AM

## 2017-06-21 NOTE — ED Triage Notes (Signed)
Pt to ED with c/o shortness of breath, onset earlier today.  Pt st's he feels like he has to breath through his mouth to get air.

## 2017-06-21 NOTE — ED Notes (Signed)
Admitting at bedside 

## 2017-06-21 NOTE — ED Notes (Signed)
I-stat troponin not crossing over, 0.02. EDP aware.

## 2017-06-21 NOTE — Progress Notes (Signed)
  Echocardiogram 2D Echocardiogram has been performed.  Donata Clay 06/21/2017, 11:44 AM

## 2017-06-21 NOTE — ED Provider Notes (Signed)
[place] DEPT Provider Note   CSN: [id number] Arrival date & time: 06/21/17  5631 By signing my name below, I, Dyke Brackett, attest that this documentation has been prepared under the direction and in the presence of Alfredo Spong, Gwenyth Allegra, MD . Electronically Signed: Dyke Brackett, Scribe. 06/21/2017. 2:41 AM.   History   Chief Complaint Chief Complaint  Patient presents with  . Shortness of Breath    HPI Kyle Armstrong is a 83 y.o. male with a history of sarcoidosis who presents to the Emergency Department complaining of waxing and waning shortness of breath onset this afternoon. Pt expresses that he has to breath through his mouth in order to get air. Pt and family members report associated wheezing, BLE edema, generalized weakness and dizziness. No alleviating or modifying factors noted.  No OTC treatments tried for these symptoms PTA.  He denies any cough. Pt has no other acute complaints or associated symptoms at this time.    The history is provided by the patient and a relative. No language interpreter was used.    Past Medical History:  Diagnosis Date  . Allergy   . Arthritis   . Eczema   . Gastric cancer ([place]) 10/24/2013  . Hx of radiation therapy 11/10/13-12/21/13   gastric ca, total 50.4Gy  . Hyperlipidemia   . Hypothyroidism   . Lazy eye of right side   . Pernicious anemia   . 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 Malfunctioning jejunostomy tube ([place]) 05/15/2014  . Acute esophagitis 02/28/2014  . Diarrhea 02/28/2014  . HOH (hard of hearing) 02/18/2014  . Severe protein-calorie malnutrition ([place]) 02/18/2014  . Jejunostomy tube present ([place]) 02/17/2014  . 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 Hyperlipidemia   . Pernicious anemia   . Hypothyroidism   . Arthritis   . SVT (supraventricular  tachycardia) ([place]) 06/23/2012    Past Surgical History:  Procedure Laterality Date  . EP study and ablation  07/16/12   slow pathway ablation for AVNRT by DR Allred  . HERNIA REPAIR  1980's   inguinal done x 2  . LAPAROSCOPIC CHOLECYSTECTOMY W/ CHOLANGIOGRAPHY  2011  . LAPAROSCOPIC PARTIAL GASTRECTOMY N/A 02/15/2014   Procedure: LAPAROSCOPIC diagnositc, distal gastrostomy  feeding jejunostomy;  Surgeon: Stark Klein, MD;  Location: [facility];  Service: General;  Laterality: N/A;  . LAPAROSCOPIC [place] Bilateral 2007   bil recurrent inguinal hernias  . SUPRAVENTRICULAR TACHYCARDIA ABLATION Bilateral 07/16/2012   Procedure: SUPRAVENTRICULAR TACHYCARDIA ABLATION;  Surgeon: Thompson Grayer, MD;  Location: [facility] CATH LAB;  Service: Cardiovascular;  Laterality: Bilateral;       Home Medications    Prior to Admission medications   Medication Sig Start Date End Date Taking? Authorizing Provider  acetaminophen (TYLENOL) 500 MG tablet Take 1,000 mg by mouth every 6 (six) hours as needed for mild pain or moderate pain.    Yes [provider]  celecoxib (CELEBREX) 200 MG capsule Take 200 mg by mouth 2 (two) times daily. 04/29/17  Yes [provider]  Cyanocobalamin (VITAMIN B-12 IJ) Inject as directed every 30 (thirty) days.    Yes [provider]  cyanocobalamin 1000 MCG tablet Take 1,000 mcg by mouth daily.   Yes [provider]  ferrous sulfate 325 (65 FE) MG EC tablet Take 325 mg by mouth 3 (three) times daily with  meals. 06/16/14  Yes Ladell Pier, MD  levothyroxine (SYNTHROID, LEVOTHROID) 50 MCG tablet Take 50 mcg by mouth every morning.   Yes [provider]  pantoprazole (PROTONIX) 40 MG tablet Take 40 mg by mouth 2 (two) times daily as needed (for acid reflux).    Yes [provider]  ranitidine (ZANTAC) 150 MG capsule Take 150 mg by mouth as needed for heartburn.    Yes [provider]  simvastatin  (ZOCOR) 40 MG tablet Take 40 mg by mouth every evening.   Yes [provider]  tamsulosin (FLOMAX) 0.4 MG CAPS capsule Take 0.4 mg by mouth daily. 03/25/17  Yes [provider]    Family History Family History  Problem Relation Age of Onset  . Heart attack Mother   . Heart attack Father   . Hypertension Unknown     Social History Social History  Substance Use Topics  . Smoking status: Former Smoker    Packs/day: 1.00    Years: 30.00    Types: Cigarettes    Quit date: 11/24/1990  . Smokeless tobacco: Never Used  . Alcohol use No     Allergies   Lipitor [atorvastatin]; Amoxicillin; and Augmentin [amoxicillin-pot clavulanate]   Review of Systems Review of Systems All systems reviewed and are negative for acute change except as noted in the HPI.  Physical Exam Updated Vital Signs BP (!) 142/80   Pulse (!) 103   Temp (!) 97.4 F (36.3 C) (Oral)   Resp 17   Ht 5\' 8"  (1.727 m)   Wt 74.8 kg (165 lb)   SpO2 99%   BMI 25.09 kg/m   Physical Exam  Constitutional: He is oriented to person, place, and time. He appears well-developed and well-nourished. No distress.  HENT:  Head: Normocephalic and atraumatic.  Right Ear: Hearing normal.  Left Ear: Hearing normal.  Nose: Nose normal.  Mouth/Throat: Oropharynx is clear and moist and mucous membranes are normal.  Eyes: Pupils are equal, round, and reactive to light. Conjunctivae and EOM are normal.  Neck: Normal range of motion. Neck supple.  Cardiovascular: Regular rhythm, S1 normal and S2 normal.  Exam reveals no gallop and no friction rub.   No murmur heard. Pulmonary/Chest: Effort normal. No respiratory distress. He has decreased breath sounds. He has wheezes. He exhibits no tenderness.   Significantly diminished breath sounds bilaterally with wheezes at the bases  Abdominal: Soft. Normal appearance and bowel sounds are normal. There is no hepatosplenomegaly. There is no tenderness. There is no rebound, no  guarding, no tenderness at McBurney's point and negative Murphy's sign. No hernia.  Musculoskeletal: Normal range of motion. He exhibits edema.  2+ pitting edema to BLE  Neurological: He is alert and oriented to person, place, and time. He has normal strength. No cranial nerve deficit or sensory deficit. Coordination normal. GCS eye subscore is 4. GCS verbal subscore is 5. GCS motor subscore is 6.  Skin: Skin is warm, dry and intact. No rash noted. No cyanosis.  Psychiatric: He has a normal mood and affect. His speech is normal and behavior is normal. Thought content normal.  Nursing note and vitals reviewed.  ED Treatments / Results  DIAGNOSTIC STUDIES:  Oxygen Saturation is 99% on RA, normal by my interpretation.    COORDINATION OF CARE:  2:31 AM Discussed treatment plan with pt at bedside and pt agreed to plan.   Labs (all labs ordered are listed, but only abnormal results are displayed) Labs Reviewed  CBC  WITH DIFFERENTIAL/PLATELET - Abnormal; Notable for the following:       Result Value   RBC 3.16 (*)    Hemoglobin 9.8 (*)    HCT 29.1 (*)    Lymphs Abs 0.5 (*)    All other components within normal limits  COMPREHENSIVE METABOLIC PANEL - Abnormal; Notable for the following:    Sodium 122 (*)    Chloride 91 (*)    Glucose, Bld 107 (*)    Calcium 8.5 (*)    Total Protein 5.3 (*)    Albumin 3.3 (*)    All other components within normal limits  BRAIN NATRIURETIC PEPTIDE - Abnormal; Notable for the following:    B Natriuretic Peptide 843.8 (*)    All other components within normal limits  I-STAT TROPONIN, ED    EKG  EKG Interpretation None       Radiology Dg Chest 2 View  Result Date: 06/21/2017 CLINICAL DATA:  Dyspnea and wheezing for 1 day EXAM: CHEST  2 VIEW COMPARISON:  12/25/2013 FINDINGS: Severe fibrotic interstitial changes with some worsening from 12/25/2013. Small pleural effusions bilaterally. No focal airspace consolidation. IMPRESSION: Small pleural  effusions. Worsening fibrotic appearing interstitial changes. Electronically Signed   By: Andreas Newport M.D.   On: 06/21/2017 02:19    Procedures Procedures (including critical care time)  Medications Ordered in ED Medications  furosemide (LASIX) injection 20 mg (not administered)  methylPREDNISolone sodium succinate (SOLU-MEDROL) 125 mg/2 mL injection 125 mg (125 mg Intravenous Given 06/21/17 0304)  albuterol (PROVENTIL) (2.5 MG/3ML) 0.083% nebulizer solution 5 mg (5 mg Nebulization Given 06/21/17 0304)     Initial Impression / Assessment and Plan / ED Course  I have reviewed the triage vital signs and the nursing notes.  Pertinent labs & imaging results that were available during my care of the patient were reviewed by me and considered in my medical decision making (see chart for details).     Patient presents to the emergency department for evaluation of shortness of breath. Patient reports that he has a history of sarcoid lung. He did have significant bronchospasm present at arrival. He does not have a history of emphysema or asthma. He was treated with methylprednisolone and albuterol. He is still dyspneic and wheezing. X-ray shows increasing fibrotic changes consistent with sarcoid. A BMP, however, is elevated. I suspect that there is some element of congestive heart failure. In addition to this, he is found to have significant hyponatremia, unclear etiology. He does not take a diuretic at home. He will require diuresis during hospitalization, will need to have a sodium monitored closely. He will be admitted to the hospital for further management.  Final Clinical Impressions(s) / ED Diagnoses   Final diagnoses:  Hyponatremia  Sarcoidosis of lung (HCC)  Congestive heart failure, unspecified HF chronicity, unspecified heart failure type [facility])    New Prescriptions New Prescriptions   No medications on file    I personally performed the services described in this  documentation, which was scribed in my presence. The recorded information has been reviewed and is accurate.     Orpah Greek, MD 06/21/17 [phone]

## 2017-06-21 NOTE — ED Notes (Signed)
Pt. Transported to unit by RN on telemetry monitor. Pt. Has 2x hearing aids in ears, 1 pair of sneakers, and jean shorts on. Pt. Has shirt and pair of eye glasses in white belongings bag. Pt. Assisted to bed by 2x RN.

## 2017-06-21 NOTE — Progress Notes (Signed)
Pt with suspect AFib @ 12:30 pm. EKG complete, MD notified.

## 2017-06-22 LAB — BASIC METABOLIC PANEL
ANION GAP: 8 (ref 5–15)
BUN: 14 mg/dL (ref 6–20)
CHLORIDE: 90 mmol/L — AB (ref 101–111)
CO2: 29 mmol/L (ref 22–32)
Calcium: 8.6 mg/dL — ABNORMAL LOW (ref 8.9–10.3)
Creatinine, Ser: 0.9 mg/dL (ref 0.61–1.24)
GFR calc non Af Amer: >60 mL/min (ref >60)
Glucose, Bld: 109 mg/dL — ABNORMAL HIGH (ref 65–99)
POTASSIUM: 3.6 mmol/L (ref 3.5–5.1)
SODIUM: 127 mmol/L — AB (ref 135–145)

## 2017-06-22 LAB — LIPID PANEL
CHOLESTEROL: 126 mg/dL (ref 0–200)
HDL: 68 mg/dL (ref >40)
LDL Cholesterol: 52 mg/dL (ref 0–99)
TRIGLYCERIDES: 32 mg/dL (ref <150)
Total CHOL/HDL Ratio: 1.9 RATIO
VLDL: 6 mg/dL (ref 0–40)

## 2017-06-22 LAB — LEGIONELLA PNEUMOPHILA SEROGP 1 UR AG: L. pneumophila Serogp 1 Ur Ag: NEGATIVE

## 2017-06-22 LAB — GLUCOSE, CAPILLARY: Glucose-Capillary: 125 mg/dL — ABNORMAL HIGH (ref 65–99)

## 2017-06-22 LAB — MAGNESIUM: Magnesium: 1.6 mg/dL — ABNORMAL LOW (ref 1.7–2.4)

## 2017-06-22 MED ORDER — DM-GUAIFENESIN ER 30-600 MG PO TB12: 1.0000 | ORAL_TABLET | Freq: Two times a day (BID) | ORAL | Status: DC | PRN

## 2017-06-22 MED ORDER — PREDNISONE 20 MG PO TABS
40.0000 mg | ORAL_TABLET | Freq: Every day | ORAL | Status: DC
  Administered 2017-06-23 – 2017-06-24 (×2): 40 mg via ORAL
  Filled 2017-06-22 (×2): qty 2

## 2017-06-22 MED ORDER — ALBUTEROL SULFATE (2.5 MG/3ML) 0.083% IN NEBU: 2.5000 mg | INHALATION_SOLUTION | RESPIRATORY_TRACT | Status: DC | PRN

## 2017-06-22 MED ORDER — ACETAMINOPHEN 325 MG PO TABS: 650.0000 mg | ORAL_TABLET | Freq: Four times a day (QID) | ORAL | Status: DC | PRN

## 2017-06-22 MED ORDER — MAGNESIUM SULFATE 2 GM/50ML IV SOLN
2.0000 g | Freq: Once | INTRAVENOUS | Status: AC
  Administered 2017-06-22: 2 g via INTRAVENOUS
  Filled 2017-06-22: qty 50

## 2017-06-22 MED ORDER — BOOST PLUS PO LIQD
237.0000 mL | Freq: Two times a day (BID) | ORAL | Status: DC
  Administered 2017-06-23 – 2017-06-24 (×4): 237 mL via ORAL
  Filled 2017-06-22 (×8): qty 237

## 2017-06-22 NOTE — Progress Notes (Signed)
[facility] TEAM 1 - Stepdown/ICU TEAM  Kyle Armstrong  IOX:[id number] DOB: [birth year] DOA: 06/21/2017 PCP: Lawerance Cruel, MD    Brief Narrative:  83yo M Hx HLD, GERD, Hypothyroidism, Pernicious Anemia, SVT, Gastric Cancer (s/p of radiation, chemotherapy, gastrectomy), BPH, and Sarcoidosis of Lung who presented with 24hrs of SOB and leg edema.\  In the ED he was noted to have a sodium of 122, and an O2 sat 99% on 2 L nasal cannula oxygen. Chest x-ray showed a small pleural effusions and worsening fibrotic appearing interstitial changes.  Subjective: The patient is resting comfortably in bed.  He states he feels much better in general.  He denies chest pain nausea vomiting or abdominal pain.  Assessment & Plan:  Acute hypoxic respiratory failure  fluid overload/CHF compounded by A. fib with RVR + bronchospasm w/ lung sarcoidosis - has been titrated to RA w/o difficulty - severe fibrotic lung changes noted on CXR   Volume overload - peripheral edema  EF 55-60% via TTE 06/21/17 - unable to comment on DD - no evidence of R heart failure noted on TTE but clinically acts like RV failure   Sarcoidosis of lung Has never been tx for this issue/has never seen a Pulmonologist - clinically I suspect R heart failure due to his fibrotic lung disease, but no evidence of this noted on TTE - as sx are improving will not pursue further at this time   Hyponatremia Likely due to volume overload - improving w/ diuresis - cont lasix and follow   Mod AoS w/ functional bicuspid valve Noted on TTE this admit - given extent of pulm disease and advanced age do not feel he would be a candidate for corrective surgery   Mild to mod mitral regurg Noted on TTE this admit  A. 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 Benay Spice  Severe protein-calorie malnutrition consult to nutrition  DVT prophylaxis: lovenox Code Status: FULL CODE Family Communication: no family present at time of exam  Disposition Plan: transfer to tele bed - cont to diurese - PT/OT - watch HR and rhythm   Consultants:  none  Procedures: 7/29 TTE  Antimicrobials:  none  Objective: Blood pressure 131/78, pulse 93, temperature 98.2 F (36.8 C), temperature source Axillary, resp. rate 19, height 5\' 8"  (1.727 m), weight 77.1 kg (170 lb), SpO2 94 %.  Intake/Output Summary (Last 24 hours) at 06/22/17 1035 Last data filed at 06/22/17 0906  Gross per 24 hour  Intake             1140 ml  Output             3340 ml  Net            -2200 ml   Filed Weights   06/21/17 0800 06/21/17 0803 06/22/17 0500  Weight: 81.2 kg (179 lb 1.6 oz) 79.9 kg (176 lb 3.2 oz) 77.1 kg (170 lb)    Examination: General: No acute respiratory distress at rest  Lungs: Fine crackles diffusely with no appreciable wheeze Cardiovascular: Regular rate and rhythm without murmur gallop or rub normal S1 and S2  Abdomen: Nontender, nondistended, soft, bowel sounds positive, no rebound, no ascites, no appreciable mass Extremities: 2+ doughy bilateral lower extremity edema without erythema  CBC:  Recent Labs Lab 06/21/17 0121  WBC 5.2  NEUTROABS 3.7  HGB 9.8*  HCT 29.1*  MCV 92.1  PLT 132   Basic Metabolic Panel:  Recent Labs Lab 06/21/17 0121 06/21/17 0910 06/21/17 1131 06/21/17 2012 06/22/17 0305  NA 122* 124* 123* 123* 127*  K 3.8 3.6 3.8 3.5 3.6  CL 91* 89* 89* 87* 90*  CO2 26 23 25 26 29   GLUCOSE 107* 223* 200* 171* 109*  BUN 15 12 12 12 14   CREATININE 0.80 0.86 0.94 0.92 0.90  CALCIUM 8.5* 8.6* 8.7* 8.9 8.6*  MG  --   --   --   --  1.6*   GFR: Estimated Creatinine  Clearance: 58.1 mL/min (by C-G formula based on SCr of 0.9 mg/dL).  Liver Function Tests:  Recent Labs Lab 06/21/17 0121  AST 18  ALT 28  ALKPHOS 82  BILITOT 0.7  PROT 5.3*  ALBUMIN 3.3*   Cardiac Enzymes:  Recent Labs Lab 06/21/17 0634 06/21/17 1131 06/21/17 1827  TROPONINI <0.03 <0.03 <0.03    HbA1C: Hgb A1c MFr Bld  Date/Time Value Ref Range Status  02/09/2014 01:20 PM 5.5 <5.7 % Final    Comment:    (NOTE)                                                                       According to the ADA Clinical Practice Recommendations for 2011, when HbA1c is used as a screening test:  >=6.5%   Diagnostic of Diabetes Mellitus           (if abnormal result is confirmed) 5.7-6.4%   Increased risk of developing Diabetes Mellitus References:Diagnosis and Classification of Diabetes Mellitus,Diabetes GMWN,0272,53(GUYQI 1):S62-S69 and Standards of Medical Care in         Diabetes - 2011,Diabetes HKVQ,2595,63 (Suppl 1):S11-S61.     Recent Results (from the past 240 hour(s))  Respiratory Panel by PCR     Status: None   Collection Time: 06/21/17  9:59 AM  Result Value Ref Range Status   Adenovirus NOT DETECTED NOT DETECTED Final   Coronavirus 229E NOT DETECTED NOT DETECTED Final   Coronavirus HKU1 NOT DETECTED NOT DETECTED Final   Coronavirus NL63 NOT DETECTED NOT DETECTED Final   Coronavirus OC43 NOT DETECTED NOT DETECTED Final   Metapneumovirus NOT DETECTED NOT DETECTED Final   Rhinovirus / Enterovirus NOT DETECTED NOT DETECTED Final   Influenza A NOT DETECTED NOT DETECTED Final   Influenza B NOT DETECTED NOT DETECTED Final   Parainfluenza Virus 1 NOT DETECTED NOT DETECTED Final   Parainfluenza Virus 2 NOT DETECTED NOT DETECTED Final   Parainfluenza Virus 3 NOT DETECTED NOT DETECTED Final   Parainfluenza Virus 4 NOT DETECTED NOT DETECTED Final   Respiratory Syncytial Virus NOT DETECTED NOT DETECTED Final   Bordetella pertussis NOT DETECTED NOT DETECTED Final    Chlamydophila pneumoniae NOT DETECTED NOT DETECTED Final   Mycoplasma pneumoniae NOT DETECTED NOT DETECTED Final  MRSA PCR Screening     Status: None   Collection Time: 06/21/17  9:59 AM  Result Value Ref Range Status  MRSA by PCR NEGATIVE NEGATIVE Final    Comment:        The GeneXpert MRSA Assay (FDA approved for NASAL specimens only), is one component of a comprehensive MRSA colonization surveillance program. It is not intended to diagnose MRSA infection nor to guide or monitor treatment for MRSA infections.      Scheduled Meds: . aspirin EC  81 mg Oral Daily  . celecoxib  200 mg Oral BID  . dextromethorphan-guaiFENesin  1 tablet Oral BID  . enoxaparin (LOVENOX) injection  40 mg Subcutaneous Q24H  . famotidine  20 mg Oral Daily  . ferrous sulfate  325 mg Oral TID WC  . furosemide  60 mg Intravenous TID  . ipratropium-albuterol  3 mL Nebulization TID  . levothyroxine  50 mcg Oral QAC breakfast  . methylPREDNISolone sodium succinate  60 mg Intravenous Q24H  . metoprolol tartrate  12.5 mg Oral BID  . simvastatin  40 mg Oral QPM  . sodium chloride flush  3 mL Intravenous Q12H  . tamsulosin  0.4 mg Oral Daily  . cyanocobalamin  1,000 mcg Oral Daily     LOS: 1 day   Cherene Altes, MD [facility] Office  [phone] Pager - Text Page per Amion as per below:  On-Call/Text Page:      [email or web address]      password [email or web address]  If 7PM-7AM, please contact night-coverage [email or web address] Password [email or web address] 06/22/2017, 10:35 AM

## 2017-06-22 NOTE — Progress Notes (Signed)
Initial Nutrition Assessment  DOCUMENTATION CODES:   Not applicable  INTERVENTION:   -Boost Plus BID, each supplement provides 360 kcals and 16 grams protein  NUTRITION DIAGNOSIS:   Increased nutrient needs related to chronic illness (sarcoidosis) as evidenced by estimated needs.  GOAL:   Patient will meet greater than or equal to 90% of their needs  MONITOR:   PO intake, Supplement acceptance, Diet advancement, Labs, Weight trends, Skin, I & O's  REASON FOR ASSESSMENT:   Consult Assessment of nutrition requirement/status  ASSESSMENT:   Kyle Armstrong is a 83 y.o. male with medical history significant of hyperlipidemia, GERD, hypothyroidism, pernicious anemia, and SVT, gastric cancer (s/p of radiation, chemotherapy, gastrectomy), BPH, sarcoidosis of lung, anemia, who presents with SOB and leg edema.  Pt admitted with acute respiratory failure with hypoxia, likely related to bronchospasm secondary to lung sarcoidosis.   Spoke with pt, who reports a fair appetite. He generally consumes 3 meals per day (Breakfast: bacon, eggs, toast, lunch: half of meat, starch, vegetable, dinner: other half of lunch meal). Pt reports he often eat lunch out at establishments like [employer] and [employer], and consumes about half during lunch and the other half at dinner. He reports he eats less than he typically does, due to surgery related to gastric cancer. He shares UBW is around 170# and denies any recent weight loss. However, pt endorses weight loss when undergoing chemotherapy several years ago.   Pt consumed 100% of breakfast meal per his report. He also consumed 1-2 Boost supplements daily.  Nutrition-Focused physical exam completed. Findings are mild to moderate fat depletion, mild muscle depletion, and no edema. Suspect muscle loss is related to advanced age. Pt remains very active, continues to work at [employer] part time.   Medications reviewed and include vitamin B-12 and ferrous  sulfate.   Discussed importance of good meal intake to promote healing. Pt amenable to continue Boost supplements.   Pt does not meet criteria for malnutrition at this time, however, is at risk given advanced age and multiple medical issues.   Albumin has a half-life of 21 days and is strongly affected by stress response and inflammatory process, therefore, do not expect to see an improvement in this lab value during acute hospitalization. When a patient presents with low albumin, it is likely skewed due to the acute inflammatory response.  Unless it is suspected that patient had poor PO intake or malnutrition prior to admission, then RD should not be consulted solely for low albumin. Note that low albumin is no longer used to diagnose malnutrition; [place] uses the new malnutrition guidelines published by the American Society for Parenteral and Enteral Nutrition (A.S.P.E.N.) and the Academy of Nutrition and Dietetics (AND).    Labs reviewed: Na:127, Mg: 1.6.   Diet Order:  Diet regular Room service appropriate? Yes; Fluid consistency: Thin; Fluid restriction: 2000 mL Fluid  Skin:  Reviewed, no issues  Last BM:  06/22/17  Height:   Ht Readings from Last 1 Encounters:  06/21/17 5\' 8"  (1.727 m)    Weight:   Wt Readings from Last 1 Encounters:  06/22/17 170 lb (77.1 kg)    Ideal Body Weight:  70 kg  BMI:  Body mass index is 25.85 kg/m.  Estimated Nutritional Needs:   Kcal:  1600-1800  Protein:  80-95 grams  Fluid:  1.6-1.8 L  EDUCATION NEEDS:   Education needs addressed  Olivine Hiers A. Jimmye Norman, RD, LDN, CDE Pager: [phone] After hours Pager: [phone]

## 2017-06-22 NOTE — Progress Notes (Signed)
New Admission Note:   Arrival Method: Bed Mental Orientation: A&O X4 Telemetry: Initiated Assessment: Completed Skin: See flowsheets IV:  Clean, Dry, Intact Pain: Denies Safety Measures: Safety Fall Prevention Plan has been given, discussed and signed Admission: Completed Unit Orientation: Patient has been orientated to the room, unit and staff.  Family: daughter at bedside.   Orders have been reviewed and implemented. Will continue to monitor the patient. Call light has been placed within reach and bed alarm has been activated.    Dixie Dials RN, BSN

## 2017-06-23 LAB — BASIC METABOLIC PANEL
Anion gap: 12 (ref 5–15)
BUN: 22 mg/dL — ABNORMAL HIGH (ref 6–20)
CHLORIDE: 86 mmol/L — AB (ref 101–111)
CO2: 31 mmol/L (ref 22–32)
Calcium: 9 mg/dL (ref 8.9–10.3)
Creatinine, Ser: 1.13 mg/dL (ref 0.61–1.24)
GFR calc non Af Amer: 57 mL/min — ABNORMAL LOW (ref >60)
Glucose, Bld: 90 mg/dL (ref 65–99)
POTASSIUM: 3.9 mmol/L (ref 3.5–5.1)
SODIUM: 129 mmol/L — AB (ref 135–145)

## 2017-06-23 LAB — GLUCOSE, CAPILLARY: Glucose-Capillary: 158 mg/dL — ABNORMAL HIGH (ref 65–99)

## 2017-06-23 LAB — CBC
HEMATOCRIT: 31.2 % — AB (ref 39.0–52.0)
HEMOGLOBIN: 10.6 g/dL — AB (ref 13.0–17.0)
MCH: 31.5 pg (ref 26.0–34.0)
MCHC: 34 g/dL (ref 30.0–36.0)
MCV: 92.6 fL (ref 78.0–100.0)
Platelets: 228 10*3/uL (ref 150–400)
RBC: 3.37 MIL/uL — AB (ref 4.22–5.81)
RDW: 14.5 % (ref 11.5–15.5)
WBC: 9.8 10*3/uL (ref 4.0–10.5)

## 2017-06-23 LAB — MAGNESIUM: MAGNESIUM: 2 mg/dL (ref 1.7–2.4)

## 2017-06-23 MED ORDER — OXYCODONE HCL 5 MG PO TABS
5.0000 mg | ORAL_TABLET | Freq: Four times a day (QID) | ORAL | Status: DC | PRN
  Administered 2017-06-23 – 2017-06-24 (×3): 5 mg via ORAL
  Filled 2017-06-23 (×4): qty 1

## 2017-06-23 NOTE — Care Management Note (Signed)
Case Management Note Marvetta Gibbons RN, BSN Unit 4E-Case Manager-- 6E coverage [phone]  Patient Details  Name: Kyle Armstrong MRN: [id number] Date of Birth: 04-23-1930  Subjective/Objective:  Pt admitted with vol. Overload -HF with afib                Action/Plan: PTA pt lived at home alone, has children that live close by. Pt was independent and reports that he was driving PTA and still working part time 2day/week. Referral for HF HH needs received- if pt continues to work part time post discharge would not be home bound to qualify for [facility] services- PT/OT evals ordered- SNF vs HH pending pt's progress- CM to continue to follow for d/c needs   Expected Discharge Date:                 Expected Discharge Plan:  [place]  In-House Referral:     Discharge planning Services  CM Consult  Post Acute Care Choice:    Choice offered to:     DME Arranged:    DME Agency:     HH Arranged:    [place] Agency:     Status of Service:  In process, will continue to follow  If discussed at Long Length of Stay Meetings, dates discussed:    Discharge Disposition:   Additional Comments:  Dawayne Patricia, RN 06/23/2017, 11:38 AM

## 2017-06-23 NOTE — Evaluation (Addendum)
Occupational Therapy Evaluation Patient Details Name: Kyle Armstrong MRN: [id number] DOB: 1930/07/07 Today's Date: 06/23/2017    History of Present Illness 83 y.o. male with medical history significant of hyperlipidemia, GERD, hypothyroidism, pernicious anemia, and SVT, gastric cancer (s/p of radiation, chemotherapy, gastrectomy), BPH, sarcoidosis of lung, anemia, who presents to the ED with SOB and leg edema   Clinical Impression   This 83 y/o M presents with the above. At baseline Pt is mod independent with functional mobility using SPC, Pt reports he is mod independent with ADLs, however chart review indicates he receives some assist for ADL completion. Pt reports he was driving and working at [employer] 2 days/week. Pt lives alones, has 2 children who live behind his home and next door. Pt currently requires MinA for functional mobility using RW and ModA for LB ADLs. Pt will benefit from continued acute OT services and feel Pt will benefit from post acute OT services prior to return home to maximize Pt's safety and independence with ADLs and functional mobility.     Follow Up Recommendations  SNF;Supervision/Assistance - 24 hour;Other (comment) (pending progress, may be able to return home with [facility])    Equipment Recommendations  Other (comment) (TBD in next venue of care )           Precautions / Restrictions Precautions Precautions: Fall Restrictions Weight Bearing Restrictions: No      Mobility Bed Mobility Overal bed mobility: Needs Assistance Bed Mobility: Supine to Sit     Supine to sit: Min assist     General bed mobility comments: minA for trunk support when coming into upright position   Transfers Overall transfer level: Needs assistance Equipment used: Rolling walker (2 wheeled) Transfers: Sit to/from Stand Sit to Stand: Min assist         General transfer comment: Assist to rise and steady; verbal cues for hand placement    Balance Overall balance  assessment: Needs assistance Sitting-balance support: Feet supported;Single extremity supported Sitting balance-Leahy Scale: Fair     Standing balance support: Bilateral upper extremity supported;During functional activity Standing balance-Leahy Scale: Poor Standing balance comment: relies on UE support during mobility; external support from therapist required to maintain standing balance while standing at sink to wash hands (noted posterior lean)                            ADL either performed or assessed with clinical judgement   ADL Overall ADL's : Needs assistance/impaired Eating/Feeding: Set up;Sitting   Grooming: Wash/dry hands;Minimal assistance;Standing Grooming Details (indicate cue type and reason): MinA to steady in standing  Upper Body Bathing: Min guard;Sitting   Lower Body Bathing: Minimal assistance;Sit to/from stand   Upper Body Dressing : Min guard;Sitting   Lower Body Dressing: Sit to/from stand;Moderate assistance Lower Body Dressing Details (indicate cue type and reason): Pt demonstrates figure 4 technique Toilet Transfer: Minimal assistance;Ambulation;RW;Comfort height toilet;Grab bars   Toileting- Clothing Manipulation and Hygiene: Minimal assistance;Sit to/from stand       Functional mobility during ADLs: Minimal assistance;Rolling walker General ADL Comments: Pt with posterior lean during standing grooming ADLs, requires external support from therapist to maintain standing balance     Vision Baseline Vision/History: Wears glasses                  Pertinent Vitals/Pain Pain Assessment: No/denies pain     Hand Dominance     Extremity/Trunk Assessment Upper Extremity Assessment Upper Extremity Assessment:  Generalized weakness   Lower Extremity Assessment Lower Extremity Assessment: Defer to PT evaluation   Cervical / Trunk Assessment Cervical / Trunk Assessment: Kyphotic   Communication Communication Communication: HOH    Cognition Arousal/Alertness: Awake/alert Behavior During Therapy: WFL for tasks assessed/performed Overall Cognitive Status: Within Functional Limits for tasks assessed                                                      Home Living Family/patient expects to be discharged to:: Private residence Living Arrangements: Alone (son lives behind Pt, daughter lives next door) Available Help at Discharge: Family Type of Home: House Home Access: Stairs to enter Technical brewer of Steps: 1 step from Victor: One level     Bathroom Shower/Tub: Occupational psychologist: Standard     Home Equipment: Environmental consultant - 2 wheels;Cane - single point;Grab bars - toilet;Grab bars - tub/shower;Shower seat          Prior Functioning/Environment Level of Independence: Independent with assistive device(s)        Comments: Pt reports he uses cane for mobility, per Pt report he was completing ADLs with mod indepenedence (however chart review states Pt recevies assist for ADLs); Pt reports he works at [employer] 2 days/week and is currently driving         OT Problem List: Decreased strength;Decreased activity tolerance;Impaired balance (sitting and/or standing)      OT Treatment/Interventions: Self-care/ADL training;Therapeutic activities;DME and/or AE instruction;Balance training;Therapeutic exercise;Patient/family education    OT Goals(Current goals can be found in the care plan section) Acute Rehab OT Goals Patient Stated Goal: regain independence OT Goal Formulation: With patient Time For Goal Achievement: 07/07/17 Potential to Achieve Goals: Good  OT Frequency: Min 2X/week                             AM-PAC PT "6 Clicks" Daily Activity     Outcome Measure Help from another person eating meals?: A Little Help from another person taking care of personal grooming?: A Little Help from another person toileting, which includes using  toliet, bedpan, or urinal?: A Little Help from another person bathing (including washing, rinsing, drying)?: A Little Help from another person to put on and taking off regular upper body clothing?: A Little Help from another person to put on and taking off regular lower body clothing?: A Lot 6 Click Score: 17   End of Session Equipment Utilized During Treatment: Gait belt;Rolling walker Nurse Communication: Mobility status  Activity Tolerance: Patient tolerated treatment well Patient left: in chair;with call bell/phone within reach;with chair alarm set  OT Visit Diagnosis: Unsteadiness on feet (R26.81);Muscle weakness (generalized) (M62.81)                Time: 4081-4481 OT Time Calculation (min): 31 min Charges:  OT General Charges $OT Visit: 1 Procedure OT Evaluation $OT Eval Low Complexity: 1 Procedure OT Treatments $Self Care/Home Management : 8-22 mins G-Codes:     Lou Cal, OT Pager [phone] 06/23/2017   Raymondo Band 06/23/2017, 11:12 AM

## 2017-06-23 NOTE — Evaluation (Signed)
Physical Therapy Evaluation Patient Details Name: Kyle Armstrong MRN: [id number] DOB: 01-06-1930 Today's Date: 06/23/2017   History of Present Illness  83 y.o. male with medical history significant of hyperlipidemia, GERD, hypothyroidism, pernicious anemia, and SVT, gastric cancer (s/p of radiation, chemotherapy, gastrectomy), BPH, sarcoidosis of lung, anemia, who presents to the ED with SOB and leg edema   Clinical Impression  Pt presents with decreased strength secondary to acute respiratory failure with hypoxia. Pt was awake, alert, and eager to walk. Pt required min guard for sit/stand and ambulation. Pt desaturated to 87% O2 when beginning exercise and supplemental O2 was initiated at 2L. During ambulation, O2 saturation ranged between 90 and 94% on 2L O2. After resting in recliner for 2 minutes on room air, O2 was at 99-100%. Pt will continue to benefit from skilled physical therapy to increase strength and precaution awareness to improve safety and functional mobility.     Follow Up Recommendations Home health PT;Supervision/Assistance - 24 hour    Equipment Recommendations  Rolling walker with 5" wheels , Consider supplemental oxygen for home; will continue to assess   Recommendations for Other Services       Precautions / Restrictions Precautions Precautions: Fall Restrictions Weight Bearing Restrictions: No      Mobility  Bed Mobility Overal bed mobility: Modified Independent Bed Mobility: Supine to Sit     Supine to sit: Modified independent (Device/Increase time)     General bed mobility comments: Increased time to come to sitting. HOB raised  Transfers Overall transfer level: Needs assistance Equipment used: Rolling walker (2 wheeled) Transfers: Sit to/from Stand Sit to Stand: Min guard         General transfer comment: verbal cues for hand placement when sit to stand  Ambulation/Gait Ambulation/Gait assistance: Min guard Ambulation Distance (Feet):  100 Feet Assistive device: Rolling walker (2 wheeled) Gait Pattern/deviations: Decreased step length - right;Decreased step length - left;Narrow base of support;Step-through pattern   Gait velocity interpretation: at or above normal speed for age/gender General Gait Details: Cues for upright posture. Cues for body position in RW. Required supplemental O2 during ambulation  Stairs            Wheelchair Mobility    Modified Rankin (Stroke Patients Only)       Balance Overall balance assessment: Modified Independent Sitting-balance support: No upper extremity supported;Feet supported Sitting balance-Leahy Scale: Fair     Standing balance support: No upper extremity supported Standing balance-Leahy Scale: Fair Standing balance comment: Stood with posterior lean. Removed both hands from walker and maintained balance                             Pertinent Vitals/Pain Pain Assessment: No/denies pain    Home Living Family/patient expects to be discharged to:: Private residence Living Arrangements: Alone Available Help at Discharge: Family ([place] live next door and in the house behind) Type of Home: House Home Access: Stairs to enter   [employer] of Steps: 1 step from [place]: One [place]: Environmental consultant - 2 wheels;Cane - single point;Grab bars - toilet;Grab bars - tub/shower;Shower seat      Prior Function Level of Independence: Independent with assistive device(s)         Comments: Pt reports he uses cane for mobility; Pt reports he works at [employer] 2 days/week and is currently driving     Hand Dominance        Extremity/Trunk  Assessment   Upper Extremity Assessment Upper Extremity Assessment: Generalized weakness    Lower Extremity Assessment Lower Extremity Assessment: Generalized weakness    Cervical / Trunk Assessment Cervical / Trunk Assessment: Kyphotic  Communication   Communication: HOH  Cognition  Arousal/Alertness: Awake/alert Behavior During Therapy: WFL for tasks assessed/performed Overall Cognitive Status: Within Functional Limits for tasks assessed                                        General Comments General comments (skin integrity, edema, etc.): Abrasion near antecubital space, skin appeared fragile. Pt noted bruising     Exercises General Exercises - Lower Extremity Ankle Circles/Pumps: Both;10 reps;Seated (Seated in recliner)   Assessment/Plan    PT Assessment Patient needs continued PT services  PT Problem List Decreased strength;Decreased activity tolerance;Decreased mobility;Decreased knowledge of use of DME;Decreased safety awareness;Decreased knowledge of precautions;Decreased skin integrity       PT Treatment Interventions DME instruction;Gait training;Stair training;Functional mobility training;Therapeutic activities;Therapeutic exercise    PT Goals (Current goals can be found in the Care Plan section)  Acute Rehab PT Goals Patient Stated Goal: Get home PT Goal Formulation: With patient Potential to Achieve Goals: Good    Frequency Min 3X/week   Barriers to discharge        Co-evaluation               AM-PAC PT "6 Clicks" Daily Activity  Outcome Measure Difficulty turning over in bed (including adjusting bedclothes, sheets and blankets)?: None Difficulty moving from lying on back to sitting on the side of the bed? : None Difficulty sitting down on and standing up from a chair with arms (e.g., wheelchair, bedside commode, etc,.)?: None Help needed moving to and from a bed to chair (including a wheelchair)?: None Help needed walking in hospital room?: None Help needed climbing 3-5 steps with a railing? : A Lot 6 Click Score: 22    End of Session Equipment Utilized During Treatment: Gait belt Activity Tolerance: Patient tolerated treatment well Patient left: in chair;with call bell/phone within reach;with chair alarm  set Nurse Communication: Other (comment) (O2 sats at rest, during exercise, and with 2L of O2) PT Visit Diagnosis: Muscle weakness (generalized) (M62.81)    Time: 1448-1856 PT Time Calculation (min) (ACUTE ONLY): 34 min   Charges:         PT G Codes:        Sandria Bales, SPT Acute Rehabilitation Services Office: [phone]  Sandria Bales 06/23/2017, 3:18 PM

## 2017-06-23 NOTE — Consult Note (Signed)
[facility] CM Primary Care Navigator  06/23/2017  Kyle Armstrong 10-Nov-1930 [id number]   Met with patient, son [name]) and daughter Margarita Grizzle) at the bedside to identify possibledischarge needs. Patient reports having difficulty breathingthat hadled to this admission. Patient end[facility] as hisprimary [facility] provider.   Patient [facility]using [employer] in [place] delivery serviceto obtain medications without difficulty.  Hemanages his medications at home with use of "pill box" system filled weekly.  Patient was driving prior to admission and still working twice a week [employer]). Patient's daughter provides transportation to hisdoctors' appointments.  [employer] transportation benefit discussed with patient and children and they expressed gratitude for information provided to them.    Patient lives alone and independent with self care at home. Son lives next door (few steps away) to him and daughter lives beside his house. Both are able to provide assistance with his needs at home as stated.   Discharge plan ispending patient's progress, therapy evaluation/ recommendation and physician order.   Patient and family voiced understanding to call primary care provider's office when he returns back home, for a post discharge follow-up appointment within a week or sooner if needs arise.Patient letter (with PCP's contact number) wasprovided as theirreminder.  Explained to patient and family regarding [facility] CM services available for further healthmanagement (particularly HF). Patientand family voiced understanding to seekreferral to [facility] care managementservicesfrom primary care provider (on follow-up visit) as deemed necessary and appropriatefor services.   [facility] care management information provided for any future needs that he may have   For questions, please  contact:  Dannielle Huh, BSN, RN- [facility] Primary Care Navigator  Telephone: [phone] [place]

## 2017-06-23 NOTE — Progress Notes (Signed)
PROGRESS NOTE  Kyle Armstrong ZCH:[id number] DOB: 03-Sep-1930 DOA: 06/21/2017 PCP: Lawerance Cruel, MD  HPI/Recap of past 68 hours: 83 year old male past history of hypothyroidism, gastric cancer status post radiation, chemotherapy and surgery plus sarcoidosis of the lung admitted on 7/29 with shortness of breath and lower extremity edema and found to have sodium of 122, acute hypoxic respiratory failure felt to be secondary to CHF. Patient started on diuretics and to date has diuresed over 5 L. Sodium has been slowly trending upwards and today at 129.  Patient doing okay. Only complaint is of some chronic lower back pain.  Assessment/Plan: Principal Problem:   Acute respiratory failure with hypoxia (HCC) secondary to acute on chronic diastolic heart failure: Echocardiogram notes preserved ejection fraction, but unable to comment on diastolic dysfunction and evidence of right heart failure. However patient responding to Lasix. Continue Lasix for 1 more day. Noted slight bump in creatinine. 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  Back pain: Chronic. No pain medications listed on home medicine regimen. Start low-dose OxyIR when necessary    Severe protein-calorie malnutrition ([place]): Increase nutrient needs related to chronic sarcoidosis. Seen by nutrition. Start on boost plus twice a day.   Sarcoidosis of lung ([place]): Has never been treated for this issue or seen a pulmonologist. Right heart failure maybe cor pulmonale due to this fibrotic lung disease although no evidence noted on echo. Given advanced age and symptoms improving, will not pursue further at this time.  Atrial fibrillation with RVR: Status post ablation for SVT in 2013. Has been  staying in normal sinus rhythm.  Moderate aortic stenosis functional bicuspid valve: Incidentally noted on echocardiogram. Given her other comorbidities and advanced age, not a candidate for corrective surgery    Hyponatremia: Secondary to volume overload. Improving with diuresis.   Code Status: Full code   Family Communication: Left message for son  Disposition Plan: Patient currently lives at home with son, who is not there 24 hours a day. Right now PT and OT are recommending skilled nursing versus 21st supervision. However they do state that he may be able to progress to the point where he only needs home health PT and OT    Consultants:  None   Procedures:  Echocardiogram done 7/29   Antimicrobials:  None  DVT prophylaxis: Lovenox   Objective: Vitals:   06/22/17 1500 06/22/17 1740 06/22/17 2048 06/23/17 0444  BP: 107/74 116/70 122/77 131/83  Pulse: 78 79 80 78  Resp: 18 18 19 20   Temp: (!) 97.3 F (36.3 C) 98 F (36.7 C) 99 F (37.2 C) 98.1 F (36.7 C)  TempSrc: Oral Oral Oral Oral  SpO2: 98% 93% 93%   Weight:   77.2 kg (170 lb 3.1 oz)   Height:        Intake/Output Summary (Last 24 hours) at 06/23/17 1430 Last data filed at 06/23/17 0500  Gross per 24 hour  Intake              180 ml  Output             1000 ml  Net             -820 ml   Filed Weights   06/21/17 0803 06/22/17 0500 06/22/17  2048  Weight: 79.9 kg (176 lb 3.2 oz) 77.1 kg (170 lb) 77.2 kg (170 lb 3.1 oz)    Exam:   General:  Alert and oriented 3, heart of hearing  HEENT: Normocephalic [facility] is slightly dry  Cardiovascular: Regular rate and rhythm, S1-S2   Respiratory: Clear to auscultation bilaterally   Abdomen: Soft, nontender, nondistended, positive bowel sounds   Musculoskeletal: No clubbing or cyanosis or edema   Skin: No skin breaks, tears or lesions  Psychiatry: Patient appropriate, no evidence of psychoses    Data Reviewed: CBC:  Recent  Labs Lab 06/21/17 0121 06/23/17 0430  WBC 5.2 9.8  NEUTROABS 3.7  --   HGB 9.8* 10.6*  HCT 29.1* 31.2*  MCV 92.1 92.6  PLT 200 347   Basic Metabolic Panel:  Recent Labs Lab 06/21/17 0910 06/21/17 1131 06/21/17 2012 06/22/17 0305 06/23/17 0430  NA 124* 123* 123* 127* 129*  K 3.6 3.8 3.5 3.6 3.9  CL 89* 89* 87* 90* 86*  CO2 23 25 26 29 31   GLUCOSE 223* 200* 171* 109* 90  BUN 12 12 12 14  22*  CREATININE 0.86 0.94 0.92 0.90 1.13  CALCIUM 8.6* 8.7* 8.9 8.6* 9.0  MG  --   --   --  1.6* 2.0   GFR: Estimated Creatinine Clearance: 46.2 mL/min (by C-G formula based on SCr of 1.13 mg/dL). Liver Function Tests:  Recent Labs Lab 06/21/17 0121  AST 18  ALT 28  ALKPHOS 82  BILITOT 0.7  PROT 5.3*  ALBUMIN 3.3*   No results for input(s): LIPASE, AMYLASE in the last 168 hours. No results for input(s): AMMONIA in the last 168 hours. Coagulation Profile: No results for input(s): INR, PROTIME in the last 168 hours. Cardiac Enzymes:  Recent Labs Lab 06/21/17 0634 06/21/17 1131 06/21/17 1827  TROPONINI <0.03 <0.03 <0.03   BNP (last 3 results) No results for input(s): PROBNP in the last 8760 hours. HbA1C: No results for input(s): HGBA1C in the last 72 hours. CBG:  Recent Labs Lab 06/22/17 2029  GLUCAP 125*   Lipid Profile:  Recent Labs  06/22/17 0305  CHOL 126  HDL 68  LDLCALC 52  TRIG 32  CHOLHDL 1.9   Thyroid Function Tests:  Recent Labs  06/21/17 0634  TSH 1.416   Anemia Panel: No results for input(s): VITAMINB12, FOLATE, FERRITIN, TIBC, IRON, RETICCTPCT in the last 72 hours. Urine analysis:    Component Value Date/Time   COLORURINE YELLOW 02/09/2014 [place] 02/09/2014 1317   LABSPEC 1.012 02/09/2014 1317   PHURINE 6.5 02/09/2014 1317   GLUCOSEU NEGATIVE 02/09/2014 1317   HGBUR NEGATIVE 02/09/2014 1317   BILIRUBINUR NEGATIVE 02/09/2014 1317   KETONESUR NEGATIVE 02/09/2014 1317   PROTEINUR NEGATIVE 02/09/2014 1317    UROBILINOGEN 1.0 02/09/2014 1317   NITRITE NEGATIVE 02/09/2014 1317   LEUKOCYTESUR NEGATIVE 02/09/2014 1317   Sepsis Labs: @LABRCNTIP (procalcitonin:4,lacticidven:4)  ) Recent Results (from the past 240 hour(s))  Respiratory Panel by PCR     Status: None   Collection Time: 06/21/17  9:59 AM  Result Value Ref Range Status   Adenovirus NOT DETECTED NOT DETECTED Final   Coronavirus 229E NOT DETECTED NOT DETECTED Final   Coronavirus HKU1 NOT DETECTED NOT DETECTED Final   Coronavirus NL63 NOT DETECTED NOT DETECTED Final   Coronavirus OC43 NOT DETECTED NOT DETECTED Final   Metapneumovirus NOT DETECTED NOT DETECTED Final   Rhinovirus / Enterovirus NOT DETECTED NOT DETECTED Final   Influenza A NOT  DETECTED NOT DETECTED Final   Influenza B NOT DETECTED NOT DETECTED Final   Parainfluenza Virus 1 NOT DETECTED NOT DETECTED Final   Parainfluenza Virus 2 NOT DETECTED NOT DETECTED Final   Parainfluenza Virus 3 NOT DETECTED NOT DETECTED Final   Parainfluenza Virus 4 NOT DETECTED NOT DETECTED Final   Respiratory Syncytial Virus NOT DETECTED NOT DETECTED Final   Bordetella pertussis NOT DETECTED NOT DETECTED Final   Chlamydophila pneumoniae NOT DETECTED NOT DETECTED Final   Mycoplasma pneumoniae NOT DETECTED NOT DETECTED Final  MRSA PCR Screening     Status: None   Collection Time: 06/21/17  9:59 AM  Result Value Ref Range Status   MRSA by PCR NEGATIVE NEGATIVE Final    Comment:        The GeneXpert MRSA Assay (FDA approved for NASAL specimens only), is one component of a comprehensive MRSA colonization surveillance program. It is not intended to diagnose MRSA infection nor to guide or monitor treatment for MRSA infections.       Studies: No results found.  Scheduled Meds: . aspirin EC  81 mg Oral Daily  . celecoxib  200 mg Oral BID  . enoxaparin (LOVENOX) injection  40 mg Subcutaneous Q24H  . ferrous sulfate  325 mg Oral TID WC  . furosemide  60 mg Intravenous TID  . lactose  free nutrition  237 mL Oral BID BM  . levothyroxine  50 mcg Oral QAC breakfast  . metoprolol tartrate  12.5 mg Oral BID  . predniSONE  40 mg Oral Q breakfast  . simvastatin  40 mg Oral QPM  . tamsulosin  0.4 mg Oral Daily  . cyanocobalamin  1,000 mcg Oral Daily    Continuous Infusions:   LOS: 2 days     Annita Brod, MD [facility] Pager [phone]  If 7PM-7AM, please contact night-coverage [email or web address] Password [email or web address] 06/23/2017, 2:30 PM

## 2017-06-24 DIAGNOSIS — E785 Hyperlipidemia, unspecified: Secondary | ICD-10-CM

## 2017-06-24 DIAGNOSIS — C163 Malignant neoplasm of pyloric antrum: Secondary | ICD-10-CM

## 2017-06-24 DIAGNOSIS — I509 Heart failure, unspecified: Secondary | ICD-10-CM

## 2017-06-24 DIAGNOSIS — J9601 Acute respiratory failure with hypoxia: Secondary | ICD-10-CM

## 2017-06-24 LAB — GLUCOSE, CAPILLARY
GLUCOSE-CAPILLARY: 91 mg/dL (ref 65–99)
Glucose-Capillary: 177 mg/dL — ABNORMAL HIGH (ref 65–99)

## 2017-06-24 LAB — BASIC METABOLIC PANEL
ANION GAP: 9 (ref 5–15)
BUN: 30 mg/dL — AB (ref 6–20)
CHLORIDE: 84 mmol/L — AB (ref 101–111)
CO2: 35 mmol/L — ABNORMAL HIGH (ref 22–32)
Calcium: 8.6 mg/dL — ABNORMAL LOW (ref 8.9–10.3)
Creatinine, Ser: 1.14 mg/dL (ref 0.61–1.24)
GFR calc Af Amer: >60 mL/min (ref >60)
GFR calc non Af Amer: 57 mL/min — ABNORMAL LOW (ref >60)
Glucose, Bld: 95 mg/dL (ref 65–99)
POTASSIUM: 2.9 mmol/L — AB (ref 3.5–5.1)
SODIUM: 128 mmol/L — AB (ref 135–145)

## 2017-06-24 LAB — MAGNESIUM: MAGNESIUM: 1.9 mg/dL (ref 1.7–2.4)

## 2017-06-24 LAB — POTASSIUM: POTASSIUM: 4.5 mmol/L (ref 3.5–5.1)

## 2017-06-24 MED ORDER — FUROSEMIDE 40 MG PO TABS: 40.0000 mg | ORAL_TABLET | Freq: Every day | ORAL | Status: DC

## 2017-06-24 MED ORDER — ASPIRIN 81 MG PO TBEC: 81.0000 mg | DELAYED_RELEASE_TABLET | Freq: Every day | ORAL | 3 refills | Status: DC

## 2017-06-24 MED ORDER — DM-GUAIFENESIN ER 30-600 MG PO TB12: 1.0000 | ORAL_TABLET | Freq: Two times a day (BID) | ORAL | 0 refills | Status: DC | PRN

## 2017-06-24 MED ORDER — POTASSIUM CHLORIDE CRYS ER 20 MEQ PO TBCR: 20.0000 meq | EXTENDED_RELEASE_TABLET | Freq: Every day | ORAL | 1 refills | Status: DC

## 2017-06-24 MED ORDER — POTASSIUM CHLORIDE 20 MEQ PO PACK: 40.0000 meq | PACK | Freq: Two times a day (BID) | ORAL | Status: DC

## 2017-06-24 MED ORDER — POTASSIUM CHLORIDE 10 MEQ/100ML IV SOLN
10.0000 meq | INTRAVENOUS | Status: AC
  Administered 2017-06-24 (×2): 10 meq via INTRAVENOUS
  Filled 2017-06-24 (×2): qty 100

## 2017-06-24 MED ORDER — POTASSIUM CHLORIDE CRYS ER 20 MEQ PO TBCR: 40.0000 meq | EXTENDED_RELEASE_TABLET | Freq: Every day | ORAL | Status: DC

## 2017-06-24 MED ORDER — TRAMADOL HCL 50 MG PO TABS: 50.0000 mg | ORAL_TABLET | Freq: Two times a day (BID) | ORAL | 0 refills | Status: DC | PRN

## 2017-06-24 MED ORDER — POTASSIUM CHLORIDE 10 MEQ/100ML IV SOLN
10.0000 meq | Freq: Once | INTRAVENOUS | Status: AC
  Administered 2017-06-24: 10 meq via INTRAVENOUS
  Filled 2017-06-24: qty 100

## 2017-06-24 MED ORDER — METOPROLOL TARTRATE 25 MG PO TABS: 12.5000 mg | ORAL_TABLET | Freq: Two times a day (BID) | ORAL | 3 refills | Status: DC

## 2017-06-24 MED ORDER — PREDNISONE 10 MG PO TABS: ORAL_TABLET | ORAL | 0 refills | Status: DC

## 2017-06-24 MED ORDER — FUROSEMIDE 40 MG PO TABS: 40.0000 mg | ORAL_TABLET | Freq: Every day | ORAL | 2 refills | Status: DC

## 2017-06-24 MED ORDER — FUROSEMIDE 40 MG PO TABS
40.0000 mg | ORAL_TABLET | Freq: Two times a day (BID) | ORAL | Status: DC
  Administered 2017-06-24: 40 mg via ORAL
  Filled 2017-06-24: qty 1

## 2017-06-24 MED ORDER — ALBUTEROL SULFATE HFA 108 (90 BASE) MCG/ACT IN AERS: 2.0000 | INHALATION_SPRAY | Freq: Four times a day (QID) | RESPIRATORY_TRACT | 2 refills | Status: DC | PRN

## 2017-06-24 MED ORDER — POTASSIUM CHLORIDE CRYS ER 20 MEQ PO TBCR
40.0000 meq | EXTENDED_RELEASE_TABLET | ORAL | Status: AC
  Administered 2017-06-24 (×2): 40 meq via ORAL
  Filled 2017-06-24 (×2): qty 2

## 2017-06-24 MED ORDER — POTASSIUM CHLORIDE 20 MEQ PO PACK
40.0000 meq | PACK | ORAL | Status: DC
  Filled 2017-06-24 (×2): qty 2

## 2017-06-24 MED ORDER — POTASSIUM CHLORIDE CRYS ER 20 MEQ PO TBCR: 20.0000 meq | EXTENDED_RELEASE_TABLET | Freq: Every day | ORAL | Status: DC

## 2017-06-24 NOTE — Progress Notes (Signed)
Patient discharged to home, AVS reviewed with patient and son, prscriptions provided, IV removed and tele returned, left floor via wheelchair with family and staff

## 2017-06-24 NOTE — Progress Notes (Addendum)
CM called Butch Penny with [place] for home oxygen for home today; Also need documentation of qualifying 02 saturation for insurance approval for home oxygen; Aneta Mins [phone]

## 2017-06-24 NOTE — Progress Notes (Signed)
Critical Lab Potassium-2.9 MD made aware.

## 2017-06-24 NOTE — Progress Notes (Signed)
SATURATION QUALIFICATIONS: (This note is used to comply with regulatory documentation for home oxygen)  Patient Saturations on Room Air at Rest = 98%  Patient Saturations on Room Air while Ambulating = 87%  Patient Saturations on 2L Liters of oxygen while Ambulating = 94%  Please briefly explain why patient needs home oxygen: Dyspnea on exertion r/t diagnosis of CHF

## 2017-06-24 NOTE — Discharge Summary (Addendum)
Physician Discharge Summary   Patient ID: Kyle Armstrong MRN: [id number] DOB/AGE: 1930/09/16 83 y.o.  Admit date: 06/21/2017 Discharge date: 06/24/2017  Primary Care Physician:  Lawerance Cruel, MD  Discharge Diagnoses:   . Acute respiratory failure with hypoxia ([place]) . Acute on chronic diastolic CHF . Pernicious anemia . 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 Hyperlipidemia . Hypothyroidism . Severe protein-calorie malnutrition ([place]) . Hypokalemia . Sarcoidosis of lung ([place]) . Hyponatremia   Consults:  None  Recommendations for Outpatient Follow-up:  1. Home health PT OT, home health RN, home O2, rolling walker was arranged by case management 2. Please repeat BMET at next visit 3. Patient was discharged on Lasix 40 mg daily with potassium 20 meq daily. He will be seen by cardiology outpatient further adjustment will be done by cardiology. Please check renal function for creatinine and potassium. 4. Patient qualified for home O2 2L due to congestive heart failure   DIET: Heart healthy diet    Allergies:   Allergies  Allergen Reactions  . Lipitor [Atorvastatin] Other (See Comments)    Leg cramps  . Amoxicillin Rash    rash  . Augmentin [Amoxicillin-Pot Clavulanate] Rash     DISCHARGE MEDICATIONS: Current Discharge Medication List    START taking these medications   Details  albuterol (PROVENTIL HFA;VENTOLIN HFA) 108 (90 Base) MCG/ACT inhaler Inhale 2 puffs into the lungs every 6 (six) hours as needed for wheezing or shortness of breath. Qty: 1 Inhaler, Refills: 2    aspirin EC 81 MG EC tablet Take 1 tablet (81 mg total) by mouth daily. Qty: 30 tablet, Refills: 3    dextromethorphan-guaiFENesin (MUCINEX DM) 30-600 MG 12hr tablet Take 1 tablet by mouth 2 (two) times daily as needed for cough. Qty: 30 tablet, Refills: 0    furosemide (LASIX) 40 MG tablet Take 1 tablet (40 mg total) by mouth daily. Qty: 30  tablet, Refills: 2    metoprolol tartrate (LOPRESSOR) 25 MG tablet Take 0.5 tablets (12.5 mg total) by mouth 2 (two) times daily. Qty: 60 tablet, Refills: 3    potassium chloride SA (K-DUR,KLOR-CON) 20 MEQ tablet Take 1 tablet (20 mEq total) by mouth daily. Qty: 30 tablet, Refills: 1    predniSONE (DELTASONE) 10 MG tablet Prednisone dosing: Take  Prednisone 30mg  (3 tabs) x 3 days, then taper to 20mg  (2 tabs) x 3days, then 10mg  (1 tab) x 3days, then OFF. Qty: 18 tablet, Refills: 0    traMADol (ULTRAM) 50 MG tablet Take 1 tablet (50 mg total) by mouth every 12 (twelve) hours as needed for severe pain. Qty: 20 tablet, Refills: 0      CONTINUE these medications which have NOT CHANGED   Details  acetaminophen (TYLENOL) 500 MG tablet Take 1,000 mg by mouth every 6 (six) hours as needed for mild pain or moderate pain.     celecoxib (CELEBREX) 200 MG capsule Take 200 mg by mouth 2 (two) times daily.   Associated Diagnoses: Pernicious anemia    Cyanocobalamin (VITAMIN B-12 IJ) Inject as directed every 30 (thirty) days.     cyanocobalamin 1000 MCG tablet Take 1,000 mcg by mouth daily.   Associated Diagnoses: Pernicious anemia    ferrous sulfate 325 (65 FE) MG EC tablet Take 325 mg by mouth 3 (three) times daily with meals.   Associated Diagnoses: Anemia, unspecified    levothyroxine (SYNTHROID, LEVOTHROID) 50 MCG tablet Take 50 mcg by mouth every morning.    pantoprazole (PROTONIX) 40  MG tablet Take 40 mg by mouth 2 (two) times daily as needed (for acid reflux).     ranitidine (ZANTAC) 150 MG capsule Take 150 mg by mouth as needed for heartburn.     simvastatin (ZOCOR) 40 MG tablet Take 40 mg by mouth every evening.    tamsulosin (FLOMAX) 0.4 MG CAPS capsule Take 0.4 mg by mouth daily.   Associated Diagnoses: Pernicious anemia         Brief H and P: For complete details please refer to admission H and P, but in brief54 year old male past history of hypothyroidism, gastric cancer  status post radiation, chemotherapy and surgery plus sarcoidosis of the lung admitted on 7/29 with shortness of breath and lower extremity edema and found to have sodium of 122, acute hypoxic respiratory failure felt to be secondary to CHF.  Hospital Course:     Acute respiratory failure with hypoxia (HCC) likely secondary to acute on chronic diastolic CHF - Patient presented with 3+ bilateral leg edema, elevated BNP of 843, no infiltrations on chest x-ray. He denied any prior history of CHF. However he did have hypervolemic hyponatremia with sodium of 122 at the time of admission. - Patient was admitted in the stepdown unit and was placed on IV Lasix for diuresis. -There was also question if patient had acute bronchitis causing shortness of breath hence patient was placed on DuoNeb's and Solu-Medrol. Respiratory virus panel was negative.  - Patient was placed on aggressive IV diuresis Lasix 60 mg IV every 8 hours, he has diuresed 8.1 L, weight down frrom 179 to 170lbs.patient was transitioned to oral Lasix.   - He has symptomatically improved , O2 sats 98% on room air at rest  - Patient will be discharged on 40 mg daily oral with potassium 20 mEq daily, cardiology appointment has been scheduled with Ellen Henri on 8/8 at 9:30 AM for follow-up. Further adjustments with Lasix dose will be done by cardiology.  - 2-D echo showed EF of 55-60%, study was not technically sufficient to allow evaluation of LV diastolic dysfunction    hypokalemia  K 2.9 likely due to aggressive diuresis, was replaced. Patient was placed on Lasix 40mg  daily with Kdur 20Meq daily. Recheck BMET in 1 week.  - K at discharge 4.5.     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    Sarcoidosis of lung ([place]) - Patient has not been treated for this in or seen a pulmonologist. Right heart failure may cause cor pulmonale due to the fibrotic lung disease. Given her advanced age and his symptoms are currently improving, will not pursue further at this time. Patient may want to follow pulmonology outpatient if he desires. Will defer to PCP.    Hyponatremia - Likely due to hyper volemic hyponatremia, improving with Lasix diuresis    Day of Discharge BP (!) 100/54 (BP Location: Right Arm)   Pulse (!) 52   Temp 98 F (36.7 C) (Oral)   Resp 19   Ht 5\' 8"  (1.727 m)   Wt 77.4 kg (170 lb 10.2 oz)   SpO2 98%   BMI 25.95 kg/m   Physical Exam: General: Alert and awake oriented x3 not in any acute distress. HEENT: anicteric  sclera, pupils reactive to light and accommodation CVS: S1-S2 clear no murmur rubs or gallops Chest: clear to auscultation bilaterally, no wheezing rales or rhonchi Abdomen: soft nontender, nondistended, normal bowel sounds Extremities: no cyanosis, clubbing or edema noted bilaterally Neuro: Cranial nerves II-XII intact, no focal neurological deficits   The results of significant diagnostics from this hospitalization (including imaging, microbiology, ancillary and laboratory) are listed below for reference.    LAB RESULTS: Basic Metabolic Panel:  Recent Labs Lab 06/23/17 0430 06/24/17 0423 06/24/17 1614  NA 129* 128*  --   K 3.9 2.9* 4.5  CL 86* 84*  --   CO2 31 35*  --   GLUCOSE 90 95  --   BUN 22* 30*  --   CREATININE 1.13 1.14  --   CALCIUM 9.0 8.6*  --   MG 2.0 1.9  --    Liver Function Tests:  Recent Labs Lab 06/21/17 0121  AST 18  ALT 28  ALKPHOS 82  BILITOT 0.7  PROT 5.3*  ALBUMIN 3.3*   No results for input(s): LIPASE, AMYLASE in the last 168 hours. No results for input(s): AMMONIA in the last 168 hours. CBC:  Recent Labs Lab  06/21/17 0121 06/23/17 0430  WBC 5.2 9.8  NEUTROABS 3.7  --   HGB 9.8* 10.6*  HCT 29.1* 31.2*  MCV 92.1 92.6  PLT 200 228   Cardiac Enzymes:  Recent Labs Lab 06/21/17 1131 06/21/17 1827  TROPONINI <0.03 <0.03   BNP: Invalid input(s): POCBNP CBG:  Recent Labs Lab 06/23/17 2146 06/24/17 0756  GLUCAP 158* 91    Significant Diagnostic Studies:  Dg Chest 2 View  Result Date: 06/21/2017 CLINICAL DATA:  Dyspnea and wheezing for 1 day EXAM: CHEST  2 VIEW COMPARISON:  12/25/2013 FINDINGS: Severe fibrotic interstitial changes with some worsening from 12/25/2013. Small pleural effusions bilaterally. No focal airspace consolidation. IMPRESSION: Small pleural effusions. Worsening fibrotic appearing interstitial changes. Electronically Signed   By: Andreas Newport M.D.   On: 06/21/2017 02:19    2D ECHO: Study Conclusions  - Left ventricle: The cavity size was normal. Wall thickness was   increased in a pattern of mild LVH. Systolic function was normal.   The estimated ejection fraction was in the range of 55% to 60%.   Although no diagnostic regional wall motion abnormality was   identified, this possibility cannot be completely excluded on the   basis of this study. The study is not technically sufficient to   allow evaluation of LV diastolic function. - Aortic valve: Moderately to severely calcified annulus.   Functionally bicuspid; moderately calcified leaflets. There was   moderate stenosis. There was mild regurgitation. Mean gradient   (S): 11 mm Hg. Peak gradient (S): 19 mm Hg. VTI ratio of LVOT to   aortic valve: 0.41. Valve area (VTI): 1.28 cm^2. Valve area   (Vmax): 1.29 cm^2. - Mitral valve: Calcified annulus. Mildly calcified leaflets .   There was mild to moderate regurgitation. - Left atrium: The atrium was severely dilated. - Atrial septum: No defect or patent foramen ovale was identified. - Tricuspid valve: There was mild regurgitation. - Pericardium,  extracardiac: There was no pericardial effusion.  Impressions:  - Mild LVH with LVEF 55-60%. Indeterminate diastolic function.   Severe left atrial enlargement. Mitral annular calcification with   mildly calcified leaflets and mild to moderate mitral   regurgitation. Aortic valve appears to be bicuspid with moderate   calcification and moderate to severe annular calcification. There  is moderate aortic stenosis and mild aortic regurgitation. Mild   tricuspid regurgitation.   Disposition and Follow-up:    DISPOSITION: *home   [place]    Lawerance Cruel, MD. Schedule an appointment as soon as possible for a visit in 1 week(s).   Specialty:  Family Medicine Why:  follow up on renal function, potassium Contact information: [place] [address] [phone]        Jettie Booze, MD Follow up on 07/01/2017.   Specialties:  Cardiology, Radiology, Interventional Cardiology Why:  at 9:30 AM with his PA [place]. Contact information: [address] [place] [address] [place] [address] [phone]            Time spent on Discharge 35 minutes  signed:   Estill Cotta M.D. [facility] 06/24/2017, 5:28 PM Pager: [phone]

## 2017-06-25 DIAGNOSIS — J9601 Acute respiratory failure with hypoxia: Secondary | ICD-10-CM | POA: Diagnosis not present

## 2017-06-25 DIAGNOSIS — R931 Abnormal findings on diagnostic imaging of heart and coronary circulation: Secondary | ICD-10-CM | POA: Diagnosis not present

## 2017-06-25 DIAGNOSIS — R0602 Shortness of breath: Secondary | ICD-10-CM | POA: Diagnosis not present

## 2017-06-25 DIAGNOSIS — E871 Hypo-osmolality and hyponatremia: Secondary | ICD-10-CM | POA: Diagnosis not present

## 2017-06-25 DIAGNOSIS — D649 Anemia, unspecified: Secondary | ICD-10-CM | POA: Diagnosis not present

## 2017-06-25 DIAGNOSIS — R1310 Dysphagia, unspecified: Secondary | ICD-10-CM | POA: Diagnosis not present

## 2017-06-25 DIAGNOSIS — C49A1 Gastrointestinal stromal tumor of esophagus: Secondary | ICD-10-CM | POA: Diagnosis not present

## 2017-06-25 DIAGNOSIS — I5033 Acute on chronic diastolic (congestive) heart failure: Secondary | ICD-10-CM | POA: Diagnosis not present

## 2017-06-25 DIAGNOSIS — I5089 Other heart failure: Secondary | ICD-10-CM | POA: Diagnosis not present

## 2017-06-25 DIAGNOSIS — E876 Hypokalemia: Secondary | ICD-10-CM | POA: Diagnosis not present

## 2017-06-26 ENCOUNTER — Telehealth: Payer: Self-pay | Admitting: Interventional Cardiology

## 2017-06-26 NOTE — Telephone Encounter (Signed)
Margarita Grizzle ( daughter) is calling because Mr.Harewood has lost 6lbs since yesterday and she is not sure what to do . Please call

## 2017-06-26 NOTE — Telephone Encounter (Signed)
Spoke with son-in-law as patient is unable to hear on the phone.  They are calling because wt went from 156.2 lbs yesterday AM to 149.6 lbs today.  They will continue to monitor wts and keep a dairy of them.  They are concerned he may become dehydrated.  He is not having any s/s of dehydration and has been eating and drinking well.  Advised to continue medications as listed for now.  They should call MD on call this weekend if he has any complaints of dizziness, lightheadedness or low BP.  Advised for pt to take care in changing positions.  Reviewed diet restrictions and how to count mgs of NA+ in foods.  Pt is concerned about being able to drive however instructed him to wait to discuss this at his post-hosp appt next week.  Son-in-law states understanding.  No further questions at this time.

## 2017-07-01 ENCOUNTER — Encounter: Payer: Self-pay | Admitting: Cardiology

## 2017-07-01 ENCOUNTER — Ambulatory Visit (INDEPENDENT_AMBULATORY_CARE_PROVIDER_SITE_OTHER): Payer: Medicare HMO | Admitting: Cardiology

## 2017-07-01 VITALS — BP 106/52 | HR 73 | Ht 68.0 in | Wt 171.0 lb

## 2017-07-01 DIAGNOSIS — I5032 Chronic diastolic (congestive) heart failure: Secondary | ICD-10-CM

## 2017-07-01 NOTE — Progress Notes (Signed)
07/01/2017 Kyle Armstrong   07/29/1930  [id number]  Primary Physician Lawerance Cruel, MD Primary Cardiologist: Dr. Irish Lack EP: Dr. Rayann Heman   Reason for Visit/CC: [facility] f/u for Diastolic CHF  HPI:  Kyle Armstrong is a 83 y.o. male history of SVT and AVNRT status post ablation by Dr. Rayann Heman in 2012. He has also been followed by Dr. Irish Lack. He also has a history of gastric cancer. He was recently admitted to [facility] for acute hypoxic respiratory failure in the setting of acute on chronic diastolic heart failure. BNP was 843. Echocardiogram revealed normal left ventricular EF of 55-60%. He was noted to have moderately to severely calcified aortic valve with moderate a AS and mild aortic regurgitation. He was treated with IV diuretics. He diuresed 8.1 L. His weight improved from 179 down to 170 pounds. He was transitioned back to PO Lasix. It was noted in his discharge summary that he is not considered a candidate for aortic valve replacement given his multiple other co morbidities.  He pesent to clinic today for post hospital follow-up. He reports that he has done well since discharge and he does not understand why he had to come to today's appointment. He denies any recurrent dyspnea. His lower extremity edema has resolved. No chest pain. His blood pressure is 106/52 and heart rate 73 bpm however he denies any dizziness, lightheadedness, syncope/near-syncope. No falls. His weight has remained stable since discharge. His office weight today, fully clothed is 171 pounds. Oxygen saturations are 97% on room air. He reports full medication compliance. He had follow-up laboratory work since discharge at his PCP office. These results were faxed to our office. His renal function is stable with serum creatinine of 1.04. Potassium is also stable at 4.4.  Current Meds  Medication Sig  . acetaminophen (TYLENOL) 500 MG tablet Take 1,000 mg by mouth every 6 (six) hours as needed for  mild pain or moderate pain.   Marland Kitchen aspirin EC 81 MG EC tablet Take 1 tablet (81 mg total) by mouth daily.  . celecoxib (CELEBREX) 200 MG capsule Take 200 mg by mouth 2 (two) times daily.  . Cyanocobalamin (VITAMIN B-12 IJ) Inject as directed every 30 (thirty) days.   . cyanocobalamin 1000 MCG tablet Take 1,000 mcg by mouth daily.  . ferrous sulfate 325 (65 FE) MG EC tablet Take 325 mg by mouth 3 (three) times daily with meals.  . furosemide (LASIX) 40 MG tablet Take 1 tablet (40 mg total) by mouth daily.  Marland Kitchen levothyroxine (SYNTHROID, LEVOTHROID) 50 MCG tablet Take 50 mcg by mouth every morning.  . metoprolol tartrate (LOPRESSOR) 25 MG tablet Take 0.5 tablets (12.5 mg total) by mouth 2 (two) times daily.  . pantoprazole (PROTONIX) 40 MG tablet Take 40 mg by mouth 2 (two) times daily as needed (for acid reflux).   . potassium chloride SA (K-DUR,KLOR-CON) 20 MEQ tablet Take 1 tablet (20 mEq total) by mouth daily.  . predniSONE (DELTASONE) 10 MG tablet Prednisone dosing: Take  Prednisone 30mg  (3 tabs) x 3 days, then taper to 20mg  (2 tabs) x 3days, then 10mg  (1 tab) x 3days, then OFF.  . ranitidine (ZANTAC) 150 MG capsule Take 150 mg by mouth as needed for heartburn.   . simvastatin (ZOCOR) 40 MG tablet Take 40 mg by mouth every evening.  . tamsulosin (FLOMAX) 0.4 MG CAPS capsule Take 0.4 mg by mouth daily.  . traMADol (ULTRAM) 50 MG tablet Take 1 tablet (50 mg total)  by mouth every 12 (twelve) hours as needed for severe pain.   Allergies  Allergen Reactions  . Lipitor [Atorvastatin] Other (See Comments)    Leg cramps  . Amoxicillin Rash    rash  . Augmentin [Amoxicillin-Pot Clavulanate] Rash   Past Medical History:  Diagnosis Date  . Allergy   . Arthritis   . Eczema   . Gastric cancer ([place]) 10/24/2013  . Hx of radiation therapy 11/10/13-12/21/13   gastric ca, total 50.4Gy  . Hyperlipidemia   . Hypothyroidism   . Lazy eye of right side   . Pernicious anemia   . SVT (supraventricular  tachycardia) (HCC)    AVNRT s/p ablation by Dr Rayann Heman   Family History  Problem Relation Age of Onset  . Heart attack Mother   . Heart attack Father   . Hypertension Unknown    Past Surgical History:  Procedure Laterality Date  . EP study and ablation  07/16/12   slow pathway ablation for AVNRT by DR Allred  . HERNIA REPAIR  1980's   inguinal done x 2  . LAPAROSCOPIC CHOLECYSTECTOMY W/ CHOLANGIOGRAPHY  2010/02/26  . LAPAROSCOPIC PARTIAL GASTRECTOMY N/A 02/15/2014   Procedure: LAPAROSCOPIC diagnositc, distal gastrostomy  feeding jejunostomy;  Surgeon: Stark Klein, MD;  Location: [facility] ORS;  Service: General;  Laterality: N/A;  . LAPAROSCOPIC [place] Bilateral 02-26-2006   bil recurrent inguinal hernias  . SUPRAVENTRICULAR TACHYCARDIA ABLATION Bilateral 07/16/2012   Procedure: SUPRAVENTRICULAR TACHYCARDIA ABLATION;  Surgeon: Thompson Grayer, MD;  Location: [facility] CATH LAB;  Service: Cardiovascular;  Laterality: Bilateral;   Social History   Social History  . Marital status: Widowed    Spouse name: N/A  . Number of children: 2  . Years of education: N/A   Occupational History  . Not on file.   Social History Main Topics  . Smoking status: Former Smoker    Packs/day: 1.00    Years: 30.00    Types: Cigarettes    Quit date: 11/24/1990  . Smokeless tobacco: Never Used  . Alcohol use No  . Drug use: No  . Sexual activity: No   Other Topics Concern  . Not on file   Social History Narrative   Widowed, wife died in 02/27/2011 (prior breast cancer patient)   Retired from [employer]   Currently works 3 days/week (4 hours) at [employer] alone, drives     Review of Systems: General: negative for chills, fever, night sweats or weight changes.  Cardiovascular: negative for chest pain, dyspnea on exertion, edema, orthopnea, palpitations, paroxysmal nocturnal dyspnea or shortness of breath Dermatological: negative for rash Respiratory: negative for cough or  wheezing Urologic: negative for hematuria Abdominal: negative for nausea, vomiting, diarrhea, bright red blood per rectum, melena, or hematemesis Neurologic: negative for visual changes, syncope, or dizziness All other systems reviewed and are otherwise negative except as noted above.   Physical Exam:  Height 5\' 8"  (1.727 m).  General appearance: alert, cooperative and no distress Neck: no carotid bruit and no JVD Lungs: clear to auscultation bilaterally Heart: regular rate and rhythm, S1, S2 normal, no murmur, click, rub or gallop Extremities: extremities normal, atraumatic, no cyanosis or edema Pulses: 2+ and symmetric Skin: Skin color, texture, turgor normal. No rashes or lesions Neurologic: Grossly normal  EKG not performed -- personally reviewed   ASSESSMENT AND PLAN:   1. Chronic Diastolic HF: Acute exacerbated treated with IV diuretics. He is euvolemic on physical exam and his weight is stable since  discharge at 171 pounds. He denies any dyspnea. No lower extremity  edema on exam. Follow-up BMP post hospitalization showed normal renal function at 1.04 and normal potassium at 4.4. We'll continue current regimen of Lasix, 40 mg daily, along with supplemental potassium. We discussed the importance of low sodium diet. He was given instructions on low sodium diet and daily weights and instructed to call our office if he gains more than 3 pounds in 24 hours or 5 pounds in one week.  2. Aortic valve disease: Recent echocardiogram during his hospitalization revealed moderate AS and mild AI. It was outlined in hospital records that given his multiple comorbidities he is not a candidate for surgical replacement. He is asymptomatic without chest pain, dyspnea or syncope. We will have patient follow-up with Dr. Irish Lack in several months to reassess condition. Question if we can later consider possible TAVR if he has progression to severe range.  Follow-Up w/ Dr. Irish Lack in 4-6  months  [place] PA-C, [facility] 07/01/2017 10:01 AM

## 2017-07-01 NOTE — Patient Instructions (Addendum)
Medication Instructions:  Your physician recommends that you continue on your current medications as directed. Please refer to the Current Medication list given to you today.   Labwork: None ordered  Testing/Procedures: None ordered  Follow-Up:  Your physician recommends that you schedule a follow-up appointment in: January 2019 WITH DR. VARANASI (CALL us IF YOU START HAVING TROUBLE AND WE WILL GET YOU IN SOONER)   Any Other Special Instructions Will Be Listed Below (If Applicable).   Heart Failure Heart failure means your heart has trouble pumping blood. This makes it hard for your body to work well. Heart failure is usually a long-term (chronic) condition. You must take good care of yourself and follow your doctor's treatment plan. Follow these instructions at home:  Take your heart medicine as told by your doctor. ? Do not stop taking medicine unless your doctor tells you to. ? Do not skip any dose of medicine. ? Refill your medicines before they run out. ? Take other medicines only as told by your doctor or pharmacist.  Stay active if told by your doctor. The elderly and people with severe heart failure should talk with a doctor about physical activity.  Eat heart-healthy foods. Choose foods that are without trans fat and are low in saturated fat, cholesterol, and salt (sodium). This includes fresh or frozen fruits and vegetables, fish, lean meats, fat-free or low-fat dairy foods, whole grains, and high-fiber foods. Lentils and dried peas and beans (legumes) are also good choices.  Limit salt if told by your doctor.  Cook in a healthy way. Roast, grill, broil, bake, poach, steam, or stir-fry foods.  Limit fluids as told by your doctor.  Weigh yourself every morning. Do this after you pee (urinate) and before you eat breakfast. Write down your weight to give to your doctor.  CALL us IF YOU GAIN 3 LBS OR MORE IN 24 HOURS OR 5 LBS IN A WEEK  Take your blood pressure and write  it down if your doctor tells you to.  Ask your doctor how to check your pulse. Check your pulse as told.  Lose weight if told by your doctor.  Stop smoking or chewing tobacco. Do not use gum or patches that help you quit without your doctor's approval.  Schedule and go to doctor visits as told.  Nonpregnant women should have no more than 1 drink a day. Men should have no more than 2 drinks a day. Talk to your doctor about drinking alcohol.  Stop illegal drug use.  Stay current with shots (immunizations).  Manage your health conditions as told by your doctor.  Learn to manage your stress.  Rest when you are tired.  If it is really hot outside: ? Avoid intense activities. ? Use air conditioning or fans, or get in a cooler place. ? Avoid caffeine and alcohol. ? Wear loose-fitting, lightweight, and light-colored clothing.  If it is really cold outside: ? Avoid intense activities. ? Layer your clothing. ? Wear mittens or gloves, a hat, and a scarf when going outside. ? Avoid alcohol.  Learn about heart failure and get support as needed.  Get help to maintain or improve your quality of life and your ability to care for yourself as needed. Contact a doctor if:  You gain weight quickly.  You are more short of breath than usual.  You cannot do your normal activities.  You tire easily.  You cough more than normal, especially with activity.  You have any or more puffiness (  swelling) in areas such as your hands, feet, ankles, or belly (abdomen).  You cannot sleep because it is hard to breathe.  You feel like your heart is beating fast (palpitations).  You get dizzy or light-headed when you stand up. Get help right away if:  You have trouble breathing.  There is a change in mental status, such as becoming less alert or not being able to focus.  You have chest pain or discomfort.  You faint. This information is not intended to replace advice given to you by your  health care provider. Make sure you discuss any questions you have with your health care provider. Document Released: 08/19/2008 Document Revised: 04/17/2016 Document Reviewed: 12/27/2012 Elsevier Interactive Patient Education  2017 [place] DASH stands for "Dietary Approaches to Stop Hypertension." The DASH eating plan is a healthy eating plan that has been shown to reduce high blood pressure (hypertension). It may also reduce your risk for type 2 diabetes, heart disease, and stroke. The DASH eating plan may also help with weight loss. What are tips for following this plan? General guidelines  Avoid eating more than 2,000 mg (milligrams) of salt (sodium) a day. If you have hypertension, you may need to reduce your sodium intake to 1,500 mg a day.  Limit alcohol intake to no more than 1 drink a day for nonpregnant women and 2 drinks a day for men. One drink equals 12 oz of beer, 5 oz of wine, or 1 oz of hard liquor.  Work with your health care provider to maintain a healthy body weight or to lose weight. Ask what an ideal weight is for you.  Get at least 30 minutes of exercise that causes your heart to beat faster (aerobic exercise) most days of the week. Activities may include walking, swimming, or biking.  Work with your health care provider or diet and nutrition specialist (dietitian) to adjust your eating plan to your individual calorie needs. Reading food labels  Check food labels for the amount of sodium per serving. Choose foods with less than 5 percent of the Daily Value of sodium. Generally, foods with less than 300 mg of sodium per serving fit into this eating plan.  To find whole grains, look for the word "whole" as the first word in the ingredient list. Shopping  Buy products labeled as "low-sodium" or "no salt added."  Buy fresh foods. Avoid canned foods and premade or frozen meals. Cooking  Avoid adding salt when cooking. Use salt-free  seasonings or herbs instead of table salt or sea salt. Check with your health care provider or pharmacist before using salt substitutes.  Do not fry foods. Cook foods using healthy methods such as baking, boiling, grilling, and broiling instead.  Cook with heart-healthy oils, such as olive, canola, soybean, or sunflower oil. Meal planning   Eat a balanced diet that includes: ? 5 or more servings of fruits and vegetables each day. At each meal, try to fill half of your plate with fruits and vegetables. ? Up to 6-8 servings of whole grains each day. ? Less than 6 oz of lean meat, poultry, or fish each day. A 3-oz serving of meat is about the same size as a deck of cards. One egg equals 1 oz. ? 2 servings of low-fat dairy each day. ? A serving of nuts, seeds, or beans 5 times each week. ? Heart-healthy fats. Healthy fats called Omega-3 fatty acids are found in  foods such as flaxseeds and coldwater fish, like sardines, salmon, and mackerel.  Limit how much you eat of the following: ? Canned or prepackaged foods. ? Food that is high in trans fat, such as fried foods. ? Food that is high in saturated fat, such as fatty meat. ? Sweets, desserts, sugary drinks, and other foods with added sugar. ? Full-fat dairy products.  Do not salt foods before eating.  Try to eat at least 2 vegetarian meals each week.  Eat more home-cooked food and less restaurant, buffet, and fast food.  When eating at a restaurant, ask that your food be prepared with less salt or no salt, if possible. What foods are recommended? The items listed may not be a complete list. Talk with your dietitian about what dietary choices are best for you. Grains Whole-grain or whole-wheat bread. Whole-grain or whole-wheat pasta. Brown rice. [place]. Bulgur. Whole-grain and low-sodium cereals. Pita bread. Low-fat, low-sodium crackers. Whole-wheat flour tortillas. Vegetables Fresh or frozen vegetables (raw, steamed, roasted,  or grilled). Low-sodium or reduced-sodium tomato and vegetable juice. Low-sodium or reduced-sodium tomato sauce and tomato paste. Low-sodium or reduced-sodium canned vegetables. Fruits All fresh, dried, or frozen fruit. Canned fruit in natural juice (without added sugar). Meat and other protein foods Skinless chicken or [place]. Ground chicken or [place]. Pork with fat trimmed off. Fish and seafood. Egg whites. Dried beans, peas, or lentils. Unsalted nuts, nut butters, and seeds. Unsalted canned beans. Lean cuts of beef with fat trimmed off. Low-sodium, lean deli meat. Dairy Low-fat (1%) or fat-free (skim) milk. Fat-free, low-fat, or reduced-fat cheeses. Nonfat, low-sodium ricotta or cottage cheese. Low-fat or nonfat yogurt. Low-fat, low-sodium cheese. Fats and oils Soft margarine without trans fats. Vegetable oil. Low-fat, reduced-fat, or light mayonnaise and salad dressings (reduced-sodium). Canola, safflower, olive, soybean, and sunflower oils. Avocado. Seasoning and other foods Herbs. Spices. Seasoning mixes without salt. Unsalted popcorn and pretzels. Fat-free sweets. What foods are not recommended? The items listed may not be a complete list. Talk with your dietitian about what dietary choices are best for you. Grains Baked goods made with fat, such as croissants, muffins, or some breads. Dry pasta or rice meal packs. Vegetables Creamed or fried vegetables. Vegetables in a cheese sauce. Regular canned vegetables (not low-sodium or reduced-sodium). Regular canned tomato sauce and paste (not low-sodium or reduced-sodium). Regular tomato and vegetable juice (not low-sodium or reduced-sodium). Angie Fava. Olives. Fruits Canned fruit in a light or heavy syrup. Fried fruit. Fruit in cream or butter sauce. Meat and other protein foods Fatty cuts of meat. Ribs. Fried meat. Berniece Salines. Sausage. Bologna and other processed lunch meats. Salami. Fatback. Hotdogs. Bratwurst. Salted nuts and seeds. Canned beans  with added salt. Canned or smoked fish. Whole eggs or egg yolks. Chicken or [place] with skin. Dairy Whole or 2% milk, cream, and half-and-half. Whole or full-fat cream cheese. Whole-fat or sweetened yogurt. Full-fat cheese. Nondairy creamers. Whipped toppings. Processed cheese and cheese spreads. Fats and oils Butter. Stick margarine. Lard. Shortening. Ghee. Bacon fat. Tropical oils, such as coconut, palm kernel, or palm oil. Seasoning and other foods Salted popcorn and pretzels. Onion salt, garlic salt, seasoned salt, table salt, and sea salt. Worcestershire sauce. Tartar sauce. Barbecue sauce. Teriyaki sauce. Soy sauce, including reduced-sodium. Steak sauce. Canned and packaged gravies. Fish sauce. Oyster sauce. Cocktail sauce. Horseradish that you find on the shelf. Ketchup. Mustard. Meat flavorings and tenderizers. Bouillon cubes. Hot sauce and Tabasco sauce. Premade or packaged marinades. Premade or packaged taco seasonings. Relishes. Regular  salad dressings. Where to find more information:  National Heart, Lung, and [place]: https://wilson-eaton.com/  American Heart Association: www.heart.org Summary  The DASH eating plan is a healthy eating plan that has been shown to reduce high blood pressure (hypertension). It may also reduce your risk for type 2 diabetes, heart disease, and stroke.  With the DASH eating plan, you should limit salt (sodium) intake to 2,300 mg a day. If you have hypertension, you may need to reduce your sodium intake to 1,500 mg a day.  When on the DASH eating plan, aim to eat more fresh fruits and vegetables, whole grains, lean proteins, low-fat dairy, and heart-healthy fats.  Work with your health care provider or diet and nutrition specialist (dietitian) to adjust your eating plan to your individual calorie needs. This information is not intended to replace advice given to you by your health care provider. Make sure you discuss any questions you have with your health  care provider. Document Released: 10/30/2011 Document Revised: 11/03/2016 Document Reviewed: 11/03/2016 Elsevier Interactive Patient Education  2017 [employer].    If you need a refill on your cardiac medications before your next appointment, please call your pharmacy.

## 2017-07-20 DIAGNOSIS — H35381 Toxic maculopathy, right eye: Secondary | ICD-10-CM | POA: Diagnosis not present

## 2017-07-20 DIAGNOSIS — H2511 Age-related nuclear cataract, right eye: Secondary | ICD-10-CM | POA: Diagnosis not present

## 2017-07-20 DIAGNOSIS — H52202 Unspecified astigmatism, left eye: Secondary | ICD-10-CM | POA: Diagnosis not present

## 2017-07-20 DIAGNOSIS — H353131 Nonexudative age-related macular degeneration, bilateral, early dry stage: Secondary | ICD-10-CM | POA: Diagnosis not present

## 2017-08-03 DIAGNOSIS — E538 Deficiency of other specified B group vitamins: Secondary | ICD-10-CM | POA: Diagnosis not present

## 2017-08-04 DIAGNOSIS — M47816 Spondylosis without myelopathy or radiculopathy, lumbar region: Secondary | ICD-10-CM | POA: Diagnosis not present

## 2017-09-01 DIAGNOSIS — E538 Deficiency of other specified B group vitamins: Secondary | ICD-10-CM | POA: Diagnosis not present

## 2017-09-01 DIAGNOSIS — I959 Hypotension, unspecified: Secondary | ICD-10-CM | POA: Diagnosis not present

## 2017-09-01 DIAGNOSIS — Z23 Encounter for immunization: Secondary | ICD-10-CM | POA: Diagnosis not present

## 2017-09-15 DIAGNOSIS — E538 Deficiency of other specified B group vitamins: Secondary | ICD-10-CM | POA: Diagnosis not present

## 2017-09-15 DIAGNOSIS — Z0000 Encounter for general adult medical examination without abnormal findings: Secondary | ICD-10-CM | POA: Diagnosis not present

## 2017-09-15 DIAGNOSIS — I2510 Atherosclerotic heart disease of native coronary artery without angina pectoris: Secondary | ICD-10-CM | POA: Diagnosis not present

## 2017-09-15 DIAGNOSIS — Z1389 Encounter for screening for other disorder: Secondary | ICD-10-CM | POA: Diagnosis not present

## 2017-09-15 DIAGNOSIS — I5033 Acute on chronic diastolic (congestive) heart failure: Secondary | ICD-10-CM | POA: Diagnosis not present

## 2017-09-15 DIAGNOSIS — E782 Mixed hyperlipidemia: Secondary | ICD-10-CM | POA: Diagnosis not present

## 2017-09-15 DIAGNOSIS — B0229 Other postherpetic nervous system involvement: Secondary | ICD-10-CM | POA: Diagnosis not present

## 2017-09-15 DIAGNOSIS — E039 Hypothyroidism, unspecified: Secondary | ICD-10-CM | POA: Diagnosis not present

## 2017-09-15 DIAGNOSIS — M419 Scoliosis, unspecified: Secondary | ICD-10-CM | POA: Diagnosis not present

## 2017-09-15 DIAGNOSIS — R350 Frequency of micturition: Secondary | ICD-10-CM | POA: Diagnosis not present

## 2017-11-10 DIAGNOSIS — D510 Vitamin B12 deficiency anemia due to intrinsic factor deficiency: Secondary | ICD-10-CM | POA: Diagnosis not present

## 2017-12-28 DIAGNOSIS — M47816 Spondylosis without myelopathy or radiculopathy, lumbar region: Secondary | ICD-10-CM | POA: Diagnosis not present

## 2018-01-05 ENCOUNTER — Telehealth: Payer: Self-pay | Admitting: Oncology

## 2018-01-05 ENCOUNTER — Inpatient Hospital Stay: Payer: Medicare HMO | Attending: Oncology | Admitting: Oncology

## 2018-01-05 ENCOUNTER — Inpatient Hospital Stay: Payer: Medicare HMO

## 2018-01-05 VITALS — BP 118/75 | HR 68 | Temp 97.9°F | Resp 18 | Ht 68.0 in | Wt 163.6 lb

## 2018-01-05 DIAGNOSIS — Z923 Personal history of irradiation: Secondary | ICD-10-CM

## 2018-01-05 DIAGNOSIS — D510 Vitamin B12 deficiency anemia due to intrinsic factor deficiency: Secondary | ICD-10-CM

## 2018-01-05 DIAGNOSIS — C163 Malignant neoplasm of pyloric antrum: Secondary | ICD-10-CM | POA: Diagnosis not present

## 2018-01-05 DIAGNOSIS — Z79899 Other long term (current) drug therapy: Secondary | ICD-10-CM

## 2018-01-05 DIAGNOSIS — D649 Anemia, unspecified: Secondary | ICD-10-CM | POA: Diagnosis not present

## 2018-01-05 DIAGNOSIS — R600 Localized edema: Secondary | ICD-10-CM | POA: Diagnosis not present

## 2018-01-05 DIAGNOSIS — G8929 Other chronic pain: Secondary | ICD-10-CM | POA: Diagnosis not present

## 2018-01-05 DIAGNOSIS — Z9221 Personal history of antineoplastic chemotherapy: Secondary | ICD-10-CM | POA: Insufficient documentation

## 2018-01-05 DIAGNOSIS — M549 Dorsalgia, unspecified: Secondary | ICD-10-CM | POA: Insufficient documentation

## 2018-01-05 DIAGNOSIS — M1990 Unspecified osteoarthritis, unspecified site: Secondary | ICD-10-CM

## 2018-01-05 DIAGNOSIS — Z87891 Personal history of nicotine dependence: Secondary | ICD-10-CM | POA: Diagnosis not present

## 2018-01-05 LAB — CBC WITH DIFFERENTIAL/PLATELET
BASOS PCT: 2 %
Basophils Absolute: 0.1 10*3/uL (ref 0.0–0.1)
EOS ABS: 0.8 10*3/uL — AB (ref 0.0–0.5)
Eosinophils Relative: 12 %
HEMATOCRIT: 30.2 % — AB (ref 38.4–49.9)
Hemoglobin: 9.9 g/dL — ABNORMAL LOW (ref 13.0–17.1)
LYMPHS ABS: 0.8 10*3/uL — AB (ref 0.9–3.3)
Lymphocytes Relative: 12 %
MCH: 32 pg (ref 27.2–33.4)
MCHC: 32.9 g/dL (ref 32.0–36.0)
MCV: 97.3 fL (ref 79.3–98.0)
Monocytes Absolute: 0.7 10*3/uL (ref 0.1–0.9)
Monocytes Relative: 11 %
NEUTROS ABS: 4.4 10*3/uL (ref 1.5–6.5)
NEUTROS PCT: 63 %
Platelets: 212 10*3/uL (ref 140–400)
RBC: 3.1 MIL/uL — AB (ref 4.20–5.82)
RDW: 14.3 % (ref 11.0–14.6)
WBC: 6.9 10*3/uL (ref 4.0–10.3)

## 2018-01-05 NOTE — Telephone Encounter (Signed)
Scheduled appt per 2/12 los - Gave patient AVS and calender per los.

## 2018-01-05 NOTE — Progress Notes (Signed)
[place] OFFICE PROGRESS NOTE   Diagnosis: Gastric cancer  INTERVAL HISTORY:   Kyle Armstrong returns as scheduled.  Good appetite.  He reports chronic back and arthritis pain.  He receives a steroid injection to the back approximately every 3 months.  He is no longer working.  He is taking vitamin B12.  Objective:  Vital signs in last 24 hours:  Blood pressure 118/75, pulse 68, temperature 97.9 F (36.6 C), temperature source Oral, resp. rate 18, height '5\' 8"'$  (1.727 m), weight 163 lb 9.6 oz (74.2 kg), SpO2 100 %.    HEENT: Neck without mass Lymphatics: No cervical, supraclavicular, axillary, or inguinal nodes Resp: Diffuse inspiratory rales, no respiratory distress Cardio: Regular rate and rhythm, 2/6 systolic murmur GI: No hepatospleno megaly, nontender, no mass Vascular: Trace edema at the left greater than right lower leg  Lab Results:  Lab Results  Component Value Date   WBC 6.9 01/05/2018   HGB 9.9 (L) 01/05/2018   HCT 30.2 (L) 01/05/2018   MCV 97.3 01/05/2018   PLT 212 01/05/2018   NEUTROABS 4.4 01/05/2018    CMP     Component Value Date/Time   NA 128 (L) 06/24/2017 0423   NA 136 12/26/2013 1125   K 4.5 06/24/2017 1614   K 3.3 (L) 12/26/2013 1125   CL 84 (L) 06/24/2017 0423   CO2 35 (H) 06/24/2017 0423   CO2 25 12/26/2013 1125   GLUCOSE 95 06/24/2017 0423   GLUCOSE 130 12/26/2013 1125   BUN 30 (H) 06/24/2017 0423   BUN 13.0 12/26/2013 1125   CREATININE 1.14 06/24/2017 0423   CREATININE 0.80 03/09/2014 0802   CREATININE 0.9 12/26/2013 1125   CALCIUM 8.6 (L) 06/24/2017 0423   CALCIUM 8.6 12/26/2013 1125   PROT 5.3 (L) 06/21/2017 0121   PROT 5.6 (L) 12/26/2013 1125   ALBUMIN 3.3 (L) 06/21/2017 0121   ALBUMIN 2.9 (L) 12/26/2013 1125   AST 18 06/21/2017 0121   AST 25 12/26/2013 1125   ALT 28 06/21/2017 0121   ALT 12 12/26/2013 1125   ALKPHOS 82 06/21/2017 0121   ALKPHOS 68 12/26/2013 1125   BILITOT 0.7 06/21/2017 0121   BILITOT 0.53  12/26/2013 1125   GFRNONAA 57 (L) 06/24/2017 0423   GFRAA >60 06/24/2017 0423    No results found for: CEA1  Lab Results  Component Value Date   INR 1.06 02/09/2014    Imaging:  No results found.  Medications: I have reviewed the patient's current medications.   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 HER-2/neu amplified   Staging chest CT negative on 10/27/2013.   Initiation radiation and concurrent Xeloda 11/10/2013, completed 12/21/2013   Restaging CTs of the chest, abdomen, and pelvis on 01/25/2014 with persistent antral thickening, no evidence of metastatic disease.   Distal gastrectomy with Billroth II anastomosis 02/15/2014 (y pT1b, y pN0).  CT scans 03/14/2016 for evaluation of mid abdominal and back pain-no acute findings within the abdomen or pelvis. Postoperative changes identified compatible distal gastrectomy with gastrojejunostomy.  2. Diffuse "gastritis "change noted on endoscopies 08/19/2013 and 10/06/2013 with biopsies confirming atrophic gastritis. Low and high-grade dysplasia were noted on the biopsy 08/19/2013  3. History of Anemia-likely secondary to GI blood loss, surgery, chemotherapy/radiation, and malnutrition 4. History of anorexia/weight loss.  5. History of colon polyp.  6. History of "sarcoidosis", pulmonary fibrosis noted on the chest CT 10/27/2013.  7. History  of tobacco use.  8. History of arrhythmia, status post an ablation August 2013.  9. Chronic Back pain-most likely secondary to a benign musculoskeletal condition   Disposition: Kyle Armstrong remains in clinical remission from gastric cancer.  He has persistent normocytic anemia.  The anemia is likely related to chronic disease, and possibly a component of malnutrition.  He appears asymptomatic from anemia.  He will return for an office visit and CBC in 8  months.  15 minutes were spent with the patient today.  The majority of the time was used for counseling and coordination of care.  Betsy Coder, MD  01/05/2018  12:21 PM

## 2018-01-14 DIAGNOSIS — M47816 Spondylosis without myelopathy or radiculopathy, lumbar region: Secondary | ICD-10-CM | POA: Diagnosis not present

## 2018-01-19 DIAGNOSIS — E538 Deficiency of other specified B group vitamins: Secondary | ICD-10-CM | POA: Diagnosis not present

## 2018-03-26 DIAGNOSIS — R531 Weakness: Secondary | ICD-10-CM | POA: Diagnosis not present

## 2018-03-26 DIAGNOSIS — I959 Hypotension, unspecified: Secondary | ICD-10-CM | POA: Diagnosis not present

## 2018-03-26 DIAGNOSIS — Z79899 Other long term (current) drug therapy: Secondary | ICD-10-CM | POA: Diagnosis not present

## 2018-03-26 DIAGNOSIS — E538 Deficiency of other specified B group vitamins: Secondary | ICD-10-CM | POA: Diagnosis not present

## 2018-04-10 ENCOUNTER — Inpatient Hospital Stay (HOSPITAL_COMMUNITY)
Admission: EM | Admit: 2018-04-10 | Discharge: 2018-04-16 | DRG: 292 | Disposition: A | Discharge status: Home Health | Payer: Medicare HMO | Attending: Family Medicine | Admitting: Family Medicine

## 2018-04-10 ENCOUNTER — Emergency Department (HOSPITAL_COMMUNITY): Payer: Medicare HMO

## 2018-04-10 ENCOUNTER — Other Ambulatory Visit: Payer: Self-pay

## 2018-04-10 DIAGNOSIS — I350 Nonrheumatic aortic (valve) stenosis: Secondary | ICD-10-CM | POA: Diagnosis not present

## 2018-04-10 DIAGNOSIS — H9190 Unspecified hearing loss, unspecified ear: Secondary | ICD-10-CM | POA: Diagnosis present

## 2018-04-10 DIAGNOSIS — I959 Hypotension, unspecified: Secondary | ICD-10-CM | POA: Diagnosis not present

## 2018-04-10 DIAGNOSIS — Z79899 Other long term (current) drug therapy: Secondary | ICD-10-CM

## 2018-04-10 DIAGNOSIS — Z9889 Other specified postprocedural states: Secondary | ICD-10-CM

## 2018-04-10 DIAGNOSIS — D860 Sarcoidosis of lung: Secondary | ICD-10-CM | POA: Diagnosis present

## 2018-04-10 DIAGNOSIS — I110 Hypertensive heart disease with heart failure: Secondary | ICD-10-CM | POA: Diagnosis present

## 2018-04-10 DIAGNOSIS — I5021 Acute systolic (congestive) heart failure: Secondary | ICD-10-CM

## 2018-04-10 DIAGNOSIS — Z931 Gastrostomy status: Secondary | ICD-10-CM

## 2018-04-10 DIAGNOSIS — Z7982 Long term (current) use of aspirin: Secondary | ICD-10-CM | POA: Diagnosis not present

## 2018-04-10 DIAGNOSIS — Z79890 Hormone replacement therapy (postmenopausal): Secondary | ICD-10-CM

## 2018-04-10 DIAGNOSIS — Z881 Allergy status to other antibiotic agents status: Secondary | ICD-10-CM

## 2018-04-10 DIAGNOSIS — Z87891 Personal history of nicotine dependence: Secondary | ICD-10-CM | POA: Diagnosis not present

## 2018-04-10 DIAGNOSIS — Z9049 Acquired absence of other specified parts of digestive tract: Secondary | ICD-10-CM

## 2018-04-10 DIAGNOSIS — Z9221 Personal history of antineoplastic chemotherapy: Secondary | ICD-10-CM | POA: Diagnosis not present

## 2018-04-10 DIAGNOSIS — I509 Heart failure, unspecified: Secondary | ICD-10-CM

## 2018-04-10 DIAGNOSIS — J90 Pleural effusion, not elsewhere classified: Secondary | ICD-10-CM

## 2018-04-10 DIAGNOSIS — I5031 Acute diastolic (congestive) heart failure: Secondary | ICD-10-CM | POA: Diagnosis not present

## 2018-04-10 DIAGNOSIS — J948 Other specified pleural conditions: Secondary | ICD-10-CM | POA: Diagnosis not present

## 2018-04-10 DIAGNOSIS — D510 Vitamin B12 deficiency anemia due to intrinsic factor deficiency: Secondary | ICD-10-CM

## 2018-04-10 DIAGNOSIS — E039 Hypothyroidism, unspecified: Secondary | ICD-10-CM | POA: Diagnosis present

## 2018-04-10 DIAGNOSIS — Z6821 Body mass index (BMI) 21.0-21.9, adult: Secondary | ICD-10-CM

## 2018-04-10 DIAGNOSIS — Z923 Personal history of irradiation: Secondary | ICD-10-CM | POA: Diagnosis not present

## 2018-04-10 DIAGNOSIS — I5033 Acute on chronic diastolic (congestive) heart failure: Secondary | ICD-10-CM | POA: Diagnosis present

## 2018-04-10 DIAGNOSIS — Z888 Allergy status to other drugs, medicaments and biological substances status: Secondary | ICD-10-CM

## 2018-04-10 DIAGNOSIS — Z8249 Family history of ischemic heart disease and other diseases of the circulatory system: Secondary | ICD-10-CM | POA: Diagnosis not present

## 2018-04-10 DIAGNOSIS — Z79891 Long term (current) use of opiate analgesic: Secondary | ICD-10-CM | POA: Diagnosis not present

## 2018-04-10 DIAGNOSIS — Z85028 Personal history of other malignant neoplasm of stomach: Secondary | ICD-10-CM | POA: Diagnosis not present

## 2018-04-10 DIAGNOSIS — I080 Rheumatic disorders of both mitral and aortic valves: Secondary | ICD-10-CM | POA: Diagnosis present

## 2018-04-10 DIAGNOSIS — R918 Other nonspecific abnormal finding of lung field: Secondary | ICD-10-CM | POA: Diagnosis not present

## 2018-04-10 DIAGNOSIS — Z903 Acquired absence of stomach [part of]: Secondary | ICD-10-CM

## 2018-04-10 DIAGNOSIS — D649 Anemia, unspecified: Secondary | ICD-10-CM | POA: Diagnosis present

## 2018-04-10 DIAGNOSIS — E871 Hypo-osmolality and hyponatremia: Secondary | ICD-10-CM | POA: Diagnosis present

## 2018-04-10 DIAGNOSIS — E785 Hyperlipidemia, unspecified: Secondary | ICD-10-CM | POA: Diagnosis present

## 2018-04-10 DIAGNOSIS — R64 Cachexia: Secondary | ICD-10-CM | POA: Diagnosis present

## 2018-04-10 DIAGNOSIS — N400 Enlarged prostate without lower urinary tract symptoms: Secondary | ICD-10-CM | POA: Diagnosis present

## 2018-04-10 DIAGNOSIS — R079 Chest pain, unspecified: Secondary | ICD-10-CM | POA: Diagnosis not present

## 2018-04-10 DIAGNOSIS — R0602 Shortness of breath: Secondary | ICD-10-CM | POA: Diagnosis not present

## 2018-04-10 LAB — I-STAT TROPONIN, ED: TROPONIN I, POC: 0 ng/mL (ref 0.00–0.08)

## 2018-04-10 LAB — CBC
HEMATOCRIT: 25.6 % — AB (ref 39.0–52.0)
HEMOGLOBIN: 8.6 g/dL — AB (ref 13.0–17.0)
MCH: 31.7 pg (ref 26.0–34.0)
MCHC: 33.6 g/dL (ref 30.0–36.0)
MCV: 94.5 fL (ref 78.0–100.0)
Platelets: 243 10*3/uL (ref 150–400)
RBC: 2.71 MIL/uL — ABNORMAL LOW (ref 4.22–5.81)
RDW: 12.6 % (ref 11.5–15.5)
WBC: 7.8 10*3/uL (ref 4.0–10.5)

## 2018-04-10 LAB — BASIC METABOLIC PANEL
Anion gap: 9 (ref 5–15)
BUN: 18 mg/dL (ref 6–20)
CALCIUM: 8.7 mg/dL — AB (ref 8.9–10.3)
CO2: 27 mmol/L (ref 22–32)
Chloride: 84 mmol/L — ABNORMAL LOW (ref 101–111)
Creatinine, Ser: 0.97 mg/dL (ref 0.61–1.24)
GFR calc Af Amer: >60 mL/min (ref >60)
Glucose, Bld: 127 mg/dL — ABNORMAL HIGH (ref 65–99)
POTASSIUM: 4.8 mmol/L (ref 3.5–5.1)
SODIUM: 120 mmol/L — AB (ref 135–145)

## 2018-04-10 NOTE — ED Triage Notes (Signed)
Pt from home. Hx of CHF.  Pt wears O2 at home.  Increased weakness and SHOB. Breath sounds diminished.  No wheezing.  Pt not on O2 on arrival. Placed on 2L  for comfort.  No pain. No diaphoresis.

## 2018-04-11 ENCOUNTER — Encounter (HOSPITAL_COMMUNITY): Payer: Self-pay

## 2018-04-11 ENCOUNTER — Other Ambulatory Visit: Payer: Self-pay

## 2018-04-11 DIAGNOSIS — Z79899 Other long term (current) drug therapy: Secondary | ICD-10-CM | POA: Diagnosis not present

## 2018-04-11 DIAGNOSIS — D860 Sarcoidosis of lung: Secondary | ICD-10-CM | POA: Diagnosis present

## 2018-04-11 DIAGNOSIS — H9190 Unspecified hearing loss, unspecified ear: Secondary | ICD-10-CM | POA: Diagnosis present

## 2018-04-11 DIAGNOSIS — R0602 Shortness of breath: Secondary | ICD-10-CM | POA: Diagnosis not present

## 2018-04-11 DIAGNOSIS — Z903 Acquired absence of stomach [part of]: Secondary | ICD-10-CM | POA: Diagnosis not present

## 2018-04-11 DIAGNOSIS — Z79890 Hormone replacement therapy (postmenopausal): Secondary | ICD-10-CM | POA: Diagnosis not present

## 2018-04-11 DIAGNOSIS — Z923 Personal history of irradiation: Secondary | ICD-10-CM | POA: Diagnosis not present

## 2018-04-11 DIAGNOSIS — Z87891 Personal history of nicotine dependence: Secondary | ICD-10-CM | POA: Diagnosis not present

## 2018-04-11 DIAGNOSIS — D649 Anemia, unspecified: Secondary | ICD-10-CM | POA: Diagnosis present

## 2018-04-11 DIAGNOSIS — Z8249 Family history of ischemic heart disease and other diseases of the circulatory system: Secondary | ICD-10-CM | POA: Diagnosis not present

## 2018-04-11 DIAGNOSIS — I509 Heart failure, unspecified: Secondary | ICD-10-CM | POA: Diagnosis not present

## 2018-04-11 DIAGNOSIS — J90 Pleural effusion, not elsewhere classified: Secondary | ICD-10-CM

## 2018-04-11 DIAGNOSIS — I350 Nonrheumatic aortic (valve) stenosis: Secondary | ICD-10-CM | POA: Diagnosis not present

## 2018-04-11 DIAGNOSIS — I110 Hypertensive heart disease with heart failure: Secondary | ICD-10-CM | POA: Diagnosis present

## 2018-04-11 DIAGNOSIS — Z85028 Personal history of other malignant neoplasm of stomach: Secondary | ICD-10-CM | POA: Diagnosis not present

## 2018-04-11 DIAGNOSIS — E785 Hyperlipidemia, unspecified: Secondary | ICD-10-CM | POA: Diagnosis present

## 2018-04-11 DIAGNOSIS — I5031 Acute diastolic (congestive) heart failure: Secondary | ICD-10-CM | POA: Diagnosis not present

## 2018-04-11 DIAGNOSIS — I5021 Acute systolic (congestive) heart failure: Secondary | ICD-10-CM | POA: Diagnosis not present

## 2018-04-11 DIAGNOSIS — R64 Cachexia: Secondary | ICD-10-CM | POA: Diagnosis present

## 2018-04-11 DIAGNOSIS — E039 Hypothyroidism, unspecified: Secondary | ICD-10-CM | POA: Diagnosis present

## 2018-04-11 DIAGNOSIS — Z9049 Acquired absence of other specified parts of digestive tract: Secondary | ICD-10-CM | POA: Diagnosis not present

## 2018-04-11 DIAGNOSIS — I080 Rheumatic disorders of both mitral and aortic valves: Secondary | ICD-10-CM | POA: Diagnosis present

## 2018-04-11 DIAGNOSIS — Z7982 Long term (current) use of aspirin: Secondary | ICD-10-CM | POA: Diagnosis not present

## 2018-04-11 DIAGNOSIS — Z931 Gastrostomy status: Secondary | ICD-10-CM | POA: Diagnosis not present

## 2018-04-11 DIAGNOSIS — E871 Hypo-osmolality and hyponatremia: Secondary | ICD-10-CM | POA: Diagnosis present

## 2018-04-11 DIAGNOSIS — Z9221 Personal history of antineoplastic chemotherapy: Secondary | ICD-10-CM | POA: Diagnosis not present

## 2018-04-11 DIAGNOSIS — Z79891 Long term (current) use of opiate analgesic: Secondary | ICD-10-CM | POA: Diagnosis not present

## 2018-04-11 DIAGNOSIS — I959 Hypotension, unspecified: Secondary | ICD-10-CM | POA: Diagnosis not present

## 2018-04-11 DIAGNOSIS — Z9889 Other specified postprocedural states: Secondary | ICD-10-CM | POA: Diagnosis not present

## 2018-04-11 DIAGNOSIS — I5033 Acute on chronic diastolic (congestive) heart failure: Secondary | ICD-10-CM | POA: Diagnosis present

## 2018-04-11 DIAGNOSIS — N400 Enlarged prostate without lower urinary tract symptoms: Secondary | ICD-10-CM | POA: Diagnosis present

## 2018-04-11 LAB — CBC
HEMATOCRIT: 26.8 % — AB (ref 39.0–52.0)
HEMOGLOBIN: 9.1 g/dL — AB (ref 13.0–17.0)
MCH: 31.2 pg (ref 26.0–34.0)
MCHC: 34 g/dL (ref 30.0–36.0)
MCV: 91.8 fL (ref 78.0–100.0)
Platelets: 262 10*3/uL (ref 150–400)
RBC: 2.92 MIL/uL — ABNORMAL LOW (ref 4.22–5.81)
RDW: 12.5 % (ref 11.5–15.5)
WBC: 7.1 10*3/uL (ref 4.0–10.5)

## 2018-04-11 LAB — CREATININE, SERUM
CREATININE: 0.98 mg/dL (ref 0.61–1.24)
GFR calc Af Amer: >60 mL/min (ref >60)
GFR calc non Af Amer: >60 mL/min (ref >60)

## 2018-04-11 LAB — OSMOLALITY: Osmolality: 256 mOsm/kg — ABNORMAL LOW (ref 275–295)

## 2018-04-11 LAB — BRAIN NATRIURETIC PEPTIDE: B Natriuretic Peptide: 264.2 pg/mL — ABNORMAL HIGH (ref 0.0–100.0)

## 2018-04-11 LAB — FERRITIN: FERRITIN: 661 ng/mL — AB (ref 24–336)

## 2018-04-11 LAB — IRON AND TIBC
Iron: 29 ug/dL — ABNORMAL LOW (ref 45–182)
Saturation Ratios: 12 % — ABNORMAL LOW (ref 17.9–39.5)
TIBC: 235 ug/dL — ABNORMAL LOW (ref 250–450)
UIBC: 206 ug/dL

## 2018-04-11 LAB — VITAMIN B12

## 2018-04-11 LAB — SODIUM, URINE, RANDOM: Sodium, Ur: 59 mmol/L

## 2018-04-11 LAB — OSMOLALITY, URINE: Osmolality, Ur: 303 mOsm/kg (ref 300–900)

## 2018-04-11 LAB — TROPONIN I

## 2018-04-11 LAB — TSH: TSH: 1.372 u[IU]/mL (ref 0.350–4.500)

## 2018-04-11 MED ORDER — ASPIRIN EC 81 MG PO TBEC
81.0000 mg | DELAYED_RELEASE_TABLET | Freq: Every day | ORAL | Status: DC
  Administered 2018-04-11 – 2018-04-16 (×6): 81 mg via ORAL
  Filled 2018-04-11 (×6): qty 1

## 2018-04-11 MED ORDER — ACETAMINOPHEN 325 MG PO TABS: 650.0000 mg | ORAL_TABLET | ORAL | Status: DC | PRN

## 2018-04-11 MED ORDER — FERROUS SULFATE 325 (65 FE) MG PO TABS
325.0000 mg | ORAL_TABLET | Freq: Three times a day (TID) | ORAL | Status: DC
  Administered 2018-04-11 – 2018-04-16 (×15): 325 mg via ORAL
  Filled 2018-04-11 (×18): qty 1

## 2018-04-11 MED ORDER — TAMSULOSIN HCL 0.4 MG PO CAPS
0.4000 mg | ORAL_CAPSULE | Freq: Every day | ORAL | Status: DC
  Administered 2018-04-11 – 2018-04-16 (×6): 0.4 mg via ORAL
  Filled 2018-04-11 (×6): qty 1

## 2018-04-11 MED ORDER — SODIUM CHLORIDE 0.9% FLUSH
3.0000 mL | Freq: Two times a day (BID) | INTRAVENOUS | Status: DC
  Administered 2018-04-11 – 2018-04-16 (×10): 3 mL via INTRAVENOUS

## 2018-04-11 MED ORDER — FUROSEMIDE 10 MG/ML IJ SOLN
40.0000 mg | Freq: Once | INTRAMUSCULAR | Status: AC
  Administered 2018-04-11: 40 mg via INTRAVENOUS
  Filled 2018-04-11: qty 4

## 2018-04-11 MED ORDER — POTASSIUM CHLORIDE CRYS ER 20 MEQ PO TBCR
20.0000 meq | EXTENDED_RELEASE_TABLET | Freq: Every day | ORAL | Status: DC
  Administered 2018-04-11 – 2018-04-16 (×6): 20 meq via ORAL
  Filled 2018-04-11 (×6): qty 1

## 2018-04-11 MED ORDER — SIMVASTATIN 40 MG PO TABS
40.0000 mg | ORAL_TABLET | Freq: Every evening | ORAL | Status: DC
  Administered 2018-04-11 – 2018-04-15 (×5): 40 mg via ORAL
  Filled 2018-04-11 (×6): qty 1

## 2018-04-11 MED ORDER — SODIUM CHLORIDE 0.9% FLUSH
3.0000 mL | INTRAVENOUS | Status: DC | PRN
  Administered 2018-04-11: 3 mL via INTRAVENOUS

## 2018-04-11 MED ORDER — ENOXAPARIN SODIUM 40 MG/0.4ML ~~LOC~~ SOLN
40.0000 mg | SUBCUTANEOUS | Status: DC
  Administered 2018-04-11 – 2018-04-15 (×5): 40 mg via SUBCUTANEOUS
  Filled 2018-04-11 (×6): qty 0.4

## 2018-04-11 MED ORDER — VITAMIN B-12 1000 MCG PO TABS
1000.0000 ug | ORAL_TABLET | Freq: Every day | ORAL | Status: DC
  Administered 2018-04-11 – 2018-04-16 (×6): 1000 ug via ORAL
  Filled 2018-04-11 (×7): qty 1

## 2018-04-11 MED ORDER — ONDANSETRON HCL 4 MG/2ML IJ SOLN: 4.0000 mg | Freq: Four times a day (QID) | INTRAMUSCULAR | Status: DC | PRN

## 2018-04-11 MED ORDER — FUROSEMIDE 10 MG/ML IJ SOLN
20.0000 mg | Freq: Every day | INTRAMUSCULAR | Status: DC
  Administered 2018-04-11 – 2018-04-15 (×4): 20 mg via INTRAVENOUS
  Filled 2018-04-11 (×5): qty 2

## 2018-04-11 MED ORDER — METOPROLOL TARTRATE 12.5 MG HALF TABLET
12.5000 mg | ORAL_TABLET | Freq: Two times a day (BID) | ORAL | Status: DC
  Administered 2018-04-11 – 2018-04-13 (×5): 12.5 mg via ORAL
  Filled 2018-04-11 (×5): qty 1

## 2018-04-11 MED ORDER — LEVOTHYROXINE SODIUM 50 MCG PO TABS
50.0000 ug | ORAL_TABLET | Freq: Every day | ORAL | Status: DC
  Administered 2018-04-11 – 2018-04-13 (×3): 50 ug via ORAL
  Filled 2018-04-11 (×3): qty 1

## 2018-04-11 MED ORDER — CELECOXIB 200 MG PO CAPS
200.0000 mg | ORAL_CAPSULE | Freq: Two times a day (BID) | ORAL | Status: DC
  Administered 2018-04-11 – 2018-04-14 (×7): 200 mg via ORAL
  Filled 2018-04-11 (×9): qty 1

## 2018-04-11 MED ORDER — SODIUM CHLORIDE 0.9 % IV SOLN: 250.0000 mL | INTRAVENOUS | Status: DC | PRN

## 2018-04-11 MED ORDER — IPRATROPIUM-ALBUTEROL 0.5-2.5 (3) MG/3ML IN SOLN
3.0000 mL | Freq: Four times a day (QID) | RESPIRATORY_TRACT | Status: DC | PRN
  Administered 2018-04-11: 3 mL via RESPIRATORY_TRACT
  Filled 2018-04-11 (×2): qty 3

## 2018-04-11 NOTE — Progress Notes (Signed)
Patient is an with further management.  The patient may need thoracentesis and midodrine to support the blood pressure.  Will await input from the cardiology team.-year-old male with history of diastolic congestive heart failure, chronic hyponatremia, moderate aortic stenosis and mild aortic regurgitation, sarcoidosis of the lung, hyperlipidemia, pernicious anemia, hypothyroidism, history of stomach cancer status post radiation therapy and chemotherapy, SVTs and arthritis.  The patient was admitted with acute on chronic diastolic congestive heart failure, significant left-sided pleural effusion.  Patient is currently being diuresed, however, patient's blood pressure is on the low side.  Will repeat echocardiogram.  Will consult cardiology.  Patient may need thoracentesis.  Patient may also need midodrine.  Will await cardiology input.

## 2018-04-11 NOTE — H&P (Signed)
History and Physical   [facility] [place] [facility] Admission History and Physical [employer], D.O.    Patient Name: Kyle Armstrong MR#: [id number] Date of Birth: [birth year] Date of Admission: 04/10/2018  Referring MD/NP/PA: Dr. Christy Gentles Primary Care Physician: Lawerance Cruel, MD  Chief Complaint:  Chief Complaint  Patient presents with  . Shortness of Breath    HPI: Kyle Armstrong is a 84 y.o. male with a known history of CHF, HLD presents to the emergency department for evaluation of SOB.  Patient was in a usual state of health until the last few days when the patient's is been experiencing worsening dyspnea on exertion, lower extremity edema, cough and chest pain. Patient does not give many details or answer any questions. He is hard of hearing.   Patient denies fevers/chills, weakness, dizziness, chest pain, N/V/C/D, abdominal pain, dysuria/frequency, changes in mental status.    Otherwise there has been no change in status. Patient has been taking medication as prescribed and there has been no recent change in medication or diet.  No recent antibiotics.  There has been no recent illness, hospitalizations, travel or sick contacts.    EMS/ED Course: Patient received IV Lasix. Medical admission has been requested for further management of CHF exacerbation, pleural effusion.  Review of Systems:  CONSTITUTIONAL: No fever/chills, fatigue, weakness, weight gain/loss, headache. EYES: No blurry or double vision. ENT: No tinnitus, postnasal drip, redness or soreness of the oropharynx. RESPIRATORY: Positive dyspnea. No cough, wheeze.  No hemoptysis.  CARDIOVASCULAR: No chest pain, palpitations, syncope, orthopnea. No lower extremity edema.  GASTROINTESTINAL: No nausea, vomiting, abdominal pain, diarrhea, constipation.  No hematemesis, melena or hematochezia. GENITOURINARY: No dysuria, frequency, hematuria. ENDOCRINE: No polyuria or nocturia. No heat or cold  intolerance. HEMATOLOGY: No anemia, bruising, bleeding. INTEGUMENTARY: No rashes, ulcers, lesions. MUSCULOSKELETAL: No arthritis, gout, dyspnea. NEUROLOGIC: No numbness, tingling, ataxia, seizure-type activity, weakness. PSYCHIATRIC: No anxiety, depression, insomnia.   Past Medical History:  Diagnosis Date  . Allergy   . Arthritis   . Eczema   . Gastric cancer ([place]) 10/24/2013  . Hx of radiation therapy 11/10/13-12/21/13   gastric ca, total 50.4Gy  . Hyperlipidemia   . Hypothyroidism   . Lazy eye of right side   . Pernicious anemia   . 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 EP study and ablation  07/16/12   slow pathway ablation for AVNRT by DR Allred  . HERNIA REPAIR  1980's   inguinal done x 2  . LAPAROSCOPIC CHOLECYSTECTOMY W/ CHOLANGIOGRAPHY  2011  . LAPAROSCOPIC PARTIAL GASTRECTOMY N/A 02/15/2014   Procedure: LAPAROSCOPIC diagnositc, distal gastrostomy  feeding jejunostomy;  Surgeon: Stark Klein, MD;  Location: [facility];  Service: General;  Laterality: N/A;  . LAPAROSCOPIC [place] Bilateral 2007   bil recurrent inguinal hernias  . SUPRAVENTRICULAR TACHYCARDIA ABLATION Bilateral 07/16/2012   Procedure: SUPRAVENTRICULAR TACHYCARDIA ABLATION;  Surgeon: Thompson Grayer, MD;  Location: [facility] CATH LAB;  Service: Cardiovascular;  Laterality: Bilateral;     reports that he quit smoking about 27 years ago. His smoking use included cigarettes. He has a 30.00 pack-year smoking history. He has never used smokeless tobacco. He reports that he does not drink alcohol or use drugs.  Allergies  Allergen Reactions  . Lipitor [Atorvastatin] Other (See Comments)    Leg cramps  . Amoxicillin Rash    rash  . Augmentin [  Amoxicillin-Pot Clavulanate] Rash    Family History  Problem Relation Age of Onset  . Heart attack Mother   . Heart attack Father   . Hypertension  Unknown     Prior to Admission medications   Medication Sig Start Date End Date Taking? Authorizing Provider  aspirin EC 81 MG EC tablet Take 1 tablet (81 mg total) by mouth daily. 06/25/17  Yes Rai, Ripudeep K, MD  celecoxib (CELEBREX) 200 MG capsule Take 200 mg by mouth 2 (two) times daily. 04/29/17  Yes [provider]  cyanocobalamin 1000 MCG tablet Take 1,000 mcg by mouth daily.   Yes [provider]  ferrous sulfate 325 (65 FE) MG EC tablet Take 325 mg by mouth 3 (three) times daily with meals. 06/16/14  Yes Ladell Pier, MD  furosemide (LASIX) 20 MG tablet Take 20 mg by mouth daily.   Yes [provider]  levothyroxine (SYNTHROID, LEVOTHROID) 50 MCG tablet Take 50 mcg by mouth daily before breakfast.    Yes [provider]  metoprolol tartrate (LOPRESSOR) 25 MG tablet Take 0.5 tablets (12.5 mg total) by mouth 2 (two) times daily. 06/24/17  Yes Rai, Ripudeep K, MD  potassium chloride SA (K-DUR,KLOR-CON) 20 MEQ tablet Take 1 tablet (20 mEq total) by mouth daily. 06/26/17  Yes Rai, Ripudeep K, MD  simvastatin (ZOCOR) 40 MG tablet Take 40 mg by mouth every evening.   Yes [provider]  tamsulosin (FLOMAX) 0.4 MG CAPS capsule Take 0.4 mg by mouth daily. 03/25/17  Yes [provider]  albuterol (PROVENTIL HFA;VENTOLIN HFA) 108 (90 Base) MCG/ACT inhaler Inhale 2 puffs into the lungs every 6 (six) hours as needed for wheezing or shortness of breath. Patient not taking: Reported on 07/01/2017 06/24/17   Rai, Vernelle Emerald, MD  dextromethorphan-guaiFENesin (MUCINEX DM) 30-600 MG 12hr tablet Take 1 tablet by mouth 2 (two) times daily as needed for cough. Patient not taking: Reported on 07/01/2017 06/24/17   Rai, Vernelle Emerald, MD  furosemide (LASIX) 40 MG tablet Take 1 tablet (40 mg total) by mouth daily. Patient not taking: Reported on 04/11/2018 06/24/17   Rai, Vernelle Emerald, MD  predniSONE (DELTASONE) 10 MG tablet Prednisone dosing: Take  Prednisone 30mg  (3 tabs) x 3  days, then taper to 20mg  (2 tabs) x 3days, then 10mg  (1 tab) x 3days, then OFF. Patient not taking: Reported on 04/11/2018 06/24/17   Rai, Vernelle Emerald, MD  traMADol (ULTRAM) 50 MG tablet Take 1 tablet (50 mg total) by mouth every 12 (twelve) hours as needed for severe pain. Patient not taking: Reported on 04/11/2018 06/24/17 06/24/18  Mendel Corning, MD    Physical Exam: Vitals:   04/11/18 0215 04/11/18 0230 04/11/18 0245 04/11/18 0300  BP: (!) 131/93 (!) 145/75 135/83 139/84  Pulse: 78 81 79 80  Resp: 16 14 19 18   Temp:      TempSrc:      SpO2: 100% 100% 100% 100%    GENERAL: 84 y.o.-year-old male patient, well-developed, well-nourished lying in the bed in no acute distress.  Pleasant and cooperative.   HEENT: Head atraumatic, normocephalic. Pupils equal. Mucus membranes moist. NECK: Supple, full range of motion. No JVD. CHEST:. Bibasilar crackles.No use of accessory muscles of respiration.  No reproducible chest wall tenderness.  CARDIOVASCULAR: S1, S2 normal. Systolic ejection murmur left sternal border Cap refill <2 seconds. Pulses intact distally.  ABDOMEN: Soft, nondistended, nontender. No rebound, guarding, rigidity. Normoactive bowel sounds present in all four quadrants.  EXTREMITIES: No  pedal edema, cyanosis, or clubbing. No calf tenderness or Homan's sign.  NEUROLOGIC: The patient is alert and oriented x 3. Cranial nerves II through XII are grossly intact with no focal sensorimotor deficit. PSYCHIATRIC:  Normal affect, mood, thought content. SKIN: Warm, dry, and intact without obvious rash, lesion, or ulcer.    Labs on Admission:  CBC: Recent Labs  Lab 04/10/18 2255  WBC 7.8  HGB 8.6*  HCT 25.6*  MCV 94.5  PLT 272   Basic Metabolic Panel: Recent Labs  Lab 04/10/18 2255  NA 120*  K 4.8  CL 84*  CO2 27  GLUCOSE 127*  BUN 18  CREATININE 0.97  CALCIUM 8.7*   GFR: CrCl cannot be calculated (Unknown ideal weight.). Liver Function Tests: No results for input(s):  AST, ALT, ALKPHOS, BILITOT, PROT, ALBUMIN in the last 168 hours. No results for input(s): LIPASE, AMYLASE in the last 168 hours. No results for input(s): AMMONIA in the last 168 hours. Coagulation Profile: No results for input(s): INR, PROTIME in the last 168 hours. Cardiac Enzymes: No results for input(s): CKTOTAL, CKMB, CKMBINDEX, TROPONINI in the last 168 hours. BNP (last 3 results) No results for input(s): PROBNP in the last 8760 hours. HbA1C: No results for input(s): HGBA1C in the last 72 hours. CBG: No results for input(s): GLUCAP in the last 168 hours. Lipid Profile: No results for input(s): CHOL, HDL, LDLCALC, TRIG, CHOLHDL, LDLDIRECT in the last 72 hours. Thyroid Function Tests: No results for input(s): TSH, T4TOTAL, FREET4, T3FREE, THYROIDAB in the last 72 hours. Anemia Panel: No results for input(s): VITAMINB12, FOLATE, FERRITIN, TIBC, IRON, RETICCTPCT in the last 72 hours. Urine analysis:    Component Value Date/Time   COLORURINE YELLOW 02/09/2014 [place] 02/09/2014 1317   LABSPEC 1.012 02/09/2014 1317   PHURINE 6.5 02/09/2014 1317   GLUCOSEU NEGATIVE 02/09/2014 1317   HGBUR NEGATIVE 02/09/2014 1317   BILIRUBINUR NEGATIVE 02/09/2014 1317   KETONESUR NEGATIVE 02/09/2014 1317   PROTEINUR NEGATIVE 02/09/2014 1317   UROBILINOGEN 1.0 02/09/2014 1317   NITRITE NEGATIVE 02/09/2014 1317   LEUKOCYTESUR NEGATIVE 02/09/2014 1317   Sepsis Labs: @LABRCNTIP (procalcitonin:4,lacticidven:4) )No results found for this or any previous visit (from the past 240 hour(s)).   Radiological Exams on Admission: Dg Chest 2 View  Result Date: 04/10/2018 CLINICAL DATA:  CHF, shortness of Breath EXAM: CHEST - 2 VIEW COMPARISON:  06/21/2017 FINDINGS: Enlarging left effusion, now large. Left base atelectasis. Cardiomegaly with vascular congestion. Diffuse interstitial changes throughout the lungs again noted, likely fibrotic changes. IMPRESSION: Enlarging left effusion, now  large.  Left base atelectasis. Cardiomegaly, vascular congestion. Electronically Signed   By: Rolm Baptise M.D.   On: 04/10/2018 23:19    EKG: Normal sinus rhythm at 85 bpm with normal axis and nonspecific ST-T wave changes.   Assessment/Plan  This is a 84 y.o. male with a history of CHF, HLD now being admitted with:  #. Acute exacerbation of congestive heart failure with enlarging left-sided pleural effusion - admit inpatient Telemetry monitoring. - IV Lasix - Continue Lopressor -Will need interventional radiology for consideration of drainage - Intake/output, daily weight. - Trend troponins, check lipids and TSH. - Echo - Cardiology consultation requested.  #. Hyponatremia, moderate - Fluid restriction for now due to above -Repeat BMP in a.m.  #. Anemia - check FOBT, B12, folate, ferritin and iron  #. History of hyperlipidemia - Continue Zocor  #. History of BPH - Continue Flomax  #. History of hypothyroidism - Continue Synthroid  Admission status: Inpatient, tele IV Fluids: HL Diet/Nutrition: Heart healthy Consults called: will need IR & cardiology  DVT Px: Lovenox, SCDs and early ambulation. Code Status: Full Code  Disposition Plan: To home in 2-3 days  All the records are reviewed and case discussed with ED provider. Management plans discussed with the patient and/or family who express understanding and agree with plan of care.  Libra Gatz D.O. on 04/11/2018 at 3:05 AM CC: Primary care physician; Lawerance Cruel, MD   04/11/2018, 3:05 AM

## 2018-04-11 NOTE — ED Notes (Signed)
Pt. Used bedside commode to have bowel movement. Black stool noted. Occult card collected and at bedside. MD paged. Will continue to monitor.

## 2018-04-11 NOTE — Progress Notes (Signed)
Pt received from ED with CHF exacerbation, pt is super HOH (hearing aides both sides), ordered dinner, vitals stable, BP running soft, coarse crackles and wheezing present, oxygen continue at 2l via [place], pt's daughter called and informed that pt is in [place] now, Heart Failure booklet is provided and went over it, discussed safety precautions and guidelines, will continue to monitor the patient  Palma Holter, Therapist, sports

## 2018-04-11 NOTE — ED Provider Notes (Signed)
[facility] EMERGENCY DEPARTMENT Provider Note   CSN: [id number] Arrival date & time: 04/10/18  2222     History   Chief Complaint Chief Complaint  Patient presents with  . Shortness of Breath    HPI Kyle Armstrong is a 84 y.o. male.  The history is provided by the patient and a relative.  Shortness of Breath  This is a new problem. The problem occurs frequently.The problem has been gradually worsening. Associated symptoms include cough, chest pain and leg swelling. Pertinent negatives include no fever, no hemoptysis and no abdominal pain. He has had prior hospitalizations. Associated medical issues include heart failure.  Patient with history of anemia, CHF presents with shortness of breath.  Per son, patient has had increasing dyspnea on exertion.  He is also had increasing lower extremity edema.  He is also mentioned episodes of chest pain. Patient is hard of hearing which limits exam.  Past Medical History:  Diagnosis Date  . Allergy   . Arthritis   . Eczema   . Gastric cancer ([place]) 10/24/2013  . Hx of radiation therapy 11/10/13-12/21/13   gastric ca, total 50.4Gy  . Hyperlipidemia   . Hypothyroidism   . Lazy eye of right side   . Pernicious anemia   . 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 Acute respiratory failure with hypoxia ([place]) 06/21/2017  . Sarcoidosis of lung ([place]) 06/21/2017  . Hyponatremia 06/21/2017  . Congestive heart failure ([place])   . Malfunctioning jejunostomy tube ([place]) 05/15/2014  . Acute esophagitis 02/28/2014  . Diarrhea 02/28/2014  . HOH (hard of hearing) 02/18/2014  . Severe protein-calorie malnutrition ([place]) 02/18/2014  . Jejunostomy tube present ([place]) 02/17/2014  . 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 Hyperlipidemia   . Pernicious anemia   . Hypothyroidism   .  Arthritis   . SVT (supraventricular tachycardia) ([place]) 06/23/2012    Past Surgical History:  Procedure Laterality Date  . EP study and ablation  07/16/12   slow pathway ablation for AVNRT by DR Allred  . HERNIA REPAIR  1980's   inguinal done x 2  . LAPAROSCOPIC CHOLECYSTECTOMY W/ CHOLANGIOGRAPHY  2011  . LAPAROSCOPIC PARTIAL GASTRECTOMY N/A 02/15/2014   Procedure: LAPAROSCOPIC diagnositc, distal gastrostomy  feeding jejunostomy;  Surgeon: Stark Klein, MD;  Location: [facility] ORS;  Service: General;  Laterality: N/A;  . LAPAROSCOPIC [place] Bilateral 2007   bil recurrent inguinal hernias  . SUPRAVENTRICULAR TACHYCARDIA ABLATION Bilateral 07/16/2012   Procedure: SUPRAVENTRICULAR TACHYCARDIA ABLATION;  Surgeon: Thompson Grayer, MD;  Location: [facility] CATH LAB;  Service: Cardiovascular;  Laterality: Bilateral;        Home Medications    Prior to Admission medications   Medication Sig Start Date End Date Taking? Authorizing Provider  aspirin EC 81 MG EC tablet Take 1 tablet (81 mg total) by mouth daily. 06/25/17  Yes Rai, Ripudeep K, MD  celecoxib (CELEBREX) 200 MG capsule Take 200 mg by mouth 2 (two) times daily. 04/29/17  Yes [provider]  cyanocobalamin 1000 MCG tablet Take 1,000 mcg by mouth daily.   Yes [provider]  ferrous sulfate 325 (65 FE) MG EC tablet Take 325 mg by mouth 3 (three) times daily with meals. 06/16/14  Yes Ladell Pier, MD  furosemide (LASIX) 20 MG tablet Take 20 mg by mouth daily.  Yes [provider]  levothyroxine (SYNTHROID, LEVOTHROID) 50 MCG tablet Take 50 mcg by mouth daily before breakfast.    Yes [provider]  metoprolol tartrate (LOPRESSOR) 25 MG tablet Take 0.5 tablets (12.5 mg total) by mouth 2 (two) times daily. 06/24/17  Yes Rai, Ripudeep K, MD  potassium chloride SA (K-DUR,KLOR-CON) 20 MEQ tablet Take 1 tablet (20 mEq total) by mouth daily. 06/26/17  Yes Rai, Ripudeep K, MD  simvastatin  (ZOCOR) 40 MG tablet Take 40 mg by mouth every evening.   Yes [provider]  tamsulosin (FLOMAX) 0.4 MG CAPS capsule Take 0.4 mg by mouth daily. 03/25/17  Yes [provider]  albuterol (PROVENTIL HFA;VENTOLIN HFA) 108 (90 Base) MCG/ACT inhaler Inhale 2 puffs into the lungs every 6 (six) hours as needed for wheezing or shortness of breath. Patient not taking: Reported on 07/01/2017 06/24/17   Rai, Vernelle Emerald, MD  dextromethorphan-guaiFENesin (MUCINEX DM) 30-600 MG 12hr tablet Take 1 tablet by mouth 2 (two) times daily as needed for cough. Patient not taking: Reported on 07/01/2017 06/24/17   Rai, Vernelle Emerald, MD  furosemide (LASIX) 40 MG tablet Take 1 tablet (40 mg total) by mouth daily. Patient not taking: Reported on 04/11/2018 06/24/17   Rai, Vernelle Emerald, MD  predniSONE (DELTASONE) 10 MG tablet Prednisone dosing: Take  Prednisone 30mg  (3 tabs) x 3 days, then taper to 20mg  (2 tabs) x 3days, then 10mg  (1 tab) x 3days, then OFF. Patient not taking: Reported on 04/11/2018 06/24/17   Rai, Vernelle Emerald, MD  traMADol (ULTRAM) 50 MG tablet Take 1 tablet (50 mg total) by mouth every 12 (twelve) hours as needed for severe pain. Patient not taking: Reported on 04/11/2018 06/24/17 06/24/18  Mendel Corning, MD    Family History Family History  Problem Relation Age of Onset  . Heart attack Mother   . Heart attack Father   . Hypertension Unknown     Social History Social History   Tobacco Use  . Smoking status: Former Smoker    Packs/day: 1.00    Years: 30.00    Pack years: 30.00    Types: Cigarettes    Last attempt to quit: 11/24/1990    Years since quitting: 27.3  . Smokeless tobacco: Never Used  Substance Use Topics  . Alcohol use: No  . Drug use: No     Allergies   Lipitor [atorvastatin]; Amoxicillin; and Augmentin [amoxicillin-pot clavulanate]   Review of Systems Review of Systems  Constitutional: Positive for fatigue. Negative for fever.  Respiratory: Positive for cough and  shortness of breath. Negative for hemoptysis.   Cardiovascular: Positive for chest pain and leg swelling.  Gastrointestinal: Negative for abdominal pain.  All other systems reviewed and are negative.    Physical Exam Updated Vital Signs BP 118/82   Pulse 75   Temp 98 F (36.7 C) (Oral)   Resp 17   SpO2 100%   Physical Exam  CONSTITUTIONAL: Elderly and ill-appearing, he is hard of hearing HEAD: Normocephalic/atraumatic EYES: EOMI/PERRL ENMT: Mucous membranes moist NECK: supple no meningeal signs SPINE/BACK:entire spine nontender CV: S1/S2 noted, murmur noted LUNGS: Mild tachypnea, decreased breath sounds bilaterally, crackles noted ABDOMEN: soft, nontender GU:no cva tenderness NEURO: Pt is awake/alert/appropriate, moves all extremitiesx4.  No facial droop.   EXTREMITIES: pulses normal/equal, full ROM, symmetric pitting edema to bilateral lower extremity SKIN: warm, color normal PSYCH: no abnormalities of mood noted, alert and oriented to situation  ED Treatments / Results  Labs (all labs ordered  are listed, but only abnormal results are displayed) Labs Reviewed  BASIC METABOLIC PANEL - Abnormal; Notable for the following components:      Result Value   Sodium 120 (*)    Chloride 84 (*)    Glucose, Bld 127 (*)    Calcium 8.7 (*)    All other components within normal limits  CBC - Abnormal; Notable for the following components:   RBC 2.71 (*)    Hemoglobin 8.6 (*)    HCT 25.6 (*)    All other components within normal limits  BRAIN NATRIURETIC PEPTIDE - Abnormal; Notable for the following components:   B Natriuretic Peptide 264.2 (*)    All other components within normal limits  I-STAT TROPONIN, ED    EKG EKG Interpretation  Date/Time:  Saturday Apr 10 2018 22:26:33 EDT Ventricular Rate:  85 PR Interval:  184 QRS Duration: 108 QT Interval:  362 QTC Calculation: 430 R Axis:   -15 Text Interpretation:  Normal sinus rhythm Low voltage QRS Incomplete right  bundle branch block Borderline ECG Confirmed by Ripley Fraise [phone]) on 04/11/2018 2:11:36 AM   Radiology Dg Chest 2 View  Result Date: 04/10/2018 CLINICAL DATA:  CHF, shortness of Breath EXAM: CHEST - 2 VIEW COMPARISON:  06/21/2017 FINDINGS: Enlarging left effusion, now large. Left base atelectasis. Cardiomegaly with vascular congestion. Diffuse interstitial changes throughout the lungs again noted, likely fibrotic changes. IMPRESSION: Enlarging left effusion, now large.  Left base atelectasis. Cardiomegaly, vascular congestion. Electronically Signed   By: Rolm Baptise M.D.   On: 04/10/2018 23:19    Procedures Procedures (including critical care time)  Medications Ordered in ED Medications  furosemide (LASIX) injection 40 mg (40 mg Intravenous Given 04/11/18 0234)     Initial Impression / Assessment and Plan / ED Course  I have reviewed the triage vital signs and the nursing notes.  Pertinent labs & imaging results that were available during my care of the patient were reviewed by me and considered in my medical decision making (see chart for details).     Patient with increasing shortness of breath for the past several days.  He has a worsening left pleural effusion which is contributing to his symptoms.  I feel he should be admitted for diuresis, and may benefit from thoracentesis Patient also with worsening hyponatremia, he will need to have this addressed in the hospital 3:33 AM Patient stabilized in the emerge department.  I discussed the case with the hospitalist Final Clinical Impressions(s) / ED Diagnoses   Final diagnoses:  Pleural effusion  Acute systolic congestive heart failure [facility])  Hyponatremia    ED Discharge Orders    None       Ripley Fraise, MD 04/11/18 [phone]

## 2018-04-11 NOTE — ED Notes (Signed)
Delay in lab draew,  Pt using urinal

## 2018-04-11 NOTE — ED Notes (Signed)
Attempted to call report

## 2018-04-11 NOTE — ED Notes (Signed)
Patient and family are frustrated they do not have a bed assigned at this time. Talked with them concerning availability and will keep them informed.

## 2018-04-11 NOTE — ED Notes (Signed)
Admitting MD aware of sodium and chloride. No further orders at this time. Will continue to monitor.

## 2018-04-12 ENCOUNTER — Inpatient Hospital Stay (HOSPITAL_COMMUNITY): Payer: Medicare HMO

## 2018-04-12 ENCOUNTER — Other Ambulatory Visit (HOSPITAL_COMMUNITY): Payer: 59

## 2018-04-12 DIAGNOSIS — E871 Hypo-osmolality and hyponatremia: Secondary | ICD-10-CM

## 2018-04-12 DIAGNOSIS — R0602 Shortness of breath: Secondary | ICD-10-CM

## 2018-04-12 DIAGNOSIS — I5033 Acute on chronic diastolic (congestive) heart failure: Secondary | ICD-10-CM

## 2018-04-12 DIAGNOSIS — I5031 Acute diastolic (congestive) heart failure: Secondary | ICD-10-CM

## 2018-04-12 DIAGNOSIS — I350 Nonrheumatic aortic (valve) stenosis: Secondary | ICD-10-CM

## 2018-04-12 LAB — BASIC METABOLIC PANEL
Anion gap: 8 (ref 5–15)
BUN: 15 mg/dL (ref 6–20)
CALCIUM: 8.7 mg/dL — AB (ref 8.9–10.3)
CHLORIDE: 80 mmol/L — AB (ref 101–111)
CO2: 31 mmol/L (ref 22–32)
CREATININE: 0.9 mg/dL (ref 0.61–1.24)
Glucose, Bld: 97 mg/dL (ref 65–99)
Potassium: 4.3 mmol/L (ref 3.5–5.1)
SODIUM: 119 mmol/L — AB (ref 135–145)

## 2018-04-12 LAB — FOLATE RBC
FOLATE, HEMOLYSATE: 448.6 ng/mL
Folate, RBC: 1674 ng/mL (ref >498)
Hematocrit: 26.8 % — ABNORMAL LOW (ref 37.5–51.0)

## 2018-04-12 LAB — ECHOCARDIOGRAM COMPLETE
Height: 68 in
Weight: 2468.8 oz

## 2018-04-12 NOTE — Progress Notes (Signed)
  Echocardiogram 2D Echocardiogram has been performed.  Merrie Roof F 04/12/2018, 4:04 PM

## 2018-04-12 NOTE — Care Management Note (Signed)
Case Management Note  Patient Details  Name: Kyle Armstrong MRN: [id number] Date of Birth: 05-Jan-1930  Subjective/Objective:       CHF            Action/Plan: Patient lives at home; Primary Care Physician: Lawerance Cruel, MD; has private insurance with [employer] with prescription drug coverage; has home oxygen; awaiting for Physical Therapy eval for disposition needs.   Expected Discharge Date:    possibly 04/15/2018              Expected Discharge Plan:  [place]  In-House Referral:   [facility]  Discharge planning Services  CM Consult  Status of Service:  In process, will continue to follow  Sherrilyn Rist [phone] 04/12/2018, 10:16 AM

## 2018-04-12 NOTE — Consult Note (Addendum)
Cardiology Consultation:   Patient ID: [name]; [id number]; [birth year]   Admit date: 04/10/2018 Date of Consult: 04/12/2018  Primary Care Provider: Lawerance Cruel, MD Primary Cardiologist: Larae Grooms, MD  Primary Electrophysiologist:  Dr. Rayann Heman   Patient Profile:   Kyle Armstrong is a 84 y.o. male with a hx of hard of hearing, SVT/AVNRT status post ablation, anemia, chronic diastolic heart failure, hypothyroidism and hyperlipidemia who is being seen today for the evaluation of acute on chronic diastolic heart failure at the request of Dr. Marthenia Rolling.  History of Present Illness:   Kyle Armstrong is a 84 year old male with past medical history of hard of hearing, SVT/AVNRT status post ablation, anemia, chronic diastolic heart failure, hypothyroidism and hyperlipidemia.  Patient was previously admitted to [facility] in August 2018 with acute on chronic diastolic heart failure.  BNP at the time was 843.  Echocardiogram showed EF 55 to 60%, moderately to severely calcified aortic valve with moderate aortic stenosis and mild AI.  He was treated with IV diuretic.  His discharge dry weight was 170 pounds.  During the hospital course, it was also mentioned that he is not considered a candidate for aortic valve replacement given his multiple comorbidities.  He was seen by Kennon Portela PA-C on 07/01/2017.  He was stable at the time on 40 mg daily of Lasix.  He has a history of gastric cancer and chronic anemia as well.  According to his oncologist Dr. Benay Spice, it was felt that his anemia was likely secondary to a combination of GI blood loss, surgery, chemotherapy/radiation therapy and malnutrition.   He returned to the hospital on 04/10/2018 with complaint of worsening shortness of breath.  BNP was 264.  Chest x-ray showed enlarging left pleural effusion.  Initial hemoglobin was 8.6.  EKG showed normal sinus rhythm.  TSH was normal.  Initial sodium was 120, subsequent sodium was  119.  He has been placed on fluid restriction.  He is also undergoing diuresis with 20 mg twice daily of IV Lasix.   Past Medical History:  Diagnosis Date  . Allergy   . Arthritis   . Eczema   . Gastric cancer ([place]) 10/24/2013  . Hx of radiation therapy 11/10/13-12/21/13   gastric ca, total 50.4Gy  . Hyperlipidemia   . Hypothyroidism   . Lazy eye of right side   . Pernicious anemia   . 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 EP study and ablation  07/16/12   slow pathway ablation for AVNRT by DR Allred  . HERNIA REPAIR  1980's   inguinal done x 2  . LAPAROSCOPIC CHOLECYSTECTOMY W/ CHOLANGIOGRAPHY  2011  . LAPAROSCOPIC PARTIAL GASTRECTOMY N/A 02/15/2014   Procedure: LAPAROSCOPIC diagnositc, distal gastrostomy  feeding jejunostomy;  Surgeon: Stark Klein, MD;  Location: [facility] ORS;  Service: General;  Laterality: N/A;  . LAPAROSCOPIC [place] Bilateral 2007   bil recurrent inguinal hernias  . SUPRAVENTRICULAR TACHYCARDIA ABLATION Bilateral 07/16/2012   Procedure: SUPRAVENTRICULAR TACHYCARDIA ABLATION;  Surgeon: Thompson Grayer, MD;  Location: [facility] CATH LAB;  Service: Cardiovascular;  Laterality: Bilateral;     Home Medications:  Prior to Admission medications   Medication Sig Start Date End Date Taking? Authorizing Provider  aspirin EC 81 MG EC tablet Take 1 tablet (81 mg total) by mouth daily. 06/25/17  Yes Rai, Ripudeep K, MD  celecoxib (CELEBREX) 200 MG capsule  Take 200 mg by mouth 2 (two) times daily. 04/29/17  Yes [provider]  cyanocobalamin 1000 MCG tablet Take 1,000 mcg by mouth daily.   Yes [provider]  ferrous sulfate 325 (65 FE) MG EC tablet Take 325 mg by mouth 3 (three) times daily with meals. 06/16/14  Yes Ladell Pier, MD  furosemide (LASIX) 20 MG tablet Take 20 mg by mouth daily.   Yes [provider]  levothyroxine  (SYNTHROID, LEVOTHROID) 50 MCG tablet Take 50 mcg by mouth daily before breakfast.    Yes [provider]  metoprolol tartrate (LOPRESSOR) 25 MG tablet Take 0.5 tablets (12.5 mg total) by mouth 2 (two) times daily. 06/24/17  Yes Rai, Ripudeep K, MD  potassium chloride SA (K-DUR,KLOR-CON) 20 MEQ tablet Take 1 tablet (20 mEq total) by mouth daily. 06/26/17  Yes Rai, Ripudeep K, MD  simvastatin (ZOCOR) 40 MG tablet Take 40 mg by mouth every evening.   Yes [provider]  tamsulosin (FLOMAX) 0.4 MG CAPS capsule Take 0.4 mg by mouth daily. 03/25/17  Yes [provider]  albuterol (PROVENTIL HFA;VENTOLIN HFA) 108 (90 Base) MCG/ACT inhaler Inhale 2 puffs into the lungs every 6 (six) hours as needed for wheezing or shortness of breath. Patient not taking: Reported on 07/01/2017 06/24/17   Rai, Vernelle Emerald, MD  furosemide (LASIX) 40 MG tablet Take 1 tablet (40 mg total) by mouth daily. Patient not taking: Reported on 04/11/2018 06/24/17   Mendel Corning, MD    Inpatient Medications: Scheduled Meds: . aspirin EC  81 mg Oral Daily  . celecoxib  200 mg Oral BID  . enoxaparin (LOVENOX) injection  40 mg Subcutaneous Q24H  . ferrous sulfate  325 mg Oral TID WC  . furosemide  20 mg Intravenous Daily  . levothyroxine  50 mcg Oral QAC breakfast  . metoprolol tartrate  12.5 mg Oral BID  . potassium chloride SA  20 mEq Oral Daily  . simvastatin  40 mg Oral QPM  . sodium chloride flush  3 mL Intravenous Q12H  . tamsulosin  0.4 mg Oral Daily  . cyanocobalamin  1,000 mcg Oral Daily   Continuous Infusions: . sodium chloride     PRN Meds: sodium chloride, acetaminophen, ipratropium-albuterol, ondansetron (ZOFRAN) IV, sodium chloride flush  Allergies:    Allergies  Allergen Reactions  . Lipitor [Atorvastatin] Other (See Comments)    Leg cramps  . Amoxicillin Rash    rash  . Augmentin [Amoxicillin-Pot Clavulanate] Rash    Social History:   Social History   Socioeconomic History  .  Marital status: Widowed    Spouse name: Not on file  . Number of children: 2  . Years of education: Not on file  . Highest education level: Not on file  Occupational History  . Not on file  Social Needs  . Financial resource strain: Not on file  . Food insecurity:    Worry: Not on file    Inability: Not on file  . Transportation needs:    Medical: Not on file    Non-medical: Not on file  Tobacco Use  . Smoking status: Former Smoker    Packs/day: 1.00    Years: 30.00    Pack years: 30.00    Types: Cigarettes    Last attempt to quit: 11/24/1990    Years since quitting: 27.4  . Smokeless tobacco: Never Used  Substance and Sexual Activity  . Alcohol use: No  . Drug use: No  .  Sexual activity: Never  Lifestyle  . Physical activity:    Days per week: Not on file    Minutes per session: Not on file  . Stress: Not on file  Relationships  . Social connections:    Talks on phone: Not on file    Gets together: Not on file    Attends religious service: Not on file    Active member of club or organization: Not on file    Attends meetings of clubs or organizations: Not on file    Relationship status: Not on file  . Intimate partner violence:    Fear of current or ex partner: Not on file    Emotionally abused: Not on file    Physically abused: Not on file    Forced sexual activity: Not on file  Other Topics Concern  . Not on file  Social History Narrative   Widowed, wife died in Feb 23, 2011 (prior breast cancer patient)   Retired from [employer]   Currently works 3 days/week (4 hours) at [employer] alone, drives    Family History:    Family History  Problem Relation Age of Onset  . Heart attack Mother   . Heart attack Father   . Hypertension Unknown      ROS:  Please see the history of present illness.   All other ROS reviewed and negative.     Physical Exam/Data:   Vitals:   04/11/18 2011/02/23 04/12/18 0624 04/12/18 0849 04/12/18 1153  BP: 113/74 109/73 100/70 102/65    Pulse: 79 75 83 63  Resp: 18 18 18 20   Temp: 98 F (36.7 C) 98.2 F (36.8 C) (!) 97.5 F (36.4 C) 98.1 F (36.7 C)  TempSrc: Oral Oral Oral Oral  SpO2: 99% 99% 98% 99%  Weight:  154 lb 4.8 oz (70 kg)    Height:        Intake/Output Summary (Last 24 hours) at 04/12/2018 1327 Last data filed at 04/12/2018 1100 Gross per 24 hour  Intake 360 ml  Output 1540 ml  Net -1180 ml   Filed Weights   04/11/18 1612 04/12/18 0624  Weight: 154 lb 12.8 oz (70.2 kg) 154 lb 4.8 oz (70 kg)   Body mass index is 23.46 kg/m.  General:  Well nourished, well developed, in no acute distress HEENT: normal Lymph: no adenopathy Neck: no JVD Endocrine:  No thryomegaly Vascular: No carotid bruits; FA pulses 2+ bilaterally without bruits  Cardiac:  normal S1, S2; RRR; 2 out of 6 systolic murmur near the apex and also right upper sternal border Lungs: Markedly diminished breath sound in the left lung Abd: soft, nontender, no hepatomegaly  Ext: 2-3+ pitting edema in the left lower extremity, 2+ pitting edema in the right lower extremity Musculoskeletal:  No deformities, BUE and BLE strength normal and equal Skin: warm and dry  Neuro:  CNs 2-12 intact, no focal abnormalities noted Psych:  Normal affect   EKG:  The EKG was personally reviewed and demonstrates:  Normal sinus rhythm, no significant ST-T wave changes Telemetry:  Telemetry was personally reviewed and demonstrates:    Relevant CV Studies:  Echo 06/21/2017 LV EF: 55% -   60% Study Conclusions  - Left ventricle: The cavity size was normal. Wall thickness was   increased in a pattern of mild LVH. Systolic function was normal.   The estimated ejection fraction was in the range of 55% to 60%.   Although no diagnostic regional wall motion abnormality  was   identified, this possibility cannot be completely excluded on the   basis of this study. The study is not technically sufficient to   allow evaluation of LV diastolic function. - Aortic  valve: Moderately to severely calcified annulus.   Functionally bicuspid; moderately calcified leaflets. There was   moderate stenosis. There was mild regurgitation. Mean gradient   (S): 11 mm Hg. Peak gradient (S): 19 mm Hg. VTI ratio of LVOT to   aortic valve: 0.41. Valve area (VTI): 1.28 cm^2. Valve area   (Vmax): 1.29 cm^2. - Mitral valve: Calcified annulus. Mildly calcified leaflets .   There was mild to moderate regurgitation. - Left atrium: The atrium was severely dilated. - Atrial septum: No defect or patent foramen ovale was identified. - Tricuspid valve: There was mild regurgitation. - Pericardium, extracardiac: There was no pericardial effusion.  Impressions:  - Mild LVH with LVEF 55-60%. Indeterminate diastolic function.   Severe left atrial enlargement. Mitral annular calcification with   mildly calcified leaflets and mild to moderate mitral   regurgitation. Aortic valve appears to be bicuspid with moderate   calcification and moderate to severe annular calcification. There   is moderate aortic stenosis and mild aortic regurgitation. Mild   tricuspid regurgitation.  Laboratory Data:  Chemistry Recent Labs  Lab 04/10/18 2255 04/11/18 0502 04/12/18 6283  NA 120*  --  119*  K 4.8  --  4.3  CL 84*  --  80*  CO2 27  --  31  GLUCOSE 127*  --  97  BUN 18  --  15  CREATININE 0.97 0.98 0.90  CALCIUM 8.7*  --  8.7*  GFRNONAA >60 >60 >60  GFRAA >60 >60 >60  ANIONGAP 9  --  8    No results for input(s): PROT, ALBUMIN, AST, ALT, ALKPHOS, BILITOT in the last 168 hours. Hematology Recent Labs  Lab 04/10/18 2255 04/11/18 0502  WBC 7.8 7.1  RBC 2.71* 2.92*  HGB 8.6* 9.1*  HCT 25.6* 26.8*  MCV 94.5 91.8  MCH 31.7 31.2  MCHC 33.6 34.0  RDW 12.6 12.5  PLT 243 262   Cardiac Enzymes Recent Labs  Lab 04/11/18 0502 04/11/18 1054 04/11/18 1627  TROPONINI <0.03 <0.03 <0.03    Recent Labs  Lab 04/10/18 2309  TROPIPOC 0.00    BNP Recent Labs  Lab  04/10/18 2255  BNP 264.2*    DDimer No results for input(s): DDIMER in the last 168 hours.  Radiology/Studies:  Dg Chest 2 View  Result Date: 04/10/2018 CLINICAL DATA:  CHF, shortness of Breath EXAM: CHEST - 2 VIEW COMPARISON:  06/21/2017 FINDINGS: Enlarging left effusion, now large. Left base atelectasis. Cardiomegaly with vascular congestion. Diffuse interstitial changes throughout the lungs again noted, likely fibrotic changes. IMPRESSION: Enlarging left effusion, now large.  Left base atelectasis. Cardiomegaly, vascular congestion. Electronically Signed   By: Rolm Baptise M.D.   On: 04/10/2018 23:19    Assessment and Plan:   1. Acute on chronic diastolic heart failure  -He was on 40 mg daily of Lasix in August 2018, however since then his home Lasix has been decreased to 20 mg daily.  -He presented this time with acute on chronic diastolic heart failure, will need repeat echocardiogram to reassess aortic valve stenosis to make sure that his current acute CHF exacerbation is not due to worsening valve issue.  -He is now on 20 mg twice daily of IV Lasix with good urinary output.  Given significant hyponatremia, continue fluid restriction.  Likely will go back to 40 mg daily of Lasix on discharge. Check albumin tomorrow AM.   2. Large left pleural effusion: Will require thoracentesis   3. moderate aortic stenosis: repeat echocardiogram  4. Severe hyponatremia: Previous sodium level was 122-129 back in July 2018, sodium today is 119. Fluid restriction to <1500 per day. If sodium level does not increase, consider Tolvaptan, however avoid raising Na by more than 8 mEq/L per 24 hours due to OSD   5. Anemia: followed by Dr. Benay Spice of oncology, felt to be due to combination of chronic GI loss, radiation/chemo, surgery and malnutrition  6. Hyperlipidemia: on Zocor  7. Hypothyroidism: on synthroid, TSH normal.   8. History of SVT s/p ablation: no recurrence   For questions or updates,  please contact Kyle Armstrong Please consult [email or web address] for contact info under Cardiology/STEMI.   Kyle Armstrong, Kyle Armstrong  04/12/2018 1:27 PM   History and all data above reviewed.  Patient examined.  I agree with the findings as above.  The patient presents with chronic dyspnea.  He finally came to the ED after he was audibly wheezing while seated at home.  He has been chronically SOB.  We are asked to see for evaluation of HF and pleural effusion.  He has an large left pleural effusion.  He does have moderate aortic stenosis.  He has had well preserved EF . He has a history of diastolic HF but has not had frequent visits in in our clinic.  He has had a history of gastric cancer which was in remission after surgery.  He has had weight loss that sounds like it has been about 20 lbs over a few months.  He doesn't eat very much after partial gastrectomy.  Of note has severe hyponatremia.  He is not describing PND or orthopnea.  He does have some mild ankle and calf edema The patient exam reveals COR:RRR, 3/6 apical systolic murmur radiating out the aortic outflow tract.   No diastolic murmurs  ,  Lungs: Decreased breath sounds right greater than left  ,  Abd: Positive bowel sounds, no rebound no guarding, Ext Mild/mod edema  .  All available labs, radiology testing, previous records reviewed. Agree with documented assessment and plan.   Acute on chronic diastolic HF:  This likely plays some role in his the overall picture.  However, I do not think that that we can assume that CHF and volume overload is the cause of the hyponatremia.  He is not massively volume overloaded.  I agree with further work up as you are doing.  Question SIADH.  For now fluid restrict.  I would not pursue aggressive diuresis.  I would suggest thoracentesis for diagnosis and symptom relief.  Question the role that malnutrition or even the distant gastric cancer might be playing this.  I did see the images from the echo.  Preserved EF  with moderate AS.  I will follow with you.  Thanks.     Kyle Armstrong  4:19 PM  04/12/2018

## 2018-04-12 NOTE — Progress Notes (Signed)
PROGRESS NOTE    Kyle Armstrong  XNA:[id number] DOB: 20-Nov-1930 DOA: 04/10/2018 PCP: Lawerance Cruel, MD  Outpatient Specialists:     Brief Narrative: Patient is an 84 year old male with history of diastolic congestive heart failure, chronic hyponatremia, moderate aortic stenosis and mild aortic regurgitation, sarcoidosis of the lung, hyperlipidemia, pernicious anemia, hypothyroidism, history of stomach cancer status post radiation therapy and chemotherapy, SVTs and arthritis.  The patient was admitted with worsening SOB, possibly acute on chronic diastolic congestive heart failure, significant left-sided pleural effusion. Patient's BP has been on the low side, therefore, limiting aggressiveness of diuresis. Cardiology team has been consulted. ECHO revealed EF of 54-27%, Grade 1 diastolic dysfunction and mild aortic stenosis. Thoracentesis and pleural fluid analysis has been ordered.  Assessment & Plan:   Active Problems:   Acute congestive heart failure (HCC)    #. Acute on chronic diastoli congestive heart failure:  - IV Lasix - Continue Lopressor - Intake/output, daily weight. - Echo result as above. - Await Cardiology input.   - Low BP limits aggressiveness of diuresis.  #Large left-sided pleural effusion: -For Thoracentesis and analysis.  #. Hyponatremia, moderate - Chronic, suspect SIADH  -Sodium is 119 today.  #. Anemia - Follow FOBT, B12, folate, ferritin and iron  #. History of hyperlipidemia - Continue Zocor  #. History of BPH - Continue Flomax  #. History of hypothyroidism - Continue Synthroid  Admission status: Inpatient, tele Diet/Nutrition: Heart healthy Consults called: IR & cardiology  DVT Px: Lovenox, SCDs and early ambulation. Code Status: Full Code    Procedures:   For thoracentesis  Echo  Antimicrobials:   None    Subjective: No new complaints. No worsening of SOB. No chest pain, no fever or chills.  Objective: Vitals:     04/11/18 2012 04/12/18 0624 04/12/18 0849 04/12/18 1153  BP: 113/74 109/73 100/70 102/65  Pulse: 79 75 83 63  Resp: 18 18 18 20   Temp: 98 F (36.7 C) 98.2 F (36.8 C) (!) 97.5 F (36.4 C) 98.1 F (36.7 C)  TempSrc: Oral Oral Oral Oral  SpO2: 99% 99% 98% 99%  Weight:  70 kg (154 lb 4.8 oz)    Height:        Intake/Output Summary (Last 24 hours) at 04/12/2018 1833 Last data filed at 04/12/2018 1341 Gross per 24 hour  Intake 360 ml  Output 800 ml  Net -440 ml   Filed Weights   04/11/18 1612 04/12/18 0624  Weight: 70.2 kg (154 lb 12.8 oz) 70 kg (154 lb 4.8 oz)    Examination:  General exam: Appears calm and comfortable. Cachectic Respiratory system: Decreased air entry. Cardiovascular system: S1 & S2. Gastrointestinal system: Abdomen is nondistended, soft and nontender. No organomegaly or masses felt. Normal bowel sounds heard. Central nervous system: Alert and oriented. Patient moves all limbs Extremities: 1-1+ bilateral leg edema  Data Reviewed: I have personally reviewed following labs and imaging studies  CBC: Recent Labs  Lab 04/10/18 2255 04/11/18 0502  WBC 7.8 7.1  HGB 8.6* 9.1*  HCT 25.6* 26.8*  26.8*  MCV 94.5 91.8  PLT 243 062   Basic Metabolic Panel: Recent Labs  Lab 04/10/18 2255 04/11/18 0502 04/12/18 0608  NA 120*  --  119*  K 4.8  --  4.3  CL 84*  --  80*  CO2 27  --  31  GLUCOSE 127*  --  97  BUN 18  --  15  CREATININE 0.97 0.98 0.90  CALCIUM 8.7*  --  8.7*   GFR: Estimated Creatinine Clearance: 57 mL/min (by C-G formula based on SCr of 0.9 mg/dL). Liver Function Tests: No results for input(s): AST, ALT, ALKPHOS, BILITOT, PROT, ALBUMIN in the last 168 hours. No results for input(s): LIPASE, AMYLASE in the last 168 hours. No results for input(s): AMMONIA in the last 168 hours. Coagulation Profile: No results for input(s): INR, PROTIME in the last 168 hours. Cardiac Enzymes: Recent Labs  Lab 04/11/18 0502 04/11/18 1054  04/11/18 1627  TROPONINI <0.03 <0.03 <0.03   BNP (last 3 results) No results for input(s): PROBNP in the last 8760 hours. HbA1C: No results for input(s): HGBA1C in the last 72 hours. CBG: No results for input(s): GLUCAP in the last 168 hours. Lipid Profile: No results for input(s): CHOL, HDL, LDLCALC, TRIG, CHOLHDL, LDLDIRECT in the last 72 hours. Thyroid Function Tests: Recent Labs    04/11/18 0502  TSH 1.372   Anemia Panel: Recent Labs    04/11/18 0502  VITAMINB12 >7,500*  FERRITIN 661*  TIBC 235*  IRON 29*   Urine analysis:    Component Value Date/Time   COLORURINE YELLOW 02/09/2014 [place] 02/09/2014 1317   LABSPEC 1.012 02/09/2014 1317   PHURINE 6.5 02/09/2014 1317   GLUCOSEU NEGATIVE 02/09/2014 1317   HGBUR NEGATIVE 02/09/2014 1317   BILIRUBINUR NEGATIVE 02/09/2014 1317   KETONESUR NEGATIVE 02/09/2014 1317   PROTEINUR NEGATIVE 02/09/2014 1317   UROBILINOGEN 1.0 02/09/2014 1317   NITRITE NEGATIVE 02/09/2014 1317   LEUKOCYTESUR NEGATIVE 02/09/2014 1317   Sepsis Labs: @LABRCNTIP (procalcitonin:4,lacticidven:4)  )No results found for this or any previous visit (from the past 240 hour(s)).       Radiology Studies: Dg Chest 2 View  Result Date: 04/10/2018 CLINICAL DATA:  CHF, shortness of Breath EXAM: CHEST - 2 VIEW COMPARISON:  06/21/2017 FINDINGS: Enlarging left effusion, now large. Left base atelectasis. Cardiomegaly with vascular congestion. Diffuse interstitial changes throughout the lungs again noted, likely fibrotic changes. IMPRESSION: Enlarging left effusion, now large.  Left base atelectasis. Cardiomegaly, vascular congestion. Electronically Signed   By: Rolm Baptise M.D.   On: 04/10/2018 23:19        Scheduled Meds: . aspirin EC  81 mg Oral Daily  . celecoxib  200 mg Oral BID  . enoxaparin (LOVENOX) injection  40 mg Subcutaneous Q24H  . ferrous sulfate  325 mg Oral TID WC  . furosemide  20 mg Intravenous Daily  .  levothyroxine  50 mcg Oral QAC breakfast  . metoprolol tartrate  12.5 mg Oral BID  . potassium chloride SA  20 mEq Oral Daily  . simvastatin  40 mg Oral QPM  . sodium chloride flush  3 mL Intravenous Q12H  . tamsulosin  0.4 mg Oral Daily  . cyanocobalamin  1,000 mcg Oral Daily   Continuous Infusions: . sodium chloride       LOS: 1 day    Time spent: 35 Minutes.    Dana Allan, MD  [facility] Pager #: [phone] 7PM-7AM contact night coverage as above

## 2018-04-12 NOTE — Progress Notes (Signed)
Lab called in critical Na of 119 for patient, Dr. Marthenia Rolling page through [facility] with lab value.

## 2018-04-12 NOTE — Plan of Care (Signed)
  Problem: Activity: Goal: Capacity to carry out activities will improve Outcome: Progressing   Problem: Education: Goal: Ability to demonstrate management of disease process will improve Outcome: Progressing   Problem: Education: Goal: Ability to verbalize understanding of medication therapies will improve Outcome: Progressing

## 2018-04-13 ENCOUNTER — Inpatient Hospital Stay (HOSPITAL_COMMUNITY): Payer: Medicare HMO

## 2018-04-13 ENCOUNTER — Encounter (HOSPITAL_COMMUNITY): Payer: Self-pay | Admitting: Cardiology

## 2018-04-13 DIAGNOSIS — Z9889 Other specified postprocedural states: Secondary | ICD-10-CM

## 2018-04-13 DIAGNOSIS — I5021 Acute systolic (congestive) heart failure: Secondary | ICD-10-CM

## 2018-04-13 HISTORY — PX: IR THORACENTESIS ASP PLEURAL SPACE W/IMG GUIDE: IMG5380

## 2018-04-13 LAB — BASIC METABOLIC PANEL
ANION GAP: 10 (ref 5–15)
BUN: 15 mg/dL (ref 6–20)
CALCIUM: 8.3 mg/dL — AB (ref 8.9–10.3)
CO2: 30 mmol/L (ref 22–32)
Chloride: 79 mmol/L — ABNORMAL LOW (ref 101–111)
Creatinine, Ser: 0.86 mg/dL (ref 0.61–1.24)
Glucose, Bld: 93 mg/dL (ref 65–99)
Potassium: 4.2 mmol/L (ref 3.5–5.1)
Sodium: 119 mmol/L — CL (ref 135–145)

## 2018-04-13 LAB — BODY FLUID CELL COUNT WITH DIFFERENTIAL
Eos, Fluid: 6 %
Lymphs, Fluid: 84 %
Monocyte-Macrophage-Serous Fluid: 3 % — ABNORMAL LOW (ref 50–90)
Neutrophil Count, Fluid: 7 % (ref 0–25)
Total Nucleated Cell Count, Fluid: 384 cu mm (ref 0–1000)

## 2018-04-13 LAB — BRAIN NATRIURETIC PEPTIDE: B Natriuretic Peptide: 148.2 pg/mL — ABNORMAL HIGH (ref 0.0–100.0)

## 2018-04-13 LAB — GLUCOSE, PLEURAL OR PERITONEAL FLUID: Glucose, Fluid: 87 mg/dL

## 2018-04-13 LAB — HEPATIC FUNCTION PANEL
ALT: 9 U/L — ABNORMAL LOW (ref 17–63)
AST: 22 U/L (ref 15–41)
Albumin: 2.6 g/dL — ABNORMAL LOW (ref 3.5–5.0)
Alkaline Phosphatase: 100 U/L (ref 38–126)
Bilirubin, Direct: 0.2 mg/dL (ref 0.1–0.5)
Indirect Bilirubin: 0.5 mg/dL (ref 0.3–0.9)
Total Bilirubin: 0.7 mg/dL (ref 0.3–1.2)
Total Protein: 4.9 g/dL — ABNORMAL LOW (ref 6.5–8.1)

## 2018-04-13 LAB — GRAM STAIN

## 2018-04-13 LAB — OSMOLALITY, URINE: Osmolality, Ur: 370 mOsm/kg (ref 300–900)

## 2018-04-13 LAB — SODIUM, URINE, RANDOM: SODIUM UR: 56 mmol/L

## 2018-04-13 LAB — PROTEIN, PLEURAL OR PERITONEAL FLUID: Total protein, fluid: 3.4 g/dL

## 2018-04-13 LAB — ALBUMIN, PLEURAL OR PERITONEAL FLUID: Albumin, Fluid: 2.3 g/dL

## 2018-04-13 LAB — LACTATE DEHYDROGENASE, PLEURAL OR PERITONEAL FLUID: LD, Fluid: 146 U/L — ABNORMAL HIGH (ref 3–23)

## 2018-04-13 MED ORDER — LIDOCAINE HCL (PF) 2 % IJ SOLN
INTRAMUSCULAR | Status: AC
  Filled 2018-04-13: qty 20

## 2018-04-13 MED ORDER — LIDOCAINE HCL (PF) 1 % IJ SOLN
INTRAMUSCULAR | Status: DC | PRN
  Administered 2018-04-13: 10 mL

## 2018-04-13 MED ORDER — METOPROLOL TARTRATE 12.5 MG HALF TABLET
12.5000 mg | ORAL_TABLET | Freq: Two times a day (BID) | ORAL | Status: DC
  Administered 2018-04-14 – 2018-04-15 (×2): 12.5 mg via ORAL
  Filled 2018-04-13 (×5): qty 1

## 2018-04-13 MED ORDER — LEVOTHYROXINE SODIUM 50 MCG PO TABS
50.0000 ug | ORAL_TABLET | Freq: Every day | ORAL | Status: DC
  Administered 2018-04-14 – 2018-04-16 (×3): 50 ug via ORAL
  Filled 2018-04-13 (×3): qty 1

## 2018-04-13 NOTE — Evaluation (Signed)
Physical Therapy Evaluation Patient Details Name: Kyle Armstrong MRN: [id number] DOB: 1930/01/14 Today's Date: 04/13/2018   History of Present Illness  Patient is an 84 year old male with history of diastolic congestive heart failure, chronic hyponatremia, moderate aortic stenosis and mild aortic regurgitation, sarcoidosis of the lung, hyperlipidemia, pernicious anemia, hypothyroidism, history of stomach cancer status post radiation therapy and chemotherapy, SVTs and arthritis.  The patient was admitted with worsening SOB, possibly acute on chronic diastolic congestive heart failure, significant left-sided pleural effusion.  Clinical Impression  Pt was indep PTA. Pt now with SOB with all activity and is requiring assist for all ADLs, transfers, and mobility. Pt is going for thoracentesis today. PT to re-assess mobility and d/c recs next visit.    Follow Up Recommendations SNF;Supervision/Assistance - 24 hour(pending progress s/p thoracentesis)    Equipment Recommendations  None recommended by PT    Recommendations for Other Services       Precautions / Restrictions Precautions Precautions: Fall Precaution Comments: SOB with mobility Restrictions Weight Bearing Restrictions: No      Mobility  Bed Mobility Overal bed mobility: Needs Assistance Bed Mobility: Supine to Sit     Supine to sit: Mod assist     General bed mobility comments: HOB elevated, modA for trunk elevation, pt able to move LEs off EOB  Transfers Overall transfer level: Needs assistance Equipment used: Rolling walker (2 wheeled) Transfers: Sit to/from Stand Sit to Stand: Min assist         General transfer comment: v/c's to push off of the bed not pull up on walker. minA to power up and maintain stability during transition of hands from bed to chair  Ambulation/Gait Ambulation/Gait assistance: Min guard Ambulation Distance (Feet): 20 Feet Assistive device: Rolling walker (2 wheeled) Gait  Pattern/deviations: Step-through pattern;Decreased stride length;Trunk flexed Gait velocity: slow   General Gait Details: pt with SOB, unable to get pulse ox reading. upon sitting, pulse read 2 min after at 91% on 2Lo2 via [place]. Amulation limited by SOB and transportation coming to take patient to [facility].   Stairs            Wheelchair Mobility    Modified Rankin (Stroke Patients Only)       Balance Overall balance assessment: Needs assistance Sitting-balance support: Feet supported;No upper extremity supported Sitting balance-Leahy Scale: Fair     Standing balance support: Bilateral upper extremity supported Standing balance-Leahy Scale: Poor Standing balance comment: needs RW                             Pertinent Vitals/Pain Pain Assessment: No/denies pain    Home Living Family/patient expects to be discharged to:: Private residence Living Arrangements: Alone Available Help at Discharge: Family;Available PRN/intermittently(son checks on him daily) Type of Home: House Home Access: Stairs to enter   [employer] of Steps: 1 step from [place]: One level Home Equipment: Environmental consultant - 2 wheels;Cane - single point;Grab bars - toilet;Grab bars - tub/shower;Shower seat Additional Comments: 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, now unable due to servere arthritis in the back/kyphotic posture and SOB with mobility/CHF    Prior Function Level of Independence: Independent with assistive device(s)         Comments: pt reports his son does the grocery shopping but mostly he eats out     Hand Dominance   Dominant Hand: Right    Extremity/Trunk Assessment  Upper Extremity Assessment Upper Extremity Assessment: Generalized weakness    Lower Extremity Assessment Lower Extremity Assessment: Generalized weakness    Cervical / Trunk Assessment Cervical / Trunk Assessment: Kyphotic  Communication   Communication: HOH   Cognition Arousal/Alertness: Awake/alert Behavior During Therapy: WFL for tasks assessed/performed Overall Cognitive Status: Within Functional Limits for tasks assessed                                        General Comments General comments (skin integrity, edema, etc.): pt with noted bilat LE edema.    Exercises     Assessment/Plan    PT Assessment Patient needs continued PT services  PT Problem List Decreased strength;Decreased range of motion;Decreased activity tolerance;Decreased balance;Decreased mobility       PT Treatment Interventions DME instruction;Gait training;Stair training;Functional mobility training;Therapeutic activities;Therapeutic exercise;Balance training    PT Goals (Current goals can be found in the Care Plan section)  Acute Rehab PT Goals Patient Stated Goal: improve breathing PT Goal Formulation: With patient Time For Goal Achievement: 04/27/18 Potential to Achieve Goals: Good    Frequency Min 3X/week   Barriers to discharge Decreased caregiver support lives alone    Co-evaluation               AM-PAC PT "6 Clicks" Daily Activity  Outcome Measure Difficulty turning over in bed (including adjusting bedclothes, sheets and blankets)?: Unable Difficulty moving from lying on back to sitting on the side of the bed? : Unable Difficulty sitting down on and standing up from a chair with arms (e.g., wheelchair, bedside commode, etc,.)?: Unable Help needed moving to and from a bed to chair (including a wheelchair)?: A Little Help needed walking in hospital room?: A Little Help needed climbing 3-5 steps with a railing? : A Lot 6 Click Score: 11    End of Session Equipment Utilized During Treatment: Gait belt;Oxygen Activity Tolerance: Patient limited by fatigue Patient left: in bed;with call bell/phone within reach;with bed alarm set Nurse Communication: Mobility status PT Visit Diagnosis: Unsteadiness on feet (R26.81)     Time: 1062-6948 PT Time Calculation (min) (ACUTE ONLY): 24 min   Charges:   PT Evaluation $PT Eval Moderate Complexity: 1 Mod PT Treatments $Gait Training: 8-22 mins   PT G Codes:        Kittie Plater, PT, DPT Pager #: [phone] Office #: [phone]   [place] 04/13/2018, 9:34 AM

## 2018-04-13 NOTE — Clinical Social Work Note (Signed)
Clinical Social Work Assessment  Patient Details  Name: Kyle Armstrong MRN: [id number] Date of Birth: [birth year]  Date of referral:  04/13/18               Reason for consult:  Facility Placement, Discharge Planning                Permission sought to share information with:    Permission granted to share information::  No  Name::        Agency::     Relationship::     Contact Information:     Housing/Transportation Living arrangements for the past 2 months:  Single Family Home Source of Information:  Patient, Medical Team Patient Interpreter Needed:  None Criminal Activity/Legal Involvement Pertinent to Current Situation/Hospitalization:  No - Comment as needed Significant Relationships:  Adult Children Lives with:  Self Do you feel safe going back to the place where you live?  Yes Need for family participation in patient care:  Yes (Comment)  Care giving concerns:  PT recommending SNF once medically stable for discharge.   Social Worker assessment / plan:  CSW met with patient. No supports at bedside. CSW introduced role and explained that PT recommendations would be discussed. Patient prefers to return home at discharge and is agreeable to home health. "I'll do whatever it takes to get home." Patient is not currently set up with a home health agency. RNCM notified. No further concerns. CSW signing of as social work intervention is no longer needed.  Employment status:  Retired Nurse, adult PT Recommendations:  [place] / Referral to community resources:  [place]  Patient/Family's Response to care:  Patient prefers HHPT. Patient's children supportive and involved in patient's care. Patient appreciated social work intervention.  Patient/Family's Understanding of and Emotional Response to Diagnosis, Current Treatment, and Prognosis:  Patient has a good understanding of the reason for admission and his need  for therapy after discharge. Patient appears happy with hospital care.  Emotional Assessment Appearance:  Appears stated age Attitude/Demeanor/Rapport:  Engaged, Gracious Affect (typically observed):  Appropriate, Calm, Pleasant Orientation:  Oriented to Self, Oriented to Place, Oriented to  Time, Oriented to Situation Alcohol / Substance use:  Never Used Psych involvement (Current and /or in the community):  No (Comment)  Discharge Needs  Concerns to be addressed:  Care Coordination Readmission within the last 30 days:  No Current discharge risk:  Dependent with Mobility, Lives alone Barriers to Discharge:  Continued Medical Work up   Candie Chroman, LCSW 04/13/2018, 3:25 PM

## 2018-04-13 NOTE — Procedures (Signed)
PROCEDURE SUMMARY:  Successful US guided left thoracentesis. Yielded 1.8 liters of bloody fluid. Patient tolerated procedure well. No immediate complications.  Specimen was sent for labs.  Post procedure chest X-ray reveals no pneumothorax  WENDY S BLAIR PA-C 04/13/2018 11:03 AM

## 2018-04-13 NOTE — Care Management Note (Signed)
Case Management Note  Patient Details  Name: Kyle Armstrong MRN: [id number] Date of Birth: 1930-05-03  Action/Plan: Received call that patient is refusing SNF placement and wants to return home; CM talked to patient at the bedside for [facility] choices, patient stated " I don't want anyone coming to my home." CM asked if I could talk to his son, he stated "why, I make all of my decisions." DME - walker, cane and oxygen at home; his son lives behind him and checks on him often. CM to follow up closer to discharge to see if he will allow me to make [facility] arrangements.   Expected Discharge Date:   possibly 04/16/2018               Expected Discharge Plan:  [place]  Discharge planning Services  CM Consult  Status of Service:  In process, will continue to follow  Sherrilyn Rist [phone] 04/13/2018, 3:39 PM

## 2018-04-13 NOTE — Progress Notes (Signed)
PROGRESS NOTE    KRISTINE TILEY  RKY:[id number] DOB: Jun 24, 1930 DOA: 04/10/2018 PCP: Lawerance Cruel, MD  Outpatient Specialists:     Brief Narrative: Patient is an 84 year old male with history of diastolic congestive heart failure, chronic hyponatremia, moderate aortic stenosis and mild aortic regurgitation, sarcoidosis of the lung, hyperlipidemia, pernicious anemia, hypothyroidism, history of stomach cancer status post radiation therapy and chemotherapy, SVTs and arthritis.  The patient was admitted with worsening SOB, possibly acute on chronic diastolic congestive heart failure, significant left-sided pleural effusion. Patient's BP has been on the low side, therefore, limiting aggressiveness of diuresis. Cardiology team has been consulted. ECHO revealed EF of 31-51%, Grade 1 diastolic dysfunction and mild aortic stenosis. Status post thoracentesis on 5/21, 1.8 L removed, blood pressure low after thoracentesis,. Titrate diuretic antihypertensive medications anticipate discharge in one to 2 days. SNF recommended  Assessment & Plan:   Active Problems:   Acute congestive heart failure (HCC)    #. Acute on chronic diastoli congestive heart failure:  -Titrate IV Lasix given blood pressure in the 76H to 60V systolic - Continue Lopressor with hold parameters - Intake/output, daily weight. - Echo  shows normal EF with grade 1 diastolic dysfunction - Cardiology consulted, the managing diuretics  - Low BP limits aggressiveness of diuresis.  #Large left-sided pleural effusion: -Status post thoracentesis. Effusion likely transudative  #. Hyponatremia, moderate - Chronic, suspect SIADH  Vs hypervolemic hyponatremia , fluid restriction, if sodium level does not increase with these measures consider Tolvaptan -Sodium is 119 today.  #. Anemia - Follow FOBT, B12, folate, ferritin and iron  #. History of hyperlipidemia - Continue Zocor  #. History of BPH - Continue Flomax  #.  History of hypothyroidism - Continue Synthroid, TSH 1.37  Admission status: Inpatient, tele Disposition-unable to discharge because of low sodium, eventually will need SNF Consults called: IR & cardiology  DVT Px: Lovenox, SCDs and early ambulation. Code Status: Full Code    Procedures:   For thoracentesis  Echo  Antimicrobials:   None    Subjective: No new complaints. No worsening of SOB. No chest pain, no fever or chills status postthoracentesis and blood pressure soft.  Objective: Vitals:   04/13/18 0802 04/13/18 0900 04/13/18 1156 04/13/18 1211  BP: 105/70 98/60 (!) 79/54 (!) 88/60  Pulse: 63  72   Resp: 20  20   Temp: 98.2 F (36.8 C)  98.3 F (36.8 C)   TempSrc: Oral  Oral   SpO2: 99%  99%   Weight:      Height:        Intake/Output Summary (Last 24 hours) at 04/13/2018 1314 Last data filed at 04/13/2018 1032 Gross per 24 hour  Intake 360 ml  Output 450 ml  Net -90 ml   Filed Weights   04/11/18 1612 04/12/18 0624 04/13/18 0500  Weight: 70.2 kg (154 lb 12.8 oz) 70 kg (154 lb 4.8 oz) 70.4 kg (155 lb 3.2 oz)    Examination:  General exam: Appears calm and comfortable. Cachectic Respiratory system: Some crackles at the left base Cardiovascular system: S1 & S2. Gastrointestinal system: Abdomen is nondistended, soft and nontender. No organomegaly or masses felt. Normal bowel sounds heard. Central nervous system: Alert and oriented. Patient moves all limbs Extremities: 1-1+ bilateral leg edema  Data Reviewed: I have personally reviewed following labs and imaging studies  CBC: Recent Labs  Lab 04/10/18 2255 04/11/18 0502  WBC 7.8 7.1  HGB 8.6* 9.1*  HCT 25.6* 26.8*  26.8*  MCV 94.5 91.8  PLT 243 160   Basic Metabolic Panel: Recent Labs  Lab 04/10/18 2255 04/11/18 0502 04/12/18 0608 04/13/18 0519  NA 120*  --  119* 119*  K 4.8  --  4.3 4.2  CL 84*  --  80* 79*  CO2 27  --  31 30  GLUCOSE 127*  --  97 93  BUN 18  --  15 15  CREATININE  0.97 0.98 0.90 0.86  CALCIUM 8.7*  --  8.7* 8.3*   GFR: Estimated Creatinine Clearance: 59.7 mL/min (by C-G formula based on SCr of 0.86 mg/dL). Liver Function Tests: Recent Labs  Lab 04/13/18 0519  AST 22  ALT 9*  ALKPHOS 100  BILITOT 0.7  PROT 4.9*  ALBUMIN 2.6*   No results for input(s): LIPASE, AMYLASE in the last 168 hours. No results for input(s): AMMONIA in the last 168 hours. Coagulation Profile: No results for input(s): INR, PROTIME in the last 168 hours. Cardiac Enzymes: Recent Labs  Lab 04/11/18 0502 04/11/18 1054 04/11/18 1627  TROPONINI <0.03 <0.03 <0.03   BNP (last 3 results) No results for input(s): PROBNP in the last 8760 hours. HbA1C: No results for input(s): HGBA1C in the last 72 hours. CBG: No results for input(s): GLUCAP in the last 168 hours. Lipid Profile: No results for input(s): CHOL, HDL, LDLCALC, TRIG, CHOLHDL, LDLDIRECT in the last 72 hours. Thyroid Function Tests: Recent Labs    04/11/18 0502  TSH 1.372   Anemia Panel: Recent Labs    04/11/18 0502  VITAMINB12 >7,500*  FERRITIN 661*  TIBC 235*  IRON 29*   Urine analysis:    Component Value Date/Time   COLORURINE YELLOW 02/09/2014 [place] 02/09/2014 1317   LABSPEC 1.012 02/09/2014 1317   PHURINE 6.5 02/09/2014 1317   GLUCOSEU NEGATIVE 02/09/2014 1317   HGBUR NEGATIVE 02/09/2014 1317   BILIRUBINUR NEGATIVE 02/09/2014 1317   KETONESUR NEGATIVE 02/09/2014 1317   PROTEINUR NEGATIVE 02/09/2014 1317   UROBILINOGEN 1.0 02/09/2014 1317   NITRITE NEGATIVE 02/09/2014 1317   LEUKOCYTESUR NEGATIVE 02/09/2014 1317   Sepsis Labs: @LABRCNTIP (procalcitonin:4,lacticidven:4)  )No results found for this or any previous visit (from the past 240 hour(s)).       Radiology Studies: Dg Chest 1 View  Result Date: 04/13/2018 CLINICAL DATA:  Thoracentesis on the left EXAM: CHEST  1 VIEW COMPARISON:  04/10/2018 FINDINGS: Significantly decreased left pleural effusion. No  pneumothorax or visible re-expansion edema. Similar appearance of bilateral interstitial opacity.  Cardiomegaly. IMPRESSION: No acute finding after left thoracentesis. Residual pleural fluid is small volume when accounting for volume loss at the left base. Electronically Signed   By: Monte Fantasia M.D.   On: 04/13/2018 10:17   Ir Thoracentesis Asp Pleural Space W/img Guide  Result Date: 04/13/2018 INDICATION: Congestive heart failure. Left pleural effusion. Request for diagnostic and therapeutic thoracentesis EXAM: ULTRASOUND GUIDED LEFT THORACENTESIS MEDICATIONS: 1% Lidocaine = 10 mL COMPLICATIONS: None immediate. PROCEDURE: An ultrasound guided thoracentesis was thoroughly discussed with the patient and questions answered. The benefits, risks, alternatives and complications were also discussed. The patient understands and wishes to proceed with the procedure. Written consent was obtained. Ultrasound was performed to localize and mark an adequate pocket of fluid in the left chest. The area was then prepped and draped in the normal sterile fashion. 1% Lidocaine was used for local anesthesia. Under ultrasound guidance a 6 Fr Safe-T-Centesis catheter was introduced. Thoracentesis was performed. The catheter was removed and a dressing applied.  FINDINGS: A total of approximately 1.8 liters of bloody fluid was removed. Samples were sent to the laboratory as requested by the clinical team. IMPRESSION: Successful ultrasound guided left thoracentesis yielding 1.8 liters of pleural fluid. No pneumothorax on post procedure chest X-ray. Read by: Gareth Eagle, PA-C Electronically Signed   By: Lucrezia Europe M.D.   On: 04/13/2018 10:54        Scheduled Meds: . aspirin EC  81 mg Oral Daily  . celecoxib  200 mg Oral BID  . enoxaparin (LOVENOX) injection  40 mg Subcutaneous Q24H  . ferrous sulfate  325 mg Oral TID WC  . furosemide  20 mg Intravenous Daily  . levothyroxine  50 mcg Oral QAC breakfast  . lidocaine        . metoprolol tartrate  12.5 mg Oral BID  . potassium chloride SA  20 mEq Oral Daily  . simvastatin  40 mg Oral QPM  . sodium chloride flush  3 mL Intravenous Q12H  . tamsulosin  0.4 mg Oral Daily  . cyanocobalamin  1,000 mcg Oral Daily   Continuous Infusions: . sodium chloride       LOS: 2 days    Time spent: 35 Minutes.

## 2018-04-13 NOTE — Progress Notes (Addendum)
Progress Note  Patient Name: Kyle Armstrong Date of Encounter: 04/13/2018  Primary Cardiologist: Larae Grooms, MD   Subjective   Patient reports improvement in his breathing after thoracentesis this morning. Denies chest pain or palpitations.   Inpatient Medications    Scheduled Meds: . aspirin EC  81 mg Oral Daily  . celecoxib  200 mg Oral BID  . enoxaparin (LOVENOX) injection  40 mg Subcutaneous Q24H  . ferrous sulfate  325 mg Oral TID WC  . furosemide  20 mg Intravenous Daily  . levothyroxine  50 mcg Oral QAC breakfast  . lidocaine      . metoprolol tartrate  12.5 mg Oral BID  . potassium chloride SA  20 mEq Oral Daily  . simvastatin  40 mg Oral QPM  . sodium chloride flush  3 mL Intravenous Q12H  . tamsulosin  0.4 mg Oral Daily  . cyanocobalamin  1,000 mcg Oral Daily   Continuous Infusions: . sodium chloride     PRN Meds: sodium chloride, acetaminophen, ipratropium-albuterol, lidocaine (PF), ondansetron (ZOFRAN) IV, sodium chloride flush   Vital Signs    Vitals:   04/13/18 0802 04/13/18 0900 04/13/18 1156 04/13/18 1211  BP: 105/70 98/60 (!) 79/54 (!) 88/60  Pulse: 63  72   Resp: 20  20   Temp: 98.2 F (36.8 C)  98.3 F (36.8 C)   TempSrc: Oral  Oral   SpO2: 99%  99%   Weight:      Height:        Intake/Output Summary (Last 24 hours) at 04/13/2018 1302 Last data filed at 04/13/2018 1032 Gross per 24 hour  Intake 360 ml  Output 450 ml  Net -90 ml   Filed Weights   04/11/18 1612 04/12/18 0624 04/13/18 0500  Weight: 154 lb 12.8 oz (70.2 kg) 154 lb 4.8 oz (70 kg) 155 lb 3.2 oz (70.4 kg)    Telemetry    Sinus rhythm with 1st degree AV block and occasional PACs - Personally Reviewed   Physical Exam   GEN: thin elderly gentleman laying in bed in no acute distress.   Neck: No JVD, no carotid bruits Cardiac: RRR, +murmur, no rubs or gallops.  Respiratory: mildly decreased breath sounds a L lung base; mild crackles at lung bases GI: NABS,  Soft, nontender, non-distended  MS: 2-3+ LLE/ 2+ RLE edema; No deformity. Neuro:  Nonfocal, moving all extremities spontaneously Psych: Normal affect   Labs    Chemistry Recent Labs  Lab 04/10/18 2255 04/11/18 0502 04/12/18 0608 04/13/18 0519  NA 120*  --  119* 119*  K 4.8  --  4.3 4.2  CL 84*  --  80* 79*  CO2 27  --  31 30  GLUCOSE 127*  --  97 93  BUN 18  --  15 15  CREATININE 0.97 0.98 0.90 0.86  CALCIUM 8.7*  --  8.7* 8.3*  PROT  --   --   --  4.9*  ALBUMIN  --   --   --  2.6*  AST  --   --   --  22  ALT  --   --   --  9*  ALKPHOS  --   --   --  100  BILITOT  --   --   --  0.7  GFRNONAA >60 >60 >60 >60  GFRAA >60 >60 >60 >60  ANIONGAP 9  --  8 10     Hematology Recent Labs  Lab 04/10/18  2255 04/11/18 0502  WBC 7.8 7.1  RBC 2.71* 2.92*  HGB 8.6* 9.1*  HCT 25.6* 26.8*  26.8*  MCV 94.5 91.8  MCH 31.7 31.2  MCHC 33.6 34.0  RDW 12.6 12.5  PLT 243 262    Cardiac Enzymes Recent Labs  Lab 04/11/18 0502 04/11/18 1054 04/11/18 1627  TROPONINI <0.03 <0.03 <0.03    Recent Labs  Lab 04/10/18 2309  TROPIPOC 0.00     BNP Recent Labs  Lab 04/10/18 2255  BNP 264.2*     DDimer No results for input(s): DDIMER in the last 168 hours.   Radiology    Dg Chest 1 View  Result Date: 04/13/2018 CLINICAL DATA:  Thoracentesis on the left EXAM: CHEST  1 VIEW COMPARISON:  04/10/2018 FINDINGS: Significantly decreased left pleural effusion. No pneumothorax or visible re-expansion edema. Similar appearance of bilateral interstitial opacity.  Cardiomegaly. IMPRESSION: No acute finding after left thoracentesis. Residual pleural fluid is small volume when accounting for volume loss at the left base. Electronically Signed   By: Monte Fantasia M.D.   On: 04/13/2018 10:17   Ir Thoracentesis Asp Pleural Space W/img Guide  Result Date: 04/13/2018 INDICATION: Congestive heart failure. Left pleural effusion. Request for diagnostic and therapeutic thoracentesis EXAM:  ULTRASOUND GUIDED LEFT THORACENTESIS MEDICATIONS: 1% Lidocaine = 10 mL COMPLICATIONS: None immediate. PROCEDURE: An ultrasound guided thoracentesis was thoroughly discussed with the patient and questions answered. The benefits, risks, alternatives and complications were also discussed. The patient understands and wishes to proceed with the procedure. Written consent was obtained. Ultrasound was performed to localize and mark an adequate pocket of fluid in the left chest. The area was then prepped and draped in the normal sterile fashion. 1% Lidocaine was used for local anesthesia. Under ultrasound guidance a 6 Fr Safe-T-Centesis catheter was introduced. Thoracentesis was performed. The catheter was removed and a dressing applied. FINDINGS: A total of approximately 1.8 liters of bloody fluid was removed. Samples were sent to the laboratory as requested by the clinical team. IMPRESSION: Successful ultrasound guided left thoracentesis yielding 1.8 liters of pleural fluid. No pneumothorax on post procedure chest X-ray. Read by: Gareth Eagle, PA-C Electronically Signed   By: Lucrezia Europe M.D.   On: 04/13/2018 10:54    Cardiac Studies   Echocardiogram 04/12/18: Study Conclusions  - Left ventricle: The cavity size was normal. There was moderate   concentric hypertrophy. Systolic function was normal. The   estimated ejection fraction was in the range of 55% to 60%.   Images were inadequate for LV wall motion assessment. There was   an increased relative contribution of atrial contraction to   ventricular filling. Doppler parameters are consistent with   abnormal left ventricular relaxation (grade 1 diastolic   dysfunction). - Aortic valve: Poorly visualized. Trileaflet; severely thickened,   severely calcified leaflets. There was mild stenosis. Peak   velocity (S): 242 cm/s. Mean gradient (S): 14 mm Hg. Valve area   (VTI): 0.89 cm^2. Valve area (Vmax): 0.96 cm^2. Valve area   (Vmean): 0.87 cm^2. - Mitral  valve: Poorly visualized. Calcified annulus. - Left atrium: The atrium was moderately dilated. - Tricuspid valve: Poorly visualized. - Pulmonic valve: Poorly visualized. - Pericardium, extracardiac: There was a left pleural effusion.   Patient Profile     84 y.o. male with PMH of chronic diastolic CHF, SVT/AVNRT s/p ablation, HTN, HLD, anemia, remote gastric CA, and hard of hearing, who presents with complaints of SOB. Cardiology following for acute on chronic diastolic CHF.  Assessment & Plan    1. Acute on chronic diastolic CHF: p/w SOB. BNP 264 (843 on last admission for CHF exacerbation 06/2017). CXR with large left pleural effusion. He underwent thoracentesis today with 1.8L removed. He was started on gentle diuresis with IV lasix 20mg  daily. UOP with -178mL in the last 24 hours; -2.7L this admission. Weight stable 154>155lbs. Cr stable. Echo yesterday with EF 55-60%, G1DD, and moderate AS.  - Continue IV lasix 20mg  daily - limited in titration by hypotension. - Continue to fluid restrict - 1.5L daily - Continue to monitor strict I&Os and daily weights  2. Hyponatremia: suspect SIADH. Na remains at 119 today despite fluid restriction and gentle diuresis. Considering Tolvaptan if no improvement.  - Continue management per primary team  3. Large Left Pleural Effusion: likely contributing to SOB. Patient underwent diagnostic/therapeutic thoracentesis today with -1.8L this admission.  - Continue management per primary team  4. Aortic Stenosis: remains moderate on echo this admission. Reportedly not a candidate for aortic valve replacement per discussions on prior admissions.  - Continue to monitor routinely  5. Hypoalbuminemia: albumin 2.6. Suspect malnutrition. Likely contributing to LE edema.  - Consider nutrition consult and/or protein shakes  For questions or updates, please contact [place] Please consult [email or web address] for contact info under Cardiology/STEMI.        Signed, Abigail Butts, PA-C  04/13/2018, 1:02 PM   [phone]   History and all data above reviewed.  Patient examined.  I agree with the findings as above.   Status post thoracentesis. He thinks that his breathing is better.   Weak.  Na still 119.  The patient exam reveals COR:RRR  ,  Lungs: Improved breath sounds  ,  Abd: No, Ext No edema.  All available labs, radiology testing, previous records reviewed. Agree with documented assessment and plan.  At this point I would still suggestTolvaptan.  Follow Na in the AM.   Minus Breeding  3:18 PM  04/13/2018

## 2018-04-13 NOTE — Progress Notes (Signed)
Patient B/P is 79/54 automatic, and 88/60 manual, MD Abrol  Is aware.

## 2018-04-14 LAB — BASIC METABOLIC PANEL
Anion gap: 7 (ref 5–15)
BUN: 15 mg/dL (ref 6–20)
CALCIUM: 8.4 mg/dL — AB (ref 8.9–10.3)
CO2: 32 mmol/L (ref 22–32)
CREATININE: 0.84 mg/dL (ref 0.61–1.24)
Chloride: 81 mmol/L — ABNORMAL LOW (ref 101–111)
GFR calc non Af Amer: >60 mL/min (ref >60)
Glucose, Bld: 96 mg/dL (ref 65–99)
Potassium: 4.4 mmol/L (ref 3.5–5.1)
SODIUM: 120 mmol/L — AB (ref 135–145)

## 2018-04-14 LAB — CORTISOL: Cortisol, Plasma: 8.7 ug/dL

## 2018-04-14 NOTE — Evaluation (Signed)
Occupational Therapy Evaluation Patient Details Name: Kyle Armstrong MRN: [id number] DOB: 08-22-1930 Today's Date: 04/14/2018    History of Present Illness Patient is an 84 year old male with history of diastolic congestive heart failure, chronic hyponatremia, moderate aortic stenosis and mild aortic regurgitation, sarcoidosis of the lung, hyperlipidemia, pernicious anemia, hypothyroidism, history of stomach cancer status post radiation therapy and chemotherapy, SVTs and arthritis.  The patient was admitted with worsening SOB, possibly acute on chronic diastolic congestive heart failure, significant left-sided pleural effusion.   Clinical Impression   Pt with decline in function and safety with ADLs and ADL mobility with decreased strength, balance , endurance and safety awareness. Pt refusing short tern SNF and HH therapies. Pt would benefit from acute OT services to address impairments to maximize level of function and safety    Follow Up Recommendations  SNF;Supervision/Assistance - 24 hour;Home health OT    Equipment Recommendations  3 in 1 bedside commode    Recommendations for Other Services       Precautions / Restrictions Precautions Precautions: Fall Precaution Comments: watch SpO2 Restrictions Weight Bearing Restrictions: No      Mobility Bed Mobility Overal bed mobility: Needs Assistance Bed Mobility: Supine to Sit     Supine to sit: Mod assist     General bed mobility comments: pt up in chair upon PT arrival  Transfers Overall transfer level: Needs assistance Equipment used: Rolling walker (2 wheeled) Transfers: Sit to/from Stand Sit to Stand: Min guard         General transfer comment: v/c's to push up from arms of chair, increased time    Balance Overall balance assessment: Needs assistance Sitting-balance support: Feet supported Sitting balance-Leahy Scale: Fair     Standing balance support: Bilateral upper extremity supported;During functional  activity Standing balance-Leahy Scale: Poor Standing balance comment: needs RW                           ADL either performed or assessed with clinical judgement   ADL Overall ADL's : Needs assistance/impaired Eating/Feeding: Set up;Sitting   Grooming: Wash/dry hands;Wash/dry face;Standing;Min guard   Upper Body Bathing: Supervision/ safety;Set up;Sitting   Lower Body Bathing: Minimal assistance   Upper Body Dressing : Supervision/safety;Set up;Sitting   Lower Body Dressing: Minimal assistance   Toilet Transfer: Ambulation;Min guard;BSC;RW   Toileting- Clothing Manipulation and Hygiene: Minimal assistance;Sit to/from stand       Functional mobility during ADLs: Min guard;Rolling walker       Vision Patient Visual Report: No change from baseline       Perception     Praxis      Pertinent Vitals/Pain Pain Assessment: No/denies pain     Hand Dominance Right   Extremity/Trunk Assessment Upper Extremity Assessment Upper Extremity Assessment: Generalized weakness   Lower Extremity Assessment Lower Extremity Assessment: Defer to PT evaluation   Cervical / Trunk Assessment Cervical / Trunk Assessment: Kyphotic   Communication Communication Communication: HOH   Cognition Arousal/Alertness: Awake/alert Behavior During Therapy: WFL for tasks assessed/performed Overall Cognitive Status: Within Functional Limits for tasks assessed                                 General Comments: pt does require v/c's for safety   General Comments       Exercises     Shoulder Instructions      Home Living Family/patient expects  to be discharged to:: Private residence Living Arrangements: Alone Available Help at Discharge: Family;Available PRN/intermittently Type of Home: House Home Access: Stairs to enter [employer] of Steps: 1 step from [place]: One level     Bathroom Shower/Tub: Radiographer, therapeutic: Standard         Additional Comments: 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, now unable due to servere arthritis in the back/kyphotic posture and SOB with mobility/CHF      Prior Functioning/Environment Level of Independence: Independent with assistive device(s)        Comments: pt reports his son does the grocery shopping but mostly he eats out        OT Problem List: Decreased strength;Decreased activity tolerance;Impaired balance (sitting and/or standing);Decreased coordination;Decreased safety awareness      OT Treatment/Interventions: Self-care/ADL training;DME and/or AE instruction;Balance training;Therapeutic activities;Therapeutic exercise;Neuromuscular education;Patient/family education    OT Goals(Current goals can be found in the care plan section) Acute Rehab OT Goals Patient Stated Goal: home now OT Goal Formulation: With patient Time For Goal Achievement: 04/28/18 Potential to Achieve Goals: Good ADL Goals Pt Will Perform Grooming: with set-up;with supervision;standing Pt Will Perform Lower Body Bathing: with min guard assist;with supervision Pt Will Perform Lower Body Dressing: with min guard assist;with supervision Pt Will Transfer to Toilet: with supervision;ambulating Pt Will Perform Toileting - Clothing Manipulation and hygiene: with min guard assist;with supervision;sit to/from stand Pt Will Perform Tub/Shower Transfer: with min guard assist;with supervision;ambulating;rolling walker;shower seat;3 in 1  OT Frequency: Min 2X/week   Barriers to D/C: Decreased caregiver support  pt refusing SNF and HH       Co-evaluation              AM-PAC PT "6 Clicks" Daily Activity     Outcome Measure Help from another person eating meals?: None Help from another person taking care of personal grooming?: A Little Help from another person toileting, which includes using toliet, bedpan, or urinal?: A Lot Help from another person bathing  (including washing, rinsing, drying)?: A Lot Help from another person to put on and taking off regular upper body clothing?: A Little Help from another person to put on and taking off regular lower body clothing?: A Lot 6 Click Score: 16   End of Session Equipment Utilized During Treatment: Gait belt;Rolling walker  Activity Tolerance: Patient tolerated treatment well Patient left: in chair;with call bell/phone within reach;with chair alarm set;with nursing/sitter in room  OT Visit Diagnosis: Unsteadiness on feet (R26.81);Muscle weakness (generalized) (M62.81);History of falling (Z91.81);Other abnormalities of gait and mobility (R26.89)                Time: 6945-0388 OT Time Calculation (min): 26 min Charges:  OT General Charges $OT Visit: 1 Visit OT Evaluation $OT Eval Moderate Complexity: 1 Mod OT Treatments $Therapeutic Activity: 8-22 mins G-Codes: OT G-codes **NOT FOR INPATIENT CLASS** Functional Assessment Tool Used: AM-PAC 6 Clicks Daily Activity     Britt Bottom 04/14/2018, 2:23 PM

## 2018-04-14 NOTE — Progress Notes (Addendum)
Progress Note  Patient Name: Kyle Armstrong Date of Encounter: 04/14/2018  Primary Cardiologist: Larae Grooms, MD   Subjective   Reports feeling better. Asking to go home. Notes improvement in his breathing and LE swelling.   Inpatient Medications    Scheduled Meds: . aspirin EC  81 mg Oral Daily  . celecoxib  200 mg Oral BID  . enoxaparin (LOVENOX) injection  40 mg Subcutaneous Q24H  . ferrous sulfate  325 mg Oral TID WC  . furosemide  20 mg Intravenous Daily  . levothyroxine  50 mcg Oral QAC breakfast  . metoprolol tartrate  12.5 mg Oral BID  . potassium chloride SA  20 mEq Oral Daily  . simvastatin  40 mg Oral QPM  . sodium chloride flush  3 mL Intravenous Q12H  . tamsulosin  0.4 mg Oral Daily  . cyanocobalamin  1,000 mcg Oral Daily   Continuous Infusions: . sodium chloride     PRN Meds: sodium chloride, acetaminophen, ipratropium-albuterol, lidocaine (PF), ondansetron (ZOFRAN) IV, sodium chloride flush   Vital Signs    Vitals:   04/13/18 1900 04/13/18 1919 04/14/18 0329 04/14/18 0932  BP:  97/66 105/74 (!) 88/48  Pulse:  72 65 72  Resp:  18 18   Temp:  98.3 F (36.8 C) 97.7 F (36.5 C)   TempSrc: Oral Oral Oral   SpO2:  100% 100%   Weight:   149 lb 3.2 oz (67.7 kg)   Height:        Intake/Output Summary (Last 24 hours) at 04/14/2018 1120 Last data filed at 04/14/2018 0825 Gross per 24 hour  Intake 360 ml  Output 1425 ml  Net -1065 ml   Filed Weights   04/12/18 0624 04/13/18 0500 04/14/18 0329  Weight: 154 lb 4.8 oz (70 kg) 155 lb 3.2 oz (70.4 kg) 149 lb 3.2 oz (67.7 kg)    Telemetry    Sinus rhythm with 1st degree AV block, occasional PACs - Personally Reviewed   Physical Exam   GEN: Thin elderly gentleman sitting in bedside chair in no acute distress.   Neck: No JVD, no carotid bruits Cardiac: RRR, no murmurs, rubs, or gallops.  Respiratory: Clear to auscultation bilaterally with crackles at lung bases GI: NABS, Soft, nontender,  non-distended  MS: 1+ RLE edema; No deformity. Neuro:  Nonfocal, moving all extremities spontaneously Psych: Normal affect   Labs    Chemistry Recent Labs  Lab 04/12/18 0608 04/13/18 0519 04/14/18 0439  NA 119* 119* 120*  K 4.3 4.2 4.4  CL 80* 79* 81*  CO2 31 30 32  GLUCOSE 97 93 96  BUN 15 15 15   CREATININE 0.90 0.86 0.84  CALCIUM 8.7* 8.3* 8.4*  PROT  --  4.9*  --   ALBUMIN  --  2.6*  --   AST  --  22  --   ALT  --  9*  --   ALKPHOS  --  100  --   BILITOT  --  0.7  --   GFRNONAA >60 >60 >60  GFRAA >60 >60 >60  ANIONGAP 8 10 7      Hematology Recent Labs  Lab 04/10/18 2255 04/11/18 0502  WBC 7.8 7.1  RBC 2.71* 2.92*  HGB 8.6* 9.1*  HCT 25.6* 26.8*  26.8*  MCV 94.5 91.8  MCH 31.7 31.2  MCHC 33.6 34.0  RDW 12.6 12.5  PLT 243 262    Cardiac Enzymes Recent Labs  Lab 04/11/18 0502 04/11/18 1054 04/11/18 1627  TROPONINI <0.03 <0.03 <0.03    Recent Labs  Lab 04/10/18 2309  TROPIPOC 0.00     BNP Recent Labs  Lab 04/10/18 2255 04/13/18 1350  BNP 264.2* 148.2*     DDimer No results for input(s): DDIMER in the last 168 hours.   Radiology    Dg Chest 1 View  Result Date: 04/13/2018 CLINICAL DATA:  Thoracentesis on the left EXAM: CHEST  1 VIEW COMPARISON:  04/10/2018 FINDINGS: Significantly decreased left pleural effusion. No pneumothorax or visible re-expansion edema. Similar appearance of bilateral interstitial opacity.  Cardiomegaly. IMPRESSION: No acute finding after left thoracentesis. Residual pleural fluid is small volume when accounting for volume loss at the left base. Electronically Signed   By: Monte Fantasia M.D.   On: 04/13/2018 10:17   Ir Thoracentesis Asp Pleural Space W/img Guide  Result Date: 04/13/2018 INDICATION: Congestive heart failure. Left pleural effusion. Request for diagnostic and therapeutic thoracentesis EXAM: ULTRASOUND GUIDED LEFT THORACENTESIS MEDICATIONS: 1% Lidocaine = 10 mL COMPLICATIONS: None immediate. PROCEDURE:  An ultrasound guided thoracentesis was thoroughly discussed with the patient and questions answered. The benefits, risks, alternatives and complications were also discussed. The patient understands and wishes to proceed with the procedure. Written consent was obtained. Ultrasound was performed to localize and mark an adequate pocket of fluid in the left chest. The area was then prepped and draped in the normal sterile fashion. 1% Lidocaine was used for local anesthesia. Under ultrasound guidance a 6 Fr Safe-T-Centesis catheter was introduced. Thoracentesis was performed. The catheter was removed and a dressing applied. FINDINGS: A total of approximately 1.8 liters of bloody fluid was removed. Samples were sent to the laboratory as requested by the clinical team. IMPRESSION: Successful ultrasound guided left thoracentesis yielding 1.8 liters of pleural fluid. No pneumothorax on post procedure chest X-ray. Read by: Gareth Eagle, PA-C Electronically Signed   By: Lucrezia Europe M.D.   On: 04/13/2018 10:54    Cardiac Studies   Echocardiogram 04/12/18: Study Conclusions  - Left ventricle: The cavity size was normal. There was moderate concentric hypertrophy. Systolic function was normal. The estimated ejection fraction was in the range of 55% to 60%. Images were inadequate for LV wall motion assessment. There was an increased relative contribution of atrial contraction to ventricular filling. Doppler parameters are consistent with abnormal left ventricular relaxation (grade 1 diastolic dysfunction). - Aortic valve: Poorly visualized. Trileaflet; severely thickened, severely calcified leaflets. There was mild stenosis. Peak velocity (S): 242 cm/s. Mean gradient (S): 14 mm Hg. Valve area (VTI): 0.89 cm^2. Valve area (Vmax): 0.96 cm^2. Valve area (Vmean): 0.87 cm^2. - Mitral valve: Poorly visualized. Calcified annulus. - Left atrium: The atrium was moderately dilated. - Tricuspid  valve: Poorly visualized. - Pulmonic valve: Poorly visualized. - Pericardium, extracardiac: There was a left pleural effusion.    Patient Profile   84 y.o. male with PMH of chronic diastolic CHF, SVT/AVNRT s/p ablation, HTN, HLD, anemia, remote gastric CA, and hard of hearing, who presents with complaints of SOB. Cardiology following for acute on chronic diastolic CHF.   Assessment & Plan    1. Acute on chronic diastolic CHF: p/w SOB. BNP 264 (843 on last admission for CHF exacerbation 06/2017). CXR with large left pleural effusion. He underwent thoracentesis 04/13/18 with 1.8L removed. He was started on gentle diuresis with IV lasix 20mg  daily, limited by hypotension. UOP with net -860mL in the last 24 hours; -4.0L this admission. Weight 155>149lbs today. Cr stable. Echo 5/20 with EF 55-60%, G1DD, and moderate  AS. LE edema improved from yesterday. Still with mild crackles at lung bases on exam today.  - AM lasix held given hypotension; can consider dosing later in the day if BP improved - Continue to fluid restrict - 1.5L daily - Continue to monitor strict I&Os and daily weights  2. Hyponatremia: suspect SIADH. Na marginally improved to 120 today despite fluid restriction and gentle diuresis.   - Consider trial of Tolvaptan   3. Large Left Pleural Effusion: likely contributing to SOB. Patient underwent diagnostic/therapeutic thoracentesis 5/21 with -1.8L this admission. CXR post procedure with small residual effusion.  - Continue management per primary team  4. Aortic Stenosis: remains moderate on echo this admission. Reportedly not a candidate for aortic valve replacement per discussions on prior admissions.  - Continue to monitor routinely  5. Hypoalbuminemia: albumin 2.6. Suspect malnutrition. Likely contributing to LE edema.     For questions or updates, please contact [place] Please consult [email or web address] for contact info under Cardiology/STEMI.      Signed, Abigail Butts, PA-C  04/14/2018, 11:20 AM   [phone]  History and all data above reviewed.  Patient examined.  The patient was ambulating in the hallway and we were not able to record the O2 sats.  He was much better per PT than yesterday since his thoracentesis.  He was wearing O2. .  All available labs, radiology testing, previous records reviewed. Agree with documented assessment and plan. Na is still low.  He is not markedly volume overloaded and will not tolerate significant diuresis.  The question of Tolvaptan keeps coming up.  There is no doubt that this med will improve Na over the short term.  Clinically, in the short term it helps with patients who have confusion related to hyponatremia.  This patient is not confused or somnolent.  Data from [facility] however, do not show an improvement in mortality or rehospitalization.  Having a low Na is a bad prognostic indicator.  However, correcting it with an Tolvaptan is not proven to change that prognosis.   I think the issue here is quality of life (which for this patient clearly includes going home).  There should be discussion of goals of care and palliative care could be involved.  I suspect he wants to be comfortable and out of the hospital.  He is much more comfortable since he had thoracentesis.  He needs continued strict fluid restriction.  He needs adequate nutrition.       Jeneen Rinks Ly Wass  12:56 PM  04/14/2018

## 2018-04-14 NOTE — Progress Notes (Signed)
Paged cardiology regarding pt low blood pressure  Awaiting call back  Pt non symptomatic at this time  Will continue to monitor

## 2018-04-14 NOTE — Consult Note (Signed)
Reason for Consult: Hyponatremia Referring Physician:  Dr. Denton Brick  Chief Complaint: Shortness of break  Assessment/Plan: 1. Hyponatremia - Patient has a chronic component of hyponatremia but last labs that we have @ [facility] are a SNa of 129 06/2017. It would be interesting to see if there are other metabolic panels since Aug 2018 to see if it has been a slowly decreasing or if the current SNa of 120 is purely an acute phenomena. Urine osmolality is greater than SOsmolality suggesting impaired urinary dilution which may be from normal suppression of ADH (diuretic induced hyponatremia) or unsuppressed ADH levels such as from CHF vs SIADH (malignancy, pulmonary disease. Patient does have sarcoidosis and I wonder if the chronic component of his hyponatremia is secondary to CHF + pulmonary disease (h/o sarcoidosis and now a left sided pleural effusion @ time of presentation). - check a cortisol level; TSH is normal and pt also on Synthroid. - Continue diuresis for now; would hold off on salt tablets given the CHF + presentation of dyspnea. - Continue fluid restriction. - I don't see e/o JVD, skin tenting, CXR is clear of vascular congestion, only edema is in the ankles, BP on soft side -> hesitant to use Tolvaptan in this case. 2. CHF - continue diuresis for now 3. Left sided pleural effusion s/p thoracentesis 04/13/18 with 1.8L removed 4. Sarcoidosis 5. Hypothyroidism on Synthroid 6. BPH    HPI: Kyle Armstrong is an 84 y.o. male with a history of CHF grade 1 diastolic dysfunction w/ mild aortic stenosis, chronic hyponatremia, moderate aortic stenosis, sarcoidosis of the lung, hypothyroidism and stomach cancer s/p surgery/XRT/chemo p/w dyspnea and found to have a left sided effusion s/p thoracentesis on 5/21 (1.8L removed). He was also started on IV diuretics with a weight that decreased from 70.2kg to 67.7 kg and cumulative net neg of -4310 during this hospitalization. He denies any dizziness, thirst,  nausea or severe pain. Sodium in 06/2017 was 128 and during this hospitalization the SNa has been in the 120 range.  He is not on any SSRI's or thiazides but is on Lovenox during this hospitalization. He does have sarcoidosis and was noted to have a left sided pleural effusion s/p thoracentesis this hospitalization.  ROS Pertinent items are noted in HPI.  Chemistry and CBC: Creatinine  Date/Time Value Ref Range Status  12/26/2013 11:25 AM 0.9 0.7 - 1.3 mg/dL Final  11/16/2013 10:22 AM 0.9 0.7 - 1.3 mg/dL Final   Creat  Date/Time Value Ref Range Status  03/09/2014 08:02 AM 0.80 0.50 - 1.35 mg/dL Final   Creatinine, Ser  Date/Time Value Ref Range Status  04/14/2018 04:39 AM 0.84 0.61 - 1.24 mg/dL Final  04/13/2018 05:19 AM 0.86 0.61 - 1.24 mg/dL Final  04/12/2018 06:08 AM 0.90 0.61 - 1.24 mg/dL Final  04/11/2018 05:02 AM 0.98 0.61 - 1.24 mg/dL Final  04/10/2018 10:55 PM 0.97 0.61 - 1.24 mg/dL Final  06/24/2017 04:23 AM 1.14 0.61 - 1.24 mg/dL Final  06/23/2017 04:30 AM 1.13 0.61 - 1.24 mg/dL Final  06/22/2017 03:05 AM 0.90 0.61 - 1.24 mg/dL Final  06/21/2017 08:12 PM 0.92 0.61 - 1.24 mg/dL Final  06/21/2017 11:31 AM 0.94 0.61 - 1.24 mg/dL Final  06/21/2017 09:10 AM 0.86 0.61 - 1.24 mg/dL Final  06/21/2017 01:21 AM 0.80 0.61 - 1.24 mg/dL Final  02/24/2014 04:23 AM 0.64 0.50 - 1.35 mg/dL Final  02/22/2014 04:15 AM 0.76 0.50 - 1.35 mg/dL Final  02/20/2014 04:32 AM 0.77 0.50 - 1.35 mg/dL Final  02/19/2014 04:46 AM 0.70 0.50 - 1.35 mg/dL Final  02/18/2014 05:17 AM 0.71 0.50 - 1.35 mg/dL Final  02/17/2014 04:30 AM 0.69 0.50 - 1.35 mg/dL Final  02/16/2014 05:08 AM 0.77 0.50 - 1.35 mg/dL Final  02/15/2014 02:31 PM 0.71 0.50 - 1.35 mg/dL Final  02/09/2014 01:20 PM 0.84 0.50 - 1.35 mg/dL Final  12/25/2013 12:00 PM 0.89 0.50 - 1.35 mg/dL Final  07/09/2012 10:00 AM 1.0 0.4 - 1.5 mg/dL Final  06/20/2012 07:02 PM 1.10 0.50 - 1.35 mg/dL Final  06/09/2012 01:33 PM 0.92 0.50 - 1.35 mg/dL Final   04/30/2010 05:30 AM 1.04 0.4 - 1.5 mg/dL Final  04/29/2010 04:15 AM 1.12 0.4 - 1.5 mg/dL Final  04/28/2010 07:54 AM 0.9 0.4 - 1.5 mg/dL Final  04/28/2010 07:28 AM 1.05 0.4 - 1.5 mg/dL Final   Recent Labs  Lab 04/10/18 2255 04/11/18 0502 04/12/18 0608 04/13/18 0519 04/14/18 0439  NA 120*  --  119* 119* 120*  K 4.8  --  4.3 4.2 4.4  CL 84*  --  80* 79* 81*  CO2 27  --  31 30 32  GLUCOSE 127*  --  97 93 96  BUN 18  --  '15 15 15  '$ CREATININE 0.97 0.98 0.90 0.86 0.84  CALCIUM 8.7*  --  8.7* 8.3* 8.4*   Recent Labs  Lab 04/10/18 2255 04/11/18 0502  WBC 7.8 7.1  HGB 8.6* 9.1*  HCT 25.6* 26.8*  26.8*  MCV 94.5 91.8  PLT 243 262   Liver Function Tests: Recent Labs  Lab 04/13/18 0519  AST 22  ALT 9*  ALKPHOS 100  BILITOT 0.7  PROT 4.9*  ALBUMIN 2.6*   No results for input(s): LIPASE, AMYLASE in the last 168 hours. No results for input(s): AMMONIA in the last 168 hours. Cardiac Enzymes: Recent Labs  Lab 04/11/18 0502 04/11/18 1054 04/11/18 1627  TROPONINI <0.03 <0.03 <0.03   Iron Studies: No results for input(s): IRON, TIBC, TRANSFERRIN, FERRITIN in the last 72 hours. PT/INR: '@LABRCNTIP'$ (inr:5)  Xrays/Other Studies: ) Results for orders placed or performed during the hospital encounter of 04/10/18 (from the past 48 hour(s))  Basic metabolic panel     Status: Abnormal   Collection Time: 04/13/18  5:19 AM  Result Value Ref Range   Sodium 119 (LL) 135 - 145 mmol/L    Comment: CRITICAL RESULT CALLED TO, READ BACK BY AND VERIFIED WITH: E.DODOO,RN [id number] 04/13/18 CLARK,S CORRECTED ON 05/21 AT 1357: PREVIOUSLY REPORTED AS 119 CRITICAL RESULT CALLED TO, READ BACK BY AND VERIFIED WITH: E.DODOO,RN [id number] 04/13/17 CLARK,S    Potassium 4.2 3.5 - 5.1 mmol/L   Chloride 79 (L) 101 - 111 mmol/L   CO2 30 22 - 32 mmol/L   Glucose, Bld 93 65 - 99 mg/dL   BUN 15 6 - 20 mg/dL   Creatinine, Ser 0.86 0.61 - 1.24 mg/dL   Calcium 8.3 (L) 8.9 - 10.3 mg/dL   GFR calc non Af Amer >60  >60 mL/min   GFR calc Af Amer >60 >60 mL/min    Comment: (NOTE) The eGFR has been calculated using the CKD EPI equation. This calculation has not been validated in all clinical situations. eGFR's persistently <60 mL/min signify possible Chronic Kidney Disease.    Anion gap 10 5 - 15    Comment: Performed at [facility] [address], [place], [place] [address]  Hepatic function panel     Status: Abnormal   Collection Time: 04/13/18  5:19 AM  Result Value Ref Range   Total Protein 4.9 (L) 6.5 - 8.1 g/dL   Albumin 2.6 (L) 3.5 - 5.0 g/dL   AST 22 15 - 41 U/L   ALT 9 (L) 17 - 63 U/L   Alkaline Phosphatase 100 38 - 126 U/L   Total Bilirubin 0.7 0.3 - 1.2 mg/dL   Bilirubin, Direct 0.2 0.1 - 0.5 mg/dL   Indirect Bilirubin 0.5 0.3 - 0.9 mg/dL    Comment: Performed at [facility] [address] [place], [place] [address]  Lactate dehydrogenase (pleural or peritoneal fluid)     Status: Abnormal   Collection Time: 04/13/18  9:56 AM  Result Value Ref Range   LD, Fluid 146 (H) 3 - 23 U/L    Comment: (NOTE) Results should be evaluated in conjunction with serum values    Fluid Type-FLDH Pleural, L     Comment: Performed at [facility] [address], [place], [place] [address]  Body fluid cell count with differential     Status: Abnormal   Collection Time: 04/13/18  9:56 AM  Result Value Ref Range   Fluid Type-FCT Pleural, L    Color, Fluid RED (A) YELLOW   Appearance, Fluid TURBID (A) CLEAR   WBC, Fluid 384 0 - 1,000 cu mm   Neutrophil Count, Fluid 7 0 - 25 %   Lymphs, Fluid 84 %   Monocyte-Macrophage-Serous Fluid 3 (L) 50 - 90 %   Eos, Fluid 6 %    Comment: Performed at [facility], [place] [address], [place], [place] [address]  Albumin, pleural or peritoneal fluid     Status: None   Collection Time: 04/13/18  9:56 AM  Result Value Ref Range   Albumin, Fluid 2.3 g/dL    Comment: (NOTE) No normal range established for this test Results should be  evaluated in conjunction with serum values    Fluid Type-FALB Pleural, L     Comment: Performed at [facility] [address], [place], [place] [address]  Protein, pleural or peritoneal fluid     Status: None   Collection Time: 04/13/18  9:56 AM  Result Value Ref Range   Total protein, fluid 3.4 g/dL    Comment: (NOTE) No normal range established for this test Results should be evaluated in conjunction with serum values    Fluid Type-FTP Pleural, L     Comment: Performed at [facility] [address], [place], [place] [address]  Glucose, pleural or peritoneal fluid     Status: None   Collection Time: 04/13/18  9:56 AM  Result Value Ref Range   Glucose, Fluid 87 mg/dL    Comment: (NOTE) No normal range established for this test Results should be evaluated in conjunction with serum values    Fluid Type-FGLU Pleural, L     Comment: Performed at [facility] [address], [place], [place] [address]  Culture, body fluid-bottle     Status: None (Preliminary result)   Collection Time: 04/13/18  9:56 AM  Result Value Ref Range   Specimen Description PLEURAL    Special Requests LEFT    Culture      NO GROWTH 1 DAY Performed at [facility], [place] [address] [place], [place] [address]    Report Status PENDING   Gram stain     Status: None   Collection Time: 04/13/18  9:56 AM  Result Value Ref Range  Specimen Description PLEURAL LEFT    Special Requests FLUID    Gram Stain      FEW WBC PRESENT,BOTH PMN AND MONONUCLEAR NO ORGANISMS SEEN Performed at [facility], [address] [place], [place] [address]    Report Status 04/13/2018 FINAL   Brain natriuretic peptide     Status: Abnormal   Collection Time: 04/13/18  1:50 PM  Result Value Ref Range   B Natriuretic Peptide 148.2 (H) 0.0 - 100.0 pg/mL    Comment: Performed at [facility] [address], [place], [place] [address]  Sodium, urine, random     Status: None   Collection Time:  04/13/18  7:59 PM  Result Value Ref Range   Sodium, Ur 56 mmol/L    Comment: Performed at [facility] [address] [place], [place] [address]  Osmolality, urine     Status: None   Collection Time: 04/13/18  8:00 PM  Result Value Ref Range   Osmolality, Ur 370 300 - 900 mOsm/kg    Comment: Performed at [facility] [address] [place], [place] [address]  Basic metabolic panel     Status: Abnormal   Collection Time: 04/14/18  4:39 AM  Result Value Ref Range   Sodium 120 (L) 135 - 145 mmol/L   Potassium 4.4 3.5 - 5.1 mmol/L   Chloride 81 (L) 101 - 111 mmol/L   CO2 32 22 - 32 mmol/L   Glucose, Bld 96 65 - 99 mg/dL   BUN 15 6 - 20 mg/dL   Creatinine, Ser 0.84 0.61 - 1.24 mg/dL   Calcium 8.4 (L) 8.9 - 10.3 mg/dL   GFR calc non Af Amer >60 >60 mL/min   GFR calc Af Amer >60 >60 mL/min    Comment: (NOTE) The eGFR has been calculated using the CKD EPI equation. This calculation has not been validated in all clinical situations. eGFR's persistently <60 mL/min signify possible Chronic Kidney Disease.    Anion gap 7 5 - 15    Comment: Performed at [facility] [address] [place], [place] [address]   Dg Chest 1 View  Result Date: 04/13/2018 CLINICAL DATA:  Thoracentesis on the left EXAM: CHEST  1 VIEW COMPARISON:  04/10/2018 FINDINGS: Significantly decreased left pleural effusion. No pneumothorax or visible re-expansion edema. Similar appearance of bilateral interstitial opacity.  Cardiomegaly. IMPRESSION: No acute finding after left thoracentesis. Residual pleural fluid is small volume when accounting for volume loss at the left base. Electronically Signed   By: Monte Fantasia M.D.   On: 04/13/2018 10:17   Ir Thoracentesis Asp Pleural Space W/img Guide  Result Date: 04/13/2018 INDICATION: Congestive heart failure. Left pleural effusion. Request for diagnostic and therapeutic thoracentesis EXAM: ULTRASOUND GUIDED LEFT THORACENTESIS MEDICATIONS: 1% Lidocaine =  10 mL COMPLICATIONS: None immediate. PROCEDURE: An ultrasound guided thoracentesis was thoroughly discussed with the patient and questions answered. The benefits, risks, alternatives and complications were also discussed. The patient understands and wishes to proceed with the procedure. Written consent was obtained. Ultrasound was performed to localize and mark an adequate pocket of fluid in the left chest. The area was then prepped and draped in the normal sterile fashion. 1% Lidocaine was used for local anesthesia. Under ultrasound guidance a 6 Fr Safe-T-Centesis catheter was introduced. Thoracentesis was performed. The catheter was removed and a dressing applied. FINDINGS: A total of approximately 1.8 liters of bloody fluid was removed. Samples were sent to the laboratory  as requested by the clinical team. IMPRESSION: Successful ultrasound guided left thoracentesis yielding 1.8 liters of pleural fluid. No pneumothorax on post procedure chest X-ray. Read by: Gareth Eagle, PA-C Electronically Signed   By: Lucrezia Europe M.D.   On: 04/13/2018 10:54    PMH:   Past Medical History:  Diagnosis Date  . Allergy   . Arthritis   . Eczema   . Gastric cancer ([facility]) 10/24/2013  . Hx of radiation therapy 11/10/13-12/21/13   gastric ca, total 50.4Gy  . Hyperlipidemia   . Hypothyroidism   . Lazy eye of right side   . Pernicious anemia   . 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 EP study and ablation  07/16/12   slow pathway ablation for AVNRT by DR Allred  . HERNIA REPAIR  1980's   inguinal done x 2  . IR THORACENTESIS ASP PLEURAL SPACE W/IMG GUIDE  04/13/2018  . LAPAROSCOPIC CHOLECYSTECTOMY W/ CHOLANGIOGRAPHY  2011  . LAPAROSCOPIC PARTIAL GASTRECTOMY N/A 02/15/2014   Procedure: LAPAROSCOPIC diagnositc, distal gastrostomy  feeding jejunostomy;  Surgeon: Stark Klein, MD;  Location: [facility];  Service: General;   Laterality: N/A;  . LAPAROSCOPIC [place] Bilateral 2007   bil recurrent inguinal hernias  . SUPRAVENTRICULAR TACHYCARDIA ABLATION Bilateral 07/16/2012   Procedure: SUPRAVENTRICULAR TACHYCARDIA ABLATION;  Surgeon: Thompson Grayer, MD;  Location: [facility] CATH LAB;  Service: Cardiovascular;  Laterality: Bilateral;    Allergies:  Allergies  Allergen Reactions  . Lipitor [Atorvastatin] Other (See Comments)    Leg cramps  . Amoxicillin Rash    rash  . Augmentin [Amoxicillin-Pot Clavulanate] Rash    Medications:   Prior to Admission medications   Medication Sig Start Date End Date Taking? Authorizing Provider  aspirin EC 81 MG EC tablet Take 1 tablet (81 mg total) by mouth daily. 06/25/17  Yes Rai, Ripudeep K, MD  celecoxib (CELEBREX) 200 MG capsule Take 200 mg by mouth 2 (two) times daily. 04/29/17  Yes [provider]  cyanocobalamin 1000 MCG tablet Take 1,000 mcg by mouth daily.   Yes [provider]  ferrous sulfate 325 (65 FE) MG EC tablet Take 325 mg by mouth 3 (three) times daily with meals. 06/16/14  Yes Ladell Pier, MD  furosemide (LASIX) 20 MG tablet Take 20 mg by mouth daily.   Yes [provider]  levothyroxine (SYNTHROID, LEVOTHROID) 50 MCG tablet Take 50 mcg by mouth daily before breakfast.    Yes [provider]  metoprolol tartrate (LOPRESSOR) 25 MG tablet Take 0.5 tablets (12.5 mg total) by mouth 2 (two) times daily. 06/24/17  Yes Rai, Ripudeep K, MD  potassium chloride SA (K-DUR,KLOR-CON) 20 MEQ tablet Take 1 tablet (20 mEq total) by mouth daily. 06/26/17  Yes Rai, Ripudeep K, MD  simvastatin (ZOCOR) 40 MG tablet Take 40 mg by mouth every evening.   Yes [provider]  tamsulosin (FLOMAX) 0.4 MG CAPS capsule Take 0.4 mg by mouth daily. 03/25/17  Yes [provider]  albuterol (PROVENTIL HFA;VENTOLIN HFA) 108 (90 Base) MCG/ACT inhaler Inhale 2 puffs into the lungs every 6 (six) hours as needed for  wheezing or shortness of breath. Patient not taking: Reported on 07/01/2017 06/24/17   Rai, Vernelle Emerald, MD  furosemide (LASIX) 40 MG tablet Take 1 tablet (40 mg total) by mouth daily. Patient not taking: Reported on 04/11/2018 06/24/17  Rai, Vernelle Emerald, MD    Discontinued Meds:   Medications Discontinued During This Encounter  Medication Reason  . ranitidine (ZANTAC) 150 MG capsule No longer needed (for PRN medications)  . pantoprazole (PROTONIX) 40 MG tablet No longer needed (for PRN medications)  . Cyanocobalamin (VITAMIN B-12 IJ) Discontinued by provider  . acetaminophen (TYLENOL) 500 MG tablet No longer needed (for PRN medications)  . dextromethorphan-guaiFENesin (MUCINEX DM) 30-600 MG 12hr tablet Completed Course  . predniSONE (DELTASONE) 10 MG tablet Completed Course  . traMADol (ULTRAM) 50 MG tablet Completed Course  . metoprolol tartrate (LOPRESSOR) tablet 12.5 mg   . levothyroxine (SYNTHROID, LEVOTHROID) tablet 50 mcg     Social History:  reports that he quit smoking about 27 years ago. His smoking use included cigarettes. He has a 30.00 pack-year smoking history. He has never used smokeless tobacco. He reports that he does not drink alcohol or use drugs.  Family History:   Family History  Problem Relation Age of Onset  . Heart attack Mother   . Heart attack Father   . Hypertension Unknown     Blood pressure 107/65, pulse 77, temperature 98.1 F (36.7 C), temperature source Oral, resp. rate 18, height '5\' 8"'$  (1.727 m), weight 67.7 kg (149 lb 3.2 oz), SpO2 96 %. General appearance: alert, cooperative and appears stated age Head: Normocephalic, without obvious abnormality, atraumatic Eyes: negative Neck: no adenopathy, no carotid bruit, no JVD, supple, symmetrical, trachea midline and thyroid not enlarged, symmetric, no tenderness/mass/nodules Back: symmetric, no curvature. ROM normal. No CVA tenderness. Resp: rales bibasilar, bilaterally and right > left Chest wall: no  tenderness Cardio: regular rate and rhythm GI: soft, non-tender; bowel sounds normal; no masses,  no organomegaly Extremities: edema ankle edema present Pulses: 2+ and symmetric Skin: Skin color, texture, turgor normal. No rashes or lesions Lymph nodes: Cervical, supraclavicular, and axillary nodes normal. Neurologic: Grossly normal       Tonisha Silvey, Hunt Oris, MD 04/14/2018, 8:12 PM

## 2018-04-14 NOTE — Progress Notes (Signed)
Cardiology PA returned call, stated okay to hold IV lasix until MD rounds  Pt aware

## 2018-04-14 NOTE — Progress Notes (Signed)
PROGRESS NOTE    Kyle Armstrong  DZH:[id number] DOB: 1930/02/20 DOA: 04/10/2018 PCP: Lawerance Cruel, MD  Outpatient Specialists:     Brief Narrative: Patient is an 84 year old male with history of diastolic congestive heart failure, chronic hyponatremia, moderate aortic stenosis and mild aortic regurgitation, sarcoidosis of the lung, hyperlipidemia, pernicious anemia, hypothyroidism, history of stomach cancer status post radiation therapy and chemotherapy, SVTs and arthritis.  The patient was admitted with worsening SOB, possibly acute on chronic diastolic congestive heart failure, significant left-sided pleural effusion. Patient's BP has been on the low side, therefore, limiting aggressiveness of diuresis. Cardiology team has been consulted. ECHO revealed EF of 41-96%, Grade 1 diastolic dysfunction and mild aortic stenosis. Status post thoracentesis on 5/21, 1.8 L removed, blood pressure low after thoracentesis,. on diuretic antihypertensive medications, SNF or 24/7 supervision with [facility] services  recommended   Plan:- 1)HFpEF-/ Acute on chronic diastoli congestive heart failure: -Titrate up IV Lasix if blood pressure allows, currently on 20 mg IV daily , continue metoprolol 12.5 mg twice daily with hold parameters, fluid balance remains negative,, last known EF over 55%.  Cardiology consult appreciated  2)Large left-sided pleural effusion: =- thoracentesis 04/13/18 with 1.8 L removed, . Effusion likely transudative  3)Hyponatremia, moderate- - Chronic, suspect SIADH  Vs hypervolemic hyponatremia , hyponatremia persist despite fluid restriction, d/w Dr Augustin Coupe from nephrology service,??? Tolvaptan, sodium remains in the 120 range (baseline sodium appears to be around 130)  4)Anemia- - Follow FOBT, B12, folate, ferritin and iron  5) History of hyperlipidemia- - Continue Zocor  6)History of BPH-stable,  Continue Flomax  7)History of hypothyroidism-stable, TSH 1.37, continue  levothyroxine  8)Disposition-PT/OT recommends SNF or 24/7 supervision with [facility] services  Recommended, however patient adamantly refuses to go to SNF or to have home health services at home   Admission status: Inpatient, tele Disposition-unable to discharge because of low sodium, eventually will need SNF Consults called: IR & cardiology  DVT Px: Lovenox, SCDs and early ambulation. Code Status: Full Code    Procedures:    thoracentesis 04/13/18 with 1.8 L removed  Echo  Antimicrobials:   None    Subjective: Patient eager to go home, still refusing to go to skilled nursing facility, no chest pains no palpitations no dizziness, soft blood pressures noted  Objective: Vitals:   04/13/18 1919 04/14/18 0329 04/14/18 0932 04/14/18 1147  BP: 97/66 105/74 (!) 88/48 105/76  Pulse: 72 65 72 65  Resp: 18 18    Temp: 98.3 F (36.8 C) 97.7 F (36.5 C)  97.9 F (36.6 C)  TempSrc: Oral Oral  Oral  SpO2: 100% 100%  96%  Weight:  67.7 kg (149 lb 3.2 oz)    Height:        Intake/Output Summary (Last 24 hours) at 04/14/2018 1734 Last data filed at 04/14/2018 1634 Gross per 24 hour  Intake 360 ml  Output 1425 ml  Net -1065 ml   Filed Weights   04/12/18 0624 04/13/18 0500 04/14/18 0329  Weight: 70 kg (154 lb 4.8 oz) 70.4 kg (155 lb 3.2 oz) 67.7 kg (149 lb 3.2 oz)    Examination:  General exam: Appears calm and comfortable. Cachectic Respiratory system: Fine bibasilar rales Cardiovascular system: S1 & S2. Gastrointestinal system: Abdomen is nondistended, soft and nontender.Normal bowel sounds heard. Central nervous system: Alert and oriented. Patient moves all limbs Extremities: 1-1+ bilateral leg edema  Data Reviewed: I have personally reviewed following labs and imaging studies  CBC: Recent Labs  Lab 04/10/18 2255 04/11/18 0502  WBC 7.8 7.1  HGB 8.6* 9.1*  HCT 25.6* 26.8*  26.8*  MCV 94.5 91.8  PLT 243 130   Basic Metabolic Panel: Recent Labs  Lab 04/10/18 2255  04/11/18 0502 04/12/18 0608 04/13/18 0519 04/14/18 0439  NA 120*  --  119* 119* 120*  K 4.8  --  4.3 4.2 4.4  CL 84*  --  80* 79* 81*  CO2 27  --  31 30 32  GLUCOSE 127*  --  97 93 96  BUN 18  --  15 15 15   CREATININE 0.97 0.98 0.90 0.86 0.84  CALCIUM 8.7*  --  8.7* 8.3* 8.4*   GFR: Estimated Creatinine Clearance: 60.4 mL/min (by C-G formula based on SCr of 0.84 mg/dL). Liver Function Tests: Recent Labs  Lab 04/13/18 0519  AST 22  ALT 9*  ALKPHOS 100  BILITOT 0.7  PROT 4.9*  ALBUMIN 2.6*   No results for input(s): LIPASE, AMYLASE in the last 168 hours. No results for input(s): AMMONIA in the last 168 hours. Coagulation Profile: No results for input(s): INR, PROTIME in the last 168 hours. Cardiac Enzymes: Recent Labs  Lab 04/11/18 0502 04/11/18 1054 04/11/18 1627  TROPONINI <0.03 <0.03 <0.03   BNP (last 3 results) No results for input(s): PROBNP in the last 8760 hours. HbA1C: No results for input(s): HGBA1C in the last 72 hours. CBG: No results for input(s): GLUCAP in the last 168 hours. Lipid Profile: No results for input(s): CHOL, HDL, LDLCALC, TRIG, CHOLHDL, LDLDIRECT in the last 72 hours. Thyroid Function Tests: No results for input(s): TSH, T4TOTAL, FREET4, T3FREE, THYROIDAB in the last 72 hours. Anemia Panel: No results for input(s): VITAMINB12, FOLATE, FERRITIN, TIBC, IRON, RETICCTPCT in the last 72 hours. Urine analysis:    Component Value Date/Time   COLORURINE YELLOW 02/09/2014 [place] 02/09/2014 1317   LABSPEC 1.012 02/09/2014 1317   PHURINE 6.5 02/09/2014 1317   GLUCOSEU NEGATIVE 02/09/2014 1317   HGBUR NEGATIVE 02/09/2014 1317   BILIRUBINUR NEGATIVE 02/09/2014 1317   KETONESUR NEGATIVE 02/09/2014 1317   PROTEINUR NEGATIVE 02/09/2014 1317   UROBILINOGEN 1.0 02/09/2014 1317   NITRITE NEGATIVE 02/09/2014 1317   LEUKOCYTESUR NEGATIVE 02/09/2014 1317   Sepsis Labs: @LABRCNTIP (procalcitonin:4,lacticidven:4)  ) Recent  Results (from the past 240 hour(s))  Culture, body fluid-bottle     Status: None (Preliminary result)   Collection Time: 04/13/18  9:56 AM  Result Value Ref Range Status   Specimen Description PLEURAL  Final   Special Requests LEFT  Final   Culture   Final    NO GROWTH 1 DAY Performed at [facility], [place] [address], [place], [place] [address]    Report Status PENDING  Incomplete  Gram stain     Status: None   Collection Time: 04/13/18  9:56 AM  Result Value Ref Range Status   Specimen Description PLEURAL LEFT  Final   Special Requests FLUID  Final   Gram Stain   Final    FEW WBC PRESENT,BOTH PMN AND MONONUCLEAR NO ORGANISMS SEEN Performed at [facility], [address], [place], [place] [address]    Report Status 04/13/2018 FINAL  Final         Radiology Studies: Dg Chest 1 View  Result Date: 04/13/2018 CLINICAL DATA:  Thoracentesis on the left EXAM: CHEST  1 VIEW COMPARISON:  04/10/2018 FINDINGS: Significantly decreased left pleural effusion. No pneumothorax or visible re-expansion edema. Similar appearance of bilateral  interstitial opacity.  Cardiomegaly. IMPRESSION: No acute finding after left thoracentesis. Residual pleural fluid is small volume when accounting for volume loss at the left base. Electronically Signed   By: Monte Fantasia M.D.   On: 04/13/2018 10:17   Ir Thoracentesis Asp Pleural Space W/img Guide  Result Date: 04/13/2018 INDICATION: Congestive heart failure. Left pleural effusion. Request for diagnostic and therapeutic thoracentesis EXAM: ULTRASOUND GUIDED LEFT THORACENTESIS MEDICATIONS: 1% Lidocaine = 10 mL COMPLICATIONS: None immediate. PROCEDURE: An ultrasound guided thoracentesis was thoroughly discussed with the patient and questions answered. The benefits, risks, alternatives and complications were also discussed. The patient understands and wishes to proceed with the procedure. Written consent was obtained. Ultrasound was performed to  localize and mark an adequate pocket of fluid in the left chest. The area was then prepped and draped in the normal sterile fashion. 1% Lidocaine was used for local anesthesia. Under ultrasound guidance a 6 Fr Safe-T-Centesis catheter was introduced. Thoracentesis was performed. The catheter was removed and a dressing applied. FINDINGS: A total of approximately 1.8 liters of bloody fluid was removed. Samples were sent to the laboratory as requested by the clinical team. IMPRESSION: Successful ultrasound guided left thoracentesis yielding 1.8 liters of pleural fluid. No pneumothorax on post procedure chest X-ray. Read by: Gareth Eagle, PA-C Electronically Signed   By: Lucrezia Europe M.D.   On: 04/13/2018 10:54        Scheduled Meds: . aspirin EC  81 mg Oral Daily  . celecoxib  200 mg Oral BID  . enoxaparin (LOVENOX) injection  40 mg Subcutaneous Q24H  . ferrous sulfate  325 mg Oral TID WC  . furosemide  20 mg Intravenous Daily  . levothyroxine  50 mcg Oral QAC breakfast  . metoprolol tartrate  12.5 mg Oral BID  . potassium chloride SA  20 mEq Oral Daily  . simvastatin  40 mg Oral QPM  . sodium chloride flush  3 mL Intravenous Q12H  . tamsulosin  0.4 mg Oral Daily  . cyanocobalamin  1,000 mcg Oral Daily   Continuous Infusions: . sodium chloride       LOS: 3 days    Time spent: 35 Minutes.

## 2018-04-14 NOTE — Progress Notes (Signed)
Physical Therapy Treatment Patient Details Name: Kyle Armstrong MRN: [id number] DOB: 03/11/1930 Today's Date: 04/14/2018    History of Present Illness Patient is an 84 year old male with history of diastolic congestive heart failure, chronic hyponatremia, moderate aortic stenosis and mild aortic regurgitation, sarcoidosis of the lung, hyperlipidemia, pernicious anemia, hypothyroidism, history of stomach cancer status post radiation therapy and chemotherapy, SVTs and arthritis.  The patient was admitted with worsening SOB, possibly acute on chronic diastolic congestive heart failure, significant left-sided pleural effusion.    PT Comments    Pt much better today and able to ambulate 150' with RW now that he had 1.8L removed from his thoracentesis yesterday. Pt remains at increased falls risk and would benefit from 24/7 assist and HHPT however pt adamantly refusing stating "i've lived by myself for a long time, I'm fine." Discussed the benefits of therapy to improved balance, endurance, and strength however pt con't to decline. Acute PT to con't to follow.    Follow Up Recommendations  Supervision/Assistance - 24 hour;Home health PT(aware pt is refusing HH)     Equipment Recommendations  None recommended by PT    Recommendations for Other Services       Precautions / Restrictions Precautions Precautions: Fall Precaution Comments: watch SpO2 Restrictions Weight Bearing Restrictions: No    Mobility  Bed Mobility               General bed mobility comments: pt up in chair upon PT arrival  Transfers Overall transfer level: Needs assistance Equipment used: Rolling walker (2 wheeled) Transfers: Sit to/from Stand Sit to Stand: Min guard         General transfer comment: v/c's to push up from arms of chair, increased time  Ambulation/Gait Ambulation/Gait assistance: Min guard Ambulation Distance (Feet): 150 Feet Assistive device: Rolling walker (2 wheeled) Gait  Pattern/deviations: Step-through pattern;Decreased stride length;Trunk flexed Gait velocity: slow   General Gait Details: pt did not require standing rest breaks. Pt with mild SOB, SpO2 at 78% on 3LO2 via [place]   Stairs             Wheelchair Mobility    Modified Rankin (Stroke Patients Only)       Balance Overall balance assessment: Needs assistance Sitting-balance support: Feet supported Sitting balance-Leahy Scale: Fair     Standing balance support: Bilateral upper extremity supported Standing balance-Leahy Scale: Poor Standing balance comment: needs RW                            Cognition Arousal/Alertness: Awake/alert Behavior During Therapy: WFL for tasks assessed/performed Overall Cognitive Status: Within Functional Limits for tasks assessed                                 General Comments: pt does require v/c's for safety      Exercises      General Comments        Pertinent Vitals/Pain Pain Assessment: No/denies pain    Home Living                      Prior Function            PT Goals (current goals can now be found in the care plan section) Acute Rehab PT Goals Patient Stated Goal: home now Progress towards PT goals: Progressing toward goals    Frequency  Min 3X/week      PT Plan Current plan remains appropriate    Co-evaluation              AM-PAC PT "6 Clicks" Daily Activity  Outcome Measure  Difficulty turning over in bed (including adjusting bedclothes, sheets and blankets)?: Unable Difficulty moving from lying on back to sitting on the side of the bed? : Unable Difficulty sitting down on and standing up from a chair with arms (e.g., wheelchair, bedside commode, etc,.)?: A Little Help needed moving to and from a bed to chair (including a wheelchair)?: A Little Help needed walking in hospital room?: A Little Help needed climbing 3-5 steps with a railing? : A Lot 6 Click Score:  13    End of Session Equipment Utilized During Treatment: Gait belt;Oxygen Activity Tolerance: Patient limited by fatigue Patient left: with call bell/phone within reach;in chair;with chair alarm set Nurse Communication: Mobility status PT Visit Diagnosis: Unsteadiness on feet (R26.81)     Time: 6720-9470 PT Time Calculation (min) (ACUTE ONLY): 24 min  Charges:  $Gait Training: 23-37 mins                    G Codes:       Kittie Plater, PT, DPT Pager #: [phone] Office #: [phone]    [place] 04/14/2018, 1:55 PM

## 2018-04-14 NOTE — Care Management Important Message (Signed)
Important Message  Patient Details  Name: Kyle Armstrong MRN: [id number] Date of Birth: 1930/02/07   Medicare Important Message Given:  Yes    [name] P [place] 04/14/2018, 3:21 PM

## 2018-04-15 LAB — CBC
HCT: 26.5 % — ABNORMAL LOW (ref 39.0–52.0)
Hemoglobin: 8.8 g/dL — ABNORMAL LOW (ref 13.0–17.0)
MCH: 31.2 pg (ref 26.0–34.0)
MCHC: 33.2 g/dL (ref 30.0–36.0)
MCV: 94 fL (ref 78.0–100.0)
PLATELETS: 259 10*3/uL (ref 150–400)
RBC: 2.82 MIL/uL — ABNORMAL LOW (ref 4.22–5.81)
RDW: 12.7 % (ref 11.5–15.5)
WBC: 7 10*3/uL (ref 4.0–10.5)

## 2018-04-15 LAB — BASIC METABOLIC PANEL
ANION GAP: 8 (ref 5–15)
BUN: 18 mg/dL (ref 6–20)
CALCIUM: 8.4 mg/dL — AB (ref 8.9–10.3)
CHLORIDE: 86 mmol/L — AB (ref 101–111)
CO2: 31 mmol/L (ref 22–32)
CREATININE: 0.99 mg/dL (ref 0.61–1.24)
GFR calc non Af Amer: >60 mL/min (ref >60)
GLUCOSE: 103 mg/dL — AB (ref 65–99)
POTASSIUM: 4.1 mmol/L (ref 3.5–5.1)
Sodium: 125 mmol/L — ABNORMAL LOW (ref 135–145)

## 2018-04-15 MED ORDER — FUROSEMIDE 10 MG/ML IJ SOLN
20.0000 mg | Freq: Once | INTRAMUSCULAR | Status: AC
  Administered 2018-04-15: 20 mg via INTRAVENOUS
  Filled 2018-04-15: qty 2

## 2018-04-15 MED ORDER — FUROSEMIDE 10 MG/ML IJ SOLN
40.0000 mg | Freq: Two times a day (BID) | INTRAMUSCULAR | Status: DC
  Filled 2018-04-15 (×3): qty 4

## 2018-04-15 NOTE — Progress Notes (Addendum)
Progress Note  Patient Name: Kyle Armstrong Date of Encounter: 04/15/2018  Primary Cardiologist: Larae Grooms, MD   Subjective   Patient reports feeling fine this morning. Denies any chest pain, SOB, or palpitations. States his legs are half the size as they were when he was admitted. Adamant that he is going home today.   Inpatient Medications    Scheduled Meds: . aspirin EC  81 mg Oral Daily  . enoxaparin (LOVENOX) injection  40 mg Subcutaneous Q24H  . ferrous sulfate  325 mg Oral TID WC  . furosemide  40 mg Intravenous Q12H  . levothyroxine  50 mcg Oral QAC breakfast  . metoprolol tartrate  12.5 mg Oral BID  . potassium chloride SA  20 mEq Oral Daily  . simvastatin  40 mg Oral QPM  . sodium chloride flush  3 mL Intravenous Q12H  . tamsulosin  0.4 mg Oral Daily  . cyanocobalamin  1,000 mcg Oral Daily   Continuous Infusions: . sodium chloride     PRN Meds: sodium chloride, acetaminophen, ipratropium-albuterol, lidocaine (PF), ondansetron (ZOFRAN) IV, sodium chloride flush   Vital Signs    Vitals:   04/14/18 1958 04/15/18 0318 04/15/18 0734 04/15/18 0852  BP: 107/65 106/72 (!) 94/57 95/63  Pulse: 77 72 70   Resp:  18 20   Temp: 98.1 F (36.7 C) 97.9 F (36.6 C) 97.6 F (36.4 C)   TempSrc: Oral Oral Oral   SpO2:  100% 100%   Weight:  146 lb 3.2 oz (66.3 kg)    Height:        Intake/Output Summary (Last 24 hours) at 04/15/2018 1117 Last data filed at 04/15/2018 1004 Gross per 24 hour  Intake 240 ml  Output 800 ml  Net -560 ml   Filed Weights   04/13/18 0500 04/14/18 0329 04/15/18 0318  Weight: 155 lb 3.2 oz (70.4 kg) 149 lb 3.2 oz (67.7 kg) 146 lb 3.2 oz (66.3 kg)    Telemetry    NSR with occasional PACs; 1st degree AV block - Personally Reviewed  Physical Exam   GEN: Laying in bed in no acute distress.   Neck: No JVD, no carotid bruits Cardiac: RRR, +murmur, no rubs or gallops.  Respiratory: Clear to auscultation bilaterally, no wheezes/  rales/ rhonchi GI: NABS, Soft, nontender, non-distended  MS: 1+ L ankle/ lower tibia edema; No deformity. Neuro:  Nonfocal, moving all extremities spontaneously; HOH Psych: Normal affect   Labs    Chemistry Recent Labs  Lab 04/13/18 0519 04/14/18 0439 04/15/18 0410  NA 119* 120* 125*  K 4.2 4.4 4.1  CL 79* 81* 86*  CO2 30 32 31  GLUCOSE 93 96 103*  BUN 15 15 18   CREATININE 0.86 0.84 0.99  CALCIUM 8.3* 8.4* 8.4*  PROT 4.9*  --   --   ALBUMIN 2.6*  --   --   AST 22  --   --   ALT 9*  --   --   ALKPHOS 100  --   --   BILITOT 0.7  --   --   GFRNONAA >60 >60 >60  GFRAA >60 >60 >60  ANIONGAP 10 7 8      Hematology Recent Labs  Lab 04/10/18 2255 04/11/18 0502 04/15/18 0410  WBC 7.8 7.1 7.0  RBC 2.71* 2.92* 2.82*  HGB 8.6* 9.1* 8.8*  HCT 25.6* 26.8*  26.8* 26.5*  MCV 94.5 91.8 94.0  MCH 31.7 31.2 31.2  MCHC 33.6 34.0 33.2  RDW 12.6  12.5 12.7  PLT 243 262 259    Cardiac Enzymes Recent Labs  Lab 04/11/18 0502 04/11/18 1054 04/11/18 1627  TROPONINI <0.03 <0.03 <0.03    Recent Labs  Lab 04/10/18 2309  TROPIPOC 0.00     BNP Recent Labs  Lab 04/10/18 2255 04/13/18 1350  BNP 264.2* 148.2*     DDimer No results for input(s): DDIMER in the last 168 hours.   Radiology    No results found.  Cardiac Studies   Echocardiogram 04/12/18: Study Conclusions  - Left ventricle: The cavity size was normal. There was moderate concentric hypertrophy. Systolic function was normal. The estimated ejection fraction was in the range of 55% to 60%. Images were inadequate for LV wall motion assessment. There was an increased relative contribution of atrial contraction to ventricular filling. Doppler parameters are consistent with abnormal left ventricular relaxation (grade 1 diastolic dysfunction). - Aortic valve: Poorly visualized. Trileaflet; severely thickened, severely calcified leaflets. There was mild stenosis. Peak velocity (S): 242  cm/s. Mean gradient (S): 14 mm Hg. Valve area (VTI): 0.89 cm^2. Valve area (Vmax): 0.96 cm^2. Valve area (Vmean): 0.87 cm^2. - Mitral valve: Poorly visualized. Calcified annulus. - Left atrium: The atrium was moderately dilated. - Tricuspid valve: Poorly visualized. - Pulmonic valve: Poorly visualized. - Pericardium, extracardiac: There was a left pleural effusion.    Patient Profile     84 y.o.malewith PMH of chronic diastolic CHF, SVT/AVNRT s/p ablation, HTN, HLD, anemia, remote gastric CA, and hard of hearing, who presents with complaints of SOB. Cardiology following for acute on chronic diastolic CHF.  Assessment & Plan    1. Acute on chronic diastolic CHF:p/w SOB. BNP 264 (843 on last admission for CHF exacerbation 06/2017). CXR with large left pleural effusion. He underwent thoracentesis 04/13/18 with 1.8L removed. He was started on gentle diuresis with IV lasix , limited by hypotension. UOP with net -1.1L in the last 24 hours; -4.7L this admission. Weight 149>146lbs today. Cr stable. Echo 5/20 with EF 55-60%, G1DD, and moderate AS. LE edema improved from yesterday. Still with LLE/ankle edema. - Nephrology following and increased lasix to 40mg  IV BID in an attempt to improve his sodium. Would favor less aggressive dosing as BP will likely not tolerate BID. Recommend resuming po lasix at discharge - Continue to fluid restrict - 1.5L daily - Continue to monitor strict I&Os and daily weights  2. Hypervolemic hypo-osmolar hyponatremia: Na improved to 125 today (baseline 128) despite not receiving lasix yesterday. Nephrology following; recommending increasing IV lasix to 40mg  BID and to discontinue celebrex as NSAIDS impair salt excretion. Patient remains asymptomatic - Continue management per primary team/ nephrology  3. Large Left Pleural Effusion:likely contributed to SOB on admission. Patient underwent diagnostic/therapeutic thoracentesis 5/21 with -1.8L this admission. CXR post  procedure with small residual effusion.  - Continue management per primary team  4. Aortic Stenosis:remains moderate on echo this admission. Reportedly not a candidate for aortic valve replacement per discussions on prior admissions.  - Continue to monitor routinely  5. Hypoalbuminemia: albumin 2.6. Suspect malnutrition. Likely contributing to LE edema.   From a cardiology standpoint, suspect patient is at baseline today. No significant volume overload on exam today and Na improved to 125, not far from baseline of 128.  Given he is asymptomatic, anticipate he could be discharged from a cardiology standpoint. Recommend ongoing [place] discussion to further determine patient's wishes. He is adamant on going home today where he would like to continue living independently.  For questions or updates, please contact [place] Please consult [email or web address] for contact info under Cardiology/STEMI.      Signed, Abigail Butts, PA-C  04/15/2018, 11:17 AM   [phone]  History and all data above reviewed.  Patient examined.  I agree with the findings as above.  No chest pain.  Wants to go home with or without MD consent. The patient exam reveals COR:  RRR  ,  Lungs: Decreased breath sounds at the baseline  ,  Abd: Positive bowel sounds, no rebound no guarding, Ext    Decreased edema  .  All available labs, radiology testing, previous records reviewed. Agree with documented assessment and plan.   Acute on chronic diastolic HF:  Symptomatically better with thoracentesis.  Lasix adjusted per Dr. Lorrene Reid.  I think he could be discharged from a cardiovascular standpoint.  We talked about salt and fluid restriction.  There is higher amount of sausage gravy in his diet than I think is reasonable.    Jeneen Rinks Carney Saxton  11:52 AM  04/15/2018

## 2018-04-15 NOTE — Consult Note (Signed)
   [facility] CM Inpatient Consult   04/15/2018  Kyle Armstrong 1929-12-25 [id number]   Patient screened for potential [place] Management services. Report iin unit progression meeting states patient is declining to go to a skilled facility which is recommended by Physical Therapist and Occupational Therapist.  Patient is in the [place] [facility] [place] [facility] services under patient's [facility] plan.  Met with the patient at the bedside.  Patient has bilateral hearing aides in and is still very hard of hearing at this visit.  Patient endorses his [facility] and his primary care provider to be Lona Kettle, MD.  Patient wants to go home stating although his son works his daughter is across the street. Patient given a brochure and 24 hour nurse advise line and encouraged to share with his family.  He agreed.  For questions contact:   Natividad Brood, RN BSN [facility] Liaison  [phone] business mobile phone Toll free office [phone]

## 2018-04-15 NOTE — Progress Notes (Signed)
NP paged about b/p med tonight. Patient's b/p was been soft. Awaiting further orders

## 2018-04-15 NOTE — Progress Notes (Signed)
PROGRESS NOTE    Kyle Armstrong  BTD:[id number] DOB: 01-14-1930 DOA: 04/10/2018 PCP: Lawerance Cruel, MD  Outpatient Specialists:     Brief Narrative: Patient is an 84 year old male with history of diastolic congestive heart failure, chronic hyponatremia, moderate aortic stenosis and mild aortic regurgitation, sarcoidosis of the lung, hyperlipidemia, pernicious anemia, hypothyroidism, history of stomach cancer status post radiation therapy and chemotherapy, SVTs and arthritis.  The patient was admitted with worsening SOB, possibly acute on chronic diastolic congestive heart failure, significant left-sided pleural effusion. Patient's BP has been on the low side, therefore, limiting aggressiveness of diuresis. Cardiology team has been consulted. ECHO revealed EF of 10-62%, Grade 1 diastolic dysfunction and mild aortic stenosis. Status post thoracentesis on 5/21, 1.8 L removed, blood pressure low after thoracentesis,. on diuretic antihypertensive medications, SNF or 24/7 supervision with [facility] services  recommended   Plan:- 1)HFpEF-/ Acute on chronic Diastolic congestive Heart Failure: - will continue to Titrate up IV Lasix as blood pressure allows, , continue metoprolol 12.5 mg twice daily with hold parameters, fluid balance remains negative,, last known EF over 55%.  Cardiology consult appreciated.  Fluid balance remains negative  2)Large Left-sided Pleural Effusion: =-  S/p Thoracentesis 04/13/18 with 1.8 L removed, . Effusion likely transudative  3)Hypervolemic Hypo-osmolar Hyponatremia, moderate- - Acute on  Chronic, suspect hypervolemic hyponatremia , hyponatremia persisted despite fluid restriction, d/w Dr Augustin Coupe and Dr Buelah Manis from nephrology service, per nephrology Tolvaptan is not indicated at this time, sodium is up to 125 from  120 range (baseline sodium appears to be around 128).  Nephrologist has discontinued Celebrex and advises that we avoid NSAIDs.  Lasix as ordered by nephrology  service  4)Anemia- - Follow FOBT, B12, folate, ferritin and iron  5) History of hyperlipidemia- - Continue Zocor  6)History of BPH-stable,  Continue Flomax  7)History of hypothyroidism-stable, TSH 1.37, continue levothyroxine  8)Disposition-PT/OT recommends SNF or 24/7 supervision with [facility] services  Recommended, however patient adamantly refuses to go to SNF or to have home health services at home  9)Social/Ethics-care discussed with patient, daughter  Ms Yves Dill phone message left for patient's son Mr Tong Pieczynski...... patient insisted on going home and living independently, he is high risk for falling.  He is a full code   Admission status: Inpatient, tele Disposition- patient is refusing to go to SNF Consults called: IR & cardiology , nephrology DVT Px: Lovenox, SCDs and early ambulation. Code Status: Full Code    Procedures:    thoracentesis 04/13/18 with 1.8 L removed  Echo  Antimicrobials:   None    Subjective: Patient eager to go home, still refusing to go to skilled nursing facility, eating okay, no vomiting or diarrhea  Objective: Vitals:   04/15/18 0318 04/15/18 0734 04/15/18 0852 04/15/18 1124  BP: 106/72 (!) 94/57 95/63 111/67  Pulse: 72 70  64  Resp: 18 20  18   Temp: 97.9 F (36.6 C) 97.6 F (36.4 C)  97.9 F (36.6 C)  TempSrc: Oral Oral  Oral  SpO2: 100% 100%  98%  Weight: 66.3 kg (146 lb 3.2 oz)     Height:        Intake/Output Summary (Last 24 hours) at 04/15/2018 1410 Last data filed at 04/15/2018 1339 Gross per 24 hour  Intake 240 ml  Output 1275 ml  Net -1035 ml   Filed Weights   04/13/18 0500 04/14/18 0329 04/15/18 0318  Weight: 70.4 kg (155 lb 3.2 oz) 67.7 kg (149 lb 3.2 oz)  66.3 kg (146 lb 3.2 oz)    Examination:  General exam: Appears calm and comfortable. Cachectic Respiratory system: Fine bibasilar rales Cardiovascular system: S1 & S2. Gastrointestinal system: Abdomen is nondistended, soft and nontender.Normal  bowel sounds heard. Central nervous system: Alert and oriented. Patient moves all limbs Extremities: 1-1+ bilateral leg edema  Data Reviewed: I have personally reviewed following labs and imaging studies  CBC: Recent Labs  Lab 04/10/18 2255 04/11/18 0502 04/15/18 0410  WBC 7.8 7.1 7.0  HGB 8.6* 9.1* 8.8*  HCT 25.6* 26.8*  26.8* 26.5*  MCV 94.5 91.8 94.0  PLT 243 262 892   Basic Metabolic Panel: Recent Labs  Lab 04/10/18 2255 04/11/18 0502 04/12/18 0608 04/13/18 0519 04/14/18 0439 04/15/18 0410  NA 120*  --  119* 119* 120* 125*  K 4.8  --  4.3 4.2 4.4 4.1  CL 84*  --  80* 79* 81* 86*  CO2 27  --  31 30 32 31  GLUCOSE 127*  --  97 93 96 103*  BUN 18  --  15 15 15 18   CREATININE 0.97 0.98 0.90 0.86 0.84 0.99  CALCIUM 8.7*  --  8.7* 8.3* 8.4* 8.4*   GFR: Estimated Creatinine Clearance: 50.2 mL/min (by C-G formula based on SCr of 0.99 mg/dL). Liver Function Tests: Recent Labs  Lab 04/13/18 0519  AST 22  ALT 9*  ALKPHOS 100  BILITOT 0.7  PROT 4.9*  ALBUMIN 2.6*   No results for input(s): LIPASE, AMYLASE in the last 168 hours. No results for input(s): AMMONIA in the last 168 hours. Coagulation Profile: No results for input(s): INR, PROTIME in the last 168 hours. Cardiac Enzymes: Recent Labs  Lab 04/11/18 0502 04/11/18 1054 04/11/18 1627  TROPONINI <0.03 <0.03 <0.03   BNP (last 3 results) No results for input(s): PROBNP in the last 8760 hours. HbA1C: No results for input(s): HGBA1C in the last 72 hours. CBG: No results for input(s): GLUCAP in the last 168 hours. Lipid Profile: No results for input(s): CHOL, HDL, LDLCALC, TRIG, CHOLHDL, LDLDIRECT in the last 72 hours. Thyroid Function Tests: No results for input(s): TSH, T4TOTAL, FREET4, T3FREE, THYROIDAB in the last 72 hours. Anemia Panel: No results for input(s): VITAMINB12, FOLATE, FERRITIN, TIBC, IRON, RETICCTPCT in the last 72 hours. Urine analysis:    Component Value Date/Time   COLORURINE  YELLOW 02/09/2014 [place] 02/09/2014 1317   LABSPEC 1.012 02/09/2014 1317   PHURINE 6.5 02/09/2014 1317   GLUCOSEU NEGATIVE 02/09/2014 1317   HGBUR NEGATIVE 02/09/2014 1317   BILIRUBINUR NEGATIVE 02/09/2014 1317   KETONESUR NEGATIVE 02/09/2014 1317   PROTEINUR NEGATIVE 02/09/2014 1317   UROBILINOGEN 1.0 02/09/2014 1317   NITRITE NEGATIVE 02/09/2014 1317   LEUKOCYTESUR NEGATIVE 02/09/2014 1317   Sepsis Labs: @LABRCNTIP (procalcitonin:4,lacticidven:4)  ) Recent Results (from the past 240 hour(s))  Culture, body fluid-bottle     Status: None (Preliminary result)   Collection Time: 04/13/18  9:56 AM  Result Value Ref Range Status   Specimen Description PLEURAL  Final   Special Requests LEFT  Final   Culture   Final    NO GROWTH 2 DAYS Performed at [facility], [place] [address], [place], [place] [address]    Report Status PENDING  Incomplete  Gram stain     Status: None   Collection Time: 04/13/18  9:56 AM  Result Value Ref Range Status   Specimen Description PLEURAL LEFT  Final   Special Requests FLUID  Final  Gram Stain   Final    FEW WBC PRESENT,BOTH PMN AND MONONUCLEAR NO ORGANISMS SEEN Performed at [facility], [place] [address], [place], [place] [address]    Report Status 04/13/2018 FINAL  Final         Radiology Studies: No results found.   Scheduled Meds: . aspirin EC  81 mg Oral Daily  . enoxaparin (LOVENOX) injection  40 mg Subcutaneous Q24H  . ferrous sulfate  325 mg Oral TID WC  . furosemide  40 mg Intravenous Q12H  . levothyroxine  50 mcg Oral QAC breakfast  . metoprolol tartrate  12.5 mg Oral BID  . potassium chloride SA  20 mEq Oral Daily  . simvastatin  40 mg Oral QPM  . sodium chloride flush  3 mL Intravenous Q12H  . tamsulosin  0.4 mg Oral Daily  . cyanocobalamin  1,000 mcg Oral Daily   Continuous Infusions: . sodium chloride       LOS: 4 days

## 2018-04-15 NOTE — Progress Notes (Signed)
[facility] Rounding Note  Background: 84 y.o. male PMH CHF gR 1 DD w/ mild aortic stenosis, chronic hyponatremia, moderate AS,  pulmonary sarcoidosis, hypothyroidism and stomach cancer s/p surgery/XRT/chemo. Adm with dyspnea, had left sided pl effusion s/p thoracentesis on 5/21 (1.8L removed). IV diuretics started, weight decrease 70.2kg to 67.7 kg and cumulative net neg of -4310 during this hospitalization. He deniesd any dizziness, thirst, nausea or severe pain. Sodium i8/2018 was 128 and during this hospitalization the SNa has been in the 120 range.  No  SSRI's or thiazides but is on Lovenox during this hospitalization. Asked to see 5/22 for Na 118. ^ UOsm 370 w/Na 120.   Assessment/Recommendations  1. Hyponatremia - Hypervolemic hypo osmolar hyponatremia. Chronic component + superimposed acute/subacute to 120 in setting of SOB (CHF), pleural effusion (s/p 1.8 L thora).  Urine Osm >POsmolality c/w impaired urinary dilution c/w excessive ADH. Cortisol lower limits normal, TSH normal (on T4 repl). I think whole picture fits best with hypervolemic hypoosmolar hyponatremia. CHF/volume overload + pulm sarcoid and COX 2 inhibitor may be playing some role 1. Responding to fluid restriction and smallish doses of furosemide. Needs additional lasix. ^ 40 Q12H IV for right now 2. I would also recommend stopping celebrex as NSAID's and Cox 2 inhibitors can impair water (as well as salt) excretion) (I have stopped it for now) 3. Continue fluid restriction. 4. No indication for use of tolvaptan in this case  2. CHF - continue diuresis for now 3. Left sided pleural effusion s/p thoracentesis 04/13/18 with 1.8L removed 4. Sarcoidosis 5. Hypothyroidism on Synthroid 6. BPH  Jamal Maes, MD [place] pager 04/15/2018, 6:41 AM  Subjective/Interval History:  Dr. Aron Baba consult note from last night reviewed New labs: random cortisol 8.7 lower end of normal Sodium up from 120 to 125 with  continued diuresis (though not a spectacular diuresis) On fluid restriction Very out of sorts and demanding to go home "this is costing me $2000/day"  Objective Vital signs in last 24 hours: Vitals:   04/14/18 0932 04/14/18 1147 04/14/18 1958 04/15/18 0318  BP: (!) 88/48 105/76 107/65 106/72  Pulse: 72 65 77 72  Resp:    18  Temp:  97.9 F (36.6 C) 98.1 F (36.7 C) 97.9 F (36.6 C)  TempSrc:  Oral Oral Oral  SpO2:  96%  100%  Weight:    66.3 kg (146 lb 3.2 oz)  Height:       Weight change: -1.361 kg (-3 lb)  Intake/Output Summary (Last 24 hours) at 04/15/2018 0641 Last data filed at 04/15/2018 0100 Gross per 24 hour  Intake 0 ml  Output 1150 ml  Net -1150 ml   Physical Exam:  Blood pressure 106/72, pulse 72, temperature 97.9 F (36.6 C), temperature source Oral, resp. rate 18, height 5\' 8"  (1.727 m), weight 66.3 kg (146 lb 3.2 oz), SpO2 100 %.   Sitting on BSC Very grumpy, quite HOH VS as noted Cannot see neck veins Basilar crackles bilaterally, fair air movement Regular rhythm S1S2 No S3 1+ pitting LE edema   Recent Labs  Lab 04/10/18 2255 04/11/18 0502 04/12/18 0608 04/13/18 0519 04/14/18 0439 04/15/18 0410  NA 120*  --  119* 119* 120* 125*  K 4.8  --  4.3 4.2 4.4 4.1  CL 84*  --  80* 79* 81* 86*  CO2 27  --  31 30 32 31  GLUCOSE 127*  --  97 93 96 103*  BUN 18  --  15 15 15 18   CREATININE 0.97 0.98 0.90 0.86 0.84 0.99  CALCIUM 8.7*  --  8.7* 8.3* 8.4* 8.4*    Recent Labs  Lab 04/13/18 0519  AST 22  ALT 9*  ALKPHOS 100  BILITOT 0.7  PROT 4.9*  ALBUMIN 2.6*    Recent Labs  Lab 04/10/18 2255 04/11/18 0502 04/15/18 0410  WBC 7.8 7.1 7.0  HGB 8.6* 9.1* 8.8*  HCT 25.6* 26.8*  26.8* 26.5*  MCV 94.5 91.8 94.0  PLT 243 262 259    Recent Labs  Lab 04/11/18 0502 04/11/18 1054 04/11/18 1627  TROPONINI <0.03 <0.03 <0.03   Iron Studies:  Recent Labs  Lab 04/11/18 0502  IRON 29*  TIBC 235*  FERRITIN 661*   Studies/Results: Dg Chest  1 View  Result Date: 04/13/2018 CLINICAL DATA:  Thoracentesis on the left EXAM: CHEST  1 VIEW COMPARISON:  04/10/2018 FINDINGS: Significantly decreased left pleural effusion. No pneumothorax or visible re-expansion edema. Similar appearance of bilateral interstitial opacity.  Cardiomegaly. IMPRESSION: No acute finding after left thoracentesis. Residual pleural fluid is small volume when accounting for volume loss at the left base. Electronically Signed   By: Monte Fantasia M.D.   On: 04/13/2018 10:17   Ir Thoracentesis Asp Pleural Space W/img Guide  Result Date: 04/13/2018 INDICATION: Congestive heart failure. Left pleural effusion. Request for diagnostic and therapeutic thoracentesis EXAM: ULTRASOUND GUIDED LEFT THORACENTESIS MEDICATIONS: 1% Lidocaine = 10 mL COMPLICATIONS: None immediate. PROCEDURE: An ultrasound guided thoracentesis was thoroughly discussed with the patient and questions answered. The benefits, risks, alternatives and complications were also discussed. The patient understands and wishes to proceed with the procedure. Written consent was obtained. Ultrasound was performed to localize and mark an adequate pocket of fluid in the left chest. The area was then prepped and draped in the normal sterile fashion. 1% Lidocaine was used for local anesthesia. Under ultrasound guidance a 6 Fr Safe-T-Centesis catheter was introduced. Thoracentesis was performed. The catheter was removed and a dressing applied. FINDINGS: A total of approximately 1.8 liters of bloody fluid was removed. Samples were sent to the laboratory as requested by the clinical team. IMPRESSION: Successful ultrasound guided left thoracentesis yielding 1.8 liters of pleural fluid. No pneumothorax on post procedure chest X-ray. Read by: Gareth Eagle, PA-C Electronically Signed   By: Lucrezia Europe M.D.   On: 04/13/2018 10:54   Medications: . sodium chloride     . aspirin EC  81 mg Oral Daily  . celecoxib  200 mg Oral BID  .  enoxaparin (LOVENOX) injection  40 mg Subcutaneous Q24H  . ferrous sulfate  325 mg Oral TID WC  . furosemide  20 mg Intravenous Daily  . levothyroxine  50 mcg Oral QAC breakfast  . metoprolol tartrate  12.5 mg Oral BID  . potassium chloride SA  20 mEq Oral Daily  . simvastatin  40 mg Oral QPM  . sodium chloride flush  3 mL Intravenous Q12H  . tamsulosin  0.4 mg Oral Daily  . cyanocobalamin  1,000 mcg Oral Daily    I  have reviewed scheduled and prn medications.

## 2018-04-15 NOTE — Plan of Care (Signed)
  Problem: Education: Goal: Ability to demonstrate management of disease process will improve Outcome: Progressing   Problem: Education: Goal: Ability to verbalize understanding of medication therapies will improve Outcome: Progressing   Problem: Safety: Goal: Ability to remain free from injury will improve Outcome: Progressing

## 2018-04-15 NOTE — Progress Notes (Signed)
PT Cancellation Note  Patient Details Name: ROCHELL MABIE MRN: [id number] DOB: [birth year]   Cancelled Treatment:    Reason Eval/Treat Not Completed: Other (comment).  Refused visit stating he is going home with son imminently.  Pt does not have orders for DC yet.     Ramond Dial 04/15/2018, 3:31 PM   Mee Hives, PT MS Acute Rehab Dept. Number: [place] [facility] [place]

## 2018-04-16 ENCOUNTER — Other Ambulatory Visit: Payer: Self-pay

## 2018-04-16 LAB — RENAL FUNCTION PANEL
Albumin: 2.6 g/dL — ABNORMAL LOW (ref 3.5–5.0)
Anion gap: 8 (ref 5–15)
BUN: 22 mg/dL — AB (ref 6–20)
CALCIUM: 8.5 mg/dL — AB (ref 8.9–10.3)
CO2: 31 mmol/L (ref 22–32)
Chloride: 88 mmol/L — ABNORMAL LOW (ref 101–111)
Creatinine, Ser: 0.97 mg/dL (ref 0.61–1.24)
GFR calc non Af Amer: >60 mL/min (ref >60)
Glucose, Bld: 99 mg/dL (ref 65–99)
Phosphorus: 3.3 mg/dL (ref 2.5–4.6)
Potassium: 4.2 mmol/L (ref 3.5–5.1)
SODIUM: 127 mmol/L — AB (ref 135–145)

## 2018-04-16 MED ORDER — FUROSEMIDE 40 MG PO TABS
40.0000 mg | ORAL_TABLET | Freq: Every day | ORAL | Status: DC
  Administered 2018-04-16: 40 mg via ORAL
  Filled 2018-04-16: qty 1

## 2018-04-16 MED ORDER — LEVOTHYROXINE SODIUM 50 MCG PO TABS: 50.0000 ug | ORAL_TABLET | Freq: Every day | ORAL | 2 refills | Status: DC

## 2018-04-16 MED ORDER — ALBUTEROL SULFATE HFA 108 (90 BASE) MCG/ACT IN AERS: 2.0000 | INHALATION_SPRAY | Freq: Four times a day (QID) | RESPIRATORY_TRACT | 2 refills | Status: DC | PRN

## 2018-04-16 MED ORDER — SIMVASTATIN 40 MG PO TABS: 40.0000 mg | ORAL_TABLET | Freq: Every evening | ORAL | 2 refills | Status: DC

## 2018-04-16 MED ORDER — FUROSEMIDE 20 MG PO TABS: 20.0000 mg | ORAL_TABLET | Freq: Every day | ORAL | 1 refills | Status: DC

## 2018-04-16 MED ORDER — TAMSULOSIN HCL 0.4 MG PO CAPS: 0.4000 mg | ORAL_CAPSULE | Freq: Every day | ORAL | 3 refills | Status: DC

## 2018-04-16 MED ORDER — METOPROLOL TARTRATE 25 MG PO TABS: 12.5000 mg | ORAL_TABLET | Freq: Two times a day (BID) | ORAL | 2 refills | Status: DC

## 2018-04-16 NOTE — Progress Notes (Signed)
[facility] Rounding Note  Background: 84 y.o. male PMH CHF Gr 1 DD w/ mild aortic stenosis, chronic hyponatremia, moderate AS,  pulmonary sarcoidosis, hypothyroidism and stomach cancer s/p surgery/XRT/chemo. Adm with dyspnea, had left sided pl effusion s/p thoracentesis on 5/21 (1.8L removed). IV diuretics started, weight decrease 70.2kg to 67.7 kg and cumulative net neg of -4310 during this hospitalization. He deniesd any dizziness, thirst, nausea or severe pain. Sodium 06/2017 was 128 and during this hospitalization the SNa has been in the 120 range.  No  SSRI's or thiazides but is on Lovenox during this hospitalization. Asked to see 5/22 for Na 118. ^ UOsm 370 w/Na 120. Responded to furosemide, water restriction and discontinuation of celebrex.   Assessment/Recommendations  1. Hyponatremia - Chronic component + superimposed acute/subacute to 120 in setting of SOB (CHF), pleural effusion (s/p 1.8 L thora).  Urine Osm >POsmolality c/w impaired urinary dilution c/w excessive ADH. Cortisol lower limits normal, TSH normal (on T4 repl). I think whole picture fit best with hypervolemic hypoosmolar hyponatremia. CHF/volume overload + pulm sarcoid and COX 2 inhibitor all probably operative 1. Responded to fluid restriction and smallish doses of furosemide. Dose ^ 40 Q12H IV yesterday but never got that dose. Just got 2 doses of 20. Na however continues to improve and with lowish BP I think fine to go back to po lasix. I have resumed dose at 40 mg po daily - cardiology may adjust as they see fit. .  2. Stopped celebrex as NSAID's and Cox 2 inhibitors can impair water (as well as salt) excretion). Would not resume 3. Continue fluid restriction. 4. No indication for use of tolvaptan in this case 5. Renal will sign off at this time - continue monitoring Na and call if questions  2. CHF - continue diuresis for now as tolerated 3. Left sided pleural effusion s/p thoracentesis 04/13/18 with 1.8L  removed 4. Sarcoidosis 5. Hypothyroidism on Synthroid 6. BPH  Jamal Maes, MD [place] pager 04/15/2018, 6:41 AM  Subjective/Interval History:  Na continues to to improve  Objective Vital signs in last 24 hours: Vitals:   04/16/18 0600 04/16/18 0743 04/16/18 0827 04/16/18 1002  BP: 121/70 99/61 100/61 (!) 88/58  Pulse: 71 71    Resp: 18 18    Temp: 98.2 F (36.8 C) 98 F (36.7 C)    TempSrc: Oral Oral    SpO2: 100% 98%    Weight: 65 kg (143 lb 4.8 oz)     Height:       Weight change: -1.315 kg (-2 lb 14.4 oz)  Intake/Output Summary (Last 24 hours) at 04/16/2018 1103 Last data filed at 04/16/2018 1044 Gross per 24 hour  Intake 605 ml  Output 1551 ml  Net -946 ml   Physical Exam:  Blood pressure (!) 88/58, pulse 71, temperature 98 F (36.7 C), temperature source Oral, resp. rate 18, height 5\' 8"  (1.727 m), weight 65 kg (143 lb 4.8 oz), SpO2 98 %.   Sitting on BSC Very grumpy, quite HOH VS as noted Cannot see neck veins Basilar crackles bilaterally, fair air movement Regular rhythm S1S2 No S3 1+ pitting LE edema   Recent Labs  Lab 04/10/18 2255 04/11/18 0502 04/12/18 0608 04/13/18 0519 04/14/18 0439 04/15/18 0410 04/16/18 0617  NA 120*  --  119* 119* 120* 125* 127*  K 4.8  --  4.3 4.2 4.4 4.1 4.2  CL 84*  --  80* 79* 81* 86* 88*  CO2 27  --  31 30 32 31 31  GLUCOSE 127*  --  97 93 96 103* 99  BUN 18  --  15 15 15 18  22*  CREATININE 0.97 0.98 0.90 0.86 0.84 0.99 0.97  CALCIUM 8.7*  --  8.7* 8.3* 8.4* 8.4* 8.5*  PHOS  --   --   --   --   --   --  3.3    Recent Labs  Lab 04/13/18 0519 04/16/18 0617  AST 22  --   ALT 9*  --   ALKPHOS 100  --   BILITOT 0.7  --   PROT 4.9*  --   ALBUMIN 2.6* 2.6*    Recent Labs  Lab 04/10/18 2255 04/11/18 0502 04/15/18 0410  WBC 7.8 7.1 7.0  HGB 8.6* 9.1* 8.8*  HCT 25.6* 26.8*  26.8* 26.5*  MCV 94.5 91.8 94.0  PLT 243 262 259    Recent Labs  Lab 04/11/18 0502  04/11/18 1054 04/11/18 1627  TROPONINI <0.03 <0.03 <0.03   Iron Studies:  Recent Labs  Lab 04/11/18 0502  IRON 29*  TIBC 235*  FERRITIN 661*   Medications: . sodium chloride     . aspirin EC  81 mg Oral Daily  . enoxaparin (LOVENOX) injection  40 mg Subcutaneous Q24H  . ferrous sulfate  325 mg Oral TID WC  . furosemide  40 mg Intravenous Q12H  . levothyroxine  50 mcg Oral QAC breakfast  . metoprolol tartrate  12.5 mg Oral BID  . potassium chloride SA  20 mEq Oral Daily  . simvastatin  40 mg Oral QPM  . sodium chloride flush  3 mL Intravenous Q12H  . tamsulosin  0.4 mg Oral Daily  . cyanocobalamin  1,000 mcg Oral Daily    I  have reviewed scheduled and prn medications.

## 2018-04-16 NOTE — Discharge Instructions (Signed)
1) use oxygen as prescribed 2) use walker for ambulation all the time 3) fall precautions 4) if you change your mind and want to go to skilled nursing facility rehab, then please talk to your primary care doctor or the social worker to help you get into skilled nursing facility rehab post discharge from the hospital 5) take medications as prescribed 6) check your weight daily, call if you gain more than 3 pounds in 1 day or more than 5 pounds in 1 week 7) please wear your lifeline alert button around your neck at [place] 8) follow-up with PCP for repeat sodium/BMP check within 1 week 9)Avoid ibuprofen/Advil/Aleve/Motrin/Goody Powders/Naproxen/BC powders/Meloxicam/Diclofenac/Indomethacin and other Nonsteroidal anti-inflammatory medications as these will make you more likely to bleed and can cause stomach ulcers, can also cause Kidney problems.

## 2018-04-16 NOTE — Progress Notes (Signed)
Progress Note  Patient Name: Kyle Armstrong Date of Encounter: 04/16/2018  Primary Cardiologist: Larae Grooms, MD   Subjective   No acute SOB.  He wants to go home.    Inpatient Medications    Scheduled Meds: . aspirin EC  81 mg Oral Daily  . enoxaparin (LOVENOX) injection  40 mg Subcutaneous Q24H  . ferrous sulfate  325 mg Oral TID WC  . furosemide  40 mg Oral Daily  . levothyroxine  50 mcg Oral QAC breakfast  . metoprolol tartrate  12.5 mg Oral BID  . potassium chloride SA  20 mEq Oral Daily  . simvastatin  40 mg Oral QPM  . sodium chloride flush  3 mL Intravenous Q12H  . tamsulosin  0.4 mg Oral Daily  . cyanocobalamin  1,000 mcg Oral Daily   Continuous Infusions: . sodium chloride     PRN Meds: sodium chloride, acetaminophen, ipratropium-albuterol, lidocaine (PF), ondansetron (ZOFRAN) IV, sodium chloride flush   Vital Signs    Vitals:   04/16/18 0600 04/16/18 0743 04/16/18 0827 04/16/18 1002  BP: 121/70 99/61 100/61 (!) 88/58  Pulse: 71 71    Resp: 18 18    Temp: 98.2 F (36.8 C) 98 F (36.7 C)    TempSrc: Oral Oral    SpO2: 100% 98%    Weight: 143 lb 4.8 oz (65 kg)     Height:        Intake/Output Summary (Last 24 hours) at 04/16/2018 1129 Last data filed at 04/16/2018 1044 Gross per 24 hour  Intake 605 ml  Output 1451 ml  Net -846 ml   Filed Weights   04/14/18 0329 04/15/18 0318 04/16/18 0600  Weight: 149 lb 3.2 oz (67.7 kg) 146 lb 3.2 oz (66.3 kg) 143 lb 4.8 oz (65 kg)    Telemetry    NSR with occasional PACs; 1st degree AV block - Personally Reviewed  Physical Exam   GEN: No  acute distress.   Frail.   Neck: No  JVD Cardiac: RRR, 3/6 apical systolic murmur, no diastolic murmurs, rubs, or gallops.  Respiratory:     Decreased breath sounds with diffuse expiratory wheezing and fine crackles  GI: Soft, nontender, non-distended, normal bowel sounds  MS:  No edema; No deformity. Neuro:   Nonfocal  Psych: Oriented and appropriate     Labs    Chemistry Recent Labs  Lab 04/13/18 0519 04/14/18 0439 04/15/18 0410 04/16/18 0617  NA 119* 120* 125* 127*  K 4.2 4.4 4.1 4.2  CL 79* 81* 86* 88*  CO2 30 32 31 31  GLUCOSE 93 96 103* 99  BUN 15 15 18  22*  CREATININE 0.86 0.84 0.99 0.97  CALCIUM 8.3* 8.4* 8.4* 8.5*  PROT 4.9*  --   --   --   ALBUMIN 2.6*  --   --  2.6*  AST 22  --   --   --   ALT 9*  --   --   --   ALKPHOS 100  --   --   --   BILITOT 0.7  --   --   --   GFRNONAA >60 >60 >60 >60  GFRAA >60 >60 >60 >60  ANIONGAP 10 7 8 8      Hematology Recent Labs  Lab 04/10/18 2255 04/11/18 0502 04/15/18 0410  WBC 7.8 7.1 7.0  RBC 2.71* 2.92* 2.82*  HGB 8.6* 9.1* 8.8*  HCT 25.6* 26.8*  26.8* 26.5*  MCV 94.5 91.8 94.0  MCH 31.7  31.2 31.2  MCHC 33.6 34.0 33.2  RDW 12.6 12.5 12.7  PLT 243 262 259    Cardiac Enzymes Recent Labs  Lab 04/11/18 0502 04/11/18 1054 04/11/18 1627  TROPONINI <0.03 <0.03 <0.03    Recent Labs  Lab 04/10/18 2309  TROPIPOC 0.00     BNP Recent Labs  Lab 04/10/18 2255 04/13/18 1350  BNP 264.2* 148.2*     DDimer No results for input(s): DDIMER in the last 168 hours.   Radiology    No results found.  Cardiac Studies   Echocardiogram 04/12/18: Study Conclusions  - Left ventricle: The cavity size was normal. There was moderate concentric hypertrophy. Systolic function was normal. The estimated ejection fraction was in the range of 55% to 60%. Images were inadequate for LV wall motion assessment. There was an increased relative contribution of atrial contraction to ventricular filling. Doppler parameters are consistent with abnormal left ventricular relaxation (grade 1 diastolic dysfunction). - Aortic valve: Poorly visualized. Trileaflet; severely thickened, severely calcified leaflets. There was mild stenosis. Peak velocity (S): 242 cm/s. Mean gradient (S): 14 mm Hg. Valve area (VTI): 0.89 cm^2. Valve area (Vmax): 0.96 cm^2. Valve  area (Vmean): 0.87 cm^2. - Mitral valve: Poorly visualized. Calcified annulus. - Left atrium: The atrium was moderately dilated. - Tricuspid valve: Poorly visualized. - Pulmonic valve: Poorly visualized. - Pericardium, extracardiac: There was a left pleural effusion.    Patient Profile     84 y.o.malewith PMH of chronic diastolic CHF, SVT/AVNRT s/p ablation, HTN, HLD, anemia, remote gastric CA, and hard of hearing, who presents with complaints of SOB. Cardiology following for acute on chronic diastolic CHF.  Assessment & Plan    Acute on chronic diastolic CHF:   Agree with discharge dose of Lasix 40 mg.  I have discussed fluid restriction and salt management.  I would suggest home health nursing and palliative care for goals of care with the understanding that he needs monitoring at home with home health if he is going to be able to stay out of the hospital.    Hypervolemic hypo-osmolar hyponatremia:    As above.   Large Left Pleural Effusion:  Improved with nearly two liter thoracentesis.  This can be followed as an out patient.  Most of his improvement came from this intervention this admission.    Aortic Stenosis:  Remains moderate on echo this admission. Medical management.        For questions or updates, please contact [place] Please consult [email or web address] for contact info under Cardiology/STEMI.      Signed, Minus Breeding, MD  04/16/2018, 11:29 AM   [phone]

## 2018-04-16 NOTE — Care Management Note (Addendum)
Case Management Note  Patient Details  Name: Kyle Armstrong MRN: [id number] Date of Birth: 13-Oct-1930  Subjective/Objective:  Pt presented for Acute Congestive Heart Failure. PTA from home alone. Son lives behind the patient in a camper 2/2 high temperatures in the home. Pt has DME RW. Pt is HOH- CM was able to reach out to daughter this am in regards to disposition needs. Pt is refusing SNF at this time and wants to transition home.                 Action/Plan: CM spoke with daughter and she runs a [employer] on the property and states she can look in on him, however he will not have 24 hour supervision. Daughter states that patient is not able to afford 24 hour supervision. Family is aware of recommendations and states that pt is stubborn and he is going to do what he wants to do. CM did speak with daughter in regards to [place] [facility] [place]. Agency List provided and referral made to [facility]. Referral made to [facility] to begin within 24-48 hours of transitioning home. Family aware that services may be delayed 2/2 Holiday. Daughter to provide transportation home. No further needs from CM at this time.   Expected Discharge Date:  04/16/18               Expected Discharge Plan:  [place]  In-House Referral:  NA  Discharge planning Services  CM Consult  Post Acute Care Choice:  Home Health Choice offered to:  Patient, Adult Children  DME Arranged:  N/A DME Agency:  NA  HH Arranged:  RN, Disease Management, PT, Social Work [employer] Agency:  [facility]  Status of Service:  Completed, signed off  If discussed at [employer], dates discussed:    Additional Comments:  Bethena Roys, RN 04/16/2018, 11:52 AM

## 2018-04-16 NOTE — Discharge Summary (Signed)
Kyle Armstrong, is a 84 y.o. male  DOB [birth year]  MRN [id number].  Admission date:  04/10/2018  Admitting Physician  Harvie Bridge, DO  Discharge Date:  04/16/2018   Primary MD  Lawerance Cruel, MD  Recommendations for primary care physician for things to follow:  1) use oxygen as prescribed 2) use walker for ambulation all the time 3) fall precautions 4) if you change your mind and want to go to skilled nursing facility rehab, then please talk to your primary care doctor or the social worker to help you get into skilled nursing facility rehab post discharge from the hospital 5) take medications as prescribed 6) check your weight daily, call if you gain more than 3 pounds in 1 day or more than 5 pounds in 1 week 7)Please wear your lifeline alert button around your neck at [facility] 8)Follow-up with PCP for repeat sodium/BMP check within 1 week 9)Avoid ibuprofen/Advil/Aleve/Motrin/Goody Powders/Naproxen/BC powders/Meloxicam/Diclofenac/Indomethacin and other Nonsteroidal anti-inflammatory medications as these will make you more likely to bleed and can cause stomach ulcers, can also cause Kidney problems.    Admission Diagnosis  Hyponatremia [E87.1] Pleural effusion [B16] Acute systolic congestive heart failure (HCC) [I50.21]   Discharge Diagnosis  Hyponatremia [E87.1] Pleural effusion [O06] Acute systolic congestive heart failure (HCC) [I50.21]   Active Problems:   Acute congestive heart failure (HCC)      Past Medical History:  Diagnosis Date  . Allergy   . Arthritis   . Eczema   . Gastric cancer ([place]) 10/24/2013  . Hx of radiation therapy 11/10/13-12/21/13   gastric ca, total 50.4Gy  . Hyperlipidemia   . Hypothyroidism   . Lazy eye of right side   . Pernicious anemia   . SVT (supraventricular tachycardia) (HCC)    AVNRT s/p ablation by Dr Rayann Heman    Past Surgical History:    Procedure Laterality Date  . EP study and ablation  07/16/12   slow pathway ablation for AVNRT by DR Allred  . HERNIA REPAIR  1980's   inguinal done x 2  . IR THORACENTESIS ASP PLEURAL SPACE W/IMG GUIDE  04/13/2018  . LAPAROSCOPIC CHOLECYSTECTOMY W/ CHOLANGIOGRAPHY  2011  . LAPAROSCOPIC PARTIAL GASTRECTOMY N/A 02/15/2014   Procedure: LAPAROSCOPIC diagnositc, distal gastrostomy  feeding jejunostomy;  Surgeon: Stark Klein, MD;  Location: [facility] ORS;  Service: General;  Laterality: N/A;  . LAPAROSCOPIC [place] Bilateral 2007   bil recurrent inguinal hernias  . SUPRAVENTRICULAR TACHYCARDIA ABLATION Bilateral 07/16/2012   Procedure: SUPRAVENTRICULAR TACHYCARDIA ABLATION;  Surgeon: Thompson Grayer, MD;  Location: [facility] CATH LAB;  Service: Cardiovascular;  Laterality: Bilateral;       HPI  from the history and physical done on the day of admission:    HPI: Kyle Armstrong is a 84 y.o. male with a known history of CHF, HLD presents to the emergency department for evaluation of SOB.  Patient was in a usual state of health until the last few days when the patient's is been experiencing worsening dyspnea on exertion,  lower extremity edema, cough and chest pain. Patient does not give many details or answer any questions. He is hard of hearing.   Patient denies fevers/chills, weakness, dizziness, chest pain, N/V/C/D, abdominal pain, dysuria/frequency, changes in mental status.    Otherwise there has been no change in status. Patient has been taking medication as prescribed and there has been no recent change in medication or diet.  No recent antibiotics.  There has been no recent illness, hospitalizations, travel or sick contacts.    EMS/ED Course: Patient received IV Lasix. Medical admission has been requested for further management of CHF exacerbation, pleural effusion.   Hospital Course:   Brief Narrative: Patient is an 84 year old male with history of diastolic  congestive heart failure, chronic hyponatremia, moderate aortic stenosis and mild aortic regurgitation, sarcoidosis of the lung, hyperlipidemia, pernicious anemia, hypothyroidism, history of stomach cancer status post radiation therapy and chemotherapy, SVTs and arthritis. The patient was admitted with worsening SOB, possibly acute on chronic diastolic congestive heart failure, significant left-sided pleural effusion. Patient's BP has been on the low side, therefore, limiting aggressiveness of diuresis. Cardiology team has been consulted. ECHO revealed EF of 44-96%, Grade 1 diastolic dysfunction and mild aortic stenosis. Status post thoracentesis on 5/21, 1.8 L removed, blood pressure low after thoracentesis,. on diuretic antihypertensive medications, SNF or 24/7 supervision with [facility] services  recommended   Plan:- 1)HFpEF-/ Acute on chronic Diastolic congestive Heart Failure:-Soft blood pressure limits ability to titrate Lasix, cardiologist had recommended Lasix 40 mg daily upon discharge, however due to soft BP patient will be discharged on Lasix 20 mg daily , continue metoprolol 12.5 mg twice daily,  last known EF over 55%.  Cardiology consult appreciated.  Fluid balance remains negative  2)Large Left-sided Pleural Effusion: =-  S/p Thoracentesis 04/13/18 with 1.8 L removed, patient is symptomatically much better after Thoracentensis. Effusion likely transudative  3)Hypervolemic Hypo-osmolar Hyponatremia, moderate- - Acute on  Chronic, suspect hypervolemic hyponatremia , hyponatremia persisted despite fluid restriction, d/w Dr Augustin Coupe and Dr Buelah Manis from nephrology service, per nephrology Tolvaptan is not indicated at this time, sodium is up to 127 from  120 range (baseline sodium appears to be around 128).  Nephrologist has discontinued Celebrex and advises that we avoid NSAIDs)  Lasix 20 mg daily  4)Anemia- -hemoglobin is around 9, continue iron supplementation\\  5) History ofhyperlipidemia- -  ContinueZocor  6)History ofBPH-stable,  ContinueFlomax  7)History ofhypothyroidism-stable, TSH 1.37, continue levothyroxine  8)Disposition-PT/OT recommends SNF or 24/7 supervision with [facility] services  Recommended, however patient adamantly refuses to go to SNF, patient initially refused to have home health services at home, after further discussion with patient and family members he says he will consider home health services. patient is refusing to go to SNF, after discussion with patient's family members , patient will be reluctantly discharged home with home health services, patient needs 24/7 supervision, patient is adamant that if not discharged home TODAY he would leave AMA  9)Social/Ethics-care discussed with patient, daughter  Ms Yves Dill phone message left for patient's son Mr Ceaser Ebeling...... patient insisted on going home and living independently, he is high risk for falling.  He is a full code  Consults called:IR & cardiology, nephrology  Code Status:Full Code  Procedures:    thoracentesis 04/13/18 with 1.8 L removed  Echo  Discharge Condition: stable, high fall risk  Follow UP  Follow-up Information    Lawerance Cruel, MD. Go on 04/23/2018.   Specialty:  Family Medicine Why:  @  1:45 pm Contact information: [place] [address] [phone]        [facility] Follow up.   Specialty:  Home Health Services Why:  Registered Nurse, Physical Therapy, Social Worker.  Contact information: [place] [address] [phone]            Diet and Activity recommendation:  As advised  Discharge Instructions     Discharge Instructions    (HEART FAILURE PATIENTS) Call MD:  Anytime you have any of the following symptoms: 1) 3 pound weight gain in 24 hours or 5 pounds in 1 week 2) shortness of breath, with or without a dry hacking cough 3) swelling in the hands, feet or  stomach 4) if you have to sleep on extra pillows at night in order to breathe.   Complete by:  As directed    Call MD for:  difficulty breathing, headache or visual disturbances   Complete by:  As directed    Call MD for:  persistant dizziness or light-headedness   Complete by:  As directed    Call MD for:  persistant nausea and vomiting   Complete by:  As directed    Call MD for:  severe uncontrolled pain   Complete by:  As directed    Call MD for:  temperature >100.4   Complete by:  As directed    Diet - low sodium heart healthy   Complete by:  As directed    Discharge instructions   Complete by:  As directed    1) use oxygen as prescribed 2) use walker for ambulation all the time 3) fall precautions 4) if you change your mind and want to go to skilled nursing facility rehab, then please talk to your primary care doctor or the social worker to help you get into skilled nursing facility rehab post discharge from the hospital 5) take medications as prescribed 6) check your weight daily, call if you gain more than 3 pounds in 1 day or more than 5 pounds in 1 week 7) please wear your lifeline alert button around your neck at ALL TIMES 8) follow-up with PCP for repeat sodium/BMP check within 1 week 9)Avoid ibuprofen/Advil/Aleve/Motrin/Goody Powders/Naproxen/BC powders/Meloxicam/Diclofenac/Indomethacin and other Nonsteroidal anti-inflammatory medications as these will make you more likely to bleed and can cause stomach ulcers, can also cause Kidney problems.   Increase activity slowly   Complete by:  As directed         Discharge Medications     Allergies as of 04/16/2018      Reactions   Lipitor [atorvastatin] Other (See Comments)   Leg cramps   Amoxicillin Rash   rash   Augmentin [amoxicillin-pot Clavulanate] Rash      Medication List    STOP taking these medications   celecoxib 200 MG capsule Commonly known as:  CELEBREX     TAKE these medications   albuterol 108 (90  Base) MCG/ACT inhaler Commonly known as:  PROVENTIL HFA;VENTOLIN HFA Inhale 2 puffs into the lungs every 6 (six) hours as needed for wheezing or shortness of breath.   aspirin 81 MG EC tablet Take 1 tablet (81 mg total) by mouth daily.   cyanocobalamin 1000 MCG tablet Take 1,000 mcg by mouth daily.   ferrous sulfate 325 (65 FE) MG EC tablet Take 325 mg by mouth 3 (three) times daily with meals.   furosemide 20 MG tablet Commonly known as:  LASIX Take  1 tablet (20 mg total) by mouth daily. What changed:  Another medication with the same name was removed. Continue taking this medication, and follow the directions you see here.   levothyroxine 50 MCG tablet Commonly known as:  SYNTHROID, LEVOTHROID Take 1 tablet (50 mcg total) by mouth daily before breakfast.   metoprolol tartrate 25 MG tablet Commonly known as:  LOPRESSOR Take 0.5 tablets (12.5 mg total) by mouth 2 (two) times daily.   potassium chloride SA 20 MEQ tablet Commonly known as:  K-DUR,KLOR-CON Take 1 tablet (20 mEq total) by mouth daily.   simvastatin 40 MG tablet Commonly known as:  ZOCOR Take 1 tablet (40 mg total) by mouth every evening.   tamsulosin 0.4 MG Caps capsule Commonly known as:  FLOMAX Take 1 capsule (0.4 mg total) by mouth daily.       Major procedures and Radiology Reports - PLEASE review detailed and final reports for all details, in brief -      Dg Chest 1 View  Result Date: 04/13/2018 CLINICAL DATA:  Thoracentesis on the left EXAM: CHEST  1 VIEW COMPARISON:  04/10/2018 FINDINGS: Significantly decreased left pleural effusion. No pneumothorax or visible re-expansion edema. Similar appearance of bilateral interstitial opacity.  Cardiomegaly. IMPRESSION: No acute finding after left thoracentesis. Residual pleural fluid is small volume when accounting for volume loss at the left base. Electronically Signed   By: Monte Fantasia M.D.   On: 04/13/2018 10:17   Dg Chest 2 View  Result Date:  04/10/2018 CLINICAL DATA:  CHF, shortness of Breath EXAM: CHEST - 2 VIEW COMPARISON:  06/21/2017 FINDINGS: Enlarging left effusion, now large. Left base atelectasis. Cardiomegaly with vascular congestion. Diffuse interstitial changes throughout the lungs again noted, likely fibrotic changes. IMPRESSION: Enlarging left effusion, now large.  Left base atelectasis. Cardiomegaly, vascular congestion. Electronically Signed   By: Rolm Baptise M.D.   On: 04/10/2018 23:19   Ir Thoracentesis Asp Pleural Space W/img Guide  Result Date: 04/13/2018 INDICATION: Congestive heart failure. Left pleural effusion. Request for diagnostic and therapeutic thoracentesis EXAM: ULTRASOUND GUIDED LEFT THORACENTESIS MEDICATIONS: 1% Lidocaine = 10 mL COMPLICATIONS: None immediate. PROCEDURE: An ultrasound guided thoracentesis was thoroughly discussed with the patient and questions answered. The benefits, risks, alternatives and complications were also discussed. The patient understands and wishes to proceed with the procedure. Written consent was obtained. Ultrasound was performed to localize and mark an adequate pocket of fluid in the left chest. The area was then prepped and draped in the normal sterile fashion. 1% Lidocaine was used for local anesthesia. Under ultrasound guidance a 6 Fr Safe-T-Centesis catheter was introduced. Thoracentesis was performed. The catheter was removed and a dressing applied. FINDINGS: A total of approximately 1.8 liters of bloody fluid was removed. Samples were sent to the laboratory as requested by the clinical team. IMPRESSION: Successful ultrasound guided left thoracentesis yielding 1.8 liters of pleural fluid. No pneumothorax on post procedure chest X-ray. Read by: Gareth Eagle, PA-C Electronically Signed   By: Lucrezia Europe M.D.   On: 04/13/2018 10:54    Micro Results     Recent Results (from the past 240 hour(s))  Culture, body fluid-bottle     Status: None (Preliminary result)   Collection Time:  04/13/18  9:56 AM  Result Value Ref Range Status   Specimen Description PLEURAL  Final   Special Requests LEFT  Final   Culture   Final    NO GROWTH 3 DAYS Performed at [facility], [address]  [address], [place], [place] [address]    Report Status PENDING  Incomplete  Gram stain     Status: None   Collection Time: 04/13/18  9:56 AM  Result Value Ref Range Status   Specimen Description PLEURAL LEFT  Final   Special Requests FLUID  Final   Gram Stain   Final    FEW WBC PRESENT,BOTH PMN AND MONONUCLEAR NO ORGANISMS SEEN Performed at [facility], [place] [address] [place], [place] [address]    Report Status 04/13/2018 FINAL  Final       Today   Subjective    Gwenette Greet today has no new complaints, requesting to be discharged home, threatening to leave against medical advice if not discharged home today , ambulating with therapist with a walker         Patient has been seen and examined prior to discharge   Objective   Blood pressure 108/65, pulse 68, temperature 97.6 F (36.4 C), temperature source Oral, resp. rate 18, height 5\' 8"  (1.727 m), weight 65 kg (143 lb 4.8 oz), SpO2 100 %.   Intake/Output Summary (Last 24 hours) at 04/16/2018 1320 Last data filed at 04/16/2018 1044 Gross per 24 hour  Intake 605 ml  Output 1251 ml  Net -646 ml    Exam Gen:- Awake Alert, cachectic appearing, in no apparent distress (Kyphotic) HEENT:- [place].AT, No sclera icterus Nose- [place] 2 L/min Neck-Supple Neck,No JVD,.  Lungs-few scattered bibasilar rales, improved air movement CV- S1, S2 normal , 3/6 SM  Abd-  +ve B.Sounds, Abd Soft, No tenderness,    Extremity/Skin:-Mostly resolved edema,  good pulses Psych-affect is appropriate, oriented x3 Neuro-no new focal deficits, no tremors   Data Review   CBC w Diff:  Lab Results  Component Value Date   WBC 7.0 04/15/2018   HGB 8.8 (L) 04/15/2018   HGB 11.0 (L) 05/05/2017   HCT 26.5 (L) 04/15/2018   HCT 26.8 (L) 04/11/2018    HCT 32.7 (L) 05/05/2017   PLT 259 04/15/2018   PLT 215 05/05/2017   LYMPHOPCT 12 01/05/2018   LYMPHOPCT 8.5 (L) 05/05/2017   MONOPCT 11 01/05/2018   MONOPCT 11.0 05/05/2017   EOSPCT 12 01/05/2018   EOSPCT 8.8 (H) 05/05/2017   BASOPCT 2 01/05/2018   BASOPCT 1.1 05/05/2017    CMP:  Lab Results  Component Value Date   NA 127 (L) 04/16/2018   NA 136 12/26/2013   K 4.2 04/16/2018   K 3.3 (L) 12/26/2013   CL 88 (L) 04/16/2018   CO2 31 04/16/2018   CO2 25 12/26/2013   BUN 22 (H) 04/16/2018   BUN 13.0 12/26/2013   CREATININE 0.97 04/16/2018   CREATININE 0.80 03/09/2014   CREATININE 0.9 12/26/2013   PROT 4.9 (L) 04/13/2018   PROT 5.6 (L) 12/26/2013   ALBUMIN 2.6 (L) 04/16/2018   ALBUMIN 2.9 (L) 12/26/2013   BILITOT 0.7 04/13/2018   BILITOT 0.53 12/26/2013   ALKPHOS 100 04/13/2018   ALKPHOS 68 12/26/2013   AST 22 04/13/2018   AST 25 12/26/2013   ALT 9 (L) 04/13/2018   ALT 12 12/26/2013     Total Discharge time is about 33 minutes  Roxan Hockey M.D on 04/16/2018 at 1:20 PM  [facility]   Office  [phone]  Voice Recognition Viviann Spare dictation system was used to create this note, attempts have been made to correct errors. Please contact the author with questions and/or clarifications.

## 2018-04-16 NOTE — Consult Note (Signed)
Referral from care management for [facility] follow up as patient is agreeing to post hospital follow up in order to go home.  Will have [place] social worker to follow up on resources and assessment for other needs.  Patient agreed to follow up for services. Spoke with the patient's daughter Yves Dill, [phone].  HIPAA verified.  Margarita Grizzle states, "I had a tough time getting dad in the house.  I don't see how he can make it.  It is very hard to get him to understand that he needs more care than what me and my brother can provide right now."  Patient was set up with home health.  Daughter states her dad's house is very hot, "the way he likes it." She agrees to receive follow up calls but declines having automated calls to her phone.  States, "I work all day and 7 days a week."  She states, "my dad, can not hear the phone or knocks at the door so it's best to contact me to let folks in.  You could stay 10 feet away from him and he can't hear you."    For questions, please contact:  Natividad Brood, RN BSN Trainer Hospital Liaison  [phone] business mobile phone Toll free office [phone]

## 2018-04-16 NOTE — Progress Notes (Signed)
Physical Therapy Treatment Patient Details Name: Kyle Armstrong MRN: [id number] DOB: [birth year] Today's Date: 04/16/2018    History of Present Illness Patient is an 84 year old male with history of diastolic congestive heart failure, chronic hyponatremia, moderate aortic stenosis and mild aortic regurgitation, sarcoidosis of the lung, hyperlipidemia, pernicious anemia, hypothyroidism, history of stomach cancer status post radiation therapy and chemotherapy, SVTs and arthritis.  The patient was admitted with worsening SOB, possibly acute on chronic diastolic congestive heart failure, significant left-sided pleural effusion.    PT Comments    Patient with improved bed mobility and transfers. Ambulated 100' becoming mildly dyspneic towards end of walking on 2L, unable to obtain SpO2 via pulse ox this visit. No family present to discuss safety consideration and patient underestimates his risk of falling. Refusing SNF, however agreed to HHPT this visit. Patient requires supervision for OOB mobilty to maximize safety.     Follow Up Recommendations  Supervision/Assistance - 24 hour;SNF  (pt is refusing SNF which is our recommendation, but agreed to HHPT this visit).      Equipment Recommendations  None recommended by PT    Recommendations for Other Services       Precautions / Restrictions Precautions Precautions: Fall Restrictions Weight Bearing Restrictions: No    Mobility  Bed Mobility Overal bed mobility: Needs Assistance Bed Mobility: Supine to Sit     Supine to sit: Supervision        Transfers Overall transfer level: Needs assistance Equipment used: Rolling walker (2 wheeled) Transfers: Sit to/from Stand Sit to Stand: Min guard            Ambulation/Gait Ambulation/Gait assistance: Min guard Ambulation Distance (Feet): 100 Feet Assistive device: Rolling walker (2 wheeled) Gait Pattern/deviations: Step-through pattern;Decreased stride length;Trunk flexed Gait  velocity: decreased   General Gait Details: patient ambulates with trunk flexed due to kyophosis, looking down at floor. Cues for proxmity to RW and to look up patient unable to demonstrate safely.    Stairs             Wheelchair Mobility    Modified Rankin (Stroke Patients Only)       Balance Overall balance assessment: Needs assistance Sitting-balance support: Feet supported Sitting balance-Leahy Scale: Fair     Standing balance support: Bilateral upper extremity supported;During functional activity Standing balance-Leahy Scale: Poor Standing balance comment: needs RW                            Cognition Arousal/Alertness: Awake/alert Behavior During Therapy: WFL for tasks assessed/performed Overall Cognitive Status: Within Functional Limits for tasks assessed                                        Exercises      General Comments        Pertinent Vitals/Pain      Home Living                      Prior Function            PT Goals (current goals can now be found in the care plan section) Acute Rehab PT Goals PT Goal Formulation: With patient Time For Goal Achievement: 04/27/18 Potential to Achieve Goals: Good Progress towards PT goals: Progressing toward goals    Frequency    Min 3X/week  PT Plan Current plan remains appropriate    Co-evaluation              AM-PAC PT "6 Clicks" Daily Activity  Outcome Measure  Difficulty turning over in bed (including adjusting bedclothes, sheets and blankets)?: Unable Difficulty moving from lying on back to sitting on the side of the bed? : Unable Difficulty sitting down on and standing up from a chair with arms (e.g., wheelchair, bedside commode, etc,.)?: A Little Help needed moving to and from a bed to chair (including a wheelchair)?: A Little Help needed walking in hospital room?: A Little Help needed climbing 3-5 steps with a railing? : A Lot 6  Click Score: 13    End of Session Equipment Utilized During Treatment: Gait belt;Oxygen Activity Tolerance: Patient limited by fatigue Patient left: with call bell/phone within reach;in chair;with chair alarm set Nurse Communication: Mobility status PT Visit Diagnosis: Unsteadiness on feet (R26.81)     Time: 0802-2336 PT Time Calculation (min) (ACUTE ONLY): 28 min  Charges:  $Gait Training: 8-22 mins                    G Codes:      Reinaldo Berber, PT, DPT Acute Rehab Services Pager: [phone]     Reinaldo Berber 04/16/2018, 9:03 AM

## 2018-04-18 LAB — CULTURE, BODY FLUID W GRAM STAIN -BOTTLE

## 2018-04-18 LAB — CULTURE, BODY FLUID-BOTTLE: CULTURE: NO GROWTH

## 2018-04-20 ENCOUNTER — Other Ambulatory Visit: Payer: Self-pay | Admitting: *Deleted

## 2018-04-20 NOTE — Patient Outreach (Signed)
[facility]) Care Management  04/20/2018  GABRYEL FILES [birth year] [id number]   CSW made initial contact with pt's daughter, Margarita Grizzle, and pt's identity was confirmed. Pt is very hard of hearing and per daughter cannot talk on the phone because of this, Per daughter, pt "is stubborn" and declined SNF from hospital and came home. She reports pt's son lives on the property in a camper and works during the day. Daughter also works and lives on the property with her business on the property too. She indicates she and her brother are able to check in and out and that pt seems to be doing ok. She feels his activity as declined and that he is supposed to have [facility] services but they have not heard from they yet.  She has not been able to get  her father to use the life line alert button she purchased but he has a way of communicating via  [place] phone with her brother on the property.  Pt was admitted to hospital with CHF per daughters report and had "2 liters off one lung".   She is uncertain of any needs at this time, however, CSW will plan f/u call next week. CSW provided contact # for reaching [facility] CSW.   Eduard Clos, MSW, [place] Worker  [place] [phone]

## 2018-04-21 DIAGNOSIS — H9190 Unspecified hearing loss, unspecified ear: Secondary | ICD-10-CM | POA: Diagnosis not present

## 2018-04-21 DIAGNOSIS — E785 Hyperlipidemia, unspecified: Secondary | ICD-10-CM | POA: Diagnosis not present

## 2018-04-21 DIAGNOSIS — D510 Vitamin B12 deficiency anemia due to intrinsic factor deficiency: Secondary | ICD-10-CM | POA: Diagnosis not present

## 2018-04-21 DIAGNOSIS — I350 Nonrheumatic aortic (valve) stenosis: Secondary | ICD-10-CM | POA: Diagnosis not present

## 2018-04-21 DIAGNOSIS — N400 Enlarged prostate without lower urinary tract symptoms: Secondary | ICD-10-CM | POA: Diagnosis not present

## 2018-04-21 DIAGNOSIS — M1990 Unspecified osteoarthritis, unspecified site: Secondary | ICD-10-CM | POA: Diagnosis not present

## 2018-04-21 DIAGNOSIS — I5032 Chronic diastolic (congestive) heart failure: Secondary | ICD-10-CM | POA: Diagnosis not present

## 2018-04-21 DIAGNOSIS — I5020 Unspecified systolic (congestive) heart failure: Secondary | ICD-10-CM | POA: Diagnosis not present

## 2018-04-21 DIAGNOSIS — E871 Hypo-osmolality and hyponatremia: Secondary | ICD-10-CM | POA: Diagnosis not present

## 2018-04-22 DIAGNOSIS — I350 Nonrheumatic aortic (valve) stenosis: Secondary | ICD-10-CM | POA: Diagnosis not present

## 2018-04-22 DIAGNOSIS — H9190 Unspecified hearing loss, unspecified ear: Secondary | ICD-10-CM | POA: Diagnosis not present

## 2018-04-22 DIAGNOSIS — M1990 Unspecified osteoarthritis, unspecified site: Secondary | ICD-10-CM | POA: Diagnosis not present

## 2018-04-22 DIAGNOSIS — E785 Hyperlipidemia, unspecified: Secondary | ICD-10-CM | POA: Diagnosis not present

## 2018-04-22 DIAGNOSIS — I5032 Chronic diastolic (congestive) heart failure: Secondary | ICD-10-CM | POA: Diagnosis not present

## 2018-04-22 DIAGNOSIS — E871 Hypo-osmolality and hyponatremia: Secondary | ICD-10-CM | POA: Diagnosis not present

## 2018-04-22 DIAGNOSIS — N400 Enlarged prostate without lower urinary tract symptoms: Secondary | ICD-10-CM | POA: Diagnosis not present

## 2018-04-22 DIAGNOSIS — D510 Vitamin B12 deficiency anemia due to intrinsic factor deficiency: Secondary | ICD-10-CM | POA: Diagnosis not present

## 2018-04-22 DIAGNOSIS — I5020 Unspecified systolic (congestive) heart failure: Secondary | ICD-10-CM | POA: Diagnosis not present

## 2018-04-23 ENCOUNTER — Telehealth: Payer: Self-pay | Admitting: Cardiology

## 2018-04-23 ENCOUNTER — Ambulatory Visit: Payer: Self-pay | Admitting: *Deleted

## 2018-04-23 DIAGNOSIS — I5032 Chronic diastolic (congestive) heart failure: Secondary | ICD-10-CM | POA: Diagnosis not present

## 2018-04-23 DIAGNOSIS — I350 Nonrheumatic aortic (valve) stenosis: Secondary | ICD-10-CM | POA: Diagnosis not present

## 2018-04-23 DIAGNOSIS — E785 Hyperlipidemia, unspecified: Secondary | ICD-10-CM | POA: Diagnosis not present

## 2018-04-23 DIAGNOSIS — Z9981 Dependence on supplemental oxygen: Secondary | ICD-10-CM | POA: Diagnosis not present

## 2018-04-23 DIAGNOSIS — H9190 Unspecified hearing loss, unspecified ear: Secondary | ICD-10-CM | POA: Diagnosis not present

## 2018-04-23 DIAGNOSIS — I5020 Unspecified systolic (congestive) heart failure: Secondary | ICD-10-CM | POA: Diagnosis not present

## 2018-04-23 DIAGNOSIS — R5381 Other malaise: Secondary | ICD-10-CM | POA: Diagnosis not present

## 2018-04-23 DIAGNOSIS — E538 Deficiency of other specified B group vitamins: Secondary | ICD-10-CM | POA: Diagnosis not present

## 2018-04-23 DIAGNOSIS — D510 Vitamin B12 deficiency anemia due to intrinsic factor deficiency: Secondary | ICD-10-CM | POA: Diagnosis not present

## 2018-04-23 DIAGNOSIS — I509 Heart failure, unspecified: Secondary | ICD-10-CM | POA: Diagnosis not present

## 2018-04-23 DIAGNOSIS — M1990 Unspecified osteoarthritis, unspecified site: Secondary | ICD-10-CM | POA: Diagnosis not present

## 2018-04-23 DIAGNOSIS — Z111 Encounter for screening for respiratory tuberculosis: Secondary | ICD-10-CM | POA: Diagnosis not present

## 2018-04-23 DIAGNOSIS — E782 Mixed hyperlipidemia: Secondary | ICD-10-CM | POA: Diagnosis not present

## 2018-04-23 DIAGNOSIS — N400 Enlarged prostate without lower urinary tract symptoms: Secondary | ICD-10-CM | POA: Diagnosis not present

## 2018-04-23 DIAGNOSIS — D860 Sarcoidosis of lung: Secondary | ICD-10-CM | POA: Diagnosis not present

## 2018-04-23 DIAGNOSIS — E871 Hypo-osmolality and hyponatremia: Secondary | ICD-10-CM | POA: Diagnosis not present

## 2018-04-23 DIAGNOSIS — I2510 Atherosclerotic heart disease of native coronary artery without angina pectoris: Secondary | ICD-10-CM | POA: Diagnosis not present

## 2018-04-23 MED ORDER — METOPROLOL TARTRATE 25 MG PO TABS: 12.5000 mg | ORAL_TABLET | Freq: Every day | ORAL | 2 refills | Status: DC

## 2018-04-23 NOTE — Telephone Encounter (Signed)
Attempted to contact patient, but there was no answer. Left message for patient to call back.

## 2018-04-23 NOTE — Telephone Encounter (Signed)
Late entry:  Got in contact with Tillie Rung, PT at [facility] to discuss message below. She states that the patient's BP was 84/50 earlier when she was there and that he had only been drinking 12 oz of water a day. She did not have any additional vitals to offer. She states that the patient's daughter was suppose to go see his PCP Dr. Harrington Challenger and they were going to work on getting him placed at the [facility] facility.   Called patient's daughter to follow-up and see if the patient was seen by PCP. Daughter stated that they had just got back from seeing the PCP. She stated that the patient's BP was still 80/50. She states that the patient was very tired and weak and was SOB with walking. I asked if PCP changed any meds, gave instructions to increase fluids, or ran any tests. The daughter stated that the patient was given a TB skin test and was instructed to have it read on Monday but there were no other instructions given. The patient takes lasix 20 mg QD and metoprolol 12.5 mg BID. I told the daughter that I was going to reach out to the PCP's office to find out what their instruction was since the patient's BP was still low.   Attempted to call the PCP's office several times and could never get through. It was then 5:00 PM. Discussed patient with DOD. Per DOD, patient to decrease metoprolol to 12.5 mg QD, increase fluids, monitor urine for color and if dark push more fluids, and schedule appointment with DOD on Monday. Patient to go to the ER over the weekend if symptoms worsen.   Called the daughter back and instructed for the patient to increase fluids, hold PM dose of metoprolol tonight and to only take metoprolol 12.5 mg once a day. Instructed for the patient to monitor the color of his urine and increase fluids if urine is dark. Made daughter aware that appointment had been made for patient to see DOD Dr. Meda Coffee on Monday at  9:40 AM. Daughter states that she cannot bring patient to an appointment on Monday and  that no one can bring him until Wednesday. Appointment made for patient to come in on Wednesday 04/28/18 at 2:20 PM to see Dr. Irish Lack. Instructed for the patient to go to the ER over the weekend if symptoms worsen. Daughter verbalized understanding and thanked me for the call.

## 2018-04-23 NOTE — Telephone Encounter (Signed)
New Message   Tillie Rung a PT at [facility] is calling to inform the doctor that his bp is 84/50, he is only drinking 12 oz a day, wants to know if he is on fluid restrictions and if so can they give him specific instructions because she does not think he is taking enough. States he is going to see Dr. Harrington Challenger today his  pcp and that he is also going to be going to the [facility] facility.

## 2018-04-23 NOTE — Telephone Encounter (Signed)
Returned call to [facility] at [facility] but there was no answer. Left message for her to call back. Will attempt to reach out to patient.

## 2018-04-24 ENCOUNTER — Other Ambulatory Visit: Payer: Self-pay | Admitting: *Deleted

## 2018-04-24 NOTE — Patient Outreach (Signed)
[facility]) Care Management  04/24/2018  NAHEEM MOSCO Mar 05, 1930 [id number]   CSW attempted phone outreach to pt's daughter for follow up on 04/23/2018 . CSW left voice message and will outreach again in the next 5-7 days if no return call received.    Eduard Clos, MSW, [place] Worker  [place] [phone]

## 2018-04-26 ENCOUNTER — Ambulatory Visit: Payer: Medicare HMO | Admitting: Cardiology

## 2018-04-26 DIAGNOSIS — I5032 Chronic diastolic (congestive) heart failure: Secondary | ICD-10-CM | POA: Diagnosis not present

## 2018-04-26 DIAGNOSIS — I350 Nonrheumatic aortic (valve) stenosis: Secondary | ICD-10-CM | POA: Diagnosis not present

## 2018-04-26 DIAGNOSIS — E871 Hypo-osmolality and hyponatremia: Secondary | ICD-10-CM | POA: Diagnosis not present

## 2018-04-26 DIAGNOSIS — E785 Hyperlipidemia, unspecified: Secondary | ICD-10-CM | POA: Diagnosis not present

## 2018-04-26 DIAGNOSIS — I5020 Unspecified systolic (congestive) heart failure: Secondary | ICD-10-CM | POA: Diagnosis not present

## 2018-04-26 DIAGNOSIS — D510 Vitamin B12 deficiency anemia due to intrinsic factor deficiency: Secondary | ICD-10-CM | POA: Diagnosis not present

## 2018-04-26 DIAGNOSIS — M1990 Unspecified osteoarthritis, unspecified site: Secondary | ICD-10-CM | POA: Diagnosis not present

## 2018-04-26 DIAGNOSIS — H9190 Unspecified hearing loss, unspecified ear: Secondary | ICD-10-CM | POA: Diagnosis not present

## 2018-04-26 DIAGNOSIS — N400 Enlarged prostate without lower urinary tract symptoms: Secondary | ICD-10-CM | POA: Diagnosis not present

## 2018-04-27 ENCOUNTER — Telehealth: Payer: Self-pay | Admitting: Interventional Cardiology

## 2018-04-27 NOTE — Telephone Encounter (Signed)
Traci from [facility] calling back and states that she needs orders for this patient. She states that the patient was d/c'd from the hospital on 04/16/18. She states that the patient will need nursing and PT orders. Made Traci aware that this is typically managed by PCP. She states that Dr. Hassell Done name is on the referral. Made her aware that this is typically managed by PCP, but I would check to see if he would like to manage it and call her back and let her know. She verbalized understanding.

## 2018-04-27 NOTE — Telephone Encounter (Signed)
New Message:        Kyle Armstrong is needing admission orders for this pt. Pt was just admitted this week she states.

## 2018-04-27 NOTE — Telephone Encounter (Signed)
Left message for Traci to call back.

## 2018-04-28 ENCOUNTER — Other Ambulatory Visit: Payer: Self-pay | Admitting: *Deleted

## 2018-04-28 ENCOUNTER — Ambulatory Visit: Payer: Medicare HMO | Admitting: Interventional Cardiology

## 2018-04-28 DIAGNOSIS — E871 Hypo-osmolality and hyponatremia: Secondary | ICD-10-CM | POA: Diagnosis not present

## 2018-04-28 DIAGNOSIS — I5020 Unspecified systolic (congestive) heart failure: Secondary | ICD-10-CM | POA: Diagnosis not present

## 2018-04-28 DIAGNOSIS — I350 Nonrheumatic aortic (valve) stenosis: Secondary | ICD-10-CM | POA: Diagnosis not present

## 2018-04-28 DIAGNOSIS — E785 Hyperlipidemia, unspecified: Secondary | ICD-10-CM | POA: Diagnosis not present

## 2018-04-28 DIAGNOSIS — D510 Vitamin B12 deficiency anemia due to intrinsic factor deficiency: Secondary | ICD-10-CM | POA: Diagnosis not present

## 2018-04-28 DIAGNOSIS — M1990 Unspecified osteoarthritis, unspecified site: Secondary | ICD-10-CM | POA: Diagnosis not present

## 2018-04-28 DIAGNOSIS — I5032 Chronic diastolic (congestive) heart failure: Secondary | ICD-10-CM | POA: Diagnosis not present

## 2018-04-28 DIAGNOSIS — H9190 Unspecified hearing loss, unspecified ear: Secondary | ICD-10-CM | POA: Diagnosis not present

## 2018-04-28 DIAGNOSIS — N400 Enlarged prostate without lower urinary tract symptoms: Secondary | ICD-10-CM | POA: Diagnosis not present

## 2018-04-28 NOTE — Telephone Encounter (Signed)
Called and made Traci from [facility] aware that Dr. Irish Lack is going to defer to PCP to manage those orders. Traci verbalized understanding and thanked me for the call.

## 2018-04-28 NOTE — Patient Outreach (Signed)
[place] [facility]) Care Management  04/28/2018  Kyle Armstrong [birth year] [id number]   CSW attempted to reach pt's daughter again today for follow up and further screening without success. CSW will send unsuccessful outreach letter and try again in 2-3 business days.   Eduard Clos, MSW, [place] Worker  [place] [phone]

## 2018-04-29 ENCOUNTER — Ambulatory Visit: Payer: Self-pay | Admitting: *Deleted

## 2018-04-30 ENCOUNTER — Other Ambulatory Visit: Payer: Self-pay | Admitting: *Deleted

## 2018-04-30 NOTE — Patient Outreach (Signed)
[place] [facility]) Care Management  04/30/2018  Kyle Armstrong 1930-02-23 [id number]   CSW has been unable to reach pt/daughter by phone. CSW has sent unsuccessful outreach letter and will try again next week.   Eduard Clos, MSW, [place] Worker  [place] [phone]

## 2018-05-03 DIAGNOSIS — E039 Hypothyroidism, unspecified: Secondary | ICD-10-CM | POA: Diagnosis present

## 2018-05-03 DIAGNOSIS — Z515 Encounter for palliative care: Secondary | ICD-10-CM | POA: Diagnosis not present

## 2018-05-03 DIAGNOSIS — K921 Melena: Secondary | ICD-10-CM | POA: Diagnosis not present

## 2018-05-03 DIAGNOSIS — R2681 Unsteadiness on feet: Secondary | ICD-10-CM | POA: Diagnosis not present

## 2018-05-03 DIAGNOSIS — Z85028 Personal history of other malignant neoplasm of stomach: Secondary | ICD-10-CM | POA: Diagnosis not present

## 2018-05-03 DIAGNOSIS — R278 Other lack of coordination: Secondary | ICD-10-CM | POA: Diagnosis not present

## 2018-05-03 DIAGNOSIS — C3490 Malignant neoplasm of unspecified part of unspecified bronchus or lung: Secondary | ICD-10-CM | POA: Diagnosis not present

## 2018-05-03 DIAGNOSIS — Z79899 Other long term (current) drug therapy: Secondary | ICD-10-CM | POA: Diagnosis not present

## 2018-05-03 DIAGNOSIS — M549 Dorsalgia, unspecified: Secondary | ICD-10-CM | POA: Diagnosis present

## 2018-05-03 DIAGNOSIS — R4182 Altered mental status, unspecified: Secondary | ICD-10-CM | POA: Diagnosis not present

## 2018-05-03 DIAGNOSIS — I5033 Acute on chronic diastolic (congestive) heart failure: Secondary | ICD-10-CM | POA: Diagnosis not present

## 2018-05-03 DIAGNOSIS — C163 Malignant neoplasm of pyloric antrum: Secondary | ICD-10-CM | POA: Diagnosis not present

## 2018-05-03 DIAGNOSIS — C3492 Malignant neoplasm of unspecified part of left bronchus or lung: Secondary | ICD-10-CM | POA: Diagnosis not present

## 2018-05-03 DIAGNOSIS — Z9889 Other specified postprocedural states: Secondary | ICD-10-CM | POA: Diagnosis not present

## 2018-05-03 DIAGNOSIS — J942 Hemothorax: Secondary | ICD-10-CM | POA: Diagnosis not present

## 2018-05-03 DIAGNOSIS — J9621 Acute and chronic respiratory failure with hypoxia: Secondary | ICD-10-CM | POA: Diagnosis not present

## 2018-05-03 DIAGNOSIS — J90 Pleural effusion, not elsewhere classified: Secondary | ICD-10-CM | POA: Diagnosis not present

## 2018-05-03 DIAGNOSIS — E785 Hyperlipidemia, unspecified: Secondary | ICD-10-CM | POA: Diagnosis present

## 2018-05-03 DIAGNOSIS — D62 Acute posthemorrhagic anemia: Secondary | ICD-10-CM | POA: Diagnosis not present

## 2018-05-03 DIAGNOSIS — J9601 Acute respiratory failure with hypoxia: Secondary | ICD-10-CM | POA: Diagnosis not present

## 2018-05-03 DIAGNOSIS — I350 Nonrheumatic aortic (valve) stenosis: Secondary | ICD-10-CM | POA: Diagnosis not present

## 2018-05-03 DIAGNOSIS — R918 Other nonspecific abnormal finding of lung field: Secondary | ICD-10-CM | POA: Diagnosis not present

## 2018-05-03 DIAGNOSIS — R0902 Hypoxemia: Secondary | ICD-10-CM | POA: Diagnosis not present

## 2018-05-03 DIAGNOSIS — N400 Enlarged prostate without lower urinary tract symptoms: Secondary | ICD-10-CM | POA: Diagnosis present

## 2018-05-03 DIAGNOSIS — Z86010 Personal history of colonic polyps: Secondary | ICD-10-CM | POA: Diagnosis not present

## 2018-05-03 DIAGNOSIS — Z87891 Personal history of nicotine dependence: Secondary | ICD-10-CM | POA: Diagnosis not present

## 2018-05-03 DIAGNOSIS — J918 Pleural effusion in other conditions classified elsewhere: Secondary | ICD-10-CM | POA: Diagnosis not present

## 2018-05-03 DIAGNOSIS — G8929 Other chronic pain: Secondary | ICD-10-CM | POA: Diagnosis not present

## 2018-05-03 DIAGNOSIS — M6281 Muscle weakness (generalized): Secondary | ICD-10-CM | POA: Diagnosis not present

## 2018-05-03 DIAGNOSIS — R0600 Dyspnea, unspecified: Secondary | ICD-10-CM | POA: Diagnosis not present

## 2018-05-03 DIAGNOSIS — J948 Other specified pleural conditions: Secondary | ICD-10-CM | POA: Diagnosis not present

## 2018-05-03 DIAGNOSIS — R599 Enlarged lymph nodes, unspecified: Secondary | ICD-10-CM | POA: Diagnosis present

## 2018-05-03 DIAGNOSIS — Z66 Do not resuscitate: Secondary | ICD-10-CM | POA: Diagnosis not present

## 2018-05-03 DIAGNOSIS — I4891 Unspecified atrial fibrillation: Secondary | ICD-10-CM | POA: Diagnosis not present

## 2018-05-03 DIAGNOSIS — J910 Malignant pleural effusion: Secondary | ICD-10-CM | POA: Diagnosis not present

## 2018-05-03 DIAGNOSIS — D630 Anemia in neoplastic disease: Secondary | ICD-10-CM | POA: Diagnosis not present

## 2018-05-03 DIAGNOSIS — Z7189 Other specified counseling: Secondary | ICD-10-CM | POA: Diagnosis not present

## 2018-05-03 DIAGNOSIS — G9341 Metabolic encephalopathy: Secondary | ICD-10-CM | POA: Diagnosis not present

## 2018-05-03 DIAGNOSIS — R591 Generalized enlarged lymph nodes: Secondary | ICD-10-CM | POA: Diagnosis not present

## 2018-05-03 DIAGNOSIS — E538 Deficiency of other specified B group vitamins: Secondary | ICD-10-CM | POA: Diagnosis not present

## 2018-05-03 DIAGNOSIS — M6389 Disorders of muscle in diseases classified elsewhere, multiple sites: Secondary | ICD-10-CM | POA: Diagnosis not present

## 2018-05-03 DIAGNOSIS — D869 Sarcoidosis, unspecified: Secondary | ICD-10-CM | POA: Diagnosis not present

## 2018-05-03 DIAGNOSIS — I5032 Chronic diastolic (congestive) heart failure: Secondary | ICD-10-CM | POA: Diagnosis not present

## 2018-05-03 DIAGNOSIS — Z741 Need for assistance with personal care: Secondary | ICD-10-CM | POA: Diagnosis not present

## 2018-05-03 DIAGNOSIS — R05 Cough: Secondary | ICD-10-CM | POA: Diagnosis not present

## 2018-05-03 DIAGNOSIS — I959 Hypotension, unspecified: Secondary | ICD-10-CM | POA: Diagnosis not present

## 2018-05-03 DIAGNOSIS — Z880 Allergy status to penicillin: Secondary | ICD-10-CM | POA: Diagnosis not present

## 2018-05-03 DIAGNOSIS — S2241XA Multiple fractures of ribs, right side, initial encounter for closed fracture: Secondary | ICD-10-CM | POA: Diagnosis not present

## 2018-05-03 DIAGNOSIS — R0602 Shortness of breath: Secondary | ICD-10-CM | POA: Diagnosis not present

## 2018-05-03 DIAGNOSIS — H9190 Unspecified hearing loss, unspecified ear: Secondary | ICD-10-CM | POA: Diagnosis present

## 2018-05-03 DIAGNOSIS — R590 Localized enlarged lymph nodes: Secondary | ICD-10-CM | POA: Diagnosis not present

## 2018-05-03 DIAGNOSIS — R41841 Cognitive communication deficit: Secondary | ICD-10-CM | POA: Diagnosis not present

## 2018-05-03 DIAGNOSIS — J189 Pneumonia, unspecified organism: Secondary | ICD-10-CM | POA: Diagnosis not present

## 2018-05-03 DIAGNOSIS — I110 Hypertensive heart disease with heart failure: Secondary | ICD-10-CM | POA: Diagnosis present

## 2018-05-03 DIAGNOSIS — I509 Heart failure, unspecified: Secondary | ICD-10-CM | POA: Diagnosis not present

## 2018-05-03 DIAGNOSIS — Z888 Allergy status to other drugs, medicaments and biological substances status: Secondary | ICD-10-CM | POA: Diagnosis not present

## 2018-05-06 DIAGNOSIS — E538 Deficiency of other specified B group vitamins: Secondary | ICD-10-CM | POA: Diagnosis not present

## 2018-05-06 DIAGNOSIS — I5033 Acute on chronic diastolic (congestive) heart failure: Secondary | ICD-10-CM | POA: Diagnosis not present

## 2018-05-06 DIAGNOSIS — I350 Nonrheumatic aortic (valve) stenosis: Secondary | ICD-10-CM | POA: Diagnosis not present

## 2018-05-06 DIAGNOSIS — R0902 Hypoxemia: Secondary | ICD-10-CM | POA: Diagnosis not present

## 2018-05-06 DIAGNOSIS — K921 Melena: Secondary | ICD-10-CM | POA: Diagnosis not present

## 2018-05-07 ENCOUNTER — Other Ambulatory Visit: Payer: Self-pay | Admitting: *Deleted

## 2018-05-07 ENCOUNTER — Encounter: Payer: Self-pay | Admitting: *Deleted

## 2018-05-07 ENCOUNTER — Ambulatory Visit: Payer: Self-pay | Admitting: *Deleted

## 2018-05-07 NOTE — Patient Outreach (Signed)
[place] [facility]) Care Management  05/07/2018  [name] 08-05-1930 [id number]   CSW has been unable to make contact with pt or family since initial phone outreach was made with daughter. CSW will plan case closure at this time. CSW will advise [facility] team and PCP.   Eduard Clos, MSW, [place] Worker  [place] [phone]

## 2018-05-17 ENCOUNTER — Emergency Department (HOSPITAL_COMMUNITY): Payer: Medicare HMO

## 2018-05-17 ENCOUNTER — Inpatient Hospital Stay (HOSPITAL_COMMUNITY): Payer: Medicare HMO

## 2018-05-17 ENCOUNTER — Other Ambulatory Visit: Payer: Self-pay

## 2018-05-17 ENCOUNTER — Encounter (HOSPITAL_COMMUNITY): Payer: Self-pay | Admitting: Emergency Medicine

## 2018-05-17 ENCOUNTER — Inpatient Hospital Stay (HOSPITAL_COMMUNITY)
Admission: EM | Admit: 2018-05-17 | Discharge: 2018-05-26 | DRG: 180 | Disposition: A | Discharge status: Hospice | Payer: Medicare HMO | Attending: Internal Medicine | Admitting: Internal Medicine

## 2018-05-17 DIAGNOSIS — C3490 Malignant neoplasm of unspecified part of unspecified bronchus or lung: Secondary | ICD-10-CM

## 2018-05-17 DIAGNOSIS — I4891 Unspecified atrial fibrillation: Secondary | ICD-10-CM | POA: Diagnosis not present

## 2018-05-17 DIAGNOSIS — N400 Enlarged prostate without lower urinary tract symptoms: Secondary | ICD-10-CM | POA: Diagnosis present

## 2018-05-17 DIAGNOSIS — Z7189 Other specified counseling: Secondary | ICD-10-CM

## 2018-05-17 DIAGNOSIS — H9190 Unspecified hearing loss, unspecified ear: Secondary | ICD-10-CM | POA: Diagnosis present

## 2018-05-17 DIAGNOSIS — R4182 Altered mental status, unspecified: Secondary | ICD-10-CM | POA: Diagnosis not present

## 2018-05-17 DIAGNOSIS — E785 Hyperlipidemia, unspecified: Secondary | ICD-10-CM | POA: Diagnosis present

## 2018-05-17 DIAGNOSIS — G9341 Metabolic encephalopathy: Secondary | ICD-10-CM | POA: Diagnosis present

## 2018-05-17 DIAGNOSIS — Z515 Encounter for palliative care: Secondary | ICD-10-CM | POA: Diagnosis not present

## 2018-05-17 DIAGNOSIS — J90 Pleural effusion, not elsewhere classified: Secondary | ICD-10-CM

## 2018-05-17 DIAGNOSIS — E039 Hypothyroidism, unspecified: Secondary | ICD-10-CM | POA: Diagnosis present

## 2018-05-17 DIAGNOSIS — Z9889 Other specified postprocedural states: Secondary | ICD-10-CM

## 2018-05-17 DIAGNOSIS — D869 Sarcoidosis, unspecified: Secondary | ICD-10-CM | POA: Diagnosis not present

## 2018-05-17 DIAGNOSIS — D62 Acute posthemorrhagic anemia: Secondary | ICD-10-CM | POA: Diagnosis not present

## 2018-05-17 DIAGNOSIS — G8929 Other chronic pain: Secondary | ICD-10-CM | POA: Diagnosis not present

## 2018-05-17 DIAGNOSIS — Z87891 Personal history of nicotine dependence: Secondary | ICD-10-CM

## 2018-05-17 DIAGNOSIS — J910 Malignant pleural effusion: Secondary | ICD-10-CM | POA: Diagnosis present

## 2018-05-17 DIAGNOSIS — Z880 Allergy status to penicillin: Secondary | ICD-10-CM | POA: Diagnosis not present

## 2018-05-17 DIAGNOSIS — I110 Hypertensive heart disease with heart failure: Secondary | ICD-10-CM | POA: Diagnosis present

## 2018-05-17 DIAGNOSIS — J942 Hemothorax: Secondary | ICD-10-CM | POA: Diagnosis present

## 2018-05-17 DIAGNOSIS — Z79899 Other long term (current) drug therapy: Secondary | ICD-10-CM | POA: Diagnosis not present

## 2018-05-17 DIAGNOSIS — J9621 Acute and chronic respiratory failure with hypoxia: Secondary | ICD-10-CM | POA: Diagnosis present

## 2018-05-17 DIAGNOSIS — J9601 Acute respiratory failure with hypoxia: Secondary | ICD-10-CM | POA: Diagnosis not present

## 2018-05-17 DIAGNOSIS — R0902 Hypoxemia: Secondary | ICD-10-CM | POA: Diagnosis not present

## 2018-05-17 DIAGNOSIS — R918 Other nonspecific abnormal finding of lung field: Secondary | ICD-10-CM

## 2018-05-17 DIAGNOSIS — I959 Hypotension, unspecified: Secondary | ICD-10-CM | POA: Diagnosis not present

## 2018-05-17 DIAGNOSIS — C3492 Malignant neoplasm of unspecified part of left bronchus or lung: Secondary | ICD-10-CM | POA: Diagnosis not present

## 2018-05-17 DIAGNOSIS — R590 Localized enlarged lymph nodes: Secondary | ICD-10-CM | POA: Diagnosis not present

## 2018-05-17 DIAGNOSIS — R0600 Dyspnea, unspecified: Secondary | ICD-10-CM | POA: Diagnosis not present

## 2018-05-17 DIAGNOSIS — R0602 Shortness of breath: Secondary | ICD-10-CM | POA: Diagnosis not present

## 2018-05-17 DIAGNOSIS — C163 Malignant neoplasm of pyloric antrum: Secondary | ICD-10-CM | POA: Diagnosis not present

## 2018-05-17 DIAGNOSIS — D630 Anemia in neoplastic disease: Secondary | ICD-10-CM | POA: Diagnosis not present

## 2018-05-17 DIAGNOSIS — Z66 Do not resuscitate: Secondary | ICD-10-CM | POA: Diagnosis present

## 2018-05-17 DIAGNOSIS — Z85028 Personal history of other malignant neoplasm of stomach: Secondary | ICD-10-CM

## 2018-05-17 DIAGNOSIS — C384 Malignant neoplasm of pleura: Secondary | ICD-10-CM | POA: Diagnosis not present

## 2018-05-17 DIAGNOSIS — R599 Enlarged lymph nodes, unspecified: Secondary | ICD-10-CM | POA: Diagnosis present

## 2018-05-17 DIAGNOSIS — Z86010 Personal history of colonic polyps: Secondary | ICD-10-CM | POA: Diagnosis not present

## 2018-05-17 DIAGNOSIS — R05 Cough: Secondary | ICD-10-CM | POA: Diagnosis not present

## 2018-05-17 DIAGNOSIS — I5032 Chronic diastolic (congestive) heart failure: Secondary | ICD-10-CM | POA: Diagnosis present

## 2018-05-17 DIAGNOSIS — Z888 Allergy status to other drugs, medicaments and biological substances status: Secondary | ICD-10-CM

## 2018-05-17 DIAGNOSIS — J189 Pneumonia, unspecified organism: Secondary | ICD-10-CM | POA: Diagnosis not present

## 2018-05-17 DIAGNOSIS — M549 Dorsalgia, unspecified: Secondary | ICD-10-CM | POA: Diagnosis not present

## 2018-05-17 DIAGNOSIS — S2241XA Multiple fractures of ribs, right side, initial encounter for closed fracture: Secondary | ICD-10-CM | POA: Diagnosis not present

## 2018-05-17 DIAGNOSIS — J948 Other specified pleural conditions: Secondary | ICD-10-CM | POA: Diagnosis not present

## 2018-05-17 DIAGNOSIS — J918 Pleural effusion in other conditions classified elsewhere: Secondary | ICD-10-CM | POA: Diagnosis not present

## 2018-05-17 DIAGNOSIS — R591 Generalized enlarged lymph nodes: Secondary | ICD-10-CM | POA: Diagnosis not present

## 2018-05-17 LAB — CBC WITH DIFFERENTIAL/PLATELET
BASOS PCT: 0 %
Basophils Absolute: 0 10*3/uL (ref 0.0–0.1)
Eosinophils Absolute: 0 10*3/uL (ref 0.0–0.7)
Eosinophils Relative: 0 %
HEMATOCRIT: 27.6 % — AB (ref 39.0–52.0)
HEMOGLOBIN: 8.5 g/dL — AB (ref 13.0–17.0)
LYMPHS ABS: 0.5 10*3/uL (ref 0.7–4.0)
LYMPHS PCT: 6 %
MCH: 30.6 pg (ref 26.0–34.0)
MCHC: 30.8 g/dL (ref 30.0–36.0)
MCV: 99.3 fL (ref 78.0–100.0)
MONO ABS: 0.5 10*3/uL (ref 0.1–1.0)
MONOS PCT: 5 %
NEUTROS ABS: 7.9 10*3/uL (ref 1.7–7.7)
Neutrophils Relative %: 89 %
Platelets: 301 10*3/uL (ref 150–400)
RBC: 2.78 MIL/uL — ABNORMAL LOW (ref 4.22–5.81)
RDW: 13.6 % (ref 11.5–15.5)
WBC: 9 10*3/uL (ref 4.0–10.5)

## 2018-05-17 LAB — LACTATE DEHYDROGENASE, PLEURAL OR PERITONEAL FLUID: LD FL: 476 U/L — AB (ref 3–23)

## 2018-05-17 LAB — COMPREHENSIVE METABOLIC PANEL
ALBUMIN: 2.9 g/dL — AB (ref 3.5–5.0)
ALK PHOS: 100 U/L (ref 38–126)
ALT: 14 U/L — ABNORMAL LOW (ref 17–63)
ANION GAP: 10 (ref 5–15)
AST: 26 U/L (ref 15–41)
BUN: 34 mg/dL — ABNORMAL HIGH (ref 6–20)
CALCIUM: 9 mg/dL (ref 8.9–10.3)
CHLORIDE: 96 mmol/L — AB (ref 101–111)
CO2: 31 mmol/L (ref 22–32)
Creatinine, Ser: 1.1 mg/dL (ref 0.61–1.24)
GFR calc Af Amer: >60 mL/min (ref >60)
GFR calc non Af Amer: 59 mL/min — ABNORMAL LOW (ref >60)
GLUCOSE: 118 mg/dL — AB (ref 65–99)
Potassium: 4.2 mmol/L (ref 3.5–5.1)
SODIUM: 137 mmol/L (ref 135–145)
Total Bilirubin: 0.5 mg/dL (ref 0.3–1.2)
Total Protein: 6.5 g/dL (ref 6.5–8.1)

## 2018-05-17 LAB — GLUCOSE, PLEURAL OR PERITONEAL FLUID: Glucose, Fluid: 56 mg/dL

## 2018-05-17 LAB — BODY FLUID CELL COUNT WITH DIFFERENTIAL
EOS FL: 9 %
LYMPHS FL: 49 %
MONOCYTE-MACROPHAGE-SEROUS FLUID: 8 % — AB (ref 50–90)
Neutrophil Count, Fluid: 34 % — ABNORMAL HIGH (ref 0–25)
Total Nucleated Cell Count, Fluid: 1066 cu mm — ABNORMAL HIGH (ref 0–1000)

## 2018-05-17 LAB — I-STAT CG4 LACTIC ACID, ED: LACTIC ACID, VENOUS: 1.67 mmol/L (ref 0.5–1.9)

## 2018-05-17 LAB — URINALYSIS, ROUTINE W REFLEX MICROSCOPIC
Bilirubin Urine: NEGATIVE
Glucose, UA: NEGATIVE mg/dL
Hgb urine dipstick: NEGATIVE
Ketones, ur: NEGATIVE mg/dL
Leukocytes, UA: NEGATIVE
Nitrite: NEGATIVE
PH: 5 (ref 5.0–8.0)
Protein, ur: NEGATIVE mg/dL
SPECIFIC GRAVITY, URINE: 1.021 (ref 1.005–1.030)

## 2018-05-17 LAB — PROTEIN, PLEURAL OR PERITONEAL FLUID: TOTAL PROTEIN, FLUID: 3.1 g/dL

## 2018-05-17 LAB — I-STAT CHEM 8, ED
BUN: 31 mg/dL — AB (ref 6–20)
CALCIUM ION: 1.18 mmol/L (ref 1.15–1.40)
CHLORIDE: 93 mmol/L — AB (ref 101–111)
CREATININE: 1 mg/dL (ref 0.61–1.24)
Glucose, Bld: 112 mg/dL — ABNORMAL HIGH (ref 65–99)
HCT: 26 % — ABNORMAL LOW (ref 39.0–52.0)
Hemoglobin: 8.8 g/dL — ABNORMAL LOW (ref 13.0–17.0)
Potassium: 4.1 mmol/L (ref 3.5–5.1)
Sodium: 136 mmol/L (ref 135–145)
TCO2: 29 mmol/L (ref 22–32)

## 2018-05-17 LAB — GRAM STAIN

## 2018-05-17 MED ORDER — ASPIRIN 81 MG PO CHEW: 81.0000 mg | CHEWABLE_TABLET | Freq: Every day | ORAL | Status: DC

## 2018-05-17 MED ORDER — VANCOMYCIN HCL IN DEXTROSE 1-5 GM/200ML-% IV SOLN
1000.0000 mg | Freq: Once | INTRAVENOUS | Status: AC
  Administered 2018-05-17: 1000 mg via INTRAVENOUS
  Filled 2018-05-17: qty 200

## 2018-05-17 MED ORDER — SIMVASTATIN 40 MG PO TABS
40.0000 mg | ORAL_TABLET | Freq: Every day | ORAL | Status: DC
  Administered 2018-05-17 – 2018-05-24 (×8): 40 mg via ORAL
  Filled 2018-05-17 (×9): qty 1

## 2018-05-17 MED ORDER — LEVOTHYROXINE SODIUM 50 MCG PO TABS
50.0000 ug | ORAL_TABLET | Freq: Every day | ORAL | Status: DC
  Administered 2018-05-18 – 2018-05-26 (×9): 50 ug via ORAL
  Filled 2018-05-17 (×9): qty 1

## 2018-05-17 MED ORDER — PIPERACILLIN-TAZOBACTAM 3.375 G IVPB 30 MIN
3.3750 g | Freq: Once | INTRAVENOUS | Status: DC
Start: 2018-05-17 — End: 2018-05-17

## 2018-05-17 MED ORDER — ACETAMINOPHEN 650 MG RE SUPP: 650.0000 mg | Freq: Four times a day (QID) | RECTAL | Status: DC | PRN

## 2018-05-17 MED ORDER — SODIUM CHLORIDE 0.9 % IV BOLUS
250.0000 mL | Freq: Once | INTRAVENOUS | Status: AC
  Administered 2018-05-17: 250 mL via INTRAVENOUS

## 2018-05-17 MED ORDER — LIDOCAINE HCL 1 % IJ SOLN
INTRAMUSCULAR | Status: AC
  Filled 2018-05-17: qty 20

## 2018-05-17 MED ORDER — ONDANSETRON HCL 4 MG/2ML IJ SOLN: 4.0000 mg | Freq: Four times a day (QID) | INTRAMUSCULAR | Status: DC | PRN

## 2018-05-17 MED ORDER — ACETAMINOPHEN 325 MG PO TABS
650.0000 mg | ORAL_TABLET | Freq: Four times a day (QID) | ORAL | Status: DC | PRN
  Administered 2018-05-17: 650 mg via ORAL
  Filled 2018-05-17: qty 2

## 2018-05-17 MED ORDER — ONDANSETRON HCL 4 MG PO TABS: 4.0000 mg | ORAL_TABLET | Freq: Four times a day (QID) | ORAL | Status: DC | PRN

## 2018-05-17 MED ORDER — FUROSEMIDE 40 MG PO TABS: 40.0000 mg | ORAL_TABLET | Freq: Every day | ORAL | Status: DC

## 2018-05-17 MED ORDER — LEVOFLOXACIN IN D5W 500 MG/100ML IV SOLN
500.0000 mg | Freq: Once | INTRAVENOUS | Status: AC
  Administered 2018-05-17: 500 mg via INTRAVENOUS
  Filled 2018-05-17: qty 100

## 2018-05-17 MED ORDER — METOPROLOL TARTRATE 25 MG PO TABS: 12.5000 mg | ORAL_TABLET | Freq: Two times a day (BID) | ORAL | Status: DC

## 2018-05-17 MED ORDER — SODIUM CHLORIDE 0.9 % IV BOLUS
1000.0000 mL | Freq: Once | INTRAVENOUS | Status: AC
  Administered 2018-05-17: 1000 mL via INTRAVENOUS

## 2018-05-17 MED ORDER — TAMSULOSIN HCL 0.4 MG PO CAPS
0.4000 mg | ORAL_CAPSULE | Freq: Every day | ORAL | Status: DC
  Administered 2018-05-17 – 2018-05-24 (×8): 0.4 mg via ORAL
  Filled 2018-05-17 (×9): qty 1

## 2018-05-17 MED ORDER — FERROUS SULFATE 325 (65 FE) MG PO TABS
325.0000 mg | ORAL_TABLET | Freq: Every day | ORAL | Status: DC
  Administered 2018-05-18 – 2018-05-24 (×7): 325 mg via ORAL
  Filled 2018-05-17 (×7): qty 1

## 2018-05-17 NOTE — H&P (Signed)
History and Physical    Kyle Armstrong FAO:[id number] DOB: 01-04-1930 DOA: 05/17/2018  PCP: Lawerance Cruel, MD  Patient coming from: SNF  Chief Complaint: Hypoxia at nursing home  HPI: Kyle Armstrong is a 84 y.o. male with medical history significant of chronic diastolic heart failure, hyperlipidemia, BPH who was recently hospitalized for on chronic diastolic congestive heart failure, large left-sided pleural effusion which was removed with thoracentesis on 5/21.  Since then, he has been at a nursing home.  He now returns with findings of hypoxia at his nursing home.  He typically requires 2 L nasal cannula O2, and required 4 L nasal cannula to maintain sats above 92.  He states that he has had a productive cough.  Otherwise he has no other complaints, denies any fevers or chills, no chest pain or worsening shortness of breath, no nausea, vomiting, diarrhea, abdominal pain or peripheral edema.  ED Course: Patient was placed on 4 L nasal cannula O2 up from his baseline of 2 L.  Labs overall unremarkable.  X-ray does reveal near total opacification left hemithorax compatible with large pleural effusion.  He was started on empiric Vanco and Levaquin.  Review of Systems: As per HPI otherwise 10 point review of systems negative.   Past Medical History:  Diagnosis Date  . Allergy   . Arthritis   . Eczema   . Gastric cancer ([place]) 10/24/2013  . Hx of radiation therapy 11/10/13-12/21/13   gastric ca, total 50.4Gy  . Hyperlipidemia   . Hypothyroidism   . Lazy eye of right side   . Pernicious anemia   . 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 EP study and ablation  07/16/12   slow pathway ablation for AVNRT by DR Allred  . HERNIA REPAIR  1980's   inguinal done x 2  . IR THORACENTESIS ASP PLEURAL SPACE W/IMG GUIDE  04/13/2018  . LAPAROSCOPIC CHOLECYSTECTOMY W/ CHOLANGIOGRAPHY  2011  .  LAPAROSCOPIC PARTIAL GASTRECTOMY N/A 02/15/2014   Procedure: LAPAROSCOPIC diagnositc, distal gastrostomy  feeding jejunostomy;  Surgeon: Stark Klein, MD;  Location: [facility];  Service: General;  Laterality: N/A;  . LAPAROSCOPIC [place] Bilateral 2007   bil recurrent inguinal hernias  . SUPRAVENTRICULAR TACHYCARDIA ABLATION Bilateral 07/16/2012   Procedure: SUPRAVENTRICULAR TACHYCARDIA ABLATION;  Surgeon: Thompson Grayer, MD;  Location: [facility] C[facility];  Service: Cardiovascular;  Laterality: Bilateral;     reports that he quit smoking about 27 years ago. His smoking use included cigarettes. He has a 30.00 pack-year smoking history. He has never used smokeless tobacco. He reports that he does not drink alcohol or use drugs.  Allergies  Allergen Reactions  . Lipitor [Atorvastatin] Other (See Comments)    Leg cramps  . Amoxicillin Rash and Other (See Comments)    Has patient had a PCN reaction causing immediate rash, facial/tongue/throat swelling, SOB or lightheadedness with hypotension: yes Has patient had a PCN reaction causing severe rash involving mucus membranes or skin necrosis: no Has patient had a PCN reaction that required hospitalization: unknown Has patient had a PCN reaction occurring within the last 10 years: unknown If all of the above answers are "NO", then may proceed with Cephalosporin use.   . Augmentin [Amoxicillin-Pot Clavulanate] Rash and Other (See Comments)    Has patient had a PCN reaction causing immediate rash, facial/tongue/throat swelling, SOB or lightheadedness with hypotension: yes Has patient  had a PCN reaction causing severe rash involving mucus membranes or skin necrosis: no Has patient had a PCN reaction that required hospitalization: unknown Has patient had a PCN reaction occurring within the last 10 years: unknown If all of the above answers are "NO", then may proceed with Cephalosporin use.     Family History  Problem Relation  Age of Onset  . Heart attack Mother   . Heart attack Father   . Hypertension Unknown     Prior to Admission medications   Medication Sig Start Date End Date Taking? Authorizing Provider  acetaminophen (TYLENOL) 500 MG tablet Take 1,000 mg by mouth every 8 (eight) hours as needed (For pain.).   Yes [provider]  albuterol (PROVENTIL HFA;VENTOLIN HFA) 108 (90 Base) MCG/ACT inhaler Inhale 2 puffs into the lungs every 6 (six) hours as needed for wheezing or shortness of breath. 04/16/18  Yes Emokpae, Courage, MD  Amino Acids-Protein Hydrolys (FEEDING SUPPLEMENT, PRO-STAT SUGAR FREE 64,) LIQD Take 30 mLs by mouth daily.   Yes [provider]  aspirin 81 MG chewable tablet Chew 81 mg by mouth daily.   Yes [provider]  cyanocobalamin (,VITAMIN B-12,) 1000 MCG/ML injection Inject 1,000 mcg into the muscle every 30 (thirty) days.   Yes [provider]  ferrous sulfate 325 (65 FE) MG EC tablet Take 325 mg by mouth daily.  06/16/14  Yes Ladell Pier, MD  furosemide (LASIX) 40 MG tablet Take 40 mg by mouth.   Yes [provider]  levothyroxine (SYNTHROID, LEVOTHROID) 50 MCG tablet Take 1 tablet (50 mcg total) by mouth daily before breakfast. 04/16/18  Yes Emokpae, Courage, MD  metoprolol tartrate (LOPRESSOR) 25 MG tablet Take 0.5 tablets (12.5 mg total) by mouth daily. Patient taking differently: Take 12.5 mg by mouth 2 (two) times daily. Hold if SBP is less than 100 or DBP is less than 50. 04/23/18  Yes Fay Records, MD  potassium chloride SA (K-DUR,KLOR-CON) 20 MEQ tablet Take 1 tablet (20 mEq total) by mouth daily. 06/26/17  Yes Rai, Ripudeep K, MD  simvastatin (ZOCOR) 40 MG tablet Take 1 tablet (40 mg total) by mouth every evening. Patient taking differently: Take 40 mg by mouth at bedtime.  04/16/18  Yes Emokpae, Courage, MD  tamsulosin (FLOMAX) 0.4 MG CAPS capsule Take 1 capsule (0.4 mg total) by mouth daily. Patient taking differently: Take 0.4 mg by  mouth at bedtime.  04/16/18  Yes Roxan Hockey, MD    Physical Exam: Vitals:   05/17/18 1155 05/17/18 1206 05/17/18 1207 05/17/18 1400  BP:   93/65 99/60  Pulse:   80 81  Resp:   (!) 22 20  Temp:   98.2 F (36.8 C)   TempSrc:   Oral   SpO2: 98%  94% 100%  Weight:  62.4 kg (137 lb 8 oz)    Height:  5\' 7"  (1.702 m)      Constitutional: NAD, calm, comfortable Eyes: PERRL, lids and conjunctivae normal ENMT: Mucous membranes are moist. Posterior pharynx clear of any exudate or lesions.Normal dentition.  Neck: normal, supple, no masses, no thyromegaly Respiratory: Diminished breath sounds on left, right side is clear without wheeze or rhonchi.  Appears comfortable on nasal cannula O2 without respiratory distress.  Normal respiratory effort. No accessory muscle use.  Cardiovascular: Regular rate and rhythm, no murmurs / rubs / gallops. No extremity edema.  Abdomen: no tenderness, no masses palpated. No hepatosplenomegaly. Bowel sounds positive.  Musculoskeletal: no clubbing /  cyanosis. No joint deformity upper and lower extremities. Good ROM, no contractures. Normal muscle tone.  Skin: no rashes, lesions, ulcers. No induration Neurologic: CN 2-12 grossly intact.  Nonfocal.  Speech is clear. Strength 5/5 in all 4.  Psychiatric: Normal judgment and insight. Alert and oriented x 3. Normal mood.   Labs on Admission: I have personally reviewed following labs and imaging studies  CBC: Recent Labs  Lab 05/17/18 1229 05/17/18 1246  WBC 9.0  --   NEUTROABS 7.9  --   HGB 8.5* 8.8*  HCT 27.6* 26.0*  MCV 99.3  --   PLT 301  --    Basic Metabolic Panel: Recent Labs  Lab 05/17/18 1229 05/17/18 1246  NA 137 136  K 4.2 4.1  CL 96* 93*  CO2 31  --   GLUCOSE 118* 112*  BUN 34* 31*  CREATININE 1.10 1.00  CALCIUM 9.0  --    GFR: Estimated Creatinine Clearance: 46.8 mL/min (by C-G formula based on SCr of 1 mg/dL). Liver Function Tests: Recent Labs  Lab 05/17/18 1229  AST 26  ALT  14*  ALKPHOS 100  BILITOT 0.5  PROT 6.5  ALBUMIN 2.9*   No results for input(s): LIPASE, AMYLASE in the last 168 hours. No results for input(s): AMMONIA in the last 168 hours. Coagulation Profile: No results for input(s): INR, PROTIME in the last 168 hours. Cardiac Enzymes: No results for input(s): CKTOTAL, CKMB, CKMBINDEX, TROPONINI in the last 168 hours. BNP (last 3 results) No results for input(s): PROBNP in the last 8760 hours. HbA1C: No results for input(s): HGBA1C in the last 72 hours. CBG: No results for input(s): GLUCAP in the last 168 hours. Lipid Profile: No results for input(s): CHOL, HDL, LDLCALC, TRIG, CHOLHDL, LDLDIRECT in the last 72 hours. Thyroid Function Tests: No results for input(s): TSH, T4TOTAL, FREET4, T3FREE, THYROIDAB in the last 72 hours. Anemia Panel: No results for input(s): VITAMINB12, FOLATE, FERRITIN, TIBC, IRON, RETICCTPCT in the last 72 hours. Urine analysis:    Component Value Date/Time   COLORURINE YELLOW 05/17/2018 1246   APPEARANCEUR HAZY (A) 05/17/2018 1246   LABSPEC 1.021 05/17/2018 1246   PHURINE 5.0 05/17/2018 1246   GLUCOSEU NEGATIVE 05/17/2018 1246   HGBUR NEGATIVE 05/17/2018 [place] 05/17/2018 1246   KETONESUR NEGATIVE 05/17/2018 1246   PROTEINUR NEGATIVE 05/17/2018 1246   UROBILINOGEN 1.0 02/09/2014 1317   NITRITE NEGATIVE 05/17/2018 1246   LEUKOCYTESUR NEGATIVE 05/17/2018 1246   Sepsis Labs: !!!!!!!!!!!!!!!!!!!!!!!!!!!!!!!!!!!!!!!!!!!! @LABRCNTIP (procalcitonin:4,lacticidven:4) )No results found for this or any previous visit (from the past 240 hour(s)).   Radiological Exams on Admission: Dg Chest 2 View  Result Date: 05/17/2018 CLINICAL DATA:  Shortness of breath. History of left-sided pneumonia. EXAM: CHEST - 2 VIEW COMPARISON:  Portable chest x-ray of Apr 13, 2018 FINDINGS: There is near-total opacification of the left hemithorax. Only a small amount of aerated lung is noted in the apex. There is slight  shift of the mediastinum toward the right. The interstitial markings of the right lung are coarse and slightly more conspicuous today. There is no definite right pleural effusion. The left heart border is obscured. The right heart border appears normal. There is severe degenerative change of the left shoulder. IMPRESSION: New near-total opacification of the left hemithorax compatible with large pleural effusion and likely left lung atelectasis. The possibility of a central obstructing lesion is raised. Slight mediastinal shift toward the right. Mildly increased interstitial densities throughout the right lung may reflect edema or less likely  pneumonia. Electronically Signed   By: David  Martinique M.D.   On: 05/17/2018 13:53    EKG: Independently reviewed.  Normal sinus rhythm, poor R wave progression  Assessment/Plan Principal Problem:   Acute hypoxemic respiratory failure (HCC) Active Problems:   Hyperlipidemia   Hypothyroidism   Pleural effusion   Acute on chronic hypoxemic respiratory failure -At baseline on 2L [place]  -Currently requiring 4L [place] O2, wean as able after thoracentesis  Large left pleural effusion -Previously thought to be due to CHF -Thoracentesis ordered -?Infectious vs CHF driven. Patient without fevers, HR 80, WBC 9, lactic acid 1.67. He received vanco/levaquin in the ED. Will hold off on additional antibiotics until fluid sample obtained  Chronic congestive heart failure -Continue home Lasix, metoprolol -Unclear that this is acute exacerbation leading to pleural effusion.  Pleural effusion is unilateral, patient has no peripheral edema  Hyperlipidemia -Continue Zocor  BPH -Continue Flomax  Hypothyroidism -Continue Synthroid   DVT prophylaxis: SCD until after thoracentesis Code Status: Full  Family Communication: Son at bedside  Disposition Plan: Pending improvement, return back to skilled nursing facility on discharge Consults called: None  Admission status:  Inpatient   * I certify that at the point of admission it is my clinical judgment that the patient will require inpatient hospital care spanning beyond 2 midnights from the point of admission due to high intensity of service, high risk for further deterioration and high frequency of surveillance required.*   Dessa Phi, DO [facility] [email or web address] Password [facility] 05/17/2018, 3:46 PM

## 2018-05-17 NOTE — Progress Notes (Signed)
Alert and oriented but not eating well, appears short of breath with little exertion.

## 2018-05-17 NOTE — ED Notes (Signed)
ED TO INPATIENT HANDOFF REPORT  Name/Age/Gender Kyle Armstrong 84 y.o. male  Code Status Code Status History    Date Active Date Inactive Code Status Order ID Comments User Context   04/11/2018 0415 04/16/2018 1658 Full Code [id number]  Harvie Bridge, DO ED   06/21/2017 8250 06/24/2017 2152 Full Code [id number]  Ivor Costa, MD ED   02/15/2014 1402 02/24/2014 1609 Full Code [id number]  Stark Klein, MD Inpatient   07/16/2012 1626 07/17/2012 1651 Full Code [id number]  Sanda Linger, RN Inpatient      Home/SNF/Other Nursing Home  Chief Complaint Pneomonia   Level of Care/Admitting Diagnosis ED Disposition    ED Disposition Condition [place]: [facility] [[id number]]  Level of Care: Telemetry [5]  Admit to tele based on following criteria: Acute CHF  Diagnosis: Acute hypoxemic respiratory failure [facility]) [[id number]]  Admitting Physician: Dessa Phi [[id number]]  Attending Physician: Dessa Phi [phone]  Estimated length of stay: past midnight tomorrow  Certification:: I certify this patient will need inpatient services for at least 2 midnights  PT Class (Do Not Modify): Inpatient [101]  PT Acc Code (Do Not Modify): Private [1]       Medical History Past Medical History:  Diagnosis Date  . Allergy   . Arthritis   . Eczema   . Gastric cancer ([place]) 10/24/2013  . Hx of radiation therapy 11/10/13-12/21/13   gastric ca, total 50.4Gy  . Hyperlipidemia   . Hypothyroidism   . Lazy eye of right side   . Pernicious anemia   . 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 Lipitor [Atorvastatin] Other (See Comments)    Leg cramps  . Amoxicillin Rash and Other (See Comments)    Has patient had a PCN reaction causing immediate rash, facial/tongue/throat swelling, SOB or lightheadedness with hypotension: yes Has patient had a PCN reaction causing severe  rash involving mucus membranes or skin necrosis: no Has patient had a PCN reaction that required hospitalization: unknown Has patient had a PCN reaction occurring within the last 10 years: unknown If all of the above answers are "NO", then may proceed with Cephalosporin use.   . Augmentin [Amoxicillin-Pot Clavulanate] Rash and Other (See Comments)    Has patient had a PCN reaction causing immediate rash, facial/tongue/throat swelling, SOB or lightheadedness with hypotension: yes Has patient had a PCN reaction causing severe rash involving mucus membranes or skin necrosis: no Has patient had a PCN reaction that required hospitalization: unknown Has patient had a PCN reaction occurring within the last 10 years: unknown If all of the above answers are "NO", then may proceed with Cephalosporin use.     IV Location/Drains/Wounds Patient Lines/Drains/Airways Status   Active Line/Drains/Airways    None          Labs/Imaging Results for orders placed or performed during the hospital encounter of 05/17/18 (from the past 48 hour(s))  Comprehensive metabolic panel     Status: Abnormal   Collection Time: 05/17/18 12:29 PM  Result Value Ref Range   Sodium 137 135 - 145 mmol/L   Potassium 4.2 3.5 - 5.1 mmol/L   Chloride 96 (L) 101 - 111 mmol/L   CO2 31 22 - 32 mmol/L   Glucose, Bld 118 (H) 65 - 99 mg/dL   BUN 34 (H) 6 - 20 mg/dL   Creatinine, Ser 1.10 0.61 - 1.24 mg/dL  Calcium 9.0 8.9 - 10.3 mg/dL   Total Protein 6.5 6.5 - 8.1 g/dL   Albumin 2.9 (L) 3.5 - 5.0 g/dL   AST 26 15 - 41 U/L   ALT 14 (L) 17 - 63 U/L   Alkaline Phosphatase 100 38 - 126 U/L   Total Bilirubin 0.5 0.3 - 1.2 mg/dL   GFR calc non Af Amer 59 (L) >60 mL/min   GFR calc Af Amer >60 >60 mL/min    Comment: (NOTE) The eGFR has been calculated using the CKD EPI equation. This calculation has not been validated in all clinical situations. eGFR's persistently <60 mL/min signify possible Chronic Kidney Disease.     Anion gap 10 5 - 15    Comment: Performed at [facility], [place] [address], [place], [place] [address]  CBC WITH DIFFERENTIAL     Status: Abnormal   Collection Time: 05/17/18 12:29 PM  Result Value Ref Range   WBC 9.0 4.0 - 10.5 K/uL   RBC 2.78 (L) 4.22 - 5.81 MIL/uL   Hemoglobin 8.5 (L) 13.0 - 17.0 g/dL   HCT 27.6 (L) 39.0 - 52.0 %   MCV 99.3 78.0 - 100.0 fL   MCH 30.6 26.0 - 34.0 pg   MCHC 30.8 30.0 - 36.0 g/dL   RDW 13.6 11.5 - 15.5 %   Platelets 301 150 - 400 K/uL   Neutrophils Relative % 89 %   Neutro Abs 7.9 1.7 - 7.7 K/uL   Lymphocytes Relative 6 %   Lymphs Abs 0.5 0.7 - 4.0 K/uL   Monocytes Relative 5 %   Monocytes Absolute 0.5 0.1 - 1.0 K/uL   Eosinophils Relative 0 %   Eosinophils Absolute 0.0 0.0 - 0.7 K/uL   Basophils Relative 0 %   Basophils Absolute 0.0 0.0 - 0.1 K/uL   RBC Morphology POLYCHROMASIA PRESENT     Comment: Performed at [facility], [place] [address] [place], [place] [address]  I-Stat CG4 Lactic Acid, ED  (not at  [facility])     Status: None   Collection Time: 05/17/18 12:32 PM  Result Value Ref Range   Lactic Acid, Venous 1.67 0.5 - 1.9 mmol/L  Urinalysis, Routine w reflex microscopic     Status: Abnormal   Collection Time: 05/17/18 12:46 PM  Result Value Ref Range   Color, Urine YELLOW YELLOW   APPearance HAZY (A) CLEAR   Specific Gravity, Urine 1.021 1.005 - 1.030   pH 5.0 5.0 - 8.0   Glucose, UA NEGATIVE NEGATIVE mg/dL   Hgb urine dipstick NEGATIVE NEGATIVE   Bilirubin Urine NEGATIVE NEGATIVE   Ketones, ur NEGATIVE NEGATIVE mg/dL   Protein, ur NEGATIVE NEGATIVE mg/dL   Nitrite NEGATIVE NEGATIVE   Leukocytes, UA NEGATIVE NEGATIVE    Comment: Performed at [facility], [place] [address], [place], [place] [address]  I-stat chem 8, ed     Status: Abnormal   Collection Time: 05/17/18 12:46 PM  Result Value Ref Range   Sodium 136 135 - 145 mmol/L   Potassium 4.1 3.5 - 5.1 mmol/L   Chloride 93 (L)  101 - 111 mmol/L   BUN 31 (H) 6 - 20 mg/dL   Creatinine, Ser 1.00 0.61 - 1.24 mg/dL   Glucose, Bld 112 (H) 65 - 99 mg/dL   Calcium, Ion 1.18 1.15 - 1.40 mmol/L   TCO2 29 22 - 32 mmol/L   Hemoglobin 8.8 (L) 13.0 - 17.0 g/dL   HCT 26.0 (L) 39.0 -  52.0 %   Dg Chest 2 View  Result Date: 05/17/2018 CLINICAL DATA:  Shortness of breath. History of left-sided pneumonia. EXAM: CHEST - 2 VIEW COMPARISON:  Portable chest x-ray of Apr 13, 2018 FINDINGS: There is near-total opacification of the left hemithorax. Only a small amount of aerated lung is noted in the apex. There is slight shift of the mediastinum toward the right. The interstitial markings of the right lung are coarse and slightly more conspicuous today. There is no definite right pleural effusion. The left heart border is obscured. The right heart border appears normal. There is severe degenerative change of the left shoulder. IMPRESSION: New near-total opacification of the left hemithorax compatible with large pleural effusion and likely left lung atelectasis. The possibility of a central obstructing lesion is raised. Slight mediastinal shift toward the right. Mildly increased interstitial densities throughout the right lung may reflect edema or less likely pneumonia. Electronically Signed   By: David  Martinique M.D.   On: 05/17/2018 13:53    Pending Labs Unresulted Labs (From admission, onward)   Start     Ordered   05/17/18 1219  Blood Culture (routine x 2)  BLOOD CULTURE X 2,   STAT     05/17/18 1218   Signed and Held  CBC  Tomorrow morning,   R     Signed and Held   Signed and Held  Basic metabolic panel  Tomorrow morning,   R     Signed and Held      Vitals/Pain Today's Vitals   05/17/18 1155 05/17/18 1206 05/17/18 1207 05/17/18 1400  BP:   93/65 99/60  Pulse:   80 81  Resp:   (!) 22 20  Temp:   98.2 F (36.8 C)   TempSrc:   Oral   SpO2: 98%  94% 100%  Weight:  137 lb 8 oz (62.4 kg)    Height:  '5\' 7"'$  (1.702 m)    PainSc:   0-No pain      Isolation Precautions No active isolations  Medications Medications  sodium chloride 0.9 % bolus 1,000 mL (1,000 mLs Intravenous New Bag/Given 05/17/18 1238)  vancomycin (VANCOCIN) IVPB 1000 mg/200 mL premix (1,000 mg Intravenous New Bag/Given 05/17/18 1442)  levofloxacin (LEVAQUIN) IVPB 500 mg (0 mg Intravenous Stopped 05/17/18 1440)    Mobility walks with device

## 2018-05-17 NOTE — Procedures (Signed)
Ultrasound-guided diagnostic and therapeutic left thoracentesis performed yielding 1.3 liters of dark,bloody fluid. No immediate complications. Follow-up chest x-ray pending. Due to loculated nature of collection as well as hypotension only the above amount of fluid was removed today. A portion of the fluid was sent to the lab for preordered studies.

## 2018-05-17 NOTE — ED Triage Notes (Signed)
Patient BIB [facility] from [place] for left side pneumonia. Facility did a portable cxr that showed "white out" on left side per facility and EMS. Pt normally wears 2L o2, o2 sats in 80's on arrival. Now 98 on 4L. A/O x4 and at baseline per EMS.

## 2018-05-17 NOTE — ED Notes (Signed)
Bed: [facility]63 Expected date:  Expected time:  Means of arrival:  Comments: EMS-PNA

## 2018-05-18 ENCOUNTER — Encounter (HOSPITAL_COMMUNITY): Payer: Self-pay | Admitting: Radiology

## 2018-05-18 ENCOUNTER — Inpatient Hospital Stay (HOSPITAL_COMMUNITY): Payer: Medicare HMO

## 2018-05-18 DIAGNOSIS — J9601 Acute respiratory failure with hypoxia: Secondary | ICD-10-CM

## 2018-05-18 LAB — CBC
HCT: 19.6 % — ABNORMAL LOW (ref 39.0–52.0)
HEMATOCRIT: 24.1 % — AB (ref 39.0–52.0)
Hemoglobin: 6.2 g/dL — CL (ref 13.0–17.0)
Hemoglobin: 7.7 g/dL — ABNORMAL LOW (ref 13.0–17.0)
MCH: 31 pg (ref 26.0–34.0)
MCH: 29.8 pg (ref 26.0–34.0)
MCHC: 31.6 g/dL (ref 30.0–36.0)
MCHC: 32 g/dL (ref 30.0–36.0)
MCV: 98 fL (ref 78.0–100.0)
MCV: 93.4 fL (ref 78.0–100.0)
Platelets: 257 10*3/uL (ref 150–400)
Platelets: 268 10*3/uL (ref 150–400)
RBC: 2 MIL/uL — ABNORMAL LOW (ref 4.22–5.81)
RBC: 2.58 MIL/uL — ABNORMAL LOW (ref 4.22–5.81)
RDW: 14.2 % (ref 11.5–15.5)
RDW: 16.9 % — AB (ref 11.5–15.5)
WBC: 8.2 10*3/uL (ref 4.0–10.5)
WBC: 8.5 10*3/uL (ref 4.0–10.5)

## 2018-05-18 LAB — BASIC METABOLIC PANEL
Anion gap: 5 (ref 5–15)
BUN: 31 mg/dL — AB (ref 8–23)
CALCIUM: 8.1 mg/dL — AB (ref 8.9–10.3)
CO2: 31 mmol/L (ref 22–32)
CREATININE: 0.93 mg/dL (ref 0.61–1.24)
Chloride: 102 mmol/L (ref 98–111)
GFR calc non Af Amer: >60 mL/min (ref >60)
Glucose, Bld: 99 mg/dL (ref 70–99)
Potassium: 4.5 mmol/L (ref 3.5–5.1)
Sodium: 138 mmol/L (ref 135–145)

## 2018-05-18 LAB — PROTIME-INR
INR: 1.13
PROTHROMBIN TIME: 14.5 s (ref 11.4–15.2)

## 2018-05-18 LAB — CBC WITH DIFFERENTIAL/PLATELET
BASOS PCT: 0 %
Basophils Absolute: 0 10*3/uL (ref 0.0–0.1)
EOS ABS: 0.3 10*3/uL (ref 0.0–0.7)
EOS PCT: 4 %
HCT: 20.4 % — ABNORMAL LOW (ref 39.0–52.0)
Hemoglobin: 6.4 g/dL — CL (ref 13.0–17.0)
LYMPHS ABS: 0.7 10*3/uL (ref 0.7–4.0)
Lymphocytes Relative: 9 %
MCH: 30.6 pg (ref 26.0–34.0)
MCHC: 31.4 g/dL (ref 30.0–36.0)
MCV: 97.6 fL (ref 78.0–100.0)
Monocytes Absolute: 0.6 10*3/uL (ref 0.1–1.0)
Monocytes Relative: 7 %
Neutro Abs: 6 10*3/uL (ref 1.7–7.7)
Neutrophils Relative %: 80 %
PLATELETS: 275 10*3/uL (ref 150–400)
RBC: 2.09 MIL/uL — AB (ref 4.22–5.81)
RDW: 14.1 % (ref 11.5–15.5)
WBC: 7.5 10*3/uL (ref 4.0–10.5)

## 2018-05-18 LAB — RETICULOCYTES
RBC.: 2.09 MIL/uL — ABNORMAL LOW (ref 4.22–5.81)
RETIC CT PCT: 2.7 % (ref 0.4–3.1)
Retic Count, Absolute: 56.4 10*3/uL (ref 19.0–186.0)

## 2018-05-18 LAB — FIBRINOGEN: FIBRINOGEN: 464 mg/dL (ref 210–475)

## 2018-05-18 LAB — PREPARE RBC (CROSSMATCH)

## 2018-05-18 LAB — LACTATE DEHYDROGENASE: LDH: 191 U/L (ref 98–192)

## 2018-05-18 LAB — MRSA PCR SCREENING: MRSA by PCR: NEGATIVE

## 2018-05-18 MED ORDER — SODIUM CHLORIDE 0.9 % IV BOLUS
500.0000 mL | Freq: Once | INTRAVENOUS | Status: AC
  Administered 2018-05-18: 500 mL via INTRAVENOUS

## 2018-05-18 MED ORDER — IOHEXOL 300 MG/ML  SOLN
75.0000 mL | Freq: Once | INTRAMUSCULAR | Status: AC | PRN
  Administered 2018-05-18: 75 mL via INTRAVENOUS

## 2018-05-18 MED ORDER — SODIUM CHLORIDE 0.9% IV SOLUTION
Freq: Once | INTRAVENOUS | Status: AC
  Administered 2018-05-18: 11:00:00 via INTRAVENOUS

## 2018-05-18 NOTE — Progress Notes (Signed)
PT Cancellation Note  Patient Details Name: Kyle Armstrong MRN: [id number] DOB: 1930/03/11   Cancelled Treatment:    Reason Eval/Treat Not Completed: Medical issues which prohibited therapy; attempted this am and pt to receive transfusion.  This pm out for testing.  Will attempt to see another day.   Reginia Naas 05/18/2018, 2:35 PM  Magda Kiel, [place] 05/18/2018

## 2018-05-18 NOTE — Progress Notes (Signed)
[facility] Progress Note  Patient: Kyle Armstrong VOH:[id number]   PCP: Lawerance Cruel, MD DOB: 1930-07-01   DOA: 05/17/2018   DOS: 05/18/2018   Date of Service: the patient was seen and examined on 05/18/2018  Subjective: Patient is feeling better.  Still short of breath still has some cough and chest pain.  No nausea no vomiting.  Brief hospital course: Pt. with PMH of chronic diastolic CHF, HLD, BPH; admitted on 05/17/2018, presented with complaint of shortness of breath, was found to have recurrent left pleural effusion secondary to left lung mass. Currently further plan is continue close monitoring and supportive measures as well as follow-up on recommendation from oncology.  Assessment and Plan: 1.  Acute on chronic hypoxic respiratory failure. Recurrent left pleural effusion-hemothorax.  Likely malignant Left lung cancer with mediastinal mass.   prior history of gastric carcinoma treated with surgery and in remission. Baseline uses 2 L of oxygen. Currently back on baseline after thoracentesis. 1.2 L of bloody fluid removed. Cytology as well as infectious work-up from prior thoracentesis was negative although CT of the chest performed this admission shows large central left lung mass roughly 7 x 8 cm size contiguous with left hilum mass along the pulmonary artery probably involving pleura and pericardium along with paraesophageal, bilateral hilar subcarinal adenopathy.  Improved but persistent left hemithorax compatible with stage IV lung cancer. Patient was informed about this finding, he wants me to discuss with his son as well. Discussed with Dr. Ammie Dalton whom the patient has seen in the past for gastric cancer who will see the patient in the morning. We will follow-up on recommendation. Currently oxygenation is better, continue close monitoring of H&H given hemothorax and hypotension as well as drop in the CBC. Currently receiving 1 PRBC. For hemodynamic instability or  hemoglobin less than 7 we will transfuse the patient. If the blood pressure is low I would ideally transfuse the patient as opposed to give him IV fluids. Initially thought to be fluid related to CHF and therefore patient was given IV Lasix will hold off on that right now. Even though no active bleeding noted on the CT scan, should the patient has persistent hemodynamic instability or no improvement in anemia may benefit from IR consultation for embolization. Palliative care may be needed as well to discuss goals of care as patient appears in poor shape for any aggressive intervention.  2.  Acute blood loss anemia. Receiving 1 PRBC, will recheck it.  Transfuse for hemoglobin less than 7 or hemodynamic instability.  3.  Chronic diastolic CHF. Essential hypertension. Currently holding antihypertensive medication as well as Lasix in the setting of hypotension. Monitor. Euvolemic for now.  4.  Dyslipidemia. Continue Zocor.  5.  BPH. Continue Flomax. Although the patient remains hypotensive may need to hold it.  6.  Hypothyroidism. Continue Synthroid.  7.  Chronic interstitial fibrosis of bilateral lung parenchyma with honeycombing. Likely the cause of patient's chronic hypoxia. Monitor.  Diet: Regular diet DVT Prophylaxis: mechanical compression device  Advance goals of care discussion: full code, Palliative care consulted  Family Communication: no family was present at bedside, at the time of interview.   Disposition:  Discharge to be determined.  Consultants: oncology, Palliative care  Procedures: thoracentesis   Antibiotics: Anti-infectives (From admission, onward)   Start     Dose/Rate Route Frequency Ordered Stop   05/17/18 1230  vancomycin (VANCOCIN) IVPB 1000 mg/200 mL premix     1,000 mg 200 mL/hr over 60 Minutes Intravenous  Once 05/17/18 1219 05/17/18 1617   05/17/18 1230  piperacillin-tazobactam (ZOSYN) IVPB 3.375 g  Status:  Discontinued     3.375 g 100 mL/hr  over 30 Minutes Intravenous  Once 05/17/18 1219 05/17/18 1220   05/17/18 1230  levofloxacin (LEVAQUIN) IVPB 500 mg     500 mg 100 mL/hr over 60 Minutes Intravenous  Once 05/17/18 1221 05/17/18 1440       Objective: Physical Exam: Vitals:   05/18/18 0453 05/18/18 1040 05/18/18 1110 05/18/18 1415  BP: (!) 90/58 92/61 93/60  105/67  Pulse: 85 86 83 88  Resp: 20 (!) 22 20 (!) 22  Temp: 98 F (36.7 C) 98.3 F (36.8 C) 97.9 F (36.6 C) 98 F (36.7 C)  TempSrc: Oral Oral Oral Oral  SpO2: 100% 100% 100% 100%  Weight:      Height:        Intake/Output Summary (Last 24 hours) at 05/18/2018 1610 Last data filed at 05/18/2018 1415 Gross per 24 hour  Intake 1018.33 ml  Output 100 ml  Net 918.33 ml   Filed Weights   05/17/18 1206 05/17/18 1741  Weight: 62.4 kg (137 lb 8 oz) 62.9 kg (138 lb 10.7 oz)   General: Alert, Awake and Oriented to Time, Place and Person. Appear in moderate distress, affect appropriate Eyes: PERRL, Conjunctiva normal ENT: Oral Mucosa clear moist. Neck: difficult to assess JVD, no Abnormal Mass Or lumps Cardiovascular: S1 and S2 Present, aortic systolic  Murmur, Peripheral Pulses Present Respiratory: increased respiratory effort, Bilateral Air entry equal and Decreased, no use of accessory muscle, bilateral  Crackles, no wheezes Abdomen: Bowel Sound present, Soft and no tenderness, no hernia Skin: no redness, no Rash, no induration Extremities: no Pedal edema, no calf tenderness Neurologic: Grossly no focal neuro deficit. Bilaterally Equal motor strength  Data Reviewed: CBC: Recent Labs  Lab 05/17/18 1229 05/17/18 1246 05/18/18 0522 05/18/18 0927  WBC 9.0  --  8.2 7.5  NEUTROABS 7.9  --   --  6.0  HGB 8.5* 8.8* 6.2* 6.4*  HCT 27.6* 26.0* 19.6* 20.4*  MCV 99.3  --  98.0 97.6  PLT 301  --  257 469   Basic Metabolic Panel: Recent Labs  Lab 05/17/18 1229 05/17/18 1246 05/18/18 0522  NA 137 136 138  K 4.2 4.1 4.5  CL 96* 93* 102  CO2 31  --  31    GLUCOSE 118* 112* 99  BUN 34* 31* 31*  CREATININE 1.10 1.00 0.93  CALCIUM 9.0  --  8.1*    Liver Function Tests: Recent Labs  Lab 05/17/18 1229  AST 26  ALT 14*  ALKPHOS 100  BILITOT 0.5  PROT 6.5  ALBUMIN 2.9*   No results for input(s): LIPASE, AMYLASE in the last 168 hours. No results for input(s): AMMONIA in the last 168 hours. Coagulation Profile: Recent Labs  Lab 05/18/18 0927  INR 1.13   Cardiac Enzymes: No results for input(s): CKTOTAL, CKMB, CKMBINDEX, TROPONINI in the last 168 hours. BNP (last 3 results) No results for input(s): PROBNP in the last 8760 hours. CBG: No results for input(s): GLUCAP in the last 168 hours. Studies: Dg Chest 1 View  Result Date: 05/17/2018 CLINICAL DATA:  Status post LEFT thoracentesis. EXAM: CHEST  1 VIEW COMPARISON:  Portable film earlier in the day. FINDINGS: Marked decrease LEFT pleural effusion. No visible pneumothorax. Cardiac enlargement, BILATERAL interstitial prominence is redemonstrated stable. Marked osseous degenerative change, both shoulders. IMPRESSION: Marked decreased LEFT pleural effusion post thoracentesis. No pneumothorax.  Electronically Signed   By: Staci Righter M.D.   On: 05/17/2018 16:55   Ct Chest W Contrast  Result Date: 05/18/2018 CLINICAL DATA:  History of CHF, remote gastric cancer, hypoxia, recurrent bloody left effusion status post thoracentesis twice EXAM: CT CHEST WITH CONTRAST TECHNIQUE: Multidetector CT imaging of the chest was performed during intravenous contrast administration. CONTRAST:  27mL OMNIPAQUE IOHEXOL 300 MG/ML  SOLN COMPARISON:  05/17/2018, 01/25/2014 FINDINGS: Cardiovascular: Atherosclerosis of the thoracic aorta. No significant aneurysm or dissection. Three-vessel arch anatomy appearing patent. No large central pulmonary embolus or saddle embolus. Native coronary atherosclerosis noted. Aortic valve calcifications present. Heart is normal in size. No pericardial effusion. Mediastinum/Nodes:  Abnormal enhancing left hilar, subcarinal, and suspect paraesophageal mediastinal adenopathy. Right hilar adenopathy measures 3.1 x 1.7 cm, image 70. No thyroid abnormality. Trachea remains patent. Central bronchi are patent. No mediastinal hemorrhage or hematoma. Enhancing nodular abnormal soft tissue along the left anterior mediastinum medially and also along the left pericardium diffusely compatible with mediastinal/pleural and likely pericardial tumor involvement. Lungs/Pleura: Background peripheral interstitial fibrotic pattern with basilar honeycombing. Similar areas of apical nodular spiculation and central cavitation compatible with scarring when compared to 2015. Large central left lung mass has obscured margins but roughly measures 6.8 x 8 cm, image 81 series 2. Again, the lung mass appears to be contiguous with abnormal soft tissue invading the left hilum along the pulmonary arteries, left upper and lower lobe bronchi, as well as the left superior and inferior pulmonary veins. Loculated heterogeneous large left pleural effusion with diffuse pleural enhancement and pleural enhancing nodularity inferiorly. Chest wall nodularity also noted posteriorly, image 112 series 2. Constellation of findings are compatible with central bronchogenic left lung cancer invading the mediastinum with mediastinal and hilar adenopathy as well as diffuse left hemithorax pleural and chest wall metastatic disease. This would be compatible with a stage IV lung cancer. Right lung demonstrates scattered parenchymal scarring and peripheral interstitial fibrosis with basilar honeycombing. Small right pleural effusion noted dependently without loculation. Associated right basilar atelectasis. Upper Abdomen: Postop changes from partial gastrectomy. No upper abdominal adenopathy. Remote cholecystectomy. No acute upper abdominal finding. Musculoskeletal: Limited with motion artifact. Bones are osteopenic. Degenerative changes of the spine  and both shoulders. Posterior right tenth and eleventh rib fractures noted. Chronic vertebral plana at T12. No acute compression fracture. Motion artifact across the manubrium and sternum. IMPRESSION: Large obscured left central lung mass measuring up to 8 cm compatible with bronchogenic lung carcinoma with evidence left hilar and mediastinal adenopathy, left hilar central invasion, possible involvement of the left pericardium, and diffuse left hemithorax pleural metastatic nodularity and chest wall nodularity. Associated partial loculated left effusion appearing complex which may be related to the malignant process or residual hemothorax. Chronic background pulmonary interstitial fibrosis Trace right pleural effusion and associated basilar atelectasis Posterior right tenth and eleventh rib fractures Thoracic aortic atherosclerosis and native coronary atherosclerosis These results will be called to the ordering clinician or representative by the Radiologist Assistant, and communication documented in the PACS or zVision Dashboard. Aortic Atherosclerosis (ICD10-I70.0). Electronically Signed   By: Jerilynn Mages.  Shick M.D.   On: 05/18/2018 13:22   US Thoracentesis Asp Pleural Space W/img Guide  Result Date: 05/17/2018 INDICATION: Patient with history of CHF, gastric cancer 2014, hypoxia, recurrent left pleural effusion. Request made for diagnostic and therapeutic left thoracentesis. EXAM: ULTRASOUND GUIDED DIAGNOSTIC AND THERAPEUTIC LEFT THORACENTESIS MEDICATIONS: None COMPLICATIONS: None immediate. PROCEDURE: An ultrasound guided thoracentesis was thoroughly discussed with the patient and questions answered.  The benefits, risks, alternatives and complications were also discussed. The patient understands and wishes to proceed with the procedure. Written consent was obtained. Ultrasound was performed to localize and mark an adequate pocket of fluid in the left chest. The area was then prepped and draped in the normal sterile  fashion. 1% Lidocaine was used for local anesthesia. Under ultrasound guidance a 6 Fr Safe-T-Centesis catheter was introduced. Thoracentesis was performed. The catheter was removed and a dressing applied. FINDINGS: A total of approximately 1.3 liters of dark, bloody fluid was removed. Samples were sent to the laboratory as requested by the clinical team. Due to loculated nature of collection and patient's hypotension only the above amount of fluid was removed today. IMPRESSION: Successful ultrasound guided diagnostic and therapeutic left thoracentesis yielding 1.3 liters of pleural fluid. Read by: Rowe Robert, PA-C Electronically Signed   By: Markus Daft M.D.   On: 05/17/2018 16:51    Scheduled Meds: . ferrous sulfate  325 mg Oral Daily  . levothyroxine  50 mcg Oral Q0600  . simvastatin  40 mg Oral QHS  . tamsulosin  0.4 mg Oral QHS   Continuous Infusions: PRN Meds: acetaminophen **OR** acetaminophen, ondansetron **OR** ondansetron (ZOFRAN) IV  Time spent: 35 minutes  Author: Berle Mull, MD [facility] Pager: [phone] 05/18/2018 4:10 PM  If 7PM-7AM, please contact night-coverage at [email or web address], password [facility]

## 2018-05-18 NOTE — Progress Notes (Signed)
CRITICAL VALUE ALERT  Critical Value:  Hgb 6.2  Date & Time Notied:  05/18/2018 0720  Provider Notified: Dr. Posey Pronto  Orders Received/Actions taken: orders placed by MD

## 2018-05-18 NOTE — Progress Notes (Signed)
Patient BP 82/59. Patient asymptomatic. NP on call notified. New order placed.   0015: Rechecked BP after 250 NS bolus 85/53. NP on call notified. New orders placed

## 2018-05-18 NOTE — Clinical Social Work Note (Signed)
Clinical Social Work Assessment  Patient Details  Name: Kyle Armstrong MRN: [id number] Date of Birth: September 20, 1930  Date of referral:  05/18/18               Reason for consult:  Facility Placement, Discharge Planning                Permission sought to share information with:  Facility Sport and exercise psychologist, Family Supports Permission granted to share information::  Yes, Verbal Permission Granted  Name::     [place]::     Relationship::     Contact Information:  [phone] home [phone] cell  Housing/Transportation Living arrangements for the past 2 months:  [place], Single Family Home(Patient has been at [facility] for ST rehab) Source of Information:  Patient, Adult Children Patient Interpreter Needed:  None Criminal Activity/Legal Involvement Pertinent to Current Situation/Hospitalization:  No - Comment as needed Significant Relationships:  Adult Children Lives with:  Self Do you feel safe going back to the place where you live?  (Pending evaluation from PT) Need for family participation in patient care:  Yes (Comment)  Care giving concerns:  Patient admitted from [facility],where patient was receiving ST rehab. Patient reported that he has been at SNF approx. 10 days and is supposed to stay approx. 20 days. Patient's son reported that patient needed some assistance with ambulation and ADLs prior to admission to SNF for ST rehab. PT consulted, evaluation pending.    Social Worker assessment / plan:  CSW spoke with patient at bedside regarding discharge planning. Patient's reported that he has been at [facility] for approx. 10 days and thinks he will stay approx. 20 days total. CSW explained that PT has been consulted and will evaluate patient and make a recommendation on follow up level of care. Patient reported that he is agreeable to return to [facility] for ST rehab if that is the recommendation. Patient granted CSW verbal permission  to speak with his son to discuss discharge planning further.  CSW contacted patient's son Yukio Bisping) and discussed patient's discharge planning. Patient's son reported that patient's discharge plan will depend on PT recommendation. CSW agreed to follow up with patient/patient's son after PT evaluation is completed and recommendation is made.   PT consulted, evaluation pending.   CSW will continue to follow and assist with discharge planning.  Employment status:  Retired Nurse, adult PT Recommendations:  Not assessed at this time Information / Referral to community resources:  Other (Comment Required)(Patient admitted from [facility] (Short term rehab))  Patient/Family's Response to care:  Patient/patient's son appreciative of CSW assistance with discharge planning.   Patient/Family's Understanding of and Emotional Response to Diagnosis, Current Treatment, and Prognosis:  Patient presented calm and verbalized understanding of current treatment plan. Patient agreeable to return to [facility] if recommended to complete ST rehab.   Emotional Assessment Appearance:  Appears stated age Attitude/Demeanor/Rapport:  Other(Cooperative) Affect (typically observed):  Appropriate, Calm Orientation:  Oriented to Self, Oriented to Place, Oriented to  Time, Oriented to Situation Alcohol / Substance use:  Not Applicable Psych involvement (Current and /or in the community):  No (Comment)  Discharge Needs  Concerns to be addressed:  Care Coordination Readmission within the last 30 days:  No Current discharge risk:    Barriers to Discharge:  Continued Medical Work up   [employer], LCSW 05/18/2018, 2:07 PM

## 2018-05-18 NOTE — Progress Notes (Signed)
OT Cancellation Note  Patient Details Name: Kyle Armstrong MRN: [id number] DOB: 1930/06/04   Cancelled Treatment:    Reason Eval/Treat Not Completed: Patient at procedure or test/ unavailable  Will check on pt next day.  Kari Baars, [place]  Payton Mccallum D 05/18/2018, 1:39 PM

## 2018-05-19 DIAGNOSIS — Z87891 Personal history of nicotine dependence: Secondary | ICD-10-CM

## 2018-05-19 DIAGNOSIS — Z515 Encounter for palliative care: Secondary | ICD-10-CM

## 2018-05-19 DIAGNOSIS — M549 Dorsalgia, unspecified: Secondary | ICD-10-CM

## 2018-05-19 DIAGNOSIS — C163 Malignant neoplasm of pyloric antrum: Secondary | ICD-10-CM

## 2018-05-19 DIAGNOSIS — D869 Sarcoidosis, unspecified: Secondary | ICD-10-CM

## 2018-05-19 DIAGNOSIS — Z7189 Other specified counseling: Secondary | ICD-10-CM

## 2018-05-19 DIAGNOSIS — E785 Hyperlipidemia, unspecified: Secondary | ICD-10-CM

## 2018-05-19 DIAGNOSIS — R591 Generalized enlarged lymph nodes: Secondary | ICD-10-CM

## 2018-05-19 DIAGNOSIS — Z86010 Personal history of colonic polyps: Secondary | ICD-10-CM

## 2018-05-19 DIAGNOSIS — R918 Other nonspecific abnormal finding of lung field: Secondary | ICD-10-CM

## 2018-05-19 DIAGNOSIS — G8929 Other chronic pain: Secondary | ICD-10-CM

## 2018-05-19 DIAGNOSIS — J90 Pleural effusion, not elsewhere classified: Secondary | ICD-10-CM

## 2018-05-19 DIAGNOSIS — D62 Acute posthemorrhagic anemia: Secondary | ICD-10-CM

## 2018-05-19 DIAGNOSIS — Z9889 Other specified postprocedural states: Secondary | ICD-10-CM

## 2018-05-19 DIAGNOSIS — E039 Hypothyroidism, unspecified: Secondary | ICD-10-CM

## 2018-05-19 LAB — BASIC METABOLIC PANEL
Anion gap: 8 (ref 5–15)
BUN: 23 mg/dL (ref 8–23)
CALCIUM: 8.5 mg/dL — AB (ref 8.9–10.3)
CO2: 30 mmol/L (ref 22–32)
Chloride: 101 mmol/L (ref 98–111)
Creatinine, Ser: 0.87 mg/dL (ref 0.61–1.24)
GFR calc Af Amer: >60 mL/min (ref >60)
GFR calc non Af Amer: >60 mL/min (ref >60)
GLUCOSE: 105 mg/dL — AB (ref 70–99)
Potassium: 4.1 mmol/L (ref 3.5–5.1)
Sodium: 139 mmol/L (ref 135–145)

## 2018-05-19 LAB — CBC WITH DIFFERENTIAL/PLATELET
BASOS PCT: 0 %
Basophils Absolute: 0 10*3/uL (ref 0.0–0.1)
Eosinophils Absolute: 0.3 10*3/uL (ref 0.0–0.7)
Eosinophils Relative: 4 %
HCT: 23.7 % — ABNORMAL LOW (ref 39.0–52.0)
Hemoglobin: 7.4 g/dL — ABNORMAL LOW (ref 13.0–17.0)
LYMPHS ABS: 0.5 10*3/uL — AB (ref 0.7–4.0)
Lymphocytes Relative: 7 %
MCH: 29.6 pg (ref 26.0–34.0)
MCHC: 31.2 g/dL (ref 30.0–36.0)
MCV: 94.8 fL (ref 78.0–100.0)
MONO ABS: 0.6 10*3/uL (ref 0.1–1.0)
Monocytes Relative: 8 %
Neutro Abs: 6.3 10*3/uL (ref 1.7–7.7)
Neutrophils Relative %: 81 %
PLATELETS: 261 10*3/uL (ref 150–400)
RBC: 2.5 MIL/uL — ABNORMAL LOW (ref 4.22–5.81)
RDW: 17.2 % — AB (ref 11.5–15.5)
WBC: 7.7 10*3/uL (ref 4.0–10.5)

## 2018-05-19 LAB — PH, BODY FLUID: PH, BODY FLUID: 7

## 2018-05-19 LAB — HEMOGLOBIN AND HEMATOCRIT, BLOOD
HCT: 30.2 % — ABNORMAL LOW (ref 39.0–52.0)
Hemoglobin: 9.8 g/dL — ABNORMAL LOW (ref 13.0–17.0)

## 2018-05-19 LAB — MAGNESIUM: Magnesium: 1.8 mg/dL (ref 1.7–2.4)

## 2018-05-19 LAB — PREPARE RBC (CROSSMATCH)

## 2018-05-19 MED ORDER — GI COCKTAIL ~~LOC~~
30.0000 mL | Freq: Once | ORAL | Status: AC
  Administered 2018-05-19: 30 mL via ORAL
  Filled 2018-05-19: qty 30

## 2018-05-19 MED ORDER — SODIUM CHLORIDE 0.9% IV SOLUTION
Freq: Once | INTRAVENOUS | Status: AC
  Administered 2018-05-19: 12:00:00 via INTRAVENOUS

## 2018-05-19 MED ORDER — ACETAMINOPHEN 325 MG PO TABS
650.0000 mg | ORAL_TABLET | Freq: Once | ORAL | Status: AC
  Administered 2018-05-19: 650 mg via ORAL
  Filled 2018-05-19: qty 2

## 2018-05-19 MED ORDER — DIPHENHYDRAMINE HCL 50 MG/ML IJ SOLN
12.5000 mg | Freq: Once | INTRAMUSCULAR | Status: AC
  Administered 2018-05-19: 12.5 mg via INTRAVENOUS
  Filled 2018-05-19: qty 1

## 2018-05-19 MED ORDER — LIDOCAINE 5 % EX PTCH
1.0000 | MEDICATED_PATCH | CUTANEOUS | Status: DC
  Administered 2018-05-19 – 2018-05-24 (×6): 1 via TRANSDERMAL
  Filled 2018-05-19 (×7): qty 1

## 2018-05-19 MED ORDER — ACETAMINOPHEN 325 MG PO TABS
650.0000 mg | ORAL_TABLET | Freq: Four times a day (QID) | ORAL | Status: DC
  Administered 2018-05-19 – 2018-05-25 (×22): 650 mg via ORAL
  Filled 2018-05-19 (×23): qty 2

## 2018-05-19 NOTE — Progress Notes (Signed)
OT Cancellation Note  Patient Details Name: Kyle Armstrong MRN: [id number] DOB: 1930-05-28   Cancelled Treatment:     Pt fatigued after sitting in the chair.  OT will check on pt later in day or next day.  Kari Baars, [place]  Payton Mccallum D 05/19/2018, 12:49 PM

## 2018-05-19 NOTE — Consult Note (Signed)
Consultation Note Date: 05/19/2018   Patient Name: Kyle Armstrong  DOB: [birth year]  MRN: [id number]  Age / Sex: 84 y.o., male  PCP: Kyle Cruel, MD Referring Physician: Eugenie Filler, MD  Reason for Consultation: Establishing goals of care  HPI/Patient Profile: 84 y.o. male  with past medical history of CHF, hyperlipidemia, BPH, gastric cancer, s/p recent hospitalization 5/18-5/24 for CHF (w/ L sided pleural effusion req thoracentesis) and d/c to [facility] for rehab- admitted on 05/17/2018 with hypoxia. Workup reveals large pleural effusion, large L lung mass (8cm, invading hilum, possible pericardial involvement) s/p thoracentesis 1.5 L with improvement in respiratory status. Posterior tenth and 11th rib fractures also noted on CT scan. Palliative medicine consulted for Kyle Armstrong.    Clinical Assessment and Goals of Care:  I have reviewed medical records including EPIC notes, labs and imaging, assessed the patient and then met at the bedside along with the patient to discuss diagnosis prognosis, GOC, EOL wishes, disposition and options.  I introduced Palliative Medicine as specialized medical care for people living with serious illness. It focuses on providing relief from the symptoms and stress of a serious illness. The goal is to improve quality of life for both the patient and the family.  We discussed a brief life review of the patient. He was married for 18 years before his spouse died from a stroke. He enjoys socializing and telling jokes.   As far as functional and nutritional status - he has noted significant decline over the last several months. He can barely walk at this point. Uses a Rolling Walker. Was able to sit in a chair today. He has had decline in his appetite and per phone discussion with his daughter he has been losing weight.   We discussed their current illness and what it  means in the larger context of their on-going co-morbidities.  Natural disease trajectory and expectations at EOL were discussed. Kyle Armstrong states he has lived a full life and when it is his time to go he is ready. He doesn't want his life to be prolonged unnecessarily, but he doesn't think his time is here just yet. He states he is going to live to be [age] to be shot dead by a jealous husband. We did discuss the fact that he might have another cancer and what would his desire be regarding to treat or not. He has concerns about his ability to tolerate more treatment. He previously underwent treatment for gastric cancer which he has recovered from, but his functional status remains poor.  He says at this point he would want to first find out if he has cancer or not and then discuss it with his kids.  He would not want long term ventilation and in fact had previously had a DNR at his nursing facility. We discussed DNR status and he would like DNR status implemented now. He understands this doesn't change his current treatment plan- it means that only if he were to be found without a  pulse or not breathing, the CPR would not be initiated.    I attempted to elicit values and goals of care important to the patient. His family are very important to him.     The difference between aggressive medical intervention and comfort care was considered in light of the patient's goals of care. We discussed that he does have agency and choice over what treatments and tests he wants to pursue. For now he does not choose comfort care, but isn't certain how aggressive he wants to bed. He wishes to discuss this with his family further.   Hospice and Palliative Care services outpatient were explained and offered.  Questions and concerns were addressed.   In addition to my conversation with patient I discussed above with patient's daughterMargarita Armstrong. Kyle Armstrong states that she believes patient would not choose treatment for  cancer and would want Hospice. We discussed options for Hospice including hospice at home vs residential hospice. I reviewed with her my conversation with patient above and his [place] and encouraged her to have conversation with him. She also has a brother who can be involved in discussions as well. Neither or them are available to be present until Friday.   Primary Decision Maker PATIENT- and daughter- Kyle Armstrong  -DNR- WITH FULL SCOPE TREATMENT -Full scope treament -Await cytology results from thoracentesis for further GOC decisions -Will schedule Tylenol 660m q6hr for low back pain- this is likely resulting from rib fractures, will also order lidoderm patches -F/U GOC meeting planned for Friday 6/28 at 1500 so patient's son can be present    Code Status/Advance Care Planning:  DNR  Palliative Prophylaxis:   Frequent Pain Assessment  Additional Recommendations (Limitations, Scope, Preferences):  Full Scope Treatment  Prognosis:    Unable to determine  Discharge Planning: To Be Determined  Primary Diagnoses: Present on Admission: . Acute hypoxemic respiratory failure ([facility] . Hypothyroidism . Hyperlipidemia   I have reviewed the medical record, interviewed the patient and family, and examined the patient. The following aspects are pertinent.  Past Medical History:  Diagnosis Date  . Allergy   . Arthritis   . Eczema   . Gastric cancer ([facility] 10/24/2013  . Hx of radiation therapy 11/10/13-12/21/13   gastric ca, total 50.4Gy  . Hyperlipidemia   . Hypothyroidism   . Lazy eye of right side   . Pernicious anemia   . 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 Marital status: Widowed    Spouse name: Not on file  . Number of children: 2  . Years of education: Not on file  . Highest education level: Not on file  Occupational History  . Not on file  Social  Needs  . Financial resource strain: Not on file  . Food insecurity:    Worry: Not on file    Inability: Not on file  . Transportation needs:    Medical: Not on file    Non-medical: Not on file  Tobacco Use  . Smoking status: Former Smoker    Packs/day: 1.00    Years: 30.00    Pack years: 30.00    Types: Cigarettes    Last attempt to quit: 11/24/1990    Years since quitting: 27.5  . Smokeless tobacco: Never Used  Substance and Sexual Activity  . Alcohol use: No  . Drug use: No  . Sexual activity: Never  Lifestyle  . Physical activity:    Days per week: Not on file    Minutes per session: Not on file  . Stress: Not on file  Relationships  . Social connections:    Talks on phone: Not on file    Gets together: Not on file    Attends religious service: Not on file    Active member of club or organization: Not on file    Attends meetings of clubs or organizations: Not on file    Relationship status: Not on file  Other Topics Concern  . Not on file  Social History Narrative   Widowed, wife died in Jan 13, 2011 (prior breast cancer patient)   Retired from [employer]   Currently works 3 days/week (4 hours) at [employer] alone, drives   Family History  Problem Relation Age of Onset  . Heart attack Mother   . Heart attack Father   . Hypertension Unknown    Scheduled Meds: . sodium chloride   Intravenous Once  . ferrous sulfate  325 mg Oral Daily  . levothyroxine  50 mcg Oral Q0600  . simvastatin  40 mg Oral QHS  . tamsulosin  0.4 mg Oral QHS   Continuous Infusions: PRN Meds:.acetaminophen **OR** acetaminophen, ondansetron **OR** ondansetron (ZOFRAN) IV Medications Prior to Admission:  Prior to Admission medications   Medication Sig Start Date End Date Taking? Authorizing Provider  acetaminophen (TYLENOL) 500 MG tablet Take 1,000 mg by mouth every 8 (eight) hours as needed (For pain.).   Yes [provider]  albuterol (PROVENTIL HFA;VENTOLIN HFA) 108 (90 Base) MCG/ACT  inhaler Inhale 2 puffs into the lungs every 6 (six) hours as needed for wheezing or shortness of breath. 04/16/18  Yes Emokpae, Courage, MD  Amino Acids-Protein Hydrolys (FEEDING SUPPLEMENT, PRO-STAT SUGAR FREE 64,) LIQD Take 30 mLs by mouth daily.   Yes [provider]  aspirin 81 MG chewable tablet Chew 81 mg by mouth daily.   Yes [provider]  cyanocobalamin (,VITAMIN B-12,) 1000 MCG/ML injection Inject 1,000 mcg into the muscle every 30 (thirty) days.   Yes [provider]  ferrous sulfate 325 (65 FE) MG EC tablet Take 325 mg by mouth daily.  06/16/14  Yes Ladell Pier, MD  furosemide (LASIX) 40 MG tablet Take 40 mg by mouth.   Yes [provider]  levothyroxine (SYNTHROID, LEVOTHROID) 50 MCG tablet Take 1 tablet (50 mcg total) by mouth daily before breakfast. 04/16/18  Yes Emokpae, Courage, MD  metoprolol tartrate (LOPRESSOR) 25 MG tablet Take 0.5 tablets (12.5 mg total) by mouth daily. Patient taking differently: Take 12.5 mg by mouth 2 (two) times daily. Hold if SBP is less than 100 or DBP is less than 50. 04/23/18  Yes Fay Records, MD  potassium chloride SA (K-DUR,KLOR-CON) 20 MEQ tablet Take 1 tablet (20 mEq total) by mouth daily. 06/26/17  Yes Rai, Ripudeep K, MD  simvastatin (ZOCOR) 40 MG tablet Take 1 tablet (40 mg total) by mouth every evening. Patient taking differently: Take 40 mg by mouth at bedtime.  04/16/18  Yes Emokpae, Courage, MD  tamsulosin (FLOMAX) 0.4 MG CAPS capsule Take 1 capsule (0.4 mg total) by mouth daily. Patient taking differently: Take 0.4 mg by mouth at bedtime.  04/16/18  Yes Roxan Hockey, MD   Allergies  Allergen Reactions  . Lipitor [Atorvastatin] Other (See Comments)    Leg cramps  . Amoxicillin Rash and Other (See Comments)    Has patient had  a PCN reaction causing immediate rash, facial/tongue/throat swelling, SOB or lightheadedness with hypotension: yes Has patient had a PCN reaction causing severe rash involving  mucus membranes or skin necrosis: no Has patient had a PCN reaction that required hospitalization: unknown Has patient had a PCN reaction occurring within the last 10 years: unknown If all of the above answers are "NO", then may proceed with Cephalosporin use.   . Augmentin [Amoxicillin-Pot Clavulanate] Rash and Other (See Comments)    Has patient had a PCN reaction causing immediate rash, facial/tongue/throat swelling, SOB or lightheadedness with hypotension: yes Has patient had a PCN reaction causing severe rash involving mucus membranes or skin necrosis: no Has patient had a PCN reaction that required hospitalization: unknown Has patient had a PCN reaction occurring within the last 10 years: unknown If all of the above answers are "NO", then may proceed with Cephalosporin use.    Review of Systems  Constitutional: Positive for activity change, appetite change and fatigue.  Respiratory: Positive for cough and shortness of breath.   Cardiovascular: Negative for chest pain.  Musculoskeletal: Positive for back pain.  Neurological: Positive for weakness.  Psychiatric/Behavioral: Negative for dysphoric mood and sleep disturbance.    Physical Exam  Constitutional: He is oriented to person, place, and time. He appears well-developed.  frail  Cardiovascular: Normal rate and regular rhythm.  Pulmonary/Chest:  Labored, decreased  Musculoskeletal: Normal range of motion.  Neurological: He is alert and oriented to person, place, and time.  [facility]  Nursing note and vitals reviewed.   Vital Signs: BP 106/71   Pulse 61   Temp 98 F (36.7 C) (Oral)   Resp 18   Ht _0  (1.702 m)   Wt 62.9 kg (138 lb 10.7 oz)   SpO2 99%   BMI 21.72 kg/m  Pain Scale: PAINAD   Pain Score: Asleep   SpO2: SpO2: 99 % O2 Device:SpO2: 99 % O2 Flow Rate: .O2 Flow Rate (L/min): 2 L/min  IO: Intake/output summary:   Intake/Output Summary (Last 24 hours) at 05/19/2018 1312 Last data filed at 05/19/2018  0800 Gross per 24 hour  Intake 338.33 ml  Output 325 ml  Net 13.33 ml    LBM: Last BM Date: 05/18/18 Baseline Weight: Weight: 62.4 kg (137 lb 8 oz) Most recent weight: Weight: 62.9 kg (138 lb 10.7 oz)     Palliative Assessment/Data: PPS: 40%     Thank you for this consult. Palliative medicine will continue to follow and assist as needed.   Time In:1300 Time Out: 1500 Time Total: 120 minutes Prolonged services billed; yes Greater than 50%  of this time was spent counseling and coordinating care related to the above assessment and plan.  Signed by: Mariana Kaufman, AGNP-C Palliative Medicine    Please contact Palliative Medicine Team phone at [phone] for questions and concerns.  For individual provider: See Shea Evans

## 2018-05-19 NOTE — Progress Notes (Signed)
IP PROGRESS NOTE  Subjective:   Kyle Armstrong is known to me with a remote history of gastric cancer.  He was admitted on 04/10/2018 with dyspnea.  A chest x-ray revealed a large left pleural effusion.  He underwent a left thoracentesis for 1.8 L of bloody fluid on 04/13/2018.  A culture was negative.  The cytology revealed no malignant cells. He was diagnosed with systolic heart failure.  He was discharged to a skilled nursing facility on 04/16/2018.  He presented to the emergency room 05/17/2018 after he was noted to have hypoxia at the skilled nursing facility.  He denies fever and cough.  A chest x-ray revealed near total opacification of the left hemithorax.  A thoracentesis on 05/17/2018 yielded 1.3 L of bloody fluid.  Cytology is pending.  A CT of the chest on 05/18/2018 revealed a left central lung mass with left hilar mediastinal adenopathy.  There may be involvement of the pericardium by tumor.  There is pleural nodularity.  There is a partially loculated left effusion.  There is a background of interstitial fibrosis.  There are posterior right 10th and 11th rib fractures.  Kyle Armstrong complains of back pain.  He is unable to ambulate secondary to pain. Objective: Vital signs in last 24 hours: Blood pressure (!) 91/56, pulse 76, temperature 98.2 F (36.8 C), temperature source Oral, resp. rate 18, height '5\' 7"'$  (1.702 m), weight 138 lb 10.7 oz (62.9 kg), SpO2 99 %.  Intake/Output from previous day: 06/25 0701 - 06/26 0700 In: 338.3 [Blood:338.3] Out: 225 [Urine:225]  Physical Exam:  HEENT: Edentulous, no thrush Lungs: Inspiratory rales at the right lower chest and throughout the left chest, decreased breath sounds at the left lower chest Cardiac: Regular rate and rhythm Abdomen: No hepatosplenomegaly, no mass, nontender Extremities: No leg edema Neurologic: Alert, follows commands, the motor exam appears intact in the upper and lower extremities Musculoskeletal: Tender at the upper  lumbar/lower thoracic spine   Lab Results: Recent Labs    05/18/18 0927 05/18/18 1557  WBC 7.5 8.5  HGB 6.4* 7.7*  HCT 20.4* 24.1*  PLT 275 268    BMET Recent Labs    05/17/18 1229 05/17/18 1246 05/18/18 0522  NA 137 136 138  K 4.2 4.1 4.5  CL 96* 93* 102  CO2 31  --  31  GLUCOSE 118* 112* 99  BUN 34* 31* 31*  CREATININE 1.10 1.00 0.93  CALCIUM 9.0  --  8.1*    No results found for: CEA1  Studies/Results: Dg Chest 1 View  Result Date: 05/17/2018 CLINICAL DATA:  Status post LEFT thoracentesis. EXAM: CHEST  1 VIEW COMPARISON:  Portable film earlier in the day. FINDINGS: Marked decrease LEFT pleural effusion. No visible pneumothorax. Cardiac enlargement, BILATERAL interstitial prominence is redemonstrated stable. Marked osseous degenerative change, both shoulders. IMPRESSION: Marked decreased LEFT pleural effusion post thoracentesis. No pneumothorax. Electronically Signed   By: Staci Righter M.D.   On: 05/17/2018 16:55   Dg Chest 2 View  Result Date: 05/17/2018 CLINICAL DATA:  Shortness of breath. History of left-sided pneumonia. EXAM: CHEST - 2 VIEW COMPARISON:  Portable chest x-ray of Apr 13, 2018 FINDINGS: There is near-total opacification of the left hemithorax. Only a small amount of aerated lung is noted in the apex. There is slight shift of the mediastinum toward the right. The interstitial markings of the right lung are coarse and slightly more conspicuous today. There is no definite right pleural effusion. The left heart border is obscured. The  right heart border appears normal. There is severe degenerative change of the left shoulder. IMPRESSION: New near-total opacification of the left hemithorax compatible with large pleural effusion and likely left lung atelectasis. The possibility of a central obstructing lesion is raised. Slight mediastinal shift toward the right. Mildly increased interstitial densities throughout the right lung may reflect edema or less likely  pneumonia. Electronically Signed   By: David  Martinique M.D.   On: 05/17/2018 13:53   Ct Chest W Contrast  Result Date: 05/18/2018 CLINICAL DATA:  History of CHF, remote gastric cancer, hypoxia, recurrent bloody left effusion status post thoracentesis twice EXAM: CT CHEST WITH CONTRAST TECHNIQUE: Multidetector CT imaging of the chest was performed during intravenous contrast administration. CONTRAST:  42m OMNIPAQUE IOHEXOL 300 MG/ML  SOLN COMPARISON:  05/17/2018, 01/25/2014 FINDINGS: Cardiovascular: Atherosclerosis of the thoracic aorta. No significant aneurysm or dissection. Three-vessel arch anatomy appearing patent. No large central pulmonary embolus or saddle embolus. Native coronary atherosclerosis noted. Aortic valve calcifications present. Heart is normal in size. No pericardial effusion. Mediastinum/Nodes: Abnormal enhancing left hilar, subcarinal, and suspect paraesophageal mediastinal adenopathy. Right hilar adenopathy measures 3.1 x 1.7 cm, image 70. No thyroid abnormality. Trachea remains patent. Central bronchi are patent. No mediastinal hemorrhage or hematoma. Enhancing nodular abnormal soft tissue along the left anterior mediastinum medially and also along the left pericardium diffusely compatible with mediastinal/pleural and likely pericardial tumor involvement. Lungs/Pleura: Background peripheral interstitial fibrotic pattern with basilar honeycombing. Similar areas of apical nodular spiculation and central cavitation compatible with scarring when compared to 2015. Large central left lung mass has obscured margins but roughly measures 6.8 x 8 cm, image 81 series 2. Again, the lung mass appears to be contiguous with abnormal soft tissue invading the left hilum along the pulmonary arteries, left upper and lower lobe bronchi, as well as the left superior and inferior pulmonary veins. Loculated heterogeneous large left pleural effusion with diffuse pleural enhancement and pleural enhancing nodularity  inferiorly. Chest wall nodularity also noted posteriorly, image 112 series 2. Constellation of findings are compatible with central bronchogenic left lung cancer invading the mediastinum with mediastinal and hilar adenopathy as well as diffuse left hemithorax pleural and chest wall metastatic disease. This would be compatible with a stage IV lung cancer. Right lung demonstrates scattered parenchymal scarring and peripheral interstitial fibrosis with basilar honeycombing. Small right pleural effusion noted dependently without loculation. Associated right basilar atelectasis. Upper Abdomen: Postop changes from partial gastrectomy. No upper abdominal adenopathy. Remote cholecystectomy. No acute upper abdominal finding. Musculoskeletal: Limited with motion artifact. Bones are osteopenic. Degenerative changes of the spine and both shoulders. Posterior right tenth and eleventh rib fractures noted. Chronic vertebral plana at T12. No acute compression fracture. Motion artifact across the manubrium and sternum. IMPRESSION: Large obscured left central lung mass measuring up to 8 cm compatible with bronchogenic lung carcinoma with evidence left hilar and mediastinal adenopathy, left hilar central invasion, possible involvement of the left pericardium, and diffuse left hemithorax pleural metastatic nodularity and chest wall nodularity. Associated partial loculated left effusion appearing complex which may be related to the malignant process or residual hemothorax. Chronic background pulmonary interstitial fibrosis Trace right pleural effusion and associated basilar atelectasis Posterior right tenth and eleventh rib fractures Thoracic aortic atherosclerosis and native coronary atherosclerosis These results will be called to the ordering clinician or representative by the Radiologist Assistant, and communication documented in the PACS or zVision Dashboard. Aortic Atherosclerosis (ICD10-I70.0). Electronically Signed   By: MJerilynn Mages   Shick M.D.   On: 05/18/2018  13:22   US Thoracentesis Asp Pleural Space W/img Guide  Result Date: 05/17/2018 INDICATION: Patient with history of CHF, gastric cancer 2014, hypoxia, recurrent left pleural effusion. Request made for diagnostic and therapeutic left thoracentesis. EXAM: ULTRASOUND GUIDED DIAGNOSTIC AND THERAPEUTIC LEFT THORACENTESIS MEDICATIONS: None COMPLICATIONS: None immediate. PROCEDURE: An ultrasound guided thoracentesis was thoroughly discussed with the patient and questions answered. The benefits, risks, alternatives and complications were also discussed. The patient understands and wishes to proceed with the procedure. Written consent was obtained. Ultrasound was performed to localize and mark an adequate pocket of fluid in the left chest. The area was then prepped and draped in the normal sterile fashion. 1% Lidocaine was used for local anesthesia. Under ultrasound guidance a 6 Fr Safe-T-Centesis catheter was introduced. Thoracentesis was performed. The catheter was removed and a dressing applied. FINDINGS: A total of approximately 1.3 liters of dark, bloody fluid was removed. Samples were sent to the laboratory as requested by the clinical team. Due to loculated nature of collection and patient's hypotension only the above amount of fluid was removed today. IMPRESSION: Successful ultrasound guided diagnostic and therapeutic left thoracentesis yielding 1.3 liters of pleural fluid. Read by: Rowe Robert, PA-C Electronically Signed   By: Markus Daft M.D.   On: 05/17/2018 16:51    Medications: I have reviewed the patient's current medications.  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   Initiation radiation and concurrent Xeloda 11/10/2013, completed 12/21/2013   Restaging CTs of the chest, abdomen, and pelvis on 01/25/2014 with persistent antral thickening, no evidence of  metastatic disease.   Distal gastrectomy with Billroth II anastomosis 02/15/2014 (y pT1b, y pN0).  CT scans 03/14/2016 for evaluation of mid abdominal and back pain-no acute findings within the abdomen or pelvis. Postoperative changes identified compatible distal gastrectomy with gastrojejunostomy.  2. Diffuse "gastritis "change noted on endoscopies 08/19/2013 and 10/06/2013 with biopsies confirming atrophic gastritis. Low and high-grade dysplasia were noted on the biopsy 08/19/2013  3. History of Anemia-likely secondary to GI blood loss, surgery, chemotherapy/radiation, and malnutrition 4. History of anorexia/weight loss.  5. History of colon polyp.  6. History of "sarcoidosis", pulmonary fibrosis noted on the chest CT 10/27/2013.  7. History of tobacco use.  8. History of arrhythmia, status post an ablation August 2013.  9.ChronicBack pain-most likely secondary to a benign musculoskeletal condition 10.  Admission 05/17/2018 with respiratory failure  Left pleural effusion on chest x-ray 04/10/2018, left thoracentesis 04/13/2018-bloody fluid, negative cytology  Large left effusion on admission chest x-ray, cytology pending  CT chest 05/18/2018- central left lung mass, left hilar/mediastinal adenopathy, left pleural nodularity   Kyle Armstrong is admitted with respiratory failure.  A chest CT confirms a left lung mass, loculated left pleural effusion, and chest lymphadenopathy.  He most likely has lung cancer.  I discussed the differential diagnosis with Kyle Armstrong.  He has a remote history of gastric cancer and it is possible the current presentation is related to gastric cancer.  We will follow-up on the pleural fluid cytology.  If nondiagnostic he will need a bronchoscopy.  Recommendations: 1.  Follow-up cytology from the left pleural fluid, if negative consult pulmonary medicine for a diagnostic bronchoscopy 2.  Additional staging evaluation when diagnosis is confirmed 3.   Additional imaging of the spine for persistent back pain  I will continue following Kyle Armstrong in the hospital and we will arrange outpatient follow-up.   LOS: 2 days  Betsy Coder, MD   05/19/2018, 6:40 AM

## 2018-05-19 NOTE — Progress Notes (Signed)
PROGRESS NOTE    Kyle Armstrong  JOA:[id number] DOB: 1930-02-09 DOA: 05/17/2018 PCP: Lawerance Cruel, MD   Brief Narrative: Pt. with PMH of chronic diastolic CHF, HLD, BPH; admitted on 05/17/2018, presented with complaint of shortness of breath, was found to have recurrent left pleural effusion secondary to left lung mass. Currently further plan is continue close monitoring and supportive measures as well as follow-up on recommendations from oncology.     Assessment & Plan:   Principal Problem:   Acute hypoxemic respiratory failure (HCC) Active Problems:   Hyperlipidemia   Hypothyroidism   Pleural effusion  .  Acute on chronic hypoxic respiratory failure. Recurrent left pleural effusion-hemothorax.  Likely malignant Left lung cancer with mediastinal mass.   prior history of gastric carcinoma treated with surgery and in remission. Baseline uses 2 L of oxygen. Status post thoracentesis with 1.3 L of bloody fluid removed 05/18/2018.  Concern for malignant pleural effusion.   Cytology as well as infectious work-up from prior thoracentesis was negative although CT of the chest performed this admission shows large central left lung mass roughly 7 x 8 cm size contiguous with left hilum mass along the pulmonary artery probably involving pleura and pericardium along with paraesophageal, bilateral hilar subcarinal adenopathy.  Improved but persistent left hemithorax compatible with stage IV lung cancer. Oncology consulted and patient seen in consultation by Dr. Benay Spice who is awaiting cytology results.  Per oncology cytology results are negative will need pulmonary evaluation for bronchoscopy. Patient noted to have systolic blood pressures in the 90s.  Hemoglobin at 7.4.  Will transfuse 2 more units of packed red blood cells.  Follow H&H. Initially thought to be fluid related to CHF and therefore patient was given IV Lasix will hold off on that right now. Even though no active bleeding  noted on the CT scan, should the patient has persistent hemodynamic instability or no improvement in anemia may benefit from IR consultation for embolization. Palliative care consultation pending for goals of care.  2.  Acute blood loss anemia. Likely secondary to bloody pleural effusion.  Status post 1 unit packed red blood cells.  Hemoglobin at 7.4.  Patient with systolic blood pressures in the low 90s.  Will transfuse 2 units packed red blood cells.  Follow H&H.  Transfusion threshold hemoglobin less than 7 or hemodynamic instability.   3.  Chronic diastolic CHF. Currently euvolemic.  Blood pressure was borderline and as such antihypertensive medications and diuretics on hold.  Will follow.   4.  Dyslipidemia. Continue statin.    5.  BPH. Stable.  Continue Flomax.   6.  Hypothyroidism. TSH was 1.372 on 04/11/2018.  Continue Synthroid.  7.  Hypertension Blood pressure borderline.  Continue to hold antihypertensive medications.  8.  Chronic interstitial fibrosis of bilateral lung parenchyma with honeycombing. Could partly be contributing to patient's hypoxia.  Follow for now.     DVT prophylaxis: SCDs Code Status: DNR Family Communication: Updated patient.  No family at bedside. Disposition Plan: To be determined.   Consultants:   Oncology: Dr. Benay Spice 05/19/2018  Palliative care pending  Procedures:   CT chest 05/18/2018  Chest x-ray 05/17/2018  Ultrasound guided thoracentesis 05/17/2018--1.3 L of dark bloody fluid removed.  Per Rowe Robert, PA 05/17/2018  2 units packed red blood cells pending 05/19/2018  1 unit packed red blood cells 05/18/2018  Antimicrobials:  IV vancomycin 624 [id number] dose IV Levaquin 624 [id number] dose   Subjective: In bed.  Somewhat hard of hearing.  States  no significant change with shortness of breath since admission.  Denies any chest pain.  Objective: Vitals:   05/18/18 1415 05/18/18 2018 05/19/18 0536 05/19/18 0950  BP: 105/67  91/68 (!) 91/56 (!) 95/58  Pulse: 88 84 76 87  Resp: (!) 22 18 18    Temp: 98 F (36.7 C) 98.3 F (36.8 C) 98.2 F (36.8 C)   TempSrc: Oral Oral Oral   SpO2: 100% 99% 99% 100%  Weight:      Height:        Intake/Output Summary (Last 24 hours) at 05/19/2018 0952 Last data filed at 05/19/2018 0800 Gross per 24 hour  Intake 338.33 ml  Output 325 ml  Net 13.33 ml   Filed Weights   05/17/18 1206 05/17/18 1741  Weight: 62.4 kg (137 lb 8 oz) 62.9 kg (138 lb 10.7 oz)    Examination:  General exam: Appears calm and comfortable  Respiratory system: Decreased breath sounds in the bases left greater than right.  No rhonchi.  Respiratory effort normal. Cardiovascular system: S1 & S2 heard, RRR. No JVD, murmurs, rubs, gallops or clicks. No pedal edema. Gastrointestinal system: Abdomen is nondistended, soft and nontender. No organomegaly or masses felt. Normal bowel sounds heard. Central nervous system: Alert and oriented. No focal neurological deficits. Extremities: Symmetric 5 x 5 power. Skin: No rashes, lesions or ulcers Psychiatry: Judgement and insight appear normal. Mood & affect appropriate.     Data Reviewed: I have personally reviewed following labs and imaging studies  CBC: Recent Labs  Lab 05/17/18 1229 05/17/18 1246 05/18/18 0522 05/18/18 0927 05/18/18 1557 05/19/18 0846  WBC 9.0  --  8.2 7.5 8.5 7.7  NEUTROABS 7.9  --   --  6.0  --  6.3  HGB 8.5* 8.8* 6.2* 6.4* 7.7* 7.4*  HCT 27.6* 26.0* 19.6* 20.4* 24.1* 23.7*  MCV 99.3  --  98.0 97.6 93.4 94.8  PLT 301  --  257 275 268 163   Basic Metabolic Panel: Recent Labs  Lab 05/17/18 1229 05/17/18 1246 05/18/18 0522 05/19/18 0846  NA 137 136 138 139  K 4.2 4.1 4.5 4.1  CL 96* 93* 102 101  CO2 31  --  31 30  GLUCOSE 118* 112* 99 105*  BUN 34* 31* 31* 23  CREATININE 1.10 1.00 0.93 0.87  CALCIUM 9.0  --  8.1* 8.5*  MG  --   --   --  1.8   GFR: Estimated Creatinine Clearance: 54.2 mL/min (by C-G formula based  on SCr of 0.87 mg/dL). Liver Function Tests: Recent Labs  Lab 05/17/18 1229  AST 26  ALT 14*  ALKPHOS 100  BILITOT 0.5  PROT 6.5  ALBUMIN 2.9*   No results for input(s): LIPASE, AMYLASE in the last 168 hours. No results for input(s): AMMONIA in the last 168 hours. Coagulation Profile: Recent Labs  Lab 05/18/18 0927  INR 1.13   Cardiac Enzymes: No results for input(s): CKTOTAL, CKMB, CKMBINDEX, TROPONINI in the last 168 hours. BNP (last 3 results) No results for input(s): PROBNP in the last 8760 hours. HbA1C: No results for input(s): HGBA1C in the last 72 hours. CBG: No results for input(s): GLUCAP in the last 168 hours. Lipid Profile: No results for input(s): CHOL, HDL, LDLCALC, TRIG, CHOLHDL, LDLDIRECT in the last 72 hours. Thyroid Function Tests: No results for input(s): TSH, T4TOTAL, FREET4, T3FREE, THYROIDAB in the last 72 hours. Anemia Panel: Recent Labs    05/18/18 0927  RETICCTPCT 2.7   Sepsis Labs: Recent Labs  Lab 05/17/18 1232  LATICACIDVEN 1.67    Recent Results (from the past 240 hour(s))  Blood Culture (routine x 2)     Status: None (Preliminary result)   Collection Time: 05/17/18 12:30 PM  Result Value Ref Range Status   Specimen Description   Final    BLOOD RIGHT ANTECUBITAL Performed at [facility] [address] [place], [place] [address]    Special Requests   Final    BOTTLES DRAWN AEROBIC AND ANAEROBIC Blood Culture adequate volume Performed at [facility] [address], [place], [place] [address]    Culture   Final    NO GROWTH < 24 HOURS Performed at [facility] [address], [place], [place] [address]    Report Status PENDING  Incomplete  Blood Culture (routine x 2)     Status: None (Preliminary result)   Collection Time: 05/17/18 12:59 PM  Result Value Ref Range Status   Specimen Description   Final    BLOOD BLOOD RIGHT FOREARM Performed at [facility] [address] [place], [place] [address]    Special Requests   Final    BOTTLES DRAWN AEROBIC AND ANAEROBIC Blood Culture adequate volume Performed at [facility] [address], [place], [place] [address]    Culture   Final    NO GROWTH < 24 HOURS Performed at [facility] [address] [place], [place] [address]    Report Status PENDING  Incomplete  Culture, body fluid-bottle     Status: None (Preliminary result)   Collection Time: 05/17/18  4:55 PM  Result Value Ref Range Status   Specimen Description FLUID PLEURAL LEFT  Final   Special Requests BOTTLES DRAWN AEROBIC AND ANAEROBIC  Final   Culture   Final    NO GROWTH < 24 HOURS Performed at [facility], [place] [address] [place], [place] [address]    Report Status PENDING  Incomplete  Gram stain     Status: None   Collection Time: 05/17/18  4:55 PM  Result Value Ref Range Status   Specimen Description FLUID PLEURAL LEFT  Final   Special Requests NONE  Final   Gram Stain   Final    MODERATE WBC PRESENT,BOTH PMN AND MONONUCLEAR NO ORGANISMS SEEN Performed at [facility], [address], [place], [place] [address]    Report Status 05/17/2018 FINAL  Final  MRSA PCR Screening     Status: None   Collection Time: 05/18/18 12:53 AM  Result Value Ref Range Status   MRSA by PCR NEGATIVE NEGATIVE Final    Comment:        The GeneXpert MRSA Assay (FDA approved for NASAL specimens only), is one component of a comprehensive MRSA colonization surveillance program. It is not intended to diagnose MRSA infection nor to guide or monitor treatment for MRSA infections. Performed at [facility], [place] [address] [place], [place] [address]          Radiology Studies: Dg Chest 1 View  Result Date: 05/17/2018 CLINICAL DATA:  Status post LEFT thoracentesis. EXAM: CHEST  1 VIEW COMPARISON:  Portable film earlier in the day. FINDINGS: Marked decrease LEFT pleural  effusion. No visible pneumothorax. Cardiac enlargement, BILATERAL interstitial prominence is redemonstrated stable. Marked osseous degenerative change, both shoulders. IMPRESSION: Marked decreased LEFT pleural effusion post thoracentesis. No pneumothorax. Electronically Signed   By: Staci Righter M.D.   On:  05/17/2018 16:55   Dg Chest 2 View  Result Date: 05/17/2018 CLINICAL DATA:  Shortness of breath. History of left-sided pneumonia. EXAM: CHEST - 2 VIEW COMPARISON:  Portable chest x-ray of Apr 13, 2018 FINDINGS: There is near-total opacification of the left hemithorax. Only a small amount of aerated lung is noted in the apex. There is slight shift of the mediastinum toward the right. The interstitial markings of the right lung are coarse and slightly more conspicuous today. There is no definite right pleural effusion. The left heart border is obscured. The right heart border appears normal. There is severe degenerative change of the left shoulder. IMPRESSION: New near-total opacification of the left hemithorax compatible with large pleural effusion and likely left lung atelectasis. The possibility of a central obstructing lesion is raised. Slight mediastinal shift toward the right. Mildly increased interstitial densities throughout the right lung may reflect edema or less likely pneumonia. Electronically Signed   By: David  Martinique M.D.   On: 05/17/2018 13:53   Ct Chest W Contrast  Result Date: 05/18/2018 CLINICAL DATA:  History of CHF, remote gastric cancer, hypoxia, recurrent bloody left effusion status post thoracentesis twice EXAM: CT CHEST WITH CONTRAST TECHNIQUE: Multidetector CT imaging of the chest was performed during intravenous contrast administration. CONTRAST:  43mL OMNIPAQUE IOHEXOL 300 MG/ML  SOLN COMPARISON:  05/17/2018, 01/25/2014 FINDINGS: Cardiovascular: Atherosclerosis of the thoracic aorta. No significant aneurysm or dissection. Three-vessel arch anatomy appearing patent. No large  central pulmonary embolus or saddle embolus. Native coronary atherosclerosis noted. Aortic valve calcifications present. Heart is normal in size. No pericardial effusion. Mediastinum/Nodes: Abnormal enhancing left hilar, subcarinal, and suspect paraesophageal mediastinal adenopathy. Right hilar adenopathy measures 3.1 x 1.7 cm, image 70. No thyroid abnormality. Trachea remains patent. Central bronchi are patent. No mediastinal hemorrhage or hematoma. Enhancing nodular abnormal soft tissue along the left anterior mediastinum medially and also along the left pericardium diffusely compatible with mediastinal/pleural and likely pericardial tumor involvement. Lungs/Pleura: Background peripheral interstitial fibrotic pattern with basilar honeycombing. Similar areas of apical nodular spiculation and central cavitation compatible with scarring when compared to 2015. Large central left lung mass has obscured margins but roughly measures 6.8 x 8 cm, image 81 series 2. Again, the lung mass appears to be contiguous with abnormal soft tissue invading the left hilum along the pulmonary arteries, left upper and lower lobe bronchi, as well as the left superior and inferior pulmonary veins. Loculated heterogeneous large left pleural effusion with diffuse pleural enhancement and pleural enhancing nodularity inferiorly. Chest wall nodularity also noted posteriorly, image 112 series 2. Constellation of findings are compatible with central bronchogenic left lung cancer invading the mediastinum with mediastinal and hilar adenopathy as well as diffuse left hemithorax pleural and chest wall metastatic disease. This would be compatible with a stage IV lung cancer. Right lung demonstrates scattered parenchymal scarring and peripheral interstitial fibrosis with basilar honeycombing. Small right pleural effusion noted dependently without loculation. Associated right basilar atelectasis. Upper Abdomen: Postop changes from partial gastrectomy.  No upper abdominal adenopathy. Remote cholecystectomy. No acute upper abdominal finding. Musculoskeletal: Limited with motion artifact. Bones are osteopenic. Degenerative changes of the spine and both shoulders. Posterior right tenth and eleventh rib fractures noted. Chronic vertebral plana at T12. No acute compression fracture. Motion artifact across the manubrium and sternum. IMPRESSION: Large obscured left central lung mass measuring up to 8 cm compatible with bronchogenic lung carcinoma with evidence left hilar and mediastinal adenopathy, left hilar central invasion, possible involvement of the left pericardium, and diffuse  left hemithorax pleural metastatic nodularity and chest wall nodularity. Associated partial loculated left effusion appearing complex which may be related to the malignant process or residual hemothorax. Chronic background pulmonary interstitial fibrosis Trace right pleural effusion and associated basilar atelectasis Posterior right tenth and eleventh rib fractures Thoracic aortic atherosclerosis and native coronary atherosclerosis These results will be called to the ordering clinician or representative by the Radiologist Assistant, and communication documented in the PACS or zVision Dashboard. Aortic Atherosclerosis (ICD10-I70.0). Electronically Signed   By: Jerilynn Mages.  Shick M.D.   On: 05/18/2018 13:22   US Thoracentesis Asp Pleural Space W/img Guide  Result Date: 05/17/2018 INDICATION: Patient with history of CHF, gastric cancer 2014, hypoxia, recurrent left pleural effusion. Request made for diagnostic and therapeutic left thoracentesis. EXAM: ULTRASOUND GUIDED DIAGNOSTIC AND THERAPEUTIC LEFT THORACENTESIS MEDICATIONS: None COMPLICATIONS: None immediate. PROCEDURE: An ultrasound guided thoracentesis was thoroughly discussed with the patient and questions answered. The benefits, risks, alternatives and complications were also discussed. The patient understands and wishes to proceed with the  procedure. Written consent was obtained. Ultrasound was performed to localize and mark an adequate pocket of fluid in the left chest. The area was then prepped and draped in the normal sterile fashion. 1% Lidocaine was used for local anesthesia. Under ultrasound guidance a 6 Fr Safe-T-Centesis catheter was introduced. Thoracentesis was performed. The catheter was removed and a dressing applied. FINDINGS: A total of approximately 1.3 liters of dark, bloody fluid was removed. Samples were sent to the laboratory as requested by the clinical team. Due to loculated nature of collection and patient's hypotension only the above amount of fluid was removed today. IMPRESSION: Successful ultrasound guided diagnostic and therapeutic left thoracentesis yielding 1.3 liters of pleural fluid. Read by: Rowe Robert, PA-C Electronically Signed   By: Markus Daft M.D.   On: 05/17/2018 16:51        Scheduled Meds: . sodium chloride   Intravenous Once  . acetaminophen  650 mg Oral Once  . diphenhydrAMINE  12.5 mg Intravenous Once  . ferrous sulfate  325 mg Oral Daily  . levothyroxine  50 mcg Oral Q0600  . simvastatin  40 mg Oral QHS  . tamsulosin  0.4 mg Oral QHS   Continuous Infusions:   LOS: 2 days    Time spent: 40 minutes    Irine Seal, MD [facility] Pager [phone]  If 7PM-7AM, please contact night-coverage [email or web address] Password [facility] 05/19/2018, 9:52 AM

## 2018-05-19 NOTE — Evaluation (Signed)
Physical Therapy Evaluation Patient Details Name: Kyle Armstrong MRN: [id number] DOB: 1930/01/22 Today's Date: 05/19/2018   History of Present Illness  SANCHEZ HEMMER is a 84 y.o. male with medical history significant of chronic diastolic heart failure, hyperlipidemia, BPH who was recently hospitalized for on chronic diastolic congestive heart failure, large left-sided pleural effusion which was removed with thoracentesis on 5/21. Admitted with hypoxia on normal O2 @ 2LPM, requiring increase to 4LPM to maintain sats.  Xray revealed near total opacification left hemithorax compatible with large pleural effusion.  Now s/p thoracentesis.   Clinical Impression  Patient presents with decreased independence with mobility due to weakness, imbalance, decreased activity tolerance and he will benefit from skilled PT in the acute setting to allow return to SNF level rehab at d/c.  Currently needing mod A for mobility to recliner.  Was walking with assist with therapy some at SNF.      Follow Up Recommendations SNF    Equipment Recommendations  None recommended by PT    Recommendations for Other Services       Precautions / Restrictions Precautions Precautions: Fall Precaution Comments: oxygen dependent      Mobility  Bed Mobility Overal bed mobility: Needs Assistance Bed Mobility: Supine to Sit       Sit to supine: Mod assist;HOB elevated   General bed mobility comments: lifting trunk and assist with legs off bed  Transfers Overall transfer level: Needs assistance Equipment used: Rolling walker (2 wheeled) Transfers: Sit to/from [facility] Sit to Stand: Mod assist Stand pivot transfers: Min assist       General transfer comment: lifting assist from EOB; with walker stand step to recliner but not ready for ambulation  Ambulation/Gait                Stairs            Wheelchair Mobility    Modified Rankin (Stroke Patients Only)        Balance Overall balance assessment: Needs assistance   Sitting balance-Leahy Scale: Poor Sitting balance - Comments: collapsed trunk sitting EOB, not able to reach out of BOS and holding EOB in sitting   Standing balance support: Bilateral upper extremity supported Standing balance-Leahy Scale: Poor Standing balance comment: UE needed for balance                             Pertinent Vitals/Pain Pain Assessment: 0-10 Pain Score: 9  Pain Location: back Pain Descriptors / Indicators: Aching(chronic) Pain Intervention(s): Monitored during session;Repositioned    Home Living Family/patient expects to be discharged to:: Skilled nursing facility                      Prior Function Level of Independence: Needs assistance   Gait / Transfers Assistance Needed: was walking limited with therapy at [facility]           Hand Dominance        Extremity/Trunk Assessment   Upper Extremity Assessment Upper Extremity Assessment: LUE deficits/detail LUE Deficits / Details: limited shoulder flexion due to arthritis with crepitus noted with movement into <30 degrees flexion; intact flexion/extension of elbow and grip    Lower Extremity Assessment Lower Extremity Assessment: Generalized weakness    Cervical / Trunk Assessment Cervical / Trunk Assessment: Kyphotic;Other exceptions Cervical / Trunk Exceptions: severe kyphoscoliosis of spine with flexed posture in sitting  Communication   Communication: HOH  Cognition Arousal/Alertness:  Awake/alert Behavior During Therapy: WFL for tasks assessed/performed Overall Cognitive Status: Within Functional Limits for tasks assessed                                        General Comments      Exercises     Assessment/Plan    PT Assessment Patient needs continued PT services  PT Problem List Decreased strength;Decreased mobility;Decreased activity tolerance;Decreased balance;Decreased knowledge of use  of DME;Decreased safety awareness;Cardiopulmonary status limiting activity       PT Treatment Interventions DME instruction;Functional mobility training;Patient/family education;Balance training;Gait training;Therapeutic activities;Therapeutic exercise    PT Goals (Current goals can be found in the Care Plan section)  Acute Rehab PT Goals Patient Stated Goal: to return to SNF PT Goal Formulation: With patient Time For Goal Achievement: 06/02/18 Potential to Achieve Goals: Fair    Frequency Min 2X/week   Barriers to discharge        Co-evaluation               AM-PAC PT "6 Clicks" Daily Activity  Outcome Measure Difficulty turning over in bed (including adjusting bedclothes, sheets and blankets)?: A Lot Difficulty moving from lying on back to sitting on the side of the bed? : Unable Difficulty sitting down on and standing up from a chair with arms (e.g., wheelchair, bedside commode, etc,.)?: Unable Help needed moving to and from a bed to chair (including a wheelchair)?: A Little Help needed walking in hospital room?: A Lot Help needed climbing 3-5 steps with a railing? : Total 6 Click Score: 10    End of Session Equipment Utilized During Treatment: Gait belt;Oxygen Activity Tolerance: Patient limited by fatigue Patient left: with call bell/phone within reach;in chair;with chair alarm set   PT Visit Diagnosis: Other abnormalities of gait and mobility (R26.89);Muscle weakness (generalized) (M62.81)    Time: 7948-0165 PT Time Calculation (min) (ACUTE ONLY): 28 min   Charges:   PT Evaluation $PT Eval Moderate Complexity: 1 Mod PT Treatments $Therapeutic Activity: 8-22 mins   PT G CodesMagda Kiel, [place] [phone] 05/19/2018   Reginia Naas 05/19/2018, 1:22 PM

## 2018-05-20 ENCOUNTER — Encounter (HOSPITAL_COMMUNITY): Payer: Self-pay

## 2018-05-20 ENCOUNTER — Inpatient Hospital Stay (HOSPITAL_COMMUNITY): Payer: Medicare HMO

## 2018-05-20 LAB — BPAM RBC
BLOOD PRODUCT EXPIRATION DATE: 201907102359
Blood Product Expiration Date: 201907162359
Blood Product Expiration Date: 201907162359
ISSUE DATE / TIME: 201906251048
ISSUE DATE / TIME: 201906261557
ISSUE DATE / TIME: 201906261216
UNIT TYPE AND RH: 6200
Unit Type and Rh: 6200
Unit Type and Rh: 6200

## 2018-05-20 LAB — TYPE AND SCREEN
ABO/RH(D): A POS
ANTIBODY SCREEN: NEGATIVE
Unit division: 0
Unit division: 0
Unit division: 0

## 2018-05-20 LAB — CBC
HEMATOCRIT: 31.5 % — AB (ref 39.0–52.0)
Hemoglobin: 10 g/dL — ABNORMAL LOW (ref 13.0–17.0)
MCH: 29.5 pg (ref 26.0–34.0)
MCHC: 31.7 g/dL (ref 30.0–36.0)
MCV: 92.9 fL (ref 78.0–100.0)
PLATELETS: 236 10*3/uL (ref 150–400)
RBC: 3.39 MIL/uL — ABNORMAL LOW (ref 4.22–5.81)
RDW: 17.9 % — AB (ref 11.5–15.5)
WBC: 9.4 10*3/uL (ref 4.0–10.5)

## 2018-05-20 LAB — BASIC METABOLIC PANEL
ANION GAP: 6 (ref 5–15)
BUN: 23 mg/dL (ref 8–23)
CALCIUM: 8.5 mg/dL — AB (ref 8.9–10.3)
CO2: 30 mmol/L (ref 22–32)
Chloride: 104 mmol/L (ref 98–111)
Creatinine, Ser: 0.89 mg/dL (ref 0.61–1.24)
Glucose, Bld: 114 mg/dL — ABNORMAL HIGH (ref 70–99)
Potassium: 4.3 mmol/L (ref 3.5–5.1)
Sodium: 140 mmol/L (ref 135–145)

## 2018-05-20 LAB — MAGNESIUM: Magnesium: 1.9 mg/dL (ref 1.7–2.4)

## 2018-05-20 MED ORDER — METOPROLOL TARTRATE 5 MG/5ML IV SOLN: 5.0000 mg | Freq: Four times a day (QID) | INTRAVENOUS | Status: DC | PRN

## 2018-05-20 MED ORDER — MIDAZOLAM HCL 2 MG/2ML IJ SOLN
INTRAMUSCULAR | Status: AC
  Filled 2018-05-20: qty 2

## 2018-05-20 MED ORDER — MIDAZOLAM HCL 2 MG/2ML IJ SOLN
INTRAMUSCULAR | Status: AC | PRN
  Administered 2018-05-20: 0.5 mg via INTRAVENOUS

## 2018-05-20 MED ORDER — FENTANYL CITRATE (PF) 100 MCG/2ML IJ SOLN
INTRAMUSCULAR | Status: AC | PRN
  Administered 2018-05-20: 25 ug via INTRAVENOUS

## 2018-05-20 MED ORDER — LIDOCAINE HCL 1 % IJ SOLN
INTRAMUSCULAR | Status: AC
  Filled 2018-05-20: qty 10

## 2018-05-20 MED ORDER — FENTANYL CITRATE (PF) 100 MCG/2ML IJ SOLN
INTRAMUSCULAR | Status: AC
  Filled 2018-05-20: qty 2

## 2018-05-20 NOTE — Care Management Important Message (Signed)
Important Message  Patient Details  Name: Kyle Armstrong MRN: [id number] Date of Birth: 30-Dec-1929   Medicare Important Message Given:  Yes    Kerin Salen 05/20/2018, 12:07 [place] Message  Patient Details  Name: Kyle Armstrong MRN: [id number] Date of Birth: 27-Sep-1930   Medicare Important Message Given:  Yes    Kerin Salen 05/20/2018, 12:07 PM

## 2018-05-20 NOTE — Progress Notes (Signed)
IP PROGRESS NOTE  Subjective:   [name] reports feeling no better compared to hospital admission. He appears unchanged. Objective: Vital signs in last 24 hours: Blood pressure 102/75, pulse 93, temperature 98.1 F (36.7 C), temperature source Oral, resp. rate 12, height '5\' 7"'$  (1.702 m), weight 138 lb 10.7 oz (62.9 kg), SpO2 95 %.  Intake/Output from previous day: 06/26 0701 - 06/27 0700 In: 1343.1 [P.O.:240; I.V.:50; Blood:1053.1] Out: 100 [Urine:100]  Physical Exam:  HEENT: Edentulous, no thrush Lungs: Decreased breath sounds at the left compared to the right chest, inspiratory rales at the right lower chest Cardiac: Regular rate and rhythm Extremities: No leg edema    Lab Results: Recent Labs    05/19/18 0846 05/19/18 2059 05/20/18 0450  WBC 7.7  --  9.4  HGB 7.4* 9.8* 10.0*  HCT 23.7* 30.2* 31.5*  PLT 261  --  236    BMET Recent Labs    05/19/18 0846 05/20/18 0450  NA 139 140  K 4.1 4.3  CL 101 104  CO2 30 30  GLUCOSE 105* 114*  BUN 23 23  CREATININE 0.87 0.89  CALCIUM 8.5* 8.5*    No results found for: CEA1  Studies/Results: Ct Chest W Contrast  Result Date: 05/18/2018 CLINICAL DATA:  History of CHF, remote gastric cancer, hypoxia, recurrent bloody left effusion status post thoracentesis twice EXAM: CT CHEST WITH CONTRAST TECHNIQUE: Multidetector CT imaging of the chest was performed during intravenous contrast administration. CONTRAST:  88m OMNIPAQUE IOHEXOL 300 MG/ML  SOLN COMPARISON:  05/17/2018, 01/25/2014 FINDINGS: Cardiovascular: Atherosclerosis of the thoracic aorta. No significant aneurysm or dissection. Three-vessel arch anatomy appearing patent. No large central pulmonary embolus or saddle embolus. Native coronary atherosclerosis noted. Aortic valve calcifications present. Heart is normal in size. No pericardial effusion. Mediastinum/Nodes: Abnormal enhancing left hilar, subcarinal, and suspect paraesophageal mediastinal adenopathy. Right  hilar adenopathy measures 3.1 x 1.7 cm, image 70. No thyroid abnormality. Trachea remains patent. Central bronchi are patent. No mediastinal hemorrhage or hematoma. Enhancing nodular abnormal soft tissue along the left anterior mediastinum medially and also along the left pericardium diffusely compatible with mediastinal/pleural and likely pericardial tumor involvement. Lungs/Pleura: Background peripheral interstitial fibrotic pattern with basilar honeycombing. Similar areas of apical nodular spiculation and central cavitation compatible with scarring when compared to 2015. Large central left lung mass has obscured margins but roughly measures 6.8 x 8 cm, image 81 series 2. Again, the lung mass appears to be contiguous with abnormal soft tissue invading the left hilum along the pulmonary arteries, left upper and lower lobe bronchi, as well as the left superior and inferior pulmonary veins. Loculated heterogeneous large left pleural effusion with diffuse pleural enhancement and pleural enhancing nodularity inferiorly. Chest wall nodularity also noted posteriorly, image 112 series 2. Constellation of findings are compatible with central bronchogenic left lung cancer invading the mediastinum with mediastinal and hilar adenopathy as well as diffuse left hemithorax pleural and chest wall metastatic disease. This would be compatible with a stage IV lung cancer. Right lung demonstrates scattered parenchymal scarring and peripheral interstitial fibrosis with basilar honeycombing. Small right pleural effusion noted dependently without loculation. Associated right basilar atelectasis. Upper Abdomen: Postop changes from partial gastrectomy. No upper abdominal adenopathy. Remote cholecystectomy. No acute upper abdominal finding. Musculoskeletal: Limited with motion artifact. Bones are osteopenic. Degenerative changes of the spine and both shoulders. Posterior right tenth and eleventh rib fractures noted. Chronic vertebral plana  at T12. No acute compression fracture. Motion artifact across the manubrium and sternum. IMPRESSION: Large  obscured left central lung mass measuring up to 8 cm compatible with bronchogenic lung carcinoma with evidence left hilar and mediastinal adenopathy, left hilar central invasion, possible involvement of the left pericardium, and diffuse left hemithorax pleural metastatic nodularity and chest wall nodularity. Associated partial loculated left effusion appearing complex which may be related to the malignant process or residual hemothorax. Chronic background pulmonary interstitial fibrosis Trace right pleural effusion and associated basilar atelectasis Posterior right tenth and eleventh rib fractures Thoracic aortic atherosclerosis and native coronary atherosclerosis These results will be called to the ordering clinician or representative by the Radiologist Assistant, and communication documented in the PACS or zVision Dashboard. Aortic Atherosclerosis (ICD10-I70.0). Electronically Signed   By: Jerilynn Mages.  Shick M.D.   On: 05/18/2018 13:22    Medications: I have reviewed the patient's current medications.  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   Initiation radiation and concurrent Xeloda 11/10/2013, completed 12/21/2013   Restaging CTs of the chest, abdomen, and pelvis on 01/25/2014 with persistent antral thickening, no evidence of metastatic disease.   Distal gastrectomy with Billroth II anastomosis 02/15/2014 (y pT1b, y pN0).  CT scans 03/14/2016 for evaluation of mid abdominal and back pain-no acute findings within the abdomen or pelvis. Postoperative changes identified compatible distal gastrectomy with gastrojejunostomy.  2. Diffuse "gastritis "change noted on endoscopies 08/19/2013 and 10/06/2013 with biopsies confirming atrophic gastritis. Low and high-grade dysplasia were noted on  the biopsy 08/19/2013  3. History of Anemia-likely secondary to GI blood loss, surgery, chemotherapy/radiation, and malnutrition 4. History of anorexia/weight loss.  5. History of colon polyp.  6. History of "sarcoidosis", pulmonary fibrosis noted on the chest CT 10/27/2013.  7. History of tobacco use.  8. History of arrhythmia, status post an ablation August 2013.  9.ChronicBack pain-most likely secondary to a benign musculoskeletal condition 10.  Admission 05/17/2018 with respiratory failure  Left pleural effusion on chest x-ray 04/10/2018, left thoracentesis 04/13/2018-bloody fluid, negative cytology  Large left effusion on admission chest x-ray, cytology negative  CT chest 05/18/2018- central left lung mass, left hilar/mediastinal adenopathy, left pleural nodularity   [name] appears unchanged.  The pleural fluid cytology is nondiagnostic.  I discussed the probable diagnosis of lung cancer with Mr.Kyle Armstrong.  We discussed comfort care versus pursuing a diagnosis.  He would like to proceed with a biopsy to confirm a diagnosis.  We will then discuss treatment options.  I reviewed the admission chest CT with interventional radiology.  There is a pleural-based left chest wall mass that appears amenable to biopsy.  We will schedule a biopsy for today. Recommendations: 1.  Proceed with biopsy of a left chest wall mass 2.  Follow-up pathology and discuss treatment options  I will continue following [name] in the hospital and we will arrange outpatient follow-up.   LOS: 3 days   Betsy Coder, MD   05/20/2018, 12:47 PM

## 2018-05-20 NOTE — Procedures (Signed)
Interventional Radiology Procedure Note  Procedure: US guided core biopsy left posterior pleural mass.   Complications: None immediate.   Estimated Blood Loss: None  Recommendations: - Return to room - Path pending  Signed,  Criselda Peaches, MD

## 2018-05-20 NOTE — Progress Notes (Signed)
Referring Physician(s): Sherrill,B  Supervising Physician: Jacqulynn Cadet  Patient Status:  [facility] - In-pt  Chief Complaint:  Dyspnea, left lung mass, left pleural efusion  Subjective: Patient familiar to IR service from recent thoracenteses, latest on 05/17/2018 which was bloody but with negative cytology.  He has a prior history of tobacco abuse and gastric cancer in 2014. CT scan performed 6/25 revealed a large left central lung mass with left hilar/ mediastinal adenopathy, left hilar central invasion, possible involvement of left pericardium and diffuse left hemithorax with pleural metastatic nodularity and chest wall nodularity.  There was associated loculated/complex left effusion, chronic pulmonary interstitial fibrosis, trace right effusion, posterior right 10th and 11th rib fractures.  Request now received from oncology for image guided left chest wall mass biopsy for further evaluation.  Patient's main complaint at this time is wanting something to eat and drink.  He denies fever, headache, worsening chest pain, N/V or bleeding.  He does have back pain as well as dyspnea.   Past Medical History:  Diagnosis Date  . Allergy   . Arthritis   . Eczema   . Gastric cancer ([place]) 10/24/2013  . Hx of radiation therapy 11/10/13-12/21/13   gastric ca, total 50.4Gy  . Hyperlipidemia   . Hypothyroidism   . Lazy eye of right side   . Pernicious anemia   . 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 EP study and ablation  07/16/12   slow pathway ablation for AVNRT by DR Rasean Joos  . HERNIA REPAIR  1980's   inguinal done x 2  . IR THORACENTESIS ASP PLEURAL SPACE W/IMG GUIDE  04/13/2018  . LAPAROSCOPIC CHOLECYSTECTOMY W/ CHOLANGIOGRAPHY  2011  . LAPAROSCOPIC PARTIAL GASTRECTOMY N/A 02/15/2014   Procedure: LAPAROSCOPIC diagnositc, distal gastrostomy  feeding jejunostomy;  Surgeon: Stark Klein, MD;   Location: [facility];  Service: General;  Laterality: N/A;  . LAPAROSCOPIC [place] Bilateral 2007   bil recurrent inguinal hernias  . SUPRAVENTRICULAR TACHYCARDIA ABLATION Bilateral 07/16/2012   Procedure: SUPRAVENTRICULAR TACHYCARDIA ABLATION;  Surgeon: Thompson Grayer, MD;  Location: [facility] CATH LAB;  Service: Cardiovascular;  Laterality: Bilateral;      Allergies: Lipitor [atorvastatin]; Amoxicillin; and Augmentin [amoxicillin-pot clavulanate]  Medications: Prior to Admission medications   Medication Sig Start Date End Date Taking? Authorizing Provider  acetaminophen (TYLENOL) 500 MG tablet Take 1,000 mg by mouth every 8 (eight) hours as needed (For pain.).   Yes [provider]  albuterol (PROVENTIL HFA;VENTOLIN HFA) 108 (90 Base) MCG/ACT inhaler Inhale 2 puffs into the lungs every 6 (six) hours as needed for wheezing or shortness of breath. 04/16/18  Yes Emokpae, Courage, MD  Amino Acids-Protein Hydrolys (FEEDING SUPPLEMENT, PRO-STAT SUGAR FREE 64,) LIQD Take 30 mLs by mouth daily.   Yes [provider]  aspirin 81 MG chewable tablet Chew 81 mg by mouth daily.   Yes [provider]  cyanocobalamin (,VITAMIN B-12,) 1000 MCG/ML injection Inject 1,000 mcg into the muscle every 30 (thirty) days.   Yes [provider]  ferrous sulfate 325 (65 FE) MG EC tablet Take 325 mg by mouth daily.  06/16/14  Yes Ladell Pier, MD  furosemide (LASIX) 40 MG tablet Take 40 mg by mouth.   Yes [provider]  levothyroxine (SYNTHROID, LEVOTHROID) 50 MCG tablet Take 1 tablet (50 mcg total) by mouth daily before breakfast. 04/16/18  Yes Roxan Hockey, MD  metoprolol tartrate (LOPRESSOR) 25 MG tablet Take 0.5 tablets (12.5 mg total) by mouth daily. Patient taking differently: Take 12.5 mg by mouth 2 (two) times daily. Hold if SBP is less than 100 or DBP is less than 50. 04/23/18  Yes Fay Records, MD  potassium chloride SA  (K-DUR,KLOR-CON) 20 MEQ tablet Take 1 tablet (20 mEq total) by mouth daily. 06/26/17  Yes Rai, Ripudeep K, MD  simvastatin (ZOCOR) 40 MG tablet Take 1 tablet (40 mg total) by mouth every evening. Patient taking differently: Take 40 mg by mouth at bedtime.  04/16/18  Yes Emokpae, Courage, MD  tamsulosin (FLOMAX) 0.4 MG CAPS capsule Take 1 capsule (0.4 mg total) by mouth daily. Patient taking differently: Take 0.4 mg by mouth at bedtime.  04/16/18  Yes Roxan Hockey, MD     Vital Signs: BP 100/61 (BP Location: Left Arm)   Pulse 85   Temp 98.1 F (36.7 C) (Oral)   Resp 20   Ht 5\' 7"  (1.702 m)   Wt 138 lb 10.7 oz (62.9 kg)   SpO2 98%   BMI 21.72 kg/m   Physical Exam awake, alert.  Hard of hearing.  Chest with diminished breath sounds right base and throughout left lung; heart with regular rate and rhythm.  Abdomen soft, positive bowel sounds, nontender.  No lower extremity edema.  Imaging: Dg Chest 1 View  Result Date: 05/17/2018 CLINICAL DATA:  Status post LEFT thoracentesis. EXAM: CHEST  1 VIEW COMPARISON:  Portable film earlier in the day. FINDINGS: Marked decrease LEFT pleural effusion. No visible pneumothorax. Cardiac enlargement, BILATERAL interstitial prominence is redemonstrated stable. Marked osseous degenerative change, both shoulders. IMPRESSION: Marked decreased LEFT pleural effusion post thoracentesis. No pneumothorax. Electronically Signed   By: Staci Righter M.D.   On: 05/17/2018 16:55   Dg Chest 2 View  Result Date: 05/17/2018 CLINICAL DATA:  Shortness of breath. History of left-sided pneumonia. EXAM: CHEST - 2 VIEW COMPARISON:  Portable chest x-ray of Apr 13, 2018 FINDINGS: There is near-total opacification of the left hemithorax. Only a small amount of aerated lung is noted in the apex. There is slight shift of the mediastinum toward the right. The interstitial markings of the right lung are coarse and slightly more conspicuous today. There is no definite right pleural  effusion. The left heart border is obscured. The right heart border appears normal. There is severe degenerative change of the left shoulder. IMPRESSION: New near-total opacification of the left hemithorax compatible with large pleural effusion and likely left lung atelectasis. The possibility of a central obstructing lesion is raised. Slight mediastinal shift toward the right. Mildly increased interstitial densities throughout the right lung may reflect edema or less likely pneumonia. Electronically Signed   By: David  Martinique M.D.   On: 05/17/2018 13:53   Ct Chest W Contrast  Result Date: 05/18/2018 CLINICAL DATA:  History of CHF, remote gastric cancer, hypoxia, recurrent bloody left effusion status post thoracentesis twice EXAM: CT CHEST WITH CONTRAST TECHNIQUE: Multidetector CT imaging of the chest was performed during intravenous contrast administration. CONTRAST:  12mL OMNIPAQUE IOHEXOL 300 MG/ML  SOLN COMPARISON:  05/17/2018, 01/25/2014 FINDINGS: Cardiovascular: Atherosclerosis of the thoracic aorta. No significant aneurysm or dissection. Three-vessel arch anatomy appearing patent. No large central pulmonary embolus or saddle embolus. Native coronary atherosclerosis noted. Aortic valve calcifications present. Heart is normal in size. No pericardial effusion. Mediastinum/Nodes: Abnormal enhancing left hilar, subcarinal, and suspect paraesophageal mediastinal adenopathy. Right hilar adenopathy measures 3.1 x 1.7 cm, image 70. No  thyroid abnormality. Trachea remains patent. Central bronchi are patent. No mediastinal hemorrhage or hematoma. Enhancing nodular abnormal soft tissue along the left anterior mediastinum medially and also along the left pericardium diffusely compatible with mediastinal/pleural and likely pericardial tumor involvement. Lungs/Pleura: Background peripheral interstitial fibrotic pattern with basilar honeycombing. Similar areas of apical nodular spiculation and central cavitation  compatible with scarring when compared to 2015. Large central left lung mass has obscured margins but roughly measures 6.8 x 8 cm, image 81 series 2. Again, the lung mass appears to be contiguous with abnormal soft tissue invading the left hilum along the pulmonary arteries, left upper and lower lobe bronchi, as well as the left superior and inferior pulmonary veins. Loculated heterogeneous large left pleural effusion with diffuse pleural enhancement and pleural enhancing nodularity inferiorly. Chest wall nodularity also noted posteriorly, image 112 series 2. Constellation of findings are compatible with central bronchogenic left lung cancer invading the mediastinum with mediastinal and hilar adenopathy as well as diffuse left hemithorax pleural and chest wall metastatic disease. This would be compatible with a stage IV lung cancer. Right lung demonstrates scattered parenchymal scarring and peripheral interstitial fibrosis with basilar honeycombing. Small right pleural effusion noted dependently without loculation. Associated right basilar atelectasis. Upper Abdomen: Postop changes from partial gastrectomy. No upper abdominal adenopathy. Remote cholecystectomy. No acute upper abdominal finding. Musculoskeletal: Limited with motion artifact. Bones are osteopenic. Degenerative changes of the spine and both shoulders. Posterior right tenth and eleventh rib fractures noted. Chronic vertebral plana at T12. No acute compression fracture. Motion artifact across the manubrium and sternum. IMPRESSION: Large obscured left central lung mass measuring up to 8 cm compatible with bronchogenic lung carcinoma with evidence left hilar and mediastinal adenopathy, left hilar central invasion, possible involvement of the left pericardium, and diffuse left hemithorax pleural metastatic nodularity and chest wall nodularity. Associated partial loculated left effusion appearing complex which may be related to the malignant process or  residual hemothorax. Chronic background pulmonary interstitial fibrosis Trace right pleural effusion and associated basilar atelectasis Posterior right tenth and eleventh rib fractures Thoracic aortic atherosclerosis and native coronary atherosclerosis These results will be called to the ordering clinician or representative by the Radiologist Assistant, and communication documented in the PACS or zVision Dashboard. Aortic Atherosclerosis (ICD10-I70.0). Electronically Signed   By: Jerilynn Mages.  Shick M.D.   On: 05/18/2018 13:22   US Thoracentesis Asp Pleural Space W/img Guide  Result Date: 05/17/2018 INDICATION: Patient with history of CHF, gastric cancer 2014, hypoxia, recurrent left pleural effusion. Request made for diagnostic and therapeutic left thoracentesis. EXAM: ULTRASOUND GUIDED DIAGNOSTIC AND THERAPEUTIC LEFT THORACENTESIS MEDICATIONS: None COMPLICATIONS: None immediate. PROCEDURE: An ultrasound guided thoracentesis was thoroughly discussed with the patient and questions answered. The benefits, risks, alternatives and complications were also discussed. The patient understands and wishes to proceed with the procedure. Written consent was obtained. Ultrasound was performed to localize and mark an adequate pocket of fluid in the left chest. The area was then prepped and draped in the normal sterile fashion. 1% Lidocaine was used for local anesthesia. Under ultrasound guidance a 6 Fr Safe-T-Centesis catheter was introduced. Thoracentesis was performed. The catheter was removed and a dressing applied. FINDINGS: A total of approximately 1.3 liters of dark, bloody fluid was removed. Samples were sent to the laboratory as requested by the clinical team. Due to loculated nature of collection and patient's hypotension only the above amount of fluid was removed today. IMPRESSION: Successful ultrasound guided diagnostic and therapeutic left thoracentesis yielding 1.3 liters of pleural  fluid. Read by: Rowe Robert, PA-C  Electronically Signed   By: Markus Daft M.D.   On: 05/17/2018 16:51    Labs:  CBC: Recent Labs    05/18/18 0927 05/18/18 1557 05/19/18 0846 05/19/18 2059 05/20/18 0450  WBC 7.5 8.5 7.7  --  9.4  HGB 6.4* 7.7* 7.4* 9.8* 10.0*  HCT 20.4* 24.1* 23.7* 30.2* 31.5*  PLT 275 268 261  --  236    COAGS: Recent Labs    05/18/18 0927  INR 1.13    BMP: Recent Labs    05/17/18 1229 05/17/18 1246 05/18/18 0522 05/19/18 0846 05/20/18 0450  NA 137 136 138 139 140  K 4.2 4.1 4.5 4.1 4.3  CL 96* 93* 102 101 104  CO2 31  --  31 30 30   GLUCOSE 118* 112* 99 105* 114*  BUN 34* 31* 31* 23 23  CALCIUM 9.0  --  8.1* 8.5* 8.5*  CREATININE 1.10 1.00 0.93 0.87 0.89  GFRNONAA 59*  --  >60 >60 >60  GFRAA >60  --  >60 >60 >60    LIVER FUNCTION TESTS: Recent Labs    06/21/17 0121 04/13/18 0519 04/16/18 0617 05/17/18 1229  BILITOT 0.7 0.7  --  0.5  AST 18 22  --  26  ALT 28 9*  --  14*  ALKPHOS 82 100  --  100  PROT 5.3* 4.9*  --  6.5  ALBUMIN 3.3* 2.6* 2.6* 2.9*    Assessment and Plan: Pt with prior history of tobacco abuse and gastric cancer in 2014.  Status post left thoracenteses x2, most recently on 05/17/2018 with bloody output but negative cytology.  CT scan performed 6/25 revealed a large left central lung mass with left hilar/ mediastinal adenopathy, left hilar central invasion, possible involvement of left pericardium and diffuse left hemithorax with pleural metastatic nodularity and chest wall nodularity.  There was associated loculated/complex left effusion, chronic pulmonary interstitial fibrosis, trace right effusion, posterior right 10th and 11th rib fractures.  Request now received from oncology for image guided left chest wall mass biopsy for further evaluation.  Imaging studies have been reviewed by Dr. Laurence Ferrari.Risks and benefits discussed with the patient including, but not limited to bleeding, infection, damage to adjacent structures or low yield requiring additional  tests.  All of the patient's questions were answered, patient is agreeable to proceed. Consent signed and in chart.  Procedure scheduled for today.   Electronically Signed: D. Rowe Robert, PA-C 05/20/2018, 9:44 AM   I spent a total of 20 minutes at the the patient's bedside AND on the patient's hospital floor or unit, greater than 50% of which was counseling/coordinating care for image guided left chest wall mass biopsy    Patient ID: [name], male   DOB: 05-Aug-1930, 84 y.o.   MRN: [id number]

## 2018-05-20 NOTE — Evaluation (Signed)
Occupational Therapy Evaluation Patient Details Name: Kyle Armstrong MRN: [id number] DOB: 06-22-1930 Today's Date: 05/20/2018    History of Present Illness Kyle Armstrong is a 84 y.o. male with medical history significant of chronic diastolic heart failure, hyperlipidemia, BPH who was recently hospitalized for on chronic diastolic congestive heart failure, large left-sided pleural effusion which was removed with thoracentesis on 5/21. Admitted with hypoxia on normal O2 @ 2LPM, requiring increase to 4LPM to maintain sats.  Xray revealed near total opacification left hemithorax compatible with large pleural effusion.  Now s/p thoracentesis.    Clinical Impression   Pt admitted with above diagnosis. Pt currently with functional limitations due to the deficits listed below (see OT Problem List).  Pt will benefit from skilled OT to increase their safety and independence with ADL and functional mobility for ADL to facilitate discharge to venue listed below.      Follow Up Recommendations  SNF    Equipment Recommendations  None recommended by OT    Recommendations for Other Services       Precautions / Restrictions Precautions Precautions: Fall Precaution Comments: oxygen dependent      Mobility Bed Mobility Overal bed mobility: Needs Assistance Bed Mobility: Supine to Sit       Sit to supine: Mod assist;HOB elevated   General bed mobility comments: lifting trunk and assist with legs off bed  Transfers Overall transfer level: Needs assistance Equipment used: Rolling walker (2 wheeled) Transfers: Sit to/from Stand Sit to Stand: Mod assist              Balance Overall balance assessment: Needs assistance   Sitting balance-Leahy Scale: Poor Sitting balance - Comments: collapsed trunk sitting EOB, not able to reach out of BOS and holding EOB in sitting   Standing balance support: Bilateral upper extremity supported Standing balance-Leahy Scale: Poor Standing balance  comment: UE needed for balance                           ADL either performed or assessed with clinical judgement   ADL Overall ADL's : Needs assistance/impaired Eating/Feeding: Minimal assistance;Sitting   Grooming: Minimal assistance;Sitting   Upper Body Bathing: Moderate assistance;Sitting   Lower Body Bathing: Maximal assistance;Sit to/from stand;Cueing for sequencing;Cueing for safety   Upper Body Dressing : Minimal assistance;Sitting   Lower Body Dressing: Maximal assistance;Sit to/from stand                 General ADL Comments: pt sat EOB with OT /  Session shortened as transportation came to get pt for procedure     Vision Patient Visual Report: No change from baseline       Perception     Praxis      Pertinent Vitals/Pain Pain Score: 6  Pain Location: back Pain Descriptors / Indicators: Aching Pain Intervention(s): Limited activity within patient's tolerance;Monitored during session     Hand Dominance     Extremity/Trunk Assessment Upper Extremity Assessment LUE Deficits / Details: limited shoulder flexion due to arthritis with crepitus noted with movement into <30 degrees flexion; intact flexion/extension of elbow and grip       Cervical / Trunk Assessment Cervical / Trunk Assessment: Kyphotic;Other exceptions Cervical / Trunk Exceptions: severe kyphoscoliosis of spine with flexed posture in sitting   Communication Communication Communication: HOH   Cognition Arousal/Alertness: Awake/alert Behavior During Therapy: WFL for tasks assessed/performed Overall Cognitive Status: Within Functional Limits for tasks assessed  Home Living Family/patient expects to be discharged to:: Skilled nursing facility                                        Prior Functioning/Environment Level of Independence: Needs assistance  Gait / Transfers Assistance Needed: was  walking limited with therapy at [facility]              OT Problem List: Decreased strength;Decreased activity tolerance;Decreased safety awareness;Impaired balance (sitting and/or standing);Decreased knowledge of use of DME or AE;Cardiopulmonary status limiting activity      OT Treatment/Interventions: Self-care/ADL training;Patient/family education;Energy conservation;Therapeutic activities    OT Goals(Current goals can be found in the care plan section) Acute Rehab OT Goals Patient Stated Goal: to return to SNF OT Goal Formulation: With patient Time For Goal Achievement: 06/03/18  OT Frequency: Min 2X/week   Barriers to D/C:               AM-PAC PT "6 Clicks" Daily Activity     Outcome Measure Help from another person eating meals?: A Little Help from another person taking care of personal grooming?: A Little Help from another person toileting, which includes using toliet, bedpan, or urinal?: A Lot Help from another person bathing (including washing, rinsing, drying)?: A Lot Help from another person to put on and taking off regular upper body clothing?: A Little Help from another person to put on and taking off regular lower body clothing?: A Lot 6 Click Score: 15   End of Session Nurse Communication: Mobility status  Activity Tolerance: Patient tolerated treatment well Patient left: in bed  OT Visit Diagnosis: Unsteadiness on feet (R26.81);Muscle weakness (generalized) (M62.81);Repeated falls (R29.6);History of falling (Z91.81)                Time: 5277-8242 OT Time Calculation (min): 13 min Charges:  OT General Charges $OT Visit: 1 Visit OT Evaluation $OT Eval Moderate Complexity: 1 Mod G-Codes:     Kari Baars, Lamy  Payton Mccallum D 05/20/2018, 12:15 PM

## 2018-05-20 NOTE — Progress Notes (Signed)
PROGRESS NOTE    KENNA KIRN  QIW:[id number] DOB: [birth year] DOA: 05/17/2018 PCP: Lawerance Cruel, MD   Brief Narrative: Pt. with PMH of chronic diastolic CHF, HLD, BPH; admitted on 05/17/2018, presented with complaint of shortness of breath, was found to have recurrent left pleural effusion secondary to left lung mass. Currently further plan is continue close monitoring and supportive measures as well as follow-up on recommendations from oncology.     Assessment & Plan:   Principal Problem:   Acute hypoxemic respiratory failure (HCC) Active Problems:   Hyperlipidemia   Hypothyroidism   Pleural effusion   Lung mass   Goals of care, counseling/discussion   Advance care planning   Palliative care by specialist   S/P thoracentesis   Acute blood loss anemia  .  Acute on chronic hypoxic respiratory failure. Recurrent left pleural effusion-hemothorax.  Likely malignant Left lung cancer with mediastinal mass.   prior history of gastric carcinoma treated with surgery and in remission. Baseline uses 2 L of oxygen. Status post thoracentesis with 1.3 L of bloody fluid removed 05/18/2018.  Concern for malignant pleural effusion.   Cytology as well as infectious work-up from prior thoracentesis was negative although CT of the chest performed this admission shows large central left lung mass roughly 7 x 8 cm size contiguous with left hilum mass along the pulmonary artery probably involving pleura and pericardium along with paraesophageal, bilateral hilar subcarinal adenopathy.  Improved but persistent left hemithorax compatible with stage IV lung cancer. Oncology consulted and patient seen in consultation by Dr. Benay Spice who is awaiting cytology results.  Cytology results were negative for malignancy and as such oncology consulted with IR for biopsy of pleural mass which is pending for today 05/20/2018.  Patient noted to have systolic blood pressures in the 90s.  Hemoglobin at 10.0 from  7.4 after transfusion of 2 units packed red blood cells. Follow H&H. Initially thought to be fluid related to CHF and therefore patient was given IV Lasix will hold off on that right now. Even though no active bleeding noted on the CT scan, should the patient has persistent hemodynamic instability or no improvement in anemia may benefit from IR consultation for embolization. Palliative care following.    2.  Acute blood loss anemia. Likely secondary to bloody pleural effusion.  Status post 1 unit packed red blood cells.  Hemoglobin at 10.0 from 7.4.  Patient with systolic blood pressures in the low 90s.  Status post 2 units packed red blood cells.  Follow H&H. Transfusion threshold hemoglobin less than 7 or hemodynamic instability.   3.  Chronic diastolic CHF. Currently euvolemic.  Blood pressure was borderline and as such antihypertensive medications and diuretics on hold.  Will follow.   4.  Dyslipidemia. Stable.  Continue statin.    5.  BPH. Stable.  Continue Flomax.   6.  Hypothyroidism. TSH was 1.372 on 04/11/2018.  Continue Synthroid.  7.  Hypertension Blood pressure borderline.  Antihypertensive medications on hold.   8.  Chronic interstitial fibrosis of bilateral lung parenchyma with honeycombing. Could partly be contributing to patient's hypoxia.  Follow for now.     DVT prophylaxis: SCDs Code Status: DNR Family Communication: Updated patient.  No family at bedside. Disposition Plan: To be determined.  Likely skilled nursing facility.   Consultants:   Oncology: Dr. Benay Spice 05/19/2018  Palliative care: Mariana Kaufman, NP 05/19/2018  Interventional radiology  Procedures:   CT chest 05/18/2018  Chest x-ray 05/17/2018  Ultrasound guided thoracentesis 05/17/2018--1.3  L of dark bloody fluid removed.  Per Rowe Robert, PA 05/17/2018  2 units packed red blood cells pending 05/19/2018  1 unit packed red blood cells 05/18/2018  Ultrasound-guided core biopsy left  posterior pleural mass pending 05/20/2018  Antimicrobials:  IV vancomycin 624 20191 dose IV Levaquin 624 20191 dose   Subjective: In bed.  Patient upset and not been able to eat.  Patient states no significant change with shortness of breath.  No chest pain.  Holding his dentures in his hands.  Objective: Vitals:   05/19/18 1622 05/19/18 1854 05/19/18 2104 05/20/18 0509  BP: 104/69 110/83 91/66 100/61  Pulse: (!) 44 72 77 85  Resp: 18 18 18 20   Temp: 98.1 F (36.7 C) 97.7 F (36.5 C) 98.1 F (36.7 C)   TempSrc: Oral Oral Oral   SpO2: 100% 100% 97% 98%  Weight:      Height:        Intake/Output Summary (Last 24 hours) at 05/20/2018 0953 Last data filed at 05/19/2018 1854 Gross per 24 hour  Intake 1223.1 ml  Output -  Net 1223.1 ml   Filed Weights   05/17/18 1206 05/17/18 1741  Weight: 62.4 kg (137 lb 8 oz) 62.9 kg (138 lb 10.7 oz)    Examination:  General exam: NAD Respiratory system: Decreased breath sounds in the bases left greater than right.  Some scattered crackles in the right base.  No wheezing.  Respiratory effort normal. Cardiovascular system: Regular rate and rhythm no murmurs rubs or gallops.  No JVD.  No lower extremity edema.  Gastrointestinal system: Abdomen is soft, nontender, nondistended, positive bowel sounds.   Central nervous system: Alert and oriented. No focal neurological deficits. Extremities: Symmetric 5 x 5 power. Skin: No rashes, lesions or ulcers Psychiatry: Judgement and insight appear normal. Mood & affect appropriate.     Data Reviewed: I have personally reviewed following labs and imaging studies  CBC: Recent Labs  Lab 05/17/18 1229  05/18/18 0522 05/18/18 0927 05/18/18 1557 05/19/18 0846 05/19/18 2059 05/20/18 0450  WBC 9.0  --  8.2 7.5 8.5 7.7  --  9.4  NEUTROABS 7.9  --   --  6.0  --  6.3  --   --   HGB 8.5*   < > 6.2* 6.4* 7.7* 7.4* 9.8* 10.0*  HCT 27.6*   < > 19.6* 20.4* 24.1* 23.7* 30.2* 31.5*  MCV 99.3  --  98.0  97.6 93.4 94.8  --  92.9  PLT 301  --  257 275 268 261  --  236   < > = values in this interval not displayed.   Basic Metabolic Panel: Recent Labs  Lab 05/17/18 1229 05/17/18 1246 05/18/18 0522 05/19/18 0846 05/20/18 0450  NA 137 136 138 139 140  K 4.2 4.1 4.5 4.1 4.3  CL 96* 93* 102 101 104  CO2 31  --  31 30 30   GLUCOSE 118* 112* 99 105* 114*  BUN 34* 31* 31* 23 23  CREATININE 1.10 1.00 0.93 0.87 0.89  CALCIUM 9.0  --  8.1* 8.5* 8.5*  MG  --   --   --  1.8 1.9   GFR: Estimated Creatinine Clearance: 53 mL/min (by C-G formula based on SCr of 0.89 mg/dL). Liver Function Tests: Recent Labs  Lab 05/17/18 1229  AST 26  ALT 14*  ALKPHOS 100  BILITOT 0.5  PROT 6.5  ALBUMIN 2.9*   No results for input(s): LIPASE, AMYLASE in the last 168 hours. No  results for input(s): AMMONIA in the last 168 hours. Coagulation Profile: Recent Labs  Lab 05/18/18 0927  INR 1.13   Cardiac Enzymes: No results for input(s): CKTOTAL, CKMB, CKMBINDEX, TROPONINI in the last 168 hours. BNP (last 3 results) No results for input(s): PROBNP in the last 8760 hours. HbA1C: No results for input(s): HGBA1C in the last 72 hours. CBG: No results for input(s): GLUCAP in the last 168 hours. Lipid Profile: No results for input(s): CHOL, HDL, LDLCALC, TRIG, CHOLHDL, LDLDIRECT in the last 72 hours. Thyroid Function Tests: No results for input(s): TSH, T4TOTAL, FREET4, T3FREE, THYROIDAB in the last 72 hours. Anemia Panel: Recent Labs    05/18/18 0927  RETICCTPCT 2.7   Sepsis Labs: Recent Labs  Lab 05/17/18 1232  LATICACIDVEN 1.67    Recent Results (from the past 240 hour(s))  Blood Culture (routine x 2)     Status: None (Preliminary result)   Collection Time: 05/17/18 12:30 PM  Result Value Ref Range Status   Specimen Description   Final    BLOOD RIGHT ANTECUBITAL Performed at [facility] [address] [place], [place] [address]    Special Requests   Final     BOTTLES DRAWN AEROBIC AND ANAEROBIC Blood Culture adequate volume Performed at [facility] [address] [place], [place] [address]    Culture   Final    NO GROWTH 3 DAYS Performed at [facility], [place] [address] [place], [place] [address]    Report Status PENDING  Incomplete  Blood Culture (routine x 2)     Status: None (Preliminary result)   Collection Time: 05/17/18 12:59 PM  Result Value Ref Range Status   Specimen Description   Final    BLOOD BLOOD RIGHT FOREARM Performed at [facility] [address] [place], [place] [address]    Special Requests   Final    BOTTLES DRAWN AEROBIC AND ANAEROBIC Blood Culture adequate volume Performed at [facility] [address] [place], [place] [address]    Culture   Final    NO GROWTH 3 DAYS Performed at [facility], [place] [address] [place], [place] [address]    Report Status PENDING  Incomplete  Culture, body fluid-bottle     Status: None (Preliminary result)   Collection Time: 05/17/18  4:55 PM  Result Value Ref Range Status   Specimen Description FLUID PLEURAL LEFT  Final   Special Requests BOTTLES DRAWN AEROBIC AND ANAEROBIC  Final   Culture   Final    NO GROWTH 3 DAYS Performed at [facility], [place] [address], [place], [place] [address]    Report Status PENDING  Incomplete  Gram stain     Status: None   Collection Time: 05/17/18  4:55 PM  Result Value Ref Range Status   Specimen Description FLUID PLEURAL LEFT  Final   Special Requests NONE  Final   Gram Stain   Final    MODERATE WBC PRESENT,BOTH PMN AND MONONUCLEAR NO ORGANISMS SEEN Performed at [facility], [address], [place], [place] [address]    Report Status 05/17/2018 FINAL  Final  MRSA PCR Screening     Status: None   Collection Time: 05/18/18 12:53 AM  Result Value Ref Range Status   MRSA by PCR NEGATIVE NEGATIVE Final    Comment:        The GeneXpert MRSA Assay  (FDA approved for NASAL specimens only), is  one component of a comprehensive MRSA colonization surveillance program. It is not intended to diagnose MRSA infection nor to guide or monitor treatment for MRSA infections. Performed at [facility], [place] [address], [place], [place] [address]          Radiology Studies: Ct Chest W Contrast  Result Date: 05/18/2018 CLINICAL DATA:  History of CHF, remote gastric cancer, hypoxia, recurrent bloody left effusion status post thoracentesis twice EXAM: CT CHEST WITH CONTRAST TECHNIQUE: Multidetector CT imaging of the chest was performed during intravenous contrast administration. CONTRAST:  66mL OMNIPAQUE IOHEXOL 300 MG/ML  SOLN COMPARISON:  05/17/2018, 01/25/2014 FINDINGS: Cardiovascular: Atherosclerosis of the thoracic aorta. No significant aneurysm or dissection. Three-vessel arch anatomy appearing patent. No large central pulmonary embolus or saddle embolus. Native coronary atherosclerosis noted. Aortic valve calcifications present. Heart is normal in size. No pericardial effusion. Mediastinum/Nodes: Abnormal enhancing left hilar, subcarinal, and suspect paraesophageal mediastinal adenopathy. Right hilar adenopathy measures 3.1 x 1.7 cm, image 70. No thyroid abnormality. Trachea remains patent. Central bronchi are patent. No mediastinal hemorrhage or hematoma. Enhancing nodular abnormal soft tissue along the left anterior mediastinum medially and also along the left pericardium diffusely compatible with mediastinal/pleural and likely pericardial tumor involvement. Lungs/Pleura: Background peripheral interstitial fibrotic pattern with basilar honeycombing. Similar areas of apical nodular spiculation and central cavitation compatible with scarring when compared to 2015. Large central left lung mass has obscured margins but roughly measures 6.8 x 8 cm, image 81 series 2. Again, the lung mass appears to be contiguous with abnormal soft  tissue invading the left hilum along the pulmonary arteries, left upper and lower lobe bronchi, as well as the left superior and inferior pulmonary veins. Loculated heterogeneous large left pleural effusion with diffuse pleural enhancement and pleural enhancing nodularity inferiorly. Chest wall nodularity also noted posteriorly, image 112 series 2. Constellation of findings are compatible with central bronchogenic left lung cancer invading the mediastinum with mediastinal and hilar adenopathy as well as diffuse left hemithorax pleural and chest wall metastatic disease. This would be compatible with a stage IV lung cancer. Right lung demonstrates scattered parenchymal scarring and peripheral interstitial fibrosis with basilar honeycombing. Small right pleural effusion noted dependently without loculation. Associated right basilar atelectasis. Upper Abdomen: Postop changes from partial gastrectomy. No upper abdominal adenopathy. Remote cholecystectomy. No acute upper abdominal finding. Musculoskeletal: Limited with motion artifact. Bones are osteopenic. Degenerative changes of the spine and both shoulders. Posterior right tenth and eleventh rib fractures noted. Chronic vertebral plana at T12. No acute compression fracture. Motion artifact across the manubrium and sternum. IMPRESSION: Large obscured left central lung mass measuring up to 8 cm compatible with bronchogenic lung carcinoma with evidence left hilar and mediastinal adenopathy, left hilar central invasion, possible involvement of the left pericardium, and diffuse left hemithorax pleural metastatic nodularity and chest wall nodularity. Associated partial loculated left effusion appearing complex which may be related to the malignant process or residual hemothorax. Chronic background pulmonary interstitial fibrosis Trace right pleural effusion and associated basilar atelectasis Posterior right tenth and eleventh rib fractures Thoracic aortic atherosclerosis and  native coronary atherosclerosis These results will be called to the ordering clinician or representative by the Radiologist Assistant, and communication documented in the PACS or zVision Dashboard. Aortic Atherosclerosis (ICD10-I70.0). Electronically Signed   By: Jerilynn Mages.  Shick M.D.   On: 05/18/2018 13:22        Scheduled Meds: . acetaminophen  650 mg Oral Q6H WA  . ferrous sulfate  325 mg Oral Daily  .  levothyroxine  50 mcg Oral Q0600  . lidocaine  1 patch Transdermal Q24H  . simvastatin  40 mg Oral QHS  . tamsulosin  0.4 mg Oral QHS   Continuous Infusions:   LOS: 3 days    Time spent: 40 minutes    Irine Seal, MD [facility] Pager [phone]  If 7PM-7AM, please contact night-coverage [email or web address] Password [facility] 05/20/2018, 9:53 AM

## 2018-05-21 ENCOUNTER — Encounter (HOSPITAL_COMMUNITY): Payer: Self-pay

## 2018-05-21 ENCOUNTER — Inpatient Hospital Stay (HOSPITAL_COMMUNITY): Payer: Medicare HMO

## 2018-05-21 DIAGNOSIS — Z7189 Other specified counseling: Secondary | ICD-10-CM

## 2018-05-21 DIAGNOSIS — I4891 Unspecified atrial fibrillation: Secondary | ICD-10-CM | POA: Diagnosis not present

## 2018-05-21 DIAGNOSIS — D630 Anemia in neoplastic disease: Secondary | ICD-10-CM

## 2018-05-21 DIAGNOSIS — Z515 Encounter for palliative care: Secondary | ICD-10-CM

## 2018-05-21 LAB — BASIC METABOLIC PANEL
Anion gap: 7 (ref 5–15)
BUN: 23 mg/dL (ref 8–23)
CALCIUM: 8.4 mg/dL — AB (ref 8.9–10.3)
CO2: 28 mmol/L (ref 22–32)
CREATININE: 0.88 mg/dL (ref 0.61–1.24)
Chloride: 105 mmol/L (ref 98–111)
GFR calc non Af Amer: >60 mL/min (ref >60)
GLUCOSE: 100 mg/dL — AB (ref 70–99)
Potassium: 4.2 mmol/L (ref 3.5–5.1)
Sodium: 140 mmol/L (ref 135–145)

## 2018-05-21 LAB — CBC
HEMATOCRIT: 31.4 % — AB (ref 39.0–52.0)
Hemoglobin: 9.6 g/dL — ABNORMAL LOW (ref 13.0–17.0)
MCH: 29.3 pg (ref 26.0–34.0)
MCHC: 30.6 g/dL (ref 30.0–36.0)
MCV: 95.7 fL (ref 78.0–100.0)
Platelets: 221 10*3/uL (ref 150–400)
RBC: 3.28 MIL/uL — AB (ref 4.22–5.81)
RDW: 17.3 % — ABNORMAL HIGH (ref 11.5–15.5)
WBC: 7.1 10*3/uL (ref 4.0–10.5)

## 2018-05-21 MED ORDER — SODIUM CHLORIDE 0.9 % IV BOLUS: 250.0000 mL | Freq: Once | INTRAVENOUS | Status: DC

## 2018-05-21 MED ORDER — SODIUM CHLORIDE 0.9 % IV BOLUS
250.0000 mL | Freq: Once | INTRAVENOUS | Status: AC
  Administered 2018-05-21: 250 mL via INTRAVENOUS

## 2018-05-21 MED ORDER — SODIUM CHLORIDE 0.9 % IV BOLUS
500.0000 mL | Freq: Once | INTRAVENOUS | Status: AC
  Administered 2018-05-21: 500 mL via INTRAVENOUS

## 2018-05-21 MED ORDER — LIDOCAINE HCL 1 % IJ SOLN
INTRAMUSCULAR | Status: AC
  Filled 2018-05-21: qty 20

## 2018-05-21 MED ORDER — SODIUM CHLORIDE 0.9 % IV SOLN
INTRAVENOUS | Status: DC
  Administered 2018-05-21 – 2018-05-26 (×5): via INTRAVENOUS

## 2018-05-21 MED ORDER — MORPHINE SULFATE 10 MG/5ML PO SOLN
5.0000 mg | ORAL | Status: DC | PRN
  Administered 2018-05-21: 5 mg via ORAL
  Filled 2018-05-21 (×2): qty 5

## 2018-05-21 NOTE — Progress Notes (Signed)
PROGRESS NOTE    Kyle Armstrong  JSE:[id number] DOB: August 19, 1930 DOA: 05/17/2018 PCP: Lawerance Cruel, MD   Brief Narrative: Pt. with PMH of chronic diastolic CHF, HLD, BPH; admitted on 05/17/2018, presented with complaint of shortness of breath, was found to have recurrent left pleural effusion secondary to left lung mass. Currently further plan is continue close monitoring and supportive measures as well as follow-up on recommendations from oncology.     Assessment & Plan:   Principal Problem:   Acute hypoxemic respiratory failure (HCC) Active Problems:   Hyperlipidemia   Hypothyroidism   Pleural effusion   Lung mass   Goals of care, counseling/discussion   Advance care planning   Palliative care by specialist   S/P thoracentesis   Acute blood loss anemia   New onset a-fib ([place])  .  Acute on chronic hypoxic respiratory failure. Recurrent left pleural effusion-hemothorax.  Likely malignant Left lung cancer with mediastinal mass.   prior history of gastric carcinoma treated with surgery and in remission. Baseline uses 2 L of oxygen. Status post thoracentesis with 1.3 L of bloody fluid removed 05/18/2018.  Concern for malignant pleural effusion.   Cytology as well as infectious work-up from prior thoracentesis was negative although CT of the chest performed this admission shows large central left lung mass roughly 7 x 8 cm size contiguous with left hilum mass along the pulmonary artery probably involving pleura and pericardium along with paraesophageal, bilateral hilar subcarinal adenopathy.  Improved but persistent left hemithorax compatible with stage IV lung cancer. Oncology consulted and patient seen in consultation by Dr. Benay Spice. Cytology results were negative for malignancy and as such oncology consulted with IR for biopsy of pleural mass which was done 05/20/2018 and biopsy results pending.  Patient noted to have systolic blood pressures in the 80s-90s.  Hemoglobin at  9.6 from 10.0 from 7.4 after transfusion of 2 units packed red blood cells. Follow H&H. Initially thought to be fluid related to CHF and therefore patient was given IV Lasix will hold off on that right now. Even though no active bleeding noted on the CT scan, should the patient has persistent hemodynamic instability or no improvement in anemia may benefit from IR consultation for embolization. Patient with a worsening shortness of breath and chest x-ray with reaccumulation of pleural effusion on the left.  IR to evaluate for ultrasound-guided thoracentesis for symptomatic relief. Pending biopsy results, oncology to determine treatment options at that time.  Discussed with oncology as to whether patient may benefit from radiation oncology evaluation for possible radiation to shrink mass noted on chest x-ray to see if this may help with his respiratory status. Palliative care following.  2.  Acute blood loss anemia. Likely secondary to bloody pleural effusion.  Status post 1 unit packed red blood cells.  Hemoglobin at 9.6 from 10.0 from 7.4.  Patient with systolic blood pressures in the low 80s-90s.  Status post 2 units packed red blood cells.  Follow H&H. Transfusion threshold hemoglobin less than 7 or hemodynamic instability.  3..  New onset A. fib Patient noted to go into atrial fibrillation yesterday evening however rate seems to be controlled.  Likely secondary to acute respiratory issues as in #1.  Patient with systolic blood pressures in the 80s to the 90s.  Patient not a anticoagulation candidate as thoracentesis done was bloody.  Patient status post transfusion of packed red blood cells.  Follow.  4.  Chronic diastolic CHF. Currently euvolemic.  Blood pressure was borderline and  as such antihypertensive medications and diuretics on hold.  Will follow.   5.  Dyslipidemia. Stable.  Continue statin.    6.  BPH. Continue Flomax.   7.  Hypothyroidism. TSH was 1.372 on 04/11/2018.   Continue Synthroid.  8.  Hypertension Blood pressure borderline.  Continue to hold antihypertensive medications.  Follow.  9.  Chronic interstitial fibrosis of bilateral lung parenchyma with honeycombing. Could partly be contributing to patient's hypoxia.  Follow for now.     DVT prophylaxis: SCDs Code Status: DNR Family Communication: Updated patient.  No family at bedside. Disposition Plan: To be determined.  Likely skilled nursing facility.   Consultants:   Oncology: Dr. Benay Spice 05/19/2018  Palliative care: Mariana Kaufman, NP 05/19/2018  Interventional radiology  Procedures:   CT chest 05/18/2018  Chest x-ray 05/17/2018  Ultrasound guided thoracentesis 05/17/2018--1.3 L of dark bloody fluid removed.  Per Rowe Robert, PA 05/17/2018  2 units packed red blood cells pending 05/19/2018  1 unit packed red blood cells 05/18/2018  Ultrasound-guided core biopsy left posterior pleural mass pending 05/20/2018  Ultrasound-guided thoracentesis pending 05/21/2018  Antimicrobials:  IV vancomycin 624 [id number] dose IV Levaquin 624 [id number] dose   Subjective: Patient in bed visibly short of breath.  States no significant change with shortness of breath since admission.  No chest pain.    Objective: Vitals:   05/20/18 2232 05/21/18 0225 05/21/18 0241 05/21/18 0446  BP: (!) 88/57 (!) 86/62 (!) 80/55 (!) 88/61  Pulse: 94 98  78  Resp: 18   20  Temp: 98.2 F (36.8 C)   97.7 F (36.5 C)  TempSrc: Oral   Oral  SpO2: 98% 100%  100%  Weight:      Height:        Intake/Output Summary (Last 24 hours) at 05/21/2018 0942 Last data filed at 05/21/2018 0244 Gross per 24 hour  Intake 365 ml  Output 570 ml  Net -205 ml   Filed Weights   05/17/18 1206 05/17/18 1741  Weight: 62.4 kg (137 lb 8 oz) 62.9 kg (138 lb 10.7 oz)    Examination:  General exam: NAD Respiratory system: Decreased breath sounds on the left.  Some scattered crackles right base.  No wheezing.  Visibly short of breath.   Speaking in full sentences.  Cardiovascular system: Irregularly irregular.  No lower extremity edema.  No JVD.   Gastrointestinal system: Abdomen is nontender, nondistended, soft, positive bowel sounds.  No rebound.  No guarding.   Central nervous system: Alert and oriented. No focal neurological deficits. Extremities: Symmetric 5 x 5 power. Skin: No rashes, lesions or ulcers Psychiatry: Judgement and insight appear normal. Mood & affect appropriate.     Data Reviewed: I have personally reviewed following labs and imaging studies  CBC: Recent Labs  Lab 05/17/18 1229  05/18/18 0927 05/18/18 1557 05/19/18 0846 05/19/18 2059 05/20/18 0450 05/21/18 0454  WBC 9.0   < > 7.5 8.5 7.7  --  9.4 7.1  NEUTROABS 7.9  --  6.0  --  6.3  --   --   --   HGB 8.5*   < > 6.4* 7.7* 7.4* 9.8* 10.0* 9.6*  HCT 27.6*   < > 20.4* 24.1* 23.7* 30.2* 31.5* 31.4*  MCV 99.3   < > 97.6 93.4 94.8  --  92.9 95.7  PLT 301   < > 275 268 261  --  236 221   < > = values in this interval not displayed.   Basic Metabolic Panel:  Recent Labs  Lab 05/17/18 1229 05/17/18 1246 05/18/18 0522 05/19/18 0846 05/20/18 0450 05/21/18 0454  NA 137 136 138 139 140 140  K 4.2 4.1 4.5 4.1 4.3 4.2  CL 96* 93* 102 101 104 105  CO2 31  --  31 30 30 28   GLUCOSE 118* 112* 99 105* 114* 100*  BUN 34* 31* 31* 23 23 23   CREATININE 1.10 1.00 0.93 0.87 0.89 0.88  CALCIUM 9.0  --  8.1* 8.5* 8.5* 8.4*  MG  --   --   --  1.8 1.9  --    GFR: Estimated Creatinine Clearance: 53.6 mL/min (by C-G formula based on SCr of 0.88 mg/dL). Liver Function Tests: Recent Labs  Lab 05/17/18 1229  AST 26  ALT 14*  ALKPHOS 100  BILITOT 0.5  PROT 6.5  ALBUMIN 2.9*   No results for input(s): LIPASE, AMYLASE in the last 168 hours. No results for input(s): AMMONIA in the last 168 hours. Coagulation Profile: Recent Labs  Lab 05/18/18 0927  INR 1.13   Cardiac Enzymes: No results for input(s): CKTOTAL, CKMB, CKMBINDEX, TROPONINI in the  last 168 hours. BNP (last 3 results) No results for input(s): PROBNP in the last 8760 hours. HbA1C: No results for input(s): HGBA1C in the last 72 hours. CBG: No results for input(s): GLUCAP in the last 168 hours. Lipid Profile: No results for input(s): CHOL, HDL, LDLCALC, TRIG, CHOLHDL, LDLDIRECT in the last 72 hours. Thyroid Function Tests: No results for input(s): TSH, T4TOTAL, FREET4, T3FREE, THYROIDAB in the last 72 hours. Anemia Panel: No results for input(s): VITAMINB12, FOLATE, FERRITIN, TIBC, IRON, RETICCTPCT in the last 72 hours. Sepsis Labs: Recent Labs  Lab 05/17/18 1232  LATICACIDVEN 1.67    Recent Results (from the past 240 hour(s))  Blood Culture (routine x 2)     Status: None (Preliminary result)   Collection Time: 05/17/18 12:30 PM  Result Value Ref Range Status   Specimen Description   Final    BLOOD RIGHT ANTECUBITAL Performed at [facility] [address] [place], [place] [address]    Special Requests   Final    BOTTLES DRAWN AEROBIC AND ANAEROBIC Blood Culture adequate volume Performed at [facility] [address], [place], [place] [address]    Culture   Final    NO GROWTH 4 DAYS Performed at [facility], [place] [address], [place], [place] [address]    Report Status PENDING  Incomplete  Blood Culture (routine x 2)     Status: None (Preliminary result)   Collection Time: 05/17/18 12:59 PM  Result Value Ref Range Status   Specimen Description   Final    BLOOD RIGHT FOREARM Performed at [facility], [place] [address], [place], [place] [address]    Special Requests   Final    BOTTLES DRAWN AEROBIC AND ANAEROBIC Blood Culture adequate volume Performed at [facility] [address], [place], [place] [address]    Culture   Final    NO GROWTH 4 DAYS Performed at [facility], [place] [address] [place], [place] [address]    Report Status PENDING  Incomplete  Culture, body  fluid-bottle     Status: None (Preliminary result)   Collection Time: 05/17/18  4:55 PM  Result Value Ref Range Status   Specimen Description FLUID PLEURAL LEFT  Final   Special Requests BOTTLES DRAWN AEROBIC AND ANAEROBIC  Final   Culture   Final  NO GROWTH 4 DAYS Performed at [facility], [place] [address] [place], [place] [address]    Report Status PENDING  Incomplete  Gram stain     Status: None   Collection Time: 05/17/18  4:55 PM  Result Value Ref Range Status   Specimen Description FLUID PLEURAL LEFT  Final   Special Requests NONE  Final   Gram Stain   Final    MODERATE WBC PRESENT,BOTH PMN AND MONONUCLEAR NO ORGANISMS SEEN Performed at [facility], [address] [place], [place] [address]    Report Status 05/17/2018 FINAL  Final  MRSA PCR Screening     Status: None   Collection Time: 05/18/18 12:53 AM  Result Value Ref Range Status   MRSA by PCR NEGATIVE NEGATIVE Final    Comment:        The GeneXpert MRSA Assay (FDA approved for NASAL specimens only), is one component of a comprehensive MRSA colonization surveillance program. It is not intended to diagnose MRSA infection nor to guide or monitor treatment for MRSA infections. Performed at [facility], [place] [address], [place], [place] [address]          Radiology Studies: Dg Chest Port 1 View  Result Date: 05/20/2018 CLINICAL DATA:  Shortness of breath, chronic CHF, severe kyphosis EXAM: PORTABLE CHEST 1 VIEW COMPARISON:  CT chest of 05/18/2018 and chest x-ray of 05/17/2017 FINDINGS: The left pleural effusion has reaccumulated after thoracentesis with a large left pleural effusion now present with considerable loss of volume of the left lung and mediastinal shift to the right. Coarse lung markings remain throughout the right lung. Heart size is difficult to assess. No bony abnormality is seen. IMPRESSION: Interval re-accumulation of large left pleural effusion with almost  complete compression of the left lung and mediastinal shift to the right. Electronically Signed   By: Ivar Drape M.D.   On: 05/20/2018 16:19   [place] Core Biopsy (soft Tissue)  Result Date: 05/20/2018 INDICATION: 84 year old male with a history of gastric cancer and new onset shortness of breath. CT imaging demonstrates extensive pleural disease, a large left pleural effusion and left-sided lung masses. Findings are concerning for metastatic gastric cancer versus new primary lung cancer. He presents for ultrasound-guided biopsy of 1 of the posterior pleural based intercostal masses. EXAM: Ultrasound-guided core biopsy, soft tissue MEDICATIONS: None. ANESTHESIA/SEDATION: Moderate (conscious) sedation was employed during this procedure. A total of Versed 0.5 mg and Fentanyl 25 mcg was administered intravenously. Moderate Sedation Time: 10 minutes. The patient's level of consciousness and vital signs were monitored continuously by radiology nursing throughout the procedure under my direct supervision. FLUOROSCOPY TIME:  Fluoroscopy Time: 0 minutes 0 seconds (0 mGy). COMPLICATIONS: None immediate. PROCEDURE: Informed written consent was obtained from the patient after a thorough discussion of the procedural risks, benefits and alternatives. All questions were addressed. A timeout was performed prior to the initiation of the procedure. Ultrasound was used to interrogate the left posterior intercostal spaces. Ultimately, a approximately 2.6 x 1.4 cm hypoechoic soft tissue mass was successfully identified. The overlying skin was appropriately prepped and draped in the standard sterile fashion using chlorhexidine skin prep. Local anesthesia was attained by infiltration with 1% lidocaine. A small dermatotomy was made. Under real-time sonographic guidance, multiple 18 gauge core biopsies were obtained using a [place] automated biopsy device. The biopsy specimens were placed in formalin and delivered to pathology for  further analysis. Post biopsy ultrasound imaging demonstrates no evidence of immediate  complication. The patient tolerated the procedure well. IMPRESSION: Successful ultrasound-guided core biopsy of left posterior pleural-based intercostal mass. Signed, Criselda Peaches, MD [facility] Radiology Electronically Signed   By: Jacqulynn Cadet M.D.   On: 05/20/2018 13:01        Scheduled Meds: . acetaminophen  650 mg Oral Q6H WA  . ferrous sulfate  325 mg Oral Daily  . levothyroxine  50 mcg Oral Q0600  . lidocaine  1 patch Transdermal Q24H  . simvastatin  40 mg Oral QHS  . tamsulosin  0.4 mg Oral QHS   Continuous Infusions: . sodium chloride 100 mL/hr at 05/21/18 0836  . sodium chloride       LOS: 4 days    Time spent: 40 minutes    Irine Seal, MD [facility] Pager [phone]  If 7PM-7AM, please contact night-coverage [email or web address] Password [facility] 05/21/2018, 9:42 AM

## 2018-05-21 NOTE — Progress Notes (Addendum)
Patient's BP=77/55 and rechecked manually was 78/48. [place] NP paged and she responded with a new order for NS 250 mL  bolus. Will give and continue to monitor.

## 2018-05-21 NOTE — Progress Notes (Signed)
Daily Progress Note   Patient Name: Kyle Armstrong       Date: 05/21/2018 DOB: May 25, 1930  Age: 84 y.o. MRN#: [id number] Attending Physician: Eugenie Filler, MD Primary Care Physician: Lawerance Cruel, MD Admit Date: 05/17/2018  Reason for Consultation/Follow-up: Establishing goals of care, Non pain symptom management and Pain control   Subjective: Met with patient, his son, [name], and daughter Margarita Grizzle (by phone). Patient and son had just met with Dr. Benay Spice as well. Preliminary biopsy report indicates mass is cancer- uncertain if it is [place] or [place]. Per Dr. Benay Spice- if it is [place]- he has very good chance (80%) of mass responding to chemotherapy and shrinking which should help his breathing status, and decrease fluid re accumulations. If it is [facility]- then his prognosis is much worse and Dr. Benay Spice would not recommend treating with chemotherapy.  Mr. Bosher is in agreement with this plan. He would like to await biopsy results.  He continues to have ongoing pain in his R lower back (in area of apparent posterior Rib fractures), and continued SOB. Pain is limiting his involvement in PT/OT and his ability to take deep breathes.    ROS  Length of Stay: 4  Current Medications: Scheduled Meds:  . acetaminophen  650 mg Oral Q6H WA  . ferrous sulfate  325 mg Oral Daily  . levothyroxine  50 mcg Oral Q0600  . lidocaine  1 patch Transdermal Q24H  . lidocaine      . simvastatin  40 mg Oral QHS  . tamsulosin  0.4 mg Oral QHS    Continuous Infusions: . sodium chloride 100 mL/hr at 05/21/18 0836  . sodium chloride      PRN Meds: metoprolol tartrate, morphine, ondansetron **OR** ondansetron (ZOFRAN) IV  Physical Exam  Constitutional:  frail  Pulmonary/Chest:  labored  Neurological: He is  alert.  Hard of hearing  Skin: Skin is warm and dry.  Psychiatric: He has a normal mood and affect. His behavior is normal.  Nursing note and vitals reviewed.           Vital Signs: BP 99/76 (BP Location: Right Arm)   Pulse 89   Temp 98.1 F (36.7 C) (Oral)   Resp 20   Ht '5\' 7"'$  (1.702 m)   Wt 62.9 kg (138 lb 10.7 oz)  SpO2 100%   BMI 21.72 kg/m  SpO2: SpO2: 100 % O2 Device: O2 Device: Nasal Cannula O2 Flow Rate: O2 Flow Rate (L/min): 3 L/min  Intake/output summary:   Intake/Output Summary (Last 24 hours) at 05/21/2018 1603 Last data filed at 05/21/2018 1000 Gross per 24 hour  Intake 360 ml  Output 295 ml  Net 65 ml   LBM: Last BM Date: 05/17/18 Baseline Weight: Weight: 62.4 kg (137 lb 8 oz) Most recent weight: Weight: 62.9 kg (138 lb 10.7 oz)       Palliative Assessment/Data: PPS: 30%      Patient Active Problem List   Diagnosis Date Noted  . New onset a-fib ([place]) 05/21/2018  . Lung mass   . Goals of care, counseling/discussion   . Advance care planning   . Palliative care by specialist   . S/P thoracentesis   . Acute blood loss anemia   . Acute hypoxemic respiratory failure ([place]) 05/17/2018  . Pleural effusion 05/17/2018  . Acute congestive heart failure ([place]) 04/11/2018  . Acute respiratory failure with hypoxia ([place]) 06/21/2017  . Sarcoidosis of lung ([place]) 06/21/2017  . Hyponatremia 06/21/2017  . Congestive heart failure ([place])   . Malfunctioning jejunostomy tube ([place]) 05/15/2014  . Acute esophagitis 02/28/2014  . Diarrhea 02/28/2014  . HOH (hard of hearing) 02/18/2014  . Severe protein-calorie malnutrition ([place]) 02/18/2014  . Jejunostomy tube present ([place]) 02/17/2014  . 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 Hyperlipidemia   . Pernicious anemia   . Hypothyroidism   . Arthritis   . SVT (supraventricular tachycardia) ([place]) 06/23/2012    Palliative Care Assessment & Plan   Patient  Profile: 84 y.o. male  with past medical history of CHF, hyperlipidemia, BPH, gastric cancer, s/p recent hospitalization 5/18-5/24 for CHF (w/ L sided pleural effusion req thoracentesis) and d/c to [facility] for rehab- admitted on 05/17/2018 with hypoxia. Workup reveals large pleural effusion, large L lung mass (8cm, invading hilum, possible pericardial involvement) s/p thoracentesis 1.5 L with improvement in respiratory status. Posterior tenth and 11th rib fractures also noted on CT scan. Palliative medicine consulted for [place].    Assessment/Recommendations/Plan   Continue current level of care  Morphine '5mg'$  liquid po q2hr prn for pain r/t rib fractures or SOB  PMT will continue to follow and discuss [place]  Patient is open to chemotherapy if offered, he is accepting of hospice if pathology report reveals NSCLC  He and family desire SNF at discharge if patient continues to improve with chemotherapy- if he is not candidate for chemo- then he will likely be a residential Hospice candidate  Goals of Care and Additional Recommendations:  Limitations on Scope of Treatment: Full Scope Treatment     Code Status:  DNR  Prognosis:   Unable to determine  Discharge Planning:  To Be Determined  Care plan was discussed with patient and family. Thank you for allowing the Palliative Medicine Team to assist in the care of this patient.   Time In: 1530 Time Out: 1610 Total Time 40 mins Prolonged Time Billed Yes      Greater than 50%  of this time was spent counseling and coordinating care related to the above assessment and plan.  Mariana Kaufman, AGNP-C Palliative Medicine   Please contact Palliative Medicine Team phone at [phone] for questions and concerns.

## 2018-05-21 NOTE — Progress Notes (Addendum)
IP PROGRESS NOTE  Subjective:   Kyle Armstrong continues to have dyspnea.  He underwent biopsy of the left chest wall mass yesterday. He appears unchanged. Objective: Vital signs in last 24 hours: Blood pressure (!) 88/61, pulse 78, temperature 97.7 F (36.5 C), temperature source Oral, resp. rate 20, height _0  (1.702 m), weight 138 lb 10.7 oz (62.9 kg), SpO2 100 %.  Intake/Output from previous day: 06/27 0701 - 06/28 0700 In: 365 [P.O.:365] Out: 570 [Urine:570]  Physical Exam:  HEENT:no thrush Lungs: Decreased breath sounds at the left compared to the right chest, inspiratory rales at the right lower chest Cardiac: Irregular Extremities: No leg edema    Lab Results: Recent Labs    05/20/18 0450 05/21/18 0454  WBC 9.4 7.1  HGB 10.0* 9.6*  HCT 31.5* 31.4*  PLT 236 221    BMET Recent Labs    05/20/18 0450 05/21/18 0454  NA 140 140  K 4.3 4.2  CL 104 105  CO2 30 28  GLUCOSE 114* 100*  BUN 23 23  CREATININE 0.89 0.88  CALCIUM 8.5* 8.4*    No results found for: CEA1  Studies/Results: Dg Chest Port 1 View  Result Date: 05/20/2018 CLINICAL DATA:  Shortness of breath, chronic CHF, severe kyphosis EXAM: PORTABLE CHEST 1 VIEW COMPARISON:  CT chest of 05/18/2018 and chest x-ray of 05/17/2017 FINDINGS: The left pleural effusion has reaccumulated after thoracentesis with a large left pleural effusion now present with considerable loss of volume of the left lung and mediastinal shift to the right. Coarse lung markings remain throughout the right lung. Heart size is difficult to assess. No bony abnormality is seen. IMPRESSION: Interval re-accumulation of large left pleural effusion with almost complete compression of the left lung and mediastinal shift to the right. Electronically Signed   By: Ivar Drape M.D.   On: 05/20/2018 16:19   [place] Core Biopsy (soft Tissue)  Result Date: 05/20/2018 INDICATION: 84 year old male with a history of gastric cancer and new onset shortness  of breath. CT imaging demonstrates extensive pleural disease, a large left pleural effusion and left-sided lung masses. Findings are concerning for metastatic gastric cancer versus new primary lung cancer. He presents for ultrasound-guided biopsy of 1 of the posterior pleural based intercostal masses. EXAM: Ultrasound-guided core biopsy, soft tissue MEDICATIONS: None. ANESTHESIA/SEDATION: Moderate (conscious) sedation was employed during this procedure. A total of Versed 0.5 mg and Fentanyl 25 mcg was administered intravenously. Moderate Sedation Time: 10 minutes. The patient's level of consciousness and vital signs were monitored continuously by radiology nursing throughout the procedure under my direct supervision. FLUOROSCOPY TIME:  Fluoroscopy Time: 0 minutes 0 seconds (0 mGy). COMPLICATIONS: None immediate. PROCEDURE: Informed written consent was obtained from the patient after a thorough discussion of the procedural risks, benefits and alternatives. All questions were addressed. A timeout was performed prior to the initiation of the procedure. Ultrasound was used to interrogate the left posterior intercostal spaces. Ultimately, a approximately 2.6 x 1.4 cm hypoechoic soft tissue mass was successfully identified. The overlying skin was appropriately prepped and draped in the standard sterile fashion using chlorhexidine skin prep. Local anesthesia was attained by infiltration with 1% lidocaine. A small dermatotomy was made. Under real-time sonographic guidance, multiple 18 gauge core biopsies were obtained using a [employer] automated biopsy device. The biopsy specimens were placed in formalin and delivered to pathology for further analysis. Post biopsy ultrasound imaging demonstrates no evidence of immediate complication. The patient tolerated the procedure well. IMPRESSION: Successful ultrasound-guided core  biopsy of left posterior pleural-based intercostal mass. Signed, Criselda Peaches, MD [facility] Electronically Signed   By: Jacqulynn Cadet M.D.   On: 05/20/2018 13:01    Medications: I have reviewed the patient's current medications.  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   Initiation radiation and concurrent Xeloda 11/10/2013, completed 12/21/2013   Restaging CTs of the chest, abdomen, and pelvis on 01/25/2014 with persistent antral thickening, no evidence of metastatic disease.   Distal gastrectomy with Billroth II anastomosis 02/15/2014 (y pT1b, y pN0).  CT scans 03/14/2016 for evaluation of mid abdominal and back pain-no acute findings within the abdomen or pelvis. Postoperative changes identified compatible distal gastrectomy with gastrojejunostomy.  2. Diffuse "gastritis "change noted on endoscopies 08/19/2013 and 10/06/2013 with biopsies confirming atrophic gastritis. Low and high-grade dysplasia were noted on the biopsy 08/19/2013  3.  Anemia-likely secondary to chronic disease, malnutrition, and potentially metastatic carcinoma involving the bone marrow 4. History of anorexia/weight loss.  5. History of colon polyp.  6. History of "sarcoidosis", pulmonary fibrosis noted on the chest CT 10/27/2013.  7. History of tobacco use.  8. History of arrhythmia, status post an ablation August 2013.  9.ChronicBack pain-most likely secondary to a benign musculoskeletal condition 10.  Admission 05/17/2018 with respiratory failure  Left pleural effusion on chest x-ray 04/10/2018, left thoracentesis 04/13/2018-bloody fluid, negative cytology  Large left effusion on admission chest x-ray, cytology negative  CT chest 05/18/2018- central left lung mass, left hilar/mediastinal adenopathy, left pleural nodularity   Kyle Armstrong underwent biopsy of a left chest wall mass yesterday.  The pathology is pending.   He continues to have dyspnea.  I discussed the case with Dr. Grandville Silos.  He will ask for a therapeutic thoracentesis today.  I will follow-up on the pathology from the chest wall biopsy.  I discussed the differential diagnosis with Mr. Demond.  He understands most likely diagnosis is lung cancer.  We will consider a trial of systemic chemotherapy if a diagnosis of small cell lung cancer is confirmed.  We will consider Hospice care if he is diagnosed with non-small cell lung cancer or recurrent gastric cancer.   Recommendations: 1. Therapeutic left thoracentesis 2.  I will attempt to get a preliminary report on the chest wall biopsy today 3.  Please call Oncology as needed over the weekend, I will check on him 05/24/2018     LOS: 4 days   Betsy Coder, MD   05/21/2018, 7:34 AM Addendum: I returned to see Mr. Kalla at approximately 3:15 PM.  He underwent a thoracentesis for 750 cc of fluid this afternoon.  He reports improvement in dyspnea following the procedure.  His son is at the bedside.  I received a preliminary pathology result.  The left chest wall biopsy confirms a malignancy.  The pathologist indicates the tissue does not appear suggestive of recurrent gastric cancer.  He indicates there is a chance this is small cell carcinoma.  Additional immunohistochemical stains will be resulted on 05/24/2018.  I discussed the differential diagnosis with Mr. Lutes and his son.  They understand the high chance of clinical improvement with chemotherapy if he has small cell lung cancer.  He would like to proceed with chemotherapy if this diagnosis is confirmed.  If he is confirmed to have non-small cell lung cancer he will most likely decide on hospice care.  Dr. Lindi Adie will check on Mr.  Edgerly over the weekend and initiate etoposide/carboplatin chemotherapy if his condition deteriorates.  I reviewed potential toxicities associated with etoposide and carboplatin with Mr. Reaume and his son.

## 2018-05-21 NOTE — Progress Notes (Signed)
NP on call was notified regarding patient's low blood pressure, 80/55 manual. New order received to give a 250 cc bolus. Will continue to monitor patient.

## 2018-05-21 NOTE — ED Provider Notes (Signed)
[facility] Provider Note   CSN: [id number] Arrival date & time: 05/17/18  1148     History   Chief Complaint Chief Complaint  Patient presents with  . Pneumonia    HPI Kyle Armstrong is a 84 y.o. male.  Patient presents with shortness of breath patient.   Patient has history of congestive heart failure  The history is provided by the patient. No language interpreter was used.  Shortness of Breath  This is a recurrent problem. The current episode started more than 2 days ago. The problem has not changed since onset.Pertinent negatives include no fever, no headaches, no cough, no chest pain, no abdominal pain and no rash. Precipitated by: Unknown. Risk factors: Heart failure. He has tried nothing for the symptoms.    Past Medical History:  Diagnosis Date  . Allergy   . Arthritis   . Eczema   . Gastric cancer ([place]) 10/24/2013  . Hx of radiation therapy 11/10/13-12/21/13   gastric ca, total 50.4Gy  . Hyperlipidemia   . Hypothyroidism   . Lazy eye of right side   . Pernicious anemia   . 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 New onset a-fib ([place]) 05/21/2018  . Lung mass   . Goals of care, counseling/discussion   . Advance care planning   . Palliative care by specialist   . S/P thoracentesis   . Acute blood loss anemia   . Acute hypoxemic respiratory failure ([place]) 05/17/2018  . Pleural effusion 05/17/2018  . Acute congestive heart failure ([place]) 04/11/2018  . Acute respiratory failure with hypoxia ([place]) 06/21/2017  . 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 Hyponatremia 06/21/2017  . Congestive heart failure ([place])   . Malfunctioning jejunostomy tube ([place]) 05/15/2014  . Acute esophagitis 02/28/2014  . Diarrhea 02/28/2014  . HOH (hard of hearing) 02/18/2014  . Severe protein-calorie malnutrition ([place]) 02/18/2014  . Jejunostomy  tube present ([place]) 02/17/2014  . 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 Hyperlipidemia   . Pernicious anemia   . Hypothyroidism   . Arthritis   . SVT (supraventricular tachycardia) ([place]) 06/23/2012    Past Surgical History:  Procedure Laterality Date  . EP study and ablation  07/16/12   slow pathway ablation for AVNRT by DR Allred  . HERNIA REPAIR  1980's   inguinal done x 2  . IR THORACENTESIS ASP PLEURAL SPACE W/IMG GUIDE  04/13/2018  . LAPAROSCOPIC CHOLECYSTECTOMY W/ CHOLANGIOGRAPHY  2011  . LAPAROSCOPIC PARTIAL GASTRECTOMY N/A 02/15/2014   Procedure: LAPAROSCOPIC diagnositc, distal gastrostomy  feeding jejunostomy;  Surgeon: Stark Klein, MD;  Location: [facility];  Service: General;  Laterality: N/A;  . LAPAROSCOPIC [place] Bilateral 2007   bil recurrent inguinal hernias  . SUPRAVENTRICULAR TACHYCARDIA ABLATION Bilateral 07/16/2012   Procedure: SUPRAVENTRICULAR TACHYCARDIA ABLATION;  Surgeon: Thompson Grayer, MD;  Location: [facility] CATH LAB;  Service: Cardiovascular;  Laterality: Bilateral;        Home Medications    Prior to Admission medications   Medication Sig Start Date End Date Taking? Authorizing Provider  acetaminophen (TYLENOL) 500 MG tablet Take 1,000 mg by mouth every 8 (eight) hours as needed (For pain.).   Yes [provider]  albuterol (PROVENTIL HFA;VENTOLIN HFA) 108 (90 Base) MCG/ACT inhaler Inhale 2 puffs into the lungs every 6 (six) hours as needed  for wheezing or shortness of breath. 04/16/18  Yes Emokpae, Courage, MD  Amino Acids-Protein Hydrolys (FEEDING SUPPLEMENT, PRO-STAT SUGAR FREE 64,) LIQD Take 30 mLs by mouth daily.   Yes [provider]  aspirin 81 MG chewable tablet Chew 81 mg by mouth daily.   Yes [provider]  cyanocobalamin (,VITAMIN B-12,) 1000 MCG/ML injection Inject 1,000 mcg into the muscle every 30 (thirty) days.   Yes [provider]  ferrous sulfate 325 (65 FE) MG EC tablet Take 325 mg by mouth daily.  06/16/14  Yes Ladell Pier, MD  furosemide (LASIX) 40 MG tablet Take 40 mg by mouth.   Yes [provider]  levothyroxine (SYNTHROID, LEVOTHROID) 50 MCG tablet Take 1 tablet (50 mcg total) by mouth daily before breakfast. 04/16/18  Yes Emokpae, Courage, MD  metoprolol tartrate (LOPRESSOR) 25 MG tablet Take 0.5 tablets (12.5 mg total) by mouth daily. Patient taking differently: Take 12.5 mg by mouth 2 (two) times daily. Hold if SBP is less than 100 or DBP is less than 50. 04/23/18  Yes Fay Records, MD  potassium chloride SA (K-DUR,KLOR-CON) 20 MEQ tablet Take 1 tablet (20 mEq total) by mouth daily. 06/26/17  Yes Rai, Ripudeep K, MD  simvastatin (ZOCOR) 40 MG tablet Take 1 tablet (40 mg total) by mouth every evening. Patient taking differently: Take 40 mg by mouth at bedtime.  04/16/18  Yes Emokpae, Courage, MD  tamsulosin (FLOMAX) 0.4 MG CAPS capsule Take 1 capsule (0.4 mg total) by mouth daily. Patient taking differently: Take 0.4 mg by mouth at bedtime.  04/16/18  Yes Roxan Hockey, MD    Family History Family History  Problem Relation Age of Onset  . Heart attack Mother   . Heart attack Father   . Hypertension Unknown     Social History Social History   Tobacco Use  . Smoking status: Former Smoker    Packs/day: 1.00    Years: 30.00    Pack years: 30.00    Types: Cigarettes    Last attempt to quit: 11/24/1990    Years since quitting: 27.5  . Smokeless tobacco: Never Used  Substance Use Topics  . Alcohol use: No  . Drug use: No     Allergies   Lipitor [atorvastatin]; Amoxicillin; and Augmentin [amoxicillin-pot clavulanate]   Review of Systems Review of Systems  Constitutional: Negative for appetite change, fatigue and fever.  HENT: Negative for congestion, ear discharge and sinus pressure.   Eyes: Negative for discharge.  Respiratory: Positive for shortness of breath.  Negative for cough.   Cardiovascular: Negative for chest pain.  Gastrointestinal: Negative for abdominal pain and diarrhea.  Genitourinary: Negative for frequency and hematuria.  Musculoskeletal: Negative for back pain.  Skin: Negative for rash.  Neurological: Negative for seizures and headaches.  Psychiatric/Behavioral: Negative for hallucinations.     Physical Exam Updated Vital Signs BP 99/76 (BP Location: Right Arm)   Pulse 89   Temp 98.1 F (36.7 C) (Oral)   Resp 20   Ht 5\' 7"  (0.347 m)   Wt 62.9 kg (138 lb 10.7 oz)   SpO2 100%   BMI 21.72 kg/m   Physical Exam  Constitutional: He is oriented to person, place, and time. He appears well-developed.  HENT:  Head: Normocephalic.  Eyes: Conjunctivae and EOM are normal. No scleral icterus.  Neck: Neck supple. No thyromegaly present.  Cardiovascular: Normal rate and regular rhythm. Exam reveals no gallop and no friction rub.  No murmur heard. Pulmonary/Chest:  No stridor. He has no wheezes. He has no rales. He exhibits no tenderness.  Decreased breath sounds on left side  Abdominal: He exhibits no distension. There is no tenderness. There is no rebound.  Musculoskeletal: Normal range of motion. He exhibits no edema.  Lymphadenopathy:    He has no cervical adenopathy.  Neurological: He is oriented to person, place, and time. He exhibits normal muscle tone. Coordination normal.  Skin: No rash noted. No erythema.  Psychiatric: He has a normal mood and affect. His behavior is normal.     ED Treatments / Results  Labs (all labs ordered are listed, but only abnormal results are displayed) Labs Reviewed  COMPREHENSIVE METABOLIC PANEL - Abnormal; Notable for the following components:      Result Value   Chloride 96 (*)    Glucose, Bld 118 (*)    BUN 34 (*)    Albumin 2.9 (*)    ALT 14 (*)    GFR calc non Af Amer 59 (*)    All other components within normal limits  CBC WITH DIFFERENTIAL/PLATELET - Abnormal; Notable for the  following components:   RBC 2.78 (*)    Hemoglobin 8.5 (*)    HCT 27.6 (*)    All other components within normal limits  URINALYSIS, ROUTINE W REFLEX MICROSCOPIC - Abnormal; Notable for the following components:   APPearance HAZY (*)    All other components within normal limits  LACTATE DEHYDROGENASE, PLEURAL OR PERITONEAL FLUID - Abnormal; Notable for the following components:   LD, Fluid 476 (*)    All other components within normal limits  BODY FLUID CELL COUNT WITH DIFFERENTIAL - Abnormal; Notable for the following components:   Color, Fluid RED (*)    Appearance, Fluid TURBID (*)    WBC, Fluid 1,066 (*)    Neutrophil Count, Fluid 34 (*)    Monocyte-Macrophage-Serous Fluid 8 (*)    All other components within normal limits  CBC - Abnormal; Notable for the following components:   RBC 2.00 (*)    Hemoglobin 6.2 (*)    HCT 19.6 (*)    All other components within normal limits  BASIC METABOLIC PANEL - Abnormal; Notable for the following components:   BUN 31 (*)    Calcium 8.1 (*)    All other components within normal limits  CBC WITH DIFFERENTIAL/PLATELET - Abnormal; Notable for the following components:   RBC 2.09 (*)    Hemoglobin 6.4 (*)    HCT 20.4 (*)    All other components within normal limits  RETICULOCYTES - Abnormal; Notable for the following components:   RBC. 2.09 (*)    All other components within normal limits  CBC - Abnormal; Notable for the following components:   RBC 2.58 (*)    Hemoglobin 7.7 (*)    HCT 24.1 (*)    RDW 16.9 (*)    All other components within normal limits  CBC WITH DIFFERENTIAL/PLATELET - Abnormal; Notable for the following components:   RBC 2.50 (*)    Hemoglobin 7.4 (*)    HCT 23.7 (*)    RDW 17.2 (*)    Lymphs Abs 0.5 (*)    All other components within normal limits  BASIC METABOLIC PANEL - Abnormal; Notable for the following components:   Glucose, Bld 105 (*)    Calcium 8.5 (*)    All other components within normal limits    HEMOGLOBIN AND HEMATOCRIT, BLOOD - Abnormal; Notable for the following components:   Hemoglobin  9.8 (*)    HCT 30.2 (*)    All other components within normal limits  CBC - Abnormal; Notable for the following components:   RBC 3.39 (*)    Hemoglobin 10.0 (*)    HCT 31.5 (*)    RDW 17.9 (*)    All other components within normal limits  BASIC METABOLIC PANEL - Abnormal; Notable for the following components:   Glucose, Bld 114 (*)    Calcium 8.5 (*)    All other components within normal limits  CBC - Abnormal; Notable for the following components:   RBC 3.28 (*)    Hemoglobin 9.6 (*)    HCT 31.4 (*)    RDW 17.3 (*)    All other components within normal limits  BASIC METABOLIC PANEL - Abnormal; Notable for the following components:   Glucose, Bld 100 (*)    Calcium 8.4 (*)    All other components within normal limits  I-STAT CHEM 8, ED - Abnormal; Notable for the following components:   Chloride 93 (*)    BUN 31 (*)    Glucose, Bld 112 (*)    Hemoglobin 8.8 (*)    HCT 26.0 (*)    All other components within normal limits  CULTURE, BLOOD (ROUTINE X 2)  CULTURE, BLOOD (ROUTINE X 2)  CULTURE, BODY FLUID-BOTTLE  GRAM STAIN  MRSA PCR SCREENING  PROTEIN, PLEURAL OR PERITONEAL FLUID  GLUCOSE, PLEURAL OR PERITONEAL FLUID  PH, BODY FLUID  LACTATE DEHYDROGENASE  FIBRINOGEN  PROTIME-INR  MAGNESIUM  MAGNESIUM  CBC WITH DIFFERENTIAL/PLATELET  I-STAT CG4 LACTIC ACID, ED  TYPE AND SCREEN  PREPARE RBC (CROSSMATCH)  PREPARE RBC (CROSSMATCH)  CYTOLOGY - NON PAP  SURGICAL PATHOLOGY    EKG EKG Interpretation  Date/Time:  Monday May 17 2018 12:07:09 EDT Ventricular Rate:  80 PR Interval:    QRS Duration: 114 QT Interval:  384 QTC Calculation: 443 R Axis:   -9 Text Interpretation:  Accelerated junctional rhythm Borderline intraventricular conduction delay Borderline low voltage, extremity leads RSR' in V1 or V2, right VCD or RVH Confirmed by Orpah Greek [phone]) on  05/18/2018 2:39:29 PM   Radiology Dg Chest 1 View  Result Date: 05/21/2018 CLINICAL DATA:  Post thoracentesis Post thoracentesis EXAM: CHEST  1 VIEW COMPARISON:  05/20/2018, 05/18/2018 CT FINDINGS: The heart size is enlarged but difficult to assess given the significant opacity in the lung. There is a moderate loculated LEFT-sided pleural effusion. Aeration in the LEFT lung is limited to the apex, similar to prior studies. There are prominent interstitial markings in the RIGHT lung which appear stable. Chronic change is identified in the LEFT shoulder. IMPRESSION: Persistent significant loculated LEFT pleural effusion and LEFT-sided opacity. Stable appearance of the RIGHT lung. Electronically Signed   By: Nolon Nations M.D.   On: 05/21/2018 13:53   Dg Chest Port 1 View  Result Date: 05/20/2018 CLINICAL DATA:  Shortness of breath, chronic CHF, severe kyphosis EXAM: PORTABLE CHEST 1 VIEW COMPARISON:  CT chest of 05/18/2018 and chest x-ray of 05/17/2017 FINDINGS: The left pleural effusion has reaccumulated after thoracentesis with a large left pleural effusion now present with considerable loss of volume of the left lung and mediastinal shift to the right. Coarse lung markings remain throughout the right lung. Heart size is difficult to assess. No bony abnormality is seen. IMPRESSION: Interval re-accumulation of large left pleural effusion with almost complete compression of the left lung and mediastinal shift to the right. Electronically Signed   By:  Ivar Drape M.D.   On: 05/20/2018 16:19   [place] Core Biopsy (soft Tissue)  Result Date: 05/20/2018 INDICATION: 84 year old male with a history of gastric cancer and new onset shortness of breath. CT imaging demonstrates extensive pleural disease, a large left pleural effusion and left-sided lung masses. Findings are concerning for metastatic gastric cancer versus new primary lung cancer. He presents for ultrasound-guided biopsy of 1 of the posterior pleural  based intercostal masses. EXAM: Ultrasound-guided core biopsy, soft tissue MEDICATIONS: None. ANESTHESIA/SEDATION: Moderate (conscious) sedation was employed during this procedure. A total of Versed 0.5 mg and Fentanyl 25 mcg was administered intravenously. Moderate Sedation Time: 10 minutes. The patient's level of consciousness and vital signs were monitored continuously by radiology nursing throughout the procedure under my direct supervision. FLUOROSCOPY TIME:  Fluoroscopy Time: 0 minutes 0 seconds (0 mGy). COMPLICATIONS: None immediate. PROCEDURE: Informed written consent was obtained from the patient after a thorough discussion of the procedural risks, benefits and alternatives. All questions were addressed. A timeout was performed prior to the initiation of the procedure. Ultrasound was used to interrogate the left posterior intercostal spaces. Ultimately, a approximately 2.6 x 1.4 cm hypoechoic soft tissue mass was successfully identified. The overlying skin was appropriately prepped and draped in the standard sterile fashion using chlorhexidine skin prep. Local anesthesia was attained by infiltration with 1% lidocaine. A small dermatotomy was made. Under real-time sonographic guidance, multiple 18 gauge core biopsies were obtained using a [employer] automated biopsy device. The biopsy specimens were placed in formalin and delivered to pathology for further analysis. Post biopsy ultrasound imaging demonstrates no evidence of immediate complication. The patient tolerated the procedure well. IMPRESSION: Successful ultrasound-guided core biopsy of left posterior pleural-based intercostal mass. Signed, Criselda Peaches, MD [facility] Radiology Electronically Signed   By: Jacqulynn Cadet M.D.   On: 05/20/2018 13:01   US Thoracentesis Asp Pleural Space W/img Guide  Result Date: 05/21/2018 INDICATION: Patient with history of CHF, gastric cancer 2014,  dyspnea, recurrent loculated left pleural effusion. Request made for therapeutic left thoracentesis. EXAM: ULTRASOUND GUIDED THERAPEUTIC LEFT THORACENTESIS MEDICATIONS: None COMPLICATIONS: None immediate. PROCEDURE: An ultrasound guided thoracentesis was thoroughly discussed with the patient and questions answered. The benefits, risks, alternatives and complications were also discussed. The patient understands and wishes to proceed with the procedure. Written consent was obtained. Ultrasound was performed to localize and mark an adequate pocket of fluid in the left chest. The area was then prepped and draped in the normal sterile fashion. 1% Lidocaine was used for local anesthesia. Under ultrasound guidance a 6 Fr Safe-T-Centesis catheter was introduced. Thoracentesis was performed. The catheter was removed and a dressing applied. FINDINGS: A total of approximately 750 cc of bloody fluid was removed. The effusion is multiloculated. IMPRESSION: Successful ultrasound guided therapeutic left thoracentesis yielding 750 cc of pleural fluid. Read by: Rowe Robert, PA-C Electronically Signed   By: Corrie Mckusick D.O.   On: 05/21/2018 13:44    Procedures Procedures (including critical care time)  Medications Ordered in ED Medications  ferrous sulfate tablet 325 mg (325 mg Oral Given 05/21/18 0835)  levothyroxine (SYNTHROID, LEVOTHROID) tablet 50 mcg (50 mcg Oral Given 05/21/18 0550)  simvastatin (ZOCOR) tablet 40 mg (40 mg Oral Given 05/20/18 2114)  tamsulosin (FLOMAX) capsule 0.4 mg (0.4 mg Oral Given 05/20/18 2114)  ondansetron (ZOFRAN) tablet 4 mg (has no administration in time range)    Or  ondansetron (ZOFRAN) injection 4 mg (has no administration in time  range)  lidocaine (XYLOCAINE) 1 % (with pres) injection (has no administration in time range)  acetaminophen (TYLENOL) tablet 650 mg (650 mg Oral Given 05/21/18 1608)  lidocaine (LIDODERM) 5 % 1 patch (1 patch Transdermal Patch Applied 05/21/18 1608)    midazolam (VERSED) 2 MG/2ML injection (has no administration in time range)  fentaNYL (SUBLIMAZE) 100 MCG/2ML injection (has no administration in time range)  lidocaine (XYLOCAINE) 1 % (with pres) injection (has no administration in time range)  metoprolol tartrate (LOPRESSOR) injection 5 mg (has no administration in time range)  sodium chloride 0.9 % bolus 250 mL (has no administration in time range)  0.9 %  sodium chloride infusion ( Intravenous New Bag/Given 05/21/18 0836)  lidocaine (XYLOCAINE) 1 % (with pres) injection (has no administration in time range)  morphine 10 MG/5ML solution 5 mg (5 mg Oral Given 05/21/18 1608)  sodium chloride 0.9 % bolus 1,000 mL (0 mLs Intravenous Stopped 05/17/18 1557)  vancomycin (VANCOCIN) IVPB 1000 mg/200 mL premix (0 mg Intravenous Stopped 05/17/18 1617)  levofloxacin (LEVAQUIN) IVPB 500 mg (0 mg Intravenous Stopped 05/17/18 1440)  sodium chloride 0.9 % bolus 250 mL (0 mLs Intravenous Stopped 05/17/18 2227)  sodium chloride 0.9 % bolus 500 mL (0 mLs Intravenous Stopped 05/18/18 0244)  0.9 %  sodium chloride infusion (Manually program via Guardrails IV Fluids) ( Intravenous Given 05/18/18 1032)  iohexol (OMNIPAQUE) 300 MG/ML solution 75 mL (75 mLs Intravenous Contrast Given 05/18/18 1239)  0.9 %  sodium chloride infusion (Manually program via Guardrails IV Fluids) ( Intravenous Given 05/19/18 1145)  acetaminophen (TYLENOL) tablet 650 mg (650 mg Oral Given 05/19/18 1151)  diphenhydrAMINE (BENADRYL) injection 12.5 mg (12.5 mg Intravenous Given 05/19/18 1150)  gi cocktail (Maalox,Lidocaine,Donnatal) (30 mLs Oral Given 05/19/18 2106)  midazolam (VERSED) injection (0.5 mg Intravenous Given 05/20/18 1154)  fentaNYL (SUBLIMAZE) injection (25 mcg Intravenous Given 05/20/18 1154)  sodium chloride 0.9 % bolus 250 mL (0 mLs Intravenous Stopped 05/21/18 0408)  sodium chloride 0.9 % bolus 500 mL (0 mLs Intravenous Stopped 05/21/18 1116)     Initial Impression / Assessment and  Plan / ED Course  I have reviewed the triage vital signs and the nursing notes.  Pertinent labs & imaging results that were available during my care of the patient were reviewed by me and considered in my medical decision making (see chart for details).     Patient with congestive heart failure and pleural effusion he will be admitted to medicine  Final Clinical Impressions(s) / ED Diagnoses   Final diagnoses:  Pleural effusion  S/P thoracentesis  Lung mass  SOB (shortness of breath)    ED Discharge Orders    None       Milton Ferguson, MD 05/21/18 1646

## 2018-05-21 NOTE — Progress Notes (Signed)
Jeannette Corpus, NP, made aware of pt's BP 78/48 manually.  Will also make primary RN aware. Carnella [place] I

## 2018-05-21 NOTE — Progress Notes (Signed)
PT Cancellation Note  Patient Details Name: Kyle Armstrong MRN: [id number] DOB: 09/25/1930   Cancelled Treatment:    Reason Eval/Treat Not Completed: Patient at procedure or test/unavailable(thoracentesis)   [facility] 05/21/2018, 1:51 PM

## 2018-05-21 NOTE — Procedures (Signed)
Ultrasound-guided  therapeutic left thoracentesis performed yielding 750 cc of bloody fluid (loculated effusion). No immediate complications. F/u CXR pending.

## 2018-05-21 NOTE — Progress Notes (Signed)
Repeat BP after bolus continues to run low. NP on call was notified and new order received. Will continue to monitor patient.

## 2018-05-22 DIAGNOSIS — R590 Localized enlarged lymph nodes: Secondary | ICD-10-CM

## 2018-05-22 DIAGNOSIS — R0600 Dyspnea, unspecified: Secondary | ICD-10-CM

## 2018-05-22 LAB — CBC
HEMATOCRIT: 35.3 % — AB (ref 39.0–52.0)
HEMOGLOBIN: 10.9 g/dL — AB (ref 13.0–17.0)
MCH: 29.5 pg (ref 26.0–34.0)
MCHC: 30.9 g/dL (ref 30.0–36.0)
MCV: 95.7 fL (ref 78.0–100.0)
Platelets: 217 10*3/uL (ref 150–400)
RBC: 3.69 MIL/uL — ABNORMAL LOW (ref 4.22–5.81)
RDW: 17.1 % — AB (ref 11.5–15.5)
WBC: 7.3 10*3/uL (ref 4.0–10.5)

## 2018-05-22 LAB — BASIC METABOLIC PANEL
Anion gap: 6 (ref 5–15)
BUN: 22 mg/dL (ref 8–23)
CALCIUM: 8.3 mg/dL — AB (ref 8.9–10.3)
CHLORIDE: 107 mmol/L (ref 98–111)
CO2: 28 mmol/L (ref 22–32)
CREATININE: 0.77 mg/dL (ref 0.61–1.24)
GFR calc Af Amer: >60 mL/min (ref >60)
GFR calc non Af Amer: >60 mL/min (ref >60)
GLUCOSE: 81 mg/dL (ref 70–99)
Potassium: 4.4 mmol/L (ref 3.5–5.1)
Sodium: 141 mmol/L (ref 135–145)

## 2018-05-22 LAB — MAGNESIUM: Magnesium: 1.9 mg/dL (ref 1.7–2.4)

## 2018-05-22 LAB — CULTURE, BLOOD (ROUTINE X 2)
Culture: NO GROWTH
Culture: NO GROWTH
SPECIAL REQUESTS: ADEQUATE
Special Requests: ADEQUATE

## 2018-05-22 LAB — CULTURE, BODY FLUID-BOTTLE

## 2018-05-22 LAB — CULTURE, BODY FLUID W GRAM STAIN -BOTTLE: Culture: NO GROWTH

## 2018-05-22 MED ORDER — SODIUM CHLORIDE 0.9 % IV BOLUS
250.0000 mL | Freq: Once | INTRAVENOUS | Status: AC
  Administered 2018-05-22: 250 mL via INTRAVENOUS

## 2018-05-22 NOTE — Progress Notes (Signed)
BP=75/55 after the bolus. Prime doc paged with a new order for another NS bolus from [facility] NP. Will continue to monitor.

## 2018-05-22 NOTE — Progress Notes (Signed)
PROGRESS NOTE    Kyle Armstrong  ELF:[id number] DOB: 1930/01/22 DOA: 05/17/2018 PCP: Lawerance Cruel, MD   Brief Narrative: Pt. with PMH of chronic diastolic CHF, HLD, BPH; admitted on 05/17/2018, presented with complaint of shortness of breath, was found to have recurrent left pleural effusion secondary to left lung mass. Currently further plan is continue close monitoring and supportive measures as well as follow-up on recommendations from oncology.     Assessment & Plan:   Principal Problem:   Acute hypoxemic respiratory failure (HCC) Active Problems:   Hyperlipidemia   Hypothyroidism   Pleural effusion   Lung mass   Goals of care, counseling/discussion   Advance care planning   Palliative care by specialist   S/P thoracentesis   Acute blood loss anemia   New onset a-fib ([place])  .  Acute on chronic hypoxic respiratory failure. Recurrent left pleural effusion-hemothorax.  Likely malignant Left lung cancer with mediastinal mass.   prior history of gastric carcinoma treated with surgery and in remission. Baseline uses 2 L of oxygen. Status post thoracentesis with 1.3 L of bloody fluid removed 05/18/2018.  Concern for malignant pleural effusion.   Cytology as well as infectious work-up from prior thoracentesis was negative although CT of the chest performed this admission shows large central left lung mass roughly 7 x 8 cm size contiguous with left hilum mass along the pulmonary artery probably involving pleura and pericardium along with paraesophageal, bilateral hilar subcarinal adenopathy.  Improved but persistent left hemithorax compatible with stage IV lung cancer. Oncology consulted and patient seen in consultation by Dr. Benay Spice. Cytology results were negative for malignancy and as such oncology consulted with IR for biopsy of pleural mass which was done 05/20/2018 and biopsy results pending.  Patient noted to have systolic blood pressures in the 80s-90s.  Hemoglobin at  9.6 from 10.0 from 7.4 after transfusion of 2 units packed red blood cells. Follow H&H. Initially thought to be fluid related to CHF and therefore patient was given IV Lasix will hold off on that right now. Even though no active bleeding noted on the CT scan, should the patient has persistent hemodynamic instability or no improvement in anemia may benefit from IR consultation for embolization. Patient with a worsening shortness of breath and chest x-ray with reaccumulation of pleural effusion on the left.  IR to evaluate for ultrasound-guided thoracentesis for symptomatic relief. Pending biopsy results, oncology to determine treatment options at that time.  Discussed with oncology as to whether patient may benefit from radiation oncology evaluation for possible radiation to shrink mass noted on chest x-ray to see if this may help with his respiratory status. Palliative care following.  2.  Acute blood loss anemia. Likely secondary to bloody pleural effusion.  Status post 1 unit packed red blood cells.  Hemoglobin at 10.9 from 9.6 from 10.0 from 7.4.  Patient with systolic blood pressures in the low 70s-80s.  Status post 2 units packed red blood cells.  Follow H&H. Transfusion threshold hemoglobin less than 7 or hemodynamic instability.  3..  New onset A. fib Patient noted to go into atrial fibrillation the evening of 05/20/2018, however rate seems to be controlled.  Likely secondary to acute respiratory issues as in #1.  Patient with systolic blood pressures in the 70s - 80s this morning however mentating well.  TSH within normal limits at 1.372.  2D echo done on 04/12/2018 with a EF of 55 to 58%, grade 1 diastolic dysfunction, mild aortic valvular stenosis, left  pleural effusion.  Patient not an anticoagulation candidate as thoracentesis done was bloody.  Patient status post transfusion of packed red blood cells.  Follow.  4.  Chronic diastolic CHF. Currently euvolemic.  Blood pressure was borderline  and as such antihypertensive medications and diuretics on hold.  Will follow.   5.  Dyslipidemia. Stable.  Continue statin.    6.  BPH. Continue Flomax.   7.  Hypothyroidism. TSH was 1.372 on 04/11/2018.  Continue Synthroid.  8.  Hypertension Patient noted to be hypotensive this morning.  Antihypertensive medications on hold.  IV fluids.  Gentle bolus.  Follow.  9.  Chronic interstitial fibrosis of bilateral lung parenchyma with honeycombing. Could partly be contributing to patient's hypoxia.  Follow for now.  10.  Hypotension Questionable etiology.  Patient with no signs or symptoms of infection.  May be secondary to A. fib versus problem #1.  Patient mentating well.  We will give a 250 cc bolus of normal saline x1.  Increase maintenance fluids to normal saline at 125 cc/h monitor closely for volume overload.     DVT prophylaxis: SCDs Code Status: DNR Family Communication: Updated patient.  No family at bedside. Disposition Plan: To be determined.  Likely skilled nursing facility.   Consultants:   Oncology: Dr. Benay Spice 05/19/2018  Palliative care: Mariana Kaufman, NP 05/19/2018  Interventional radiology  Procedures:   CT chest 05/18/2018  Chest x-ray 05/17/2018  Ultrasound guided thoracentesis 05/17/2018--1.3 L of dark bloody fluid removed.  Per Rowe Robert, PA 05/17/2018  2 units packed red blood cells pending 05/19/2018  1 unit packed red blood cells 05/18/2018  Ultrasound-guided core biopsy left posterior pleural mass pending 05/20/2018  Ultrasound-guided thoracentesis pending 05/21/2018  Antimicrobials:  IV vancomycin 624 [id number] dose IV Levaquin 624 [id number] dose   Subjective: Patient in bed states no significant change with the shortness of breath.  However felt some relief after ultrasound-guided thoracentesis on 05/21/2018.  Denies any chest pain.    Objective: Vitals:   05/22/18 0030 05/22/18 0201 05/22/18 0507 05/22/18 1101  BP: (!) 75/55 (!) 79/55 (!)  82/62 90/70  Pulse:  77 94 95  Resp:   16   Temp:   98 F (36.7 C)   TempSrc:      SpO2:   90%   Weight:      Height:        Intake/Output Summary (Last 24 hours) at 05/22/2018 1107 Last data filed at 05/22/2018 1046 Gross per 24 hour  Intake 2540.66 ml  Output 195 ml  Net 2345.66 ml   Filed Weights   05/17/18 1206 05/17/18 1741  Weight: 62.4 kg (137 lb 8 oz) 62.9 kg (138 lb 10.7 oz)    Examination:  General exam: NAD Respiratory system: Decreased breath sounds on the left.  Some right basilar crackles/coarse breath sounds.  Speaking in full sentences. Cardiovascular system: Irregularly irregular.  No lower extremity edema.  No JVD.   Gastrointestinal system: Abdomen is soft, nontender, nondistended, positive bowel sounds.  No rebound.  No guarding.   Central nervous system: Alert and oriented. No focal neurological deficits. Extremities: Symmetric 5 x 5 power. Skin: No rashes, lesions or ulcers Psychiatry: Judgement and insight appear normal. Mood & affect appropriate.     Data Reviewed: I have personally reviewed following labs and imaging studies  CBC: Recent Labs  Lab 05/17/18 1229  05/18/18 0927 05/18/18 1557 05/19/18 0846 05/19/18 2059 05/20/18 0450 05/21/18 0454 05/22/18 0445  WBC 9.0   < > 7.5  8.5 7.7  --  9.4 7.1 7.3  NEUTROABS 7.9  --  6.0  --  6.3  --   --   --   --   HGB 8.5*   < > 6.4* 7.7* 7.4* 9.8* 10.0* 9.6* 10.9*  HCT 27.6*   < > 20.4* 24.1* 23.7* 30.2* 31.5* 31.4* 35.3*  MCV 99.3   < > 97.6 93.4 94.8  --  92.9 95.7 95.7  PLT 301   < > 275 268 261  --  236 221 217   < > = values in this interval not displayed.   Basic Metabolic Panel: Recent Labs  Lab 05/18/18 0522 05/19/18 0846 05/20/18 0450 05/21/18 0454 05/22/18 0445  NA 138 139 140 140 141  K 4.5 4.1 4.3 4.2 4.4  CL 102 101 104 105 107  CO2 31 30 30 28 28   GLUCOSE 99 105* 114* 100* 81  BUN 31* 23 23 23 22   CREATININE 0.93 0.87 0.89 0.88 0.77  CALCIUM 8.1* 8.5* 8.5* 8.4* 8.3*    MG  --  1.8 1.9  --  1.9   GFR: Estimated Creatinine Clearance: 59 mL/min (by C-G formula based on SCr of 0.77 mg/dL). Liver Function Tests: Recent Labs  Lab 05/17/18 1229  AST 26  ALT 14*  ALKPHOS 100  BILITOT 0.5  PROT 6.5  ALBUMIN 2.9*   No results for input(s): LIPASE, AMYLASE in the last 168 hours. No results for input(s): AMMONIA in the last 168 hours. Coagulation Profile: Recent Labs  Lab 05/18/18 0927  INR 1.13   Cardiac Enzymes: No results for input(s): CKTOTAL, CKMB, CKMBINDEX, TROPONINI in the last 168 hours. BNP (last 3 results) No results for input(s): PROBNP in the last 8760 hours. HbA1C: No results for input(s): HGBA1C in the last 72 hours. CBG: No results for input(s): GLUCAP in the last 168 hours. Lipid Profile: No results for input(s): CHOL, HDL, LDLCALC, TRIG, CHOLHDL, LDLDIRECT in the last 72 hours. Thyroid Function Tests: No results for input(s): TSH, T4TOTAL, FREET4, T3FREE, THYROIDAB in the last 72 hours. Anemia Panel: No results for input(s): VITAMINB12, FOLATE, FERRITIN, TIBC, IRON, RETICCTPCT in the last 72 hours. Sepsis Labs: Recent Labs  Lab 05/17/18 1232  LATICACIDVEN 1.67    Recent Results (from the past 240 hour(s))  Blood Culture (routine x 2)     Status: None   Collection Time: 05/17/18 12:30 PM  Result Value Ref Range Status   Specimen Description   Final    BLOOD RIGHT ANTECUBITAL Performed at [facility] [address], [place], [place] [address]    Special Requests   Final    BOTTLES DRAWN AEROBIC AND ANAEROBIC Blood Culture adequate volume Performed at [facility] [address], [place], [place] [address]    Culture   Final    NO GROWTH 5 DAYS Performed at [facility], [place] [address], [place], [place] [address]    Report Status 05/22/2018 FINAL  Final  Blood Culture (routine x 2)     Status: None   Collection Time: 05/17/18 12:59 PM  Result Value Ref Range Status    Specimen Description   Final    BLOOD RIGHT FOREARM Performed at [facility], [place] [address] [place], [place] [address]    Special Requests   Final    BOTTLES DRAWN AEROBIC AND ANAEROBIC Blood Culture adequate volume Performed at [facility] [address] [place], [place] [address]  Culture   Final    NO GROWTH 5 DAYS Performed at [facility], [place] [address], [place], [place] [address]    Report Status 05/22/2018 FINAL  Final  Culture, body fluid-bottle     Status: None   Collection Time: 05/17/18  4:55 PM  Result Value Ref Range Status   Specimen Description FLUID PLEURAL LEFT  Final   Special Requests BOTTLES DRAWN AEROBIC AND ANAEROBIC  Final   Culture   Final    NO GROWTH 5 DAYS Performed at [facility], [place] [address], [place], [place] [address]    Report Status 05/22/2018 FINAL  Final  Gram stain     Status: None   Collection Time: 05/17/18  4:55 PM  Result Value Ref Range Status   Specimen Description FLUID PLEURAL LEFT  Final   Special Requests NONE  Final   Gram Stain   Final    MODERATE WBC PRESENT,BOTH PMN AND MONONUCLEAR NO ORGANISMS SEEN Performed at [facility], [place] [address] [place], [place] [address]    Report Status 05/17/2018 FINAL  Final  MRSA PCR Screening     Status: None   Collection Time: 05/18/18 12:53 AM  Result Value Ref Range Status   MRSA by PCR NEGATIVE NEGATIVE Final    Comment:        The GeneXpert MRSA Assay (FDA approved for NASAL specimens only), is one component of a comprehensive MRSA colonization surveillance program. It is not intended to diagnose MRSA infection nor to guide or monitor treatment for MRSA infections. Performed at [facility], [place] [address] [place],  [address]          Radiology Studies: Dg Chest 1 View  Result Date: 05/21/2018 CLINICAL DATA:  Post thoracentesis Post thoracentesis EXAM: CHEST  1 VIEW COMPARISON:   05/20/2018, 05/18/2018 CT FINDINGS: The heart size is enlarged but difficult to assess given the significant opacity in the lung. There is a moderate loculated LEFT-sided pleural effusion. Aeration in the LEFT lung is limited to the apex, similar to prior studies. There are prominent interstitial markings in the RIGHT lung which appear stable. Chronic change is identified in the LEFT shoulder. IMPRESSION: Persistent significant loculated LEFT pleural effusion and LEFT-sided opacity. Stable appearance of the RIGHT lung. Electronically Signed   By: Nolon Nations M.D.   On: 05/21/2018 13:53   Dg Chest Port 1 View  Result Date: 05/20/2018 CLINICAL DATA:  Shortness of breath, chronic CHF, severe kyphosis EXAM: PORTABLE CHEST 1 VIEW COMPARISON:  CT chest of 05/18/2018 and chest x-ray of 05/17/2017 FINDINGS: The left pleural effusion has reaccumulated after thoracentesis with a large left pleural effusion now present with considerable loss of volume of the left lung and mediastinal shift to the right. Coarse lung markings remain throughout the right lung. Heart size is difficult to assess. No bony abnormality is seen. IMPRESSION: Interval re-accumulation of large left pleural effusion with almost complete compression of the left lung and mediastinal shift to the right. Electronically Signed   By: Ivar Drape M.D.   On: 05/20/2018 16:19   [place] Core Biopsy (soft Tissue)  Result Date: 05/20/2018 INDICATION: 84 year old male with a history of gastric cancer and new onset shortness of breath. CT imaging demonstrates extensive pleural disease, a large left pleural effusion and left-sided lung masses. Findings are concerning for metastatic gastric cancer versus new primary lung cancer. He presents for ultrasound-guided biopsy of 1 of the posterior pleural based intercostal  masses. EXAM: Ultrasound-guided core biopsy, soft tissue MEDICATIONS: None. ANESTHESIA/SEDATION: Moderate (conscious) sedation was employed during  this procedure. A total of Versed 0.5 mg and Fentanyl 25 mcg was administered intravenously. Moderate Sedation Time: 10 minutes. The patient's level of consciousness and vital signs were monitored continuously by radiology nursing throughout the procedure under my direct supervision. FLUOROSCOPY TIME:  Fluoroscopy Time: 0 minutes 0 seconds (0 mGy). COMPLICATIONS: None immediate. PROCEDURE: Informed written consent was obtained from the patient after a thorough discussion of the procedural risks, benefits and alternatives. All questions were addressed. A timeout was performed prior to the initiation of the procedure. Ultrasound was used to interrogate the left posterior intercostal spaces. Ultimately, a approximately 2.6 x 1.4 cm hypoechoic soft tissue mass was successfully identified. The overlying skin was appropriately prepped and draped in the standard sterile fashion using chlorhexidine skin prep. Local anesthesia was attained by infiltration with 1% lidocaine. A small dermatotomy was made. Under real-time sonographic guidance, multiple 18 gauge core biopsies were obtained using a [employer] automated biopsy device. The biopsy specimens were placed in formalin and delivered to pathology for further analysis. Post biopsy ultrasound imaging demonstrates no evidence of immediate complication. The patient tolerated the procedure well. IMPRESSION: Successful ultrasound-guided core biopsy of left posterior pleural-based intercostal mass. Signed, Criselda Peaches, MD [facility] Electronically Signed   By: Jacqulynn Cadet M.D.   On: 05/20/2018 13:01   US Thoracentesis Asp Pleural Space W/img Guide  Result Date: 05/21/2018 INDICATION: Patient with history of CHF, gastric cancer 2014, dyspnea, recurrent loculated left pleural effusion. Request made for therapeutic left thoracentesis. EXAM: ULTRASOUND GUIDED THERAPEUTIC LEFT THORACENTESIS MEDICATIONS:  None COMPLICATIONS: None immediate. PROCEDURE: An ultrasound guided thoracentesis was thoroughly discussed with the patient and questions answered. The benefits, risks, alternatives and complications were also discussed. The patient understands and wishes to proceed with the procedure. Written consent was obtained. Ultrasound was performed to localize and mark an adequate pocket of fluid in the left chest. The area was then prepped and draped in the normal sterile fashion. 1% Lidocaine was used for local anesthesia. Under ultrasound guidance a 6 Fr Safe-T-Centesis catheter was introduced. Thoracentesis was performed. The catheter was removed and a dressing applied. FINDINGS: A total of approximately 750 cc of bloody fluid was removed. The effusion is multiloculated. IMPRESSION: Successful ultrasound guided therapeutic left thoracentesis yielding 750 cc of pleural fluid. Read by: Rowe Robert, PA-C Electronically Signed   By: Corrie Mckusick D.O.   On: 05/21/2018 13:44        Scheduled Meds: . acetaminophen  650 mg Oral Q6H WA  . ferrous sulfate  325 mg Oral Daily  . levothyroxine  50 mcg Oral Q0600  . lidocaine  1 patch Transdermal Q24H  . simvastatin  40 mg Oral QHS  . tamsulosin  0.4 mg Oral QHS   Continuous Infusions: . sodium chloride 125 mL/hr at 05/22/18 1046  . sodium chloride       LOS: 5 days    Time spent: 40 minutes    Irine Seal, MD [facility] Pager [phone]  If 7PM-7AM, please contact night-coverage [email or web address] Password [facility] 05/22/2018, 11:07 AM

## 2018-05-22 NOTE — Progress Notes (Signed)
HEMATOLOGY-ONCOLOGY PROGRESS NOTE  SUBJECTIVE: No acute events overnight.  He appears to be stable clinically.  Chronic dyspnea stable He has been having some difficulty with coughing up phlegm.  OBJECTIVE: REVIEW OF SYSTEMS:   He denied any discomfort and was comfortably lying in bed. Complete review of systems was not obtained because he was hard of hearing.  He was also sleepy.  PHYSICAL EXAMINATION: ECOG PERFORMANCE STATUS: 4 - Bedbound  Vitals:   05/22/18 0201 05/22/18 0507  BP: (!) 79/55 (!) 82/62  Pulse: 77 94  Resp:  16  Temp:  98 F (36.7 C)  SpO2:  90%   Filed Weights   05/17/18 1206 05/17/18 1741  Weight: 137 lb 8 oz (62.4 kg) 138 lb 10.7 oz (62.9 kg)    GENERAL:alert, no distress and comfortable SKIN: skin color, texture, turgor are normal, no rashes or significant lesions EYES: normal, Conjunctiva are pink and non-injected, sclera clear LUNGS: Shallow inspiration.  Crackles at the bases HEART: regular rate & rhythm  ABDOMEN:abdomen soft, non-tender and normal bowel sounds Musculoskeletal:no cyanosis of digits and no clubbing  NEURO: Extremely hard of hearing, diffuse generalized weakness  LABORATORY DATA:  I have reviewed the data as listed CMP Latest Ref Rng & Units 05/22/2018 05/21/2018 05/20/2018  Glucose 70 - 99 mg/dL 81 100(H) 114(H)  BUN 8 - 23 mg/dL 22 23 23   Creatinine 0.61 - 1.24 mg/dL 0.77 0.88 0.89  Sodium 135 - 145 mmol/L 141 140 140  Potassium 3.5 - 5.1 mmol/L 4.4 4.2 4.3  Chloride 98 - 111 mmol/L 107 105 104  CO2 22 - 32 mmol/L 28 28 30   Calcium 8.9 - 10.3 mg/dL 8.3(L) 8.4(L) 8.5(L)  Total Protein 6.5 - 8.1 g/dL - - -  Total Bilirubin 0.3 - 1.2 mg/dL - - -  Alkaline Phos 38 - 126 U/L - - -  AST 15 - 41 U/L - - -  ALT 17 - 63 U/L - - -    Lab Results  Component Value Date   WBC 7.3 05/22/2018   HGB 10.9 (L) 05/22/2018   HCT 35.3 (L) 05/22/2018   MCV 95.7 05/22/2018   PLT 217 05/22/2018   NEUTROABS 6.3 05/19/2018    ASSESSMENT AND  PLAN:  1. CT chest 05/18/2018- central left lung mass, left hilar/mediastinal adenopathy, left pleural nodularity  Dr. Learta Codding discussed extensively with pathology.  The final pathology report is not completely out.  Differential diagnosis is still between small cell lung cancer versus non-small cell lung cancer.  The plan is to pursue hospice care if the patient has non-small cell lung cancer.  If he remains stable over the weekend Dr.Sherill will wait till Monday to get the final pathology report before deciding on the final disposition.  Ultrasound-guided thoracentesis was performed yesterday some 50 cc of bloody fluid removed  I called the family but it went to voicemail.  We will follow the patient regularly to assess stability.

## 2018-05-22 NOTE — Progress Notes (Signed)
NP on call was notified via text page regarding blood pressure of 79/55 after second IVF bolus was given.  No new orders were received. Will continue to monitor patient.

## 2018-05-23 DIAGNOSIS — I4891 Unspecified atrial fibrillation: Secondary | ICD-10-CM

## 2018-05-23 LAB — BASIC METABOLIC PANEL
Anion gap: 4 — ABNORMAL LOW (ref 5–15)
BUN: 21 mg/dL (ref 8–23)
CALCIUM: 8.2 mg/dL — AB (ref 8.9–10.3)
CO2: 28 mmol/L (ref 22–32)
Chloride: 111 mmol/L (ref 98–111)
Creatinine, Ser: 0.69 mg/dL (ref 0.61–1.24)
Glucose, Bld: 99 mg/dL (ref 70–99)
Potassium: 4.4 mmol/L (ref 3.5–5.1)
SODIUM: 143 mmol/L (ref 135–145)

## 2018-05-23 LAB — CBC
HEMATOCRIT: 33.2 % — AB (ref 39.0–52.0)
Hemoglobin: 10.1 g/dL — ABNORMAL LOW (ref 13.0–17.0)
MCH: 29.8 pg (ref 26.0–34.0)
MCHC: 30.4 g/dL (ref 30.0–36.0)
MCV: 97.9 fL (ref 78.0–100.0)
PLATELETS: 235 10*3/uL (ref 150–400)
RBC: 3.39 MIL/uL — ABNORMAL LOW (ref 4.22–5.81)
RDW: 17 % — AB (ref 11.5–15.5)
WBC: 7.2 10*3/uL (ref 4.0–10.5)

## 2018-05-23 LAB — MAGNESIUM: MAGNESIUM: 2 mg/dL (ref 1.7–2.4)

## 2018-05-23 MED ORDER — TRAMADOL HCL 50 MG PO TABS: 50.0000 mg | ORAL_TABLET | Freq: Once | ORAL | Status: DC

## 2018-05-23 MED ORDER — SODIUM CHLORIDE 0.9 % IV BOLUS
250.0000 mL | Freq: Once | INTRAVENOUS | Status: AC
  Administered 2018-05-23: 250 mL via INTRAVENOUS

## 2018-05-23 NOTE — Progress Notes (Signed)
PROGRESS NOTE    Kyle Armstrong  IEP:[id number] DOB: [birth year] DOA: 05/17/2018 PCP: Lawerance Cruel, MD   Brief Narrative: Pt. with PMH of chronic diastolic CHF, HLD, BPH; admitted on 05/17/2018, presented with complaint of shortness of breath, was found to have recurrent left pleural effusion secondary to left lung mass. Currently further plan is continue close monitoring and supportive measures as well as follow-up on recommendations from oncology.     Assessment & Plan:   Principal Problem:   Acute hypoxemic respiratory failure (HCC) Active Problems:   Hyperlipidemia   Hypothyroidism   Pleural effusion   Lung mass   Goals of care, counseling/discussion   Advance care planning   Palliative care by specialist   S/P thoracentesis   Acute blood loss anemia   New onset a-fib ([place])  .  Acute on chronic hypoxic respiratory failure. Recurrent left pleural effusion-hemothorax.  Likely malignant Left lung cancer with mediastinal mass.   prior history of gastric carcinoma treated with surgery and in remission. Baseline uses 2 L of oxygen. Status post thoracentesis with 1.3 L of bloody fluid removed 05/18/2018.  Concern for malignant pleural effusion.   Cytology as well as infectious work-up from prior thoracentesis was negative although CT of the chest performed this admission shows large central left lung mass roughly 7 x 8 cm size contiguous with left hilum mass along the pulmonary artery probably involving pleura and pericardium along with paraesophageal, bilateral hilar subcarinal adenopathy.  Improved but persistent left hemithorax compatible with stage IV lung cancer. Oncology consulted and patient seen in consultation by Dr. Benay Spice. Cytology results were negative for malignancy and as such oncology consulted with IR for biopsy of pleural mass which was done 05/20/2018 and biopsy results pending.  Patient noted to have systolic blood pressures in the 80s-90s on 05/22/2018.   Systolic blood pressures now in the low 90s..  Hemoglobin at 10.1 from 10.9 from 9.6 from 10.0 from 7.4 after transfusion of 2 units packed red blood cells. Follow H&H. Initially thought to be fluid related to CHF and therefore patient was given IV Lasix will hold off on that right now. Even though no active bleeding noted on the CT scan, should the patient has persistent hemodynamic instability or no improvement in anemia may benefit from IR consultation for embolization. Patient with a worsening shortness of breath and chest x-ray with reaccumulation of pleural effusion on the left.  IR to evaluate for ultrasound-guided thoracentesis for symptomatic relief. Pending biopsy results, oncology to determine treatment options at that time.  Discussed with oncology as to whether patient may benefit from radiation oncology evaluation for possible radiation to shrink mass noted on chest x-ray to see if this may help with his respiratory status. Palliative care following.  2.  Acute blood loss anemia. Likely secondary to bloody pleural effusion.  Status post 1 unit packed red blood cells.  Hemoglobin at 10.1 from 10.9 from 9.6 from 10.0 from 7.4.  Patient with systolic blood pressures in the low 90s.  Status post 2 units packed red blood cells.  Follow H&H. Transfusion threshold hemoglobin less than 7 or hemodynamic instability.  3..  New onset A. fib Patient noted to go into atrial fibrillation the evening of 05/20/2018, however rate seems to be controlled.  Likely secondary to acute respiratory issues as in #1.  Patient with systolic blood pressures in the 80s-90s this morning however mentating well.  TSH within normal limits at 1.372.  2D echo done on 04/12/2018 with  a EF of 55 to 89%, grade 1 diastolic dysfunction, mild aortic valvular stenosis, left pleural effusion.  Patient not an anticoagulation candidate as thoracentesis done was bloody.  Patient status post transfusion of packed red blood cells.   Follow.  4.  Chronic diastolic CHF. Currently euvolemic.  Blood pressure was borderline and as such antihypertensive medications and diuretics on hold.  Will follow.   5.  Dyslipidemia. Statin.    6.  BPH. Continue Flomax.   7.  Hypothyroidism. TSH was 1.372 on 04/11/2018.  Continue Synthroid.  8.  Hypertension Patient noted to be hypotensive/borderline blood pressure.  Antihypertensive medications on hold.  Decrease IV fluids to 75 cc/h.    9.  Chronic interstitial fibrosis of bilateral lung parenchyma with honeycombing. Could partly be contributing to patient's hypoxia.  Follow for now.  10.  Hypotension Questionable etiology.  Patient with no signs or symptoms of infection.  May be secondary to A. fib versus problem #1.  Patient mentating well.  Blood pressure with some improvement.  Decrease maintenance IV fluids to 75 cc/h.  Follow.      DVT prophylaxis: SCDs Code Status: DNR Family Communication: Updated patient.  No family at bedside. Disposition Plan: Likely skilled nursing facility.   Consultants:   Oncology: Dr. Benay Spice 05/19/2018  Palliative care: Mariana Kaufman, NP 05/19/2018  Interventional radiology  Procedures:   CT chest 05/18/2018  Chest x-ray 05/17/2018  Ultrasound guided thoracentesis 05/17/2018--1.3 L of dark bloody fluid removed.  Per Rowe Robert, PA 05/17/2018  2 units packed red blood cells 05/19/2018  1 unit packed red blood cells 05/18/2018  Ultrasound-guided core biopsy left posterior pleural mass 05/20/2018  Ultrasound-guided thoracentesis 05/21/2018  Antimicrobials:  IV vancomycin 624 [id number] dose IV Levaquin 624 [id number] dose   Subjective: Patient states no significant change with shortness of breath since admission.  Did have some relief after ultrasound-guided thoracentesis on 05/21/2018.  Denies any chest pain.    Objective: Vitals:   05/23/18 0050 05/23/18 0543 05/23/18 0838 05/23/18 1152  BP: (!) 83/67 (!) 88/58 96/68 95/64    Pulse: 85 71 94 62  Resp: 20 20 18    Temp: 97.7 F (36.5 C) 98 F (36.7 C) (!) 97.5 F (36.4 C)   TempSrc:  Oral Oral   SpO2: 95% 100% (!) 86%   Weight:      Height:        Intake/Output Summary (Last 24 hours) at 05/23/2018 1214 Last data filed at 05/23/2018 0600 Gross per 24 hour  Intake 2884.17 ml  Output 750 ml  Net 2134.17 ml   Filed Weights   05/17/18 1206 05/17/18 1741  Weight: 62.4 kg (137 lb 8 oz) 62.9 kg (138 lb 10.7 oz)    Examination:  General exam: Frail.  Cachectic. Respiratory system: Decreased breath sounds on the left.  Right basilar crackles/coarse breath sounds.  Cardiovascular system: Irregularly irregular.  No lower extremity edema.  No JVD.   Gastrointestinal system: Abdomen is nontender, nondistended, soft, positive bowel sounds.  No rebound.  No guarding.    Central nervous system: Alert and oriented. No focal neurological deficits. Extremities: Symmetric 5 x 5 power. Skin: No rashes, lesions or ulcers Psychiatry: Judgement and insight appear normal. Mood & affect appropriate.     Data Reviewed: I have personally reviewed following labs and imaging studies  CBC: Recent Labs  Lab 05/17/18 1229  05/18/18 0927  05/19/18 0846 05/19/18 2059 05/20/18 0450 05/21/18 0454 05/22/18 0445 05/23/18 0451  WBC 9.0   < >  7.5   < > 7.7  --  9.4 7.1 7.3 7.2  NEUTROABS 7.9  --  6.0  --  6.3  --   --   --   --   --   HGB 8.5*   < > 6.4*   < > 7.4* 9.8* 10.0* 9.6* 10.9* 10.1*  HCT 27.6*   < > 20.4*   < > 23.7* 30.2* 31.5* 31.4* 35.3* 33.2*  MCV 99.3   < > 97.6   < > 94.8  --  92.9 95.7 95.7 97.9  PLT 301   < > 275   < > 261  --  236 221 217 235   < > = values in this interval not displayed.   Basic Metabolic Panel: Recent Labs  Lab 05/19/18 0846 05/20/18 0450 05/21/18 0454 05/22/18 0445 05/23/18 0451  NA 139 140 140 141 143  K 4.1 4.3 4.2 4.4 4.4  CL 101 104 105 107 111  CO2 30 30 28 28 28   GLUCOSE 105* 114* 100* 81 99  BUN 23 23 23 22 21    CREATININE 0.87 0.89 0.88 0.77 0.69  CALCIUM 8.5* 8.5* 8.4* 8.3* 8.2*  MG 1.8 1.9  --  1.9 2.0   GFR: Estimated Creatinine Clearance: 59 mL/min (by C-G formula based on SCr of 0.69 mg/dL). Liver Function Tests: Recent Labs  Lab 05/17/18 1229  AST 26  ALT 14*  ALKPHOS 100  BILITOT 0.5  PROT 6.5  ALBUMIN 2.9*   No results for input(s): LIPASE, AMYLASE in the last 168 hours. No results for input(s): AMMONIA in the last 168 hours. Coagulation Profile: Recent Labs  Lab 05/18/18 0927  INR 1.13   Cardiac Enzymes: No results for input(s): CKTOTAL, CKMB, CKMBINDEX, TROPONINI in the last 168 hours. BNP (last 3 results) No results for input(s): PROBNP in the last 8760 hours. HbA1C: No results for input(s): HGBA1C in the last 72 hours. CBG: No results for input(s): GLUCAP in the last 168 hours. Lipid Profile: No results for input(s): CHOL, HDL, LDLCALC, TRIG, CHOLHDL, LDLDIRECT in the last 72 hours. Thyroid Function Tests: No results for input(s): TSH, T4TOTAL, FREET4, T3FREE, THYROIDAB in the last 72 hours. Anemia Panel: No results for input(s): VITAMINB12, FOLATE, FERRITIN, TIBC, IRON, RETICCTPCT in the last 72 hours. Sepsis Labs: Recent Labs  Lab 05/17/18 1232  LATICACIDVEN 1.67    Recent Results (from the past 240 hour(s))  Blood Culture (routine x 2)     Status: None   Collection Time: 05/17/18 12:30 PM  Result Value Ref Range Status   Specimen Description   Final    BLOOD RIGHT ANTECUBITAL Performed at [facility] [address], [place], [place] [address]    Special Requests   Final    BOTTLES DRAWN AEROBIC AND ANAEROBIC Blood Culture adequate volume Performed at [facility] [address], [place], [place] [address]    Culture   Final    NO GROWTH 5 DAYS Performed at [facility], [place] [address], [place], [place] [address]    Report Status 05/22/2018 FINAL  Final  Blood Culture (routine x 2)     Status: None    Collection Time: 05/17/18 12:59 PM  Result Value Ref Range Status   Specimen Description   Final    BLOOD RIGHT FOREARM Performed at [facility], [place] [address], [place], [place] [address]    Special Requests   Final    BOTTLES DRAWN AEROBIC  AND ANAEROBIC Blood Culture adequate volume Performed at [facility] [address], [place], [place] [address]    Culture   Final    NO GROWTH 5 DAYS Performed at [facility], [place] [address], [place], [place] [address]    Report Status 05/22/2018 FINAL  Final  Culture, body fluid-bottle     Status: None   Collection Time: 05/17/18  4:55 PM  Result Value Ref Range Status   Specimen Description FLUID PLEURAL LEFT  Final   Special Requests BOTTLES DRAWN AEROBIC AND ANAEROBIC  Final   Culture   Final    NO GROWTH 5 DAYS Performed at [facility], [place] [address] [place], [place] [address]    Report Status 05/22/2018 FINAL  Final  Gram stain     Status: None   Collection Time: 05/17/18  4:55 PM  Result Value Ref Range Status   Specimen Description FLUID PLEURAL LEFT  Final   Special Requests NONE  Final   Gram Stain   Final    MODERATE WBC PRESENT,BOTH PMN AND MONONUCLEAR NO ORGANISMS SEEN Performed at [facility], [place] [address] [place], [place] [address]    Report Status 05/17/2018 FINAL  Final  MRSA PCR Screening     Status: None   Collection Time: 05/18/18 12:53 AM  Result Value Ref Range Status   MRSA by PCR NEGATIVE NEGATIVE Final    Comment:        The GeneXpert MRSA Assay (FDA approved for NASAL specimens only), is one component of a comprehensive MRSA colonization surveillance program. It is not intended to diagnose MRSA infection nor to guide or monitor treatment for MRSA infections. Performed at [facility], [place] [address], [place],  [address]          Radiology Studies: Dg Chest 1 View  Result Date: 05/21/2018 CLINICAL DATA:  Post  thoracentesis Post thoracentesis EXAM: CHEST  1 VIEW COMPARISON:  05/20/2018, 05/18/2018 CT FINDINGS: The heart size is enlarged but difficult to assess given the significant opacity in the lung. There is a moderate loculated LEFT-sided pleural effusion. Aeration in the LEFT lung is limited to the apex, similar to prior studies. There are prominent interstitial markings in the RIGHT lung which appear stable. Chronic change is identified in the LEFT shoulder. IMPRESSION: Persistent significant loculated LEFT pleural effusion and LEFT-sided opacity. Stable appearance of the RIGHT lung. Electronically Signed   By: Nolon Nations M.D.   On: 05/21/2018 13:53   US Thoracentesis Asp Pleural Space W/img Guide  Result Date: 05/21/2018 INDICATION: Patient with history of CHF, gastric cancer 2014, dyspnea, recurrent loculated left pleural effusion. Request made for therapeutic left thoracentesis. EXAM: ULTRASOUND GUIDED THERAPEUTIC LEFT THORACENTESIS MEDICATIONS: None COMPLICATIONS: None immediate. PROCEDURE: An ultrasound guided thoracentesis was thoroughly discussed with the patient and questions answered. The benefits, risks, alternatives and complications were also discussed. The patient understands and wishes to proceed with the procedure. Written consent was obtained. Ultrasound was performed to localize and mark an adequate pocket of fluid in the left chest. The area was then prepped and draped in the normal sterile fashion. 1% Lidocaine was used for local anesthesia. Under ultrasound guidance a 6 Fr Safe-T-Centesis catheter was introduced. Thoracentesis was performed. The catheter was removed and a dressing applied. FINDINGS: A total of approximately 750 cc of bloody fluid was removed. The effusion is multiloculated. IMPRESSION: Successful ultrasound guided therapeutic left thoracentesis yielding 750 cc of pleural fluid. Read  by: Rowe Robert, PA-C Electronically Signed   By: Corrie Mckusick D.O.   On: 05/21/2018  13:44        Scheduled Meds: . acetaminophen  650 mg Oral Q6H WA  . ferrous sulfate  325 mg Oral Daily  . levothyroxine  50 mcg Oral Q0600  . lidocaine  1 patch Transdermal Q24H  . simvastatin  40 mg Oral QHS  . tamsulosin  0.4 mg Oral QHS   Continuous Infusions: . sodium chloride 125 mL/hr at 05/22/18 1046  . sodium chloride       LOS: 6 days    Time spent: 35 minutes    Irine Seal, MD [facility] Pager [phone]  If 7PM-7AM, please contact night-coverage [email or web address] Password [email or web address] 05/23/2018, 12:14 PM

## 2018-05-23 NOTE — NC FL2 (Signed)
[place] LEVEL OF CARE SCREENING TOOL     IDENTIFICATION  Patient Name: Kyle Armstrong Birthdate: [birth year] Sex: male Admission Date (Current Location): 05/17/2018  [facility] and [place] Number:  Herbalist and Address:  [facility],  [place], [place]      Provider Number: [id number]  Attending Physician Name and Address:  Eugenie Filler, MD  Relative Name and Phone Number:       Current Level of Care: Hospital Recommended Level of Care: [place] Prior Approval Number:    Date Approved/Denied:   PASRR Number: [id number]  Discharge Plan: SNF    Current Diagnoses: Patient Active Problem List   Diagnosis Date Noted  . New onset a-fib ([place]) 05/21/2018  . Lung mass   . Goals of care, counseling/discussion   . Advance care planning   . Palliative care by specialist   . S/P thoracentesis   . Acute blood loss anemia   . Acute hypoxemic respiratory failure ([place]) 05/17/2018  . Pleural effusion 05/17/2018  . Acute congestive heart failure ([place]) 04/11/2018  . Acute respiratory failure with hypoxia ([place]) 06/21/2017  . Sarcoidosis of lung ([place]) 06/21/2017  . Hyponatremia 06/21/2017  . Congestive heart failure ([place])   . Malfunctioning jejunostomy tube ([place]) 05/15/2014  . Acute esophagitis 02/28/2014  . Diarrhea 02/28/2014  . HOH (hard of hearing) 02/18/2014  . Severe protein-calorie malnutrition ([place]) 02/18/2014  . Jejunostomy tube present ([place]) 02/17/2014  . 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 Hyperlipidemia   . Pernicious anemia   . Hypothyroidism   . Arthritis   . SVT (supraventricular tachycardia) ([place]) 06/23/2012    Orientation RESPIRATION BLADDER Height & Weight     Self, Place, Situation  O2(2L [place]) Continent Weight: 138 lb 10.7 oz (62.9 kg) Height:  5\' 7"  (170.2 cm)  BEHAVIORAL SYMPTOMS/MOOD NEUROLOGICAL BOWEL NUTRITION  STATUS      Continent Diet(regular)  AMBULATORY STATUS COMMUNICATION OF NEEDS Skin   Extensive Assist   Normal                       Personal Care Assistance Level of Assistance  Bathing, Dressing Bathing Assistance: Limited assistance   Dressing Assistance: Limited assistance     Functional Limitations Info             SPECIAL CARE FACTORS FREQUENCY  PT (By licensed PT), OT (By licensed OT)     PT Frequency: 5/wk OT Frequency: 5/wk            Contractures      Additional Factors Info  Code Status, Allergies Code Status Info: DNR Allergies Info: Lipitor Atorvastatin, Amoxicillin, Augmentin Amoxicillin-pot Clavulanate           Current Medications (05/23/2018):  This is the current hospital active medication list Current Facility-Administered Medications  Medication Dose Route Frequency Provider Last Rate Last Dose  . 0.9 %  sodium chloride infusion   Intravenous Continuous Eugenie Filler, MD 75 mL/hr at 05/23/18 1159    . acetaminophen (TYLENOL) tablet 650 mg  650 mg Oral Q6H WA Earlie Counts, NP   650 mg at 05/23/18 1442  . ferrous sulfate tablet 325 mg  325 mg Oral Daily Dessa Phi, DO   325 mg at 05/23/18 1126  . levothyroxine (SYNTHROID, LEVOTHROID) tablet 50 mcg  50 mcg Oral Q0600 Dessa Phi, DO   50 mcg at 05/23/18 0973  .  lidocaine (LIDODERM) 5 % 1 patch  1 patch Transdermal Q24H Earlie Counts, NP   1 patch at 05/23/18 1442  . metoprolol tartrate (LOPRESSOR) injection 5 mg  5 mg Intravenous Q6H PRN Eugenie Filler, MD      . morphine 10 MG/5ML solution 5 mg  5 mg Oral Q2H PRN Earlie Counts, NP   5 mg at 05/21/18 1608  . ondansetron (ZOFRAN) tablet 4 mg  4 mg Oral Q6H PRN Dessa Phi, DO       Or  . ondansetron [facility]) injection 4 mg  4 mg Intravenous Q6H PRN Dessa Phi, DO      . simvastatin (ZOCOR) tablet 40 mg  40 mg Oral QHS Dessa Phi, DO   40 mg at 05/22/18 2137  . sodium chloride 0.9 % bolus 250 mL  250 mL  Intravenous Once Schorr, Rhetta Mura, NP      . tamsulosin (FLOMAX) capsule 0.4 mg  0.4 mg Oral QHS Dessa Phi, DO   0.4 mg at 05/22/18 2136     Discharge Medications: Please see discharge summary for a list of discharge medications.  Relevant Imaging Results:  Relevant Lab Results:   Additional Information SS#: [id number]  Jorge Ny, LCSW

## 2018-05-23 NOTE — Progress Notes (Addendum)
SBP remians low at 83/67, HR 72.  IVF continue to run at 125cc/hr.  CTM reports pt had a run of Bijeminy from 2334 ending 2335 then returns to SR, first degree HB 60's.   CTM reports that this is not first time for Bijieminy had a short run on day shift 1047 AM and another 1435.  Plus pt had a run of trieminy last night at [facility].  Pt without complaints.  Will continue to monitor closely.   Dr. Kennon Holter on call and notified.  Awaiting response.

## 2018-05-24 ENCOUNTER — Encounter (HOSPITAL_COMMUNITY): Payer: Self-pay | Admitting: Physician Assistant

## 2018-05-24 DIAGNOSIS — C3490 Malignant neoplasm of unspecified part of unspecified bronchus or lung: Secondary | ICD-10-CM

## 2018-05-24 LAB — BASIC METABOLIC PANEL
ANION GAP: 7 (ref 5–15)
BUN: 19 mg/dL (ref 8–23)
CO2: 25 mmol/L (ref 22–32)
CREATININE: 0.77 mg/dL (ref 0.61–1.24)
Calcium: 8.2 mg/dL — ABNORMAL LOW (ref 8.9–10.3)
Chloride: 109 mmol/L (ref 98–111)
GFR calc Af Amer: >60 mL/min (ref >60)
Glucose, Bld: 103 mg/dL — ABNORMAL HIGH (ref 70–99)
POTASSIUM: 4.1 mmol/L (ref 3.5–5.1)
Sodium: 141 mmol/L (ref 135–145)

## 2018-05-24 LAB — CBC
HEMATOCRIT: 33 % — AB (ref 39.0–52.0)
Hemoglobin: 10 g/dL — ABNORMAL LOW (ref 13.0–17.0)
MCH: 30 pg (ref 26.0–34.0)
MCHC: 30.3 g/dL (ref 30.0–36.0)
MCV: 99.1 fL (ref 78.0–100.0)
PLATELETS: 262 10*3/uL (ref 150–400)
RBC: 3.33 MIL/uL — ABNORMAL LOW (ref 4.22–5.81)
RDW: 16.9 % — AB (ref 11.5–15.5)
WBC: 8.1 10*3/uL (ref 4.0–10.5)

## 2018-05-24 LAB — TSH: TSH: 2.896 u[IU]/mL (ref 0.350–4.500)

## 2018-05-24 MED ORDER — MORPHINE SULFATE (CONCENTRATE) 10 MG/0.5ML PO SOLN
5.0000 mg | ORAL | Status: DC | PRN
  Administered 2018-05-25: 5 mg via ORAL
  Filled 2018-05-24: qty 0.5

## 2018-05-24 NOTE — Progress Notes (Signed)
IP PROGRESS NOTE  Subjective:   Kyle Armstrong has no new complaint.  His family is not present this afternoon. Objective: Vital signs in last 24 hours: Blood pressure 94/64, pulse 98, temperature (!) 97.5 F (36.4 C), temperature source Oral, resp. rate 16, height _0  (1.702 m), weight 150 lb 9.2 oz (68.3 kg), SpO2 97 %.  Intake/Output from previous day: 06/30 0701 - 07/01 0700 In: 2459.2 [P.O.:360; I.V.:2099.2] Out: 600 [Urine:600]  Physical Exam:  HEENT:no thrush Lungs: Decreased breath sounds throughout the left chest Cardiac: Irregular Extremities: No leg edema    Lab Results: Recent Labs    05/23/18 0451 05/24/18 0514  WBC 7.2 8.1  HGB 10.1* 10.0*  HCT 33.2* 33.0*  PLT 235 262    BMET Recent Labs    05/23/18 0451 05/24/18 0514  NA 143 141  K 4.4 4.1  CL 111 109  CO2 28 25  GLUCOSE 99 103*  BUN 21 19  CREATININE 0.69 0.77  CALCIUM 8.2* 8.2*    No results found for: CEA1  Studies/Results: No results found.  Medications: I have reviewed the patient's current medications.  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   Initiation radiation and concurrent Xeloda 11/10/2013, completed 12/21/2013   Restaging CTs of the chest, abdomen, and pelvis on 01/25/2014 with persistent antral thickening, no evidence of metastatic disease.   Distal gastrectomy with Billroth II anastomosis 02/15/2014 (y pT1b, y pN0).  CT scans 03/14/2016 for evaluation of mid abdominal and back pain-no acute findings within the abdomen or pelvis. Postoperative changes identified compatible distal gastrectomy with gastrojejunostomy.  2. Diffuse "gastritis "change noted on endoscopies 08/19/2013 and 10/06/2013 with biopsies confirming atrophic gastritis. Low and high-grade dysplasia were noted on the biopsy 08/19/2013  3.  Anemia-likely secondary to chronic disease,  malnutrition, and potentially metastatic carcinoma involving the bone marrow 4. History of anorexia/weight loss.  5. History of colon polyp.  6. History of "sarcoidosis", pulmonary fibrosis noted on the chest CT 10/27/2013.  7. History of tobacco use.  8. History of arrhythmia, status post an ablation August 2013.  9.ChronicBack pain-most likely secondary to a benign musculoskeletal condition 10.  Admission 05/17/2018 with respiratory failure  Left pleural effusion on chest x-ray 04/10/2018, left thoracentesis 04/13/2018-bloody fluid, negative cytology  Large left effusion on admission chest x-ray, cytology negative  CT chest 05/18/2018- central left lung mass, left hilar/mediastinal adenopathy, left pleural nodularity  Ultrasound-guided biopsy of left posterior pleural-based mass 05/20/2018-small cell carcinoma   Kyle Armstrong has been diagnosed with small cell lung cancer.  I discussed the diagnosis with Kyle Armstrong.  His son is not present this afternoon.  I saw Kyle Armstrong at approximately 730 this morning.  He is less alert this afternoon.  He indicates he would like to proceed with a trial of palliative chemotherapy.  He understands no therapy will be curative.  I discussed the case with Dr. Grand[place] Silos.  Kyle Armstrong will be transferred to the inpatient oncology unit in anticipation of beginning etoposide/carboplatin chemotherapy on 05/25/2018. Recommendations: 1.  Consider repeat therapeutic left thoracentesis for increased dyspnea or hypoxia 2.  Transfer to 6 floor oncology unit with plan to begin chemotherapy 05/25/2018 3.  Staging brain CT over the next few days     LOS: 7 days   Betsy Coder, MD   05/24/2018, 3:24 PM

## 2018-05-24 NOTE — Progress Notes (Signed)
PROGRESS NOTE    Kyle Armstrong  KNL:[id number] DOB: 06/21/1930 DOA: 05/17/2018 PCP: Lawerance Cruel, MD   Brief Narrative: Pt. with PMH of chronic diastolic CHF, HLD, BPH; admitted on 05/17/2018, presented with complaint of shortness of breath, was found to have recurrent left pleural effusion secondary to left lung mass. Currently further plan is continue close monitoring and supportive measures as well as follow-up on recommendations from oncology.     Assessment & Plan:   Principal Problem:   Acute hypoxemic respiratory failure (HCC) Active Problems:   Hyperlipidemia   Hypothyroidism   Pleural effusion   Lung mass   Goals of care, counseling/discussion   Advance care planning   Palliative care by specialist   S/P thoracentesis   Acute blood loss anemia   New onset a-fib ([place])  .  Acute on chronic hypoxic respiratory failure. Recurrent left pleural effusion-hemothorax.  Likely malignant Left lung cancer with mediastinal mass.   prior history of gastric carcinoma treated with surgery and in remission. Baseline uses 2 L of oxygen. Status post thoracentesis with 1.3 L of bloody fluid removed 05/18/2018.  Concern for malignant pleural effusion.   Cytology as well as infectious work-up from prior thoracentesis was negative although CT of the chest performed this admission shows large central left lung mass roughly 7 x 8 cm size contiguous with left hilum mass along the pulmonary artery probably involving pleura and pericardium along with paraesophageal, bilateral hilar subcarinal adenopathy.  Improved but persistent left hemithorax compatible with stage IV lung cancer. Oncology consulted and patient seen in consultation by Dr. Benay Spice. Cytology results were negative for malignancy and as such oncology consulted with IR for biopsy of pleural mass which was done 05/20/2018 and biopsy results pending.  Patient noted to have systolic blood pressures in the 80s-90s on 05/22/2018.   Systolic blood pressures now in the low 90s..  Hemoglobin at 10.0 from 10.1 from 10.9 from 9.6 from 10.0 from 7.4 after transfusion of 2 units packed red blood cells. Follow H&H. Initially thought to be fluid related to CHF and therefore patient was given IV Lasix will hold off on that right now. Even though no active bleeding noted on the CT scan, should the patient has persistent hemodynamic instability or no improvement in anemia may benefit from IR consultation for embolization. Patient with a worsening shortness of breath and chest x-ray with reaccumulation of pleural effusion on the left.  IR to evaluate for ultrasound-guided thoracentesis for symptomatic relief. Pending biopsy results, oncology to determine treatment options at that time.  Discussed with oncology as to whether patient may benefit from radiation oncology evaluation for possible radiation to shrink mass noted on chest x-ray to see if this may help with his respiratory status. Palliative care following. Per oncology preliminary results consistent with small cell lung cancer.  Oncology planning on starting chemotherapy today and as such we will transfer to oncology floor.  2.  Acute blood loss anemia. Likely secondary to bloody pleural effusion.  Status post 1 unit packed red blood cells.  Hemoglobin at 10.0 from 10.1 from 10.9 from 9.6 from 10.0 from 7.4.  Patient with systolic blood pressures in the low 90s.  Status post 2 units packed red blood cells.  Follow H&H. Transfusion threshold hemoglobin less than 7 or hemodynamic instability.  3..  New onset A. fib Patient noted to go into atrial fibrillation the evening of 05/20/2018, heart rate ranging from the 70s to the 110s.  Likely secondary to acute  respiratory issues as in #1.  Patient with systolic blood pressures in the 80s-90s this morning however mentating well.  TSH within normal limits at 1.372.  2D echo done on 04/12/2018 with a EF of 55 to 97%, grade 1 diastolic  dysfunction, mild aortic valvular stenosis, left pleural effusion.  Patient not an anticoagulation candidate as thoracentesis done was bloody.  Patient status post transfusion of packed red blood cells.  Repeat 2D echo.  Check a TSH.  Due to borderline blood pressure and new onset A. fib consult with cardiology for further evaluation and management.  Follow.  4.  Chronic diastolic CHF. Patient with no significant O2 requirements.  Patient however with decreased breath sounds in the bases and some right basilar crackles.  Blood pressure borderline.  Diuretics and antihypertensive medications on hold.  Cardiology consult.  5.  Dyslipidemia. Statin.    6.  BPH. Continue Flomax.   7.  Hypothyroidism. TSH was 1.372 on 04/11/2018.  Continue Synthroid.  8.  Hypertension Patient noted to be hypotensive/borderline blood pressure.  Antihypertensive medications on hold.  Gentle hydration.     9.  Chronic interstitial fibrosis of bilateral lung parenchyma with honeycombing. Could partly be contributing to patient's hypoxia.  Follow for now.  10.  Hypotension Questionable etiology.  Patient with no signs or symptoms of infection.  May be secondary to A. fib versus problem #1.  Patient mentating well.  Blood pressure with some improvement.  Continue gentle hydration.  Follow.       DVT prophylaxis: SCDs Code Status: DNR Family Communication: Updated patient.  No family at bedside. Disposition Plan: Transfer to oncology floor.  Likely skilled nursing facility.   Consultants:   Oncology: Dr. Benay Spice 05/19/2018  Palliative care: Mariana Kaufman, NP 05/19/2018  Interventional radiology  Cardiology pending  Procedures:   CT chest 05/18/2018  Chest x-ray 05/17/2018  Ultrasound guided thoracentesis 05/17/2018--1.3 L of dark bloody fluid removed.  Per Rowe Robert, PA 05/17/2018  2 units packed red blood cells 05/19/2018  1 unit packed red blood cells 05/18/2018  Ultrasound-guided core  biopsy left posterior pleural mass 05/20/2018  Ultrasound-guided thoracentesis 05/21/2018  Antimicrobials:  IV vancomycin 624 [id number] dose IV Levaquin 624 [id number] dose   Subjective: Patient sitting on bedside commode.  States no significant change with shortness of breath since admission.  Denies chest pain.     Objective: Vitals:   05/24/18 0213 05/24/18 0500 05/24/18 0613 05/24/18 0811  BP: (!) 87/64  (!) 85/55 94/64  Pulse: 95  92 98  Resp: 16  16 16   Temp: 97.9 F (36.6 C)  (!) 97.5 F (36.4 C) (!) 97.5 F (36.4 C)  TempSrc:   Oral Oral  SpO2: 94%  97%   Weight:  68.3 kg (150 lb 9.2 oz) 68.3 kg (150 lb 9.2 oz)   Height:        Intake/Output Summary (Last 24 hours) at 05/24/2018 0929 Last data filed at 05/24/2018 0600 Gross per 24 hour  Intake 2459.17 ml  Output 600 ml  Net 1859.17 ml   Filed Weights   05/17/18 1741 05/24/18 0500 05/24/18 0613  Weight: 62.9 kg (138 lb 10.7 oz) 68.3 kg (150 lb 9.2 oz) 68.3 kg (150 lb 9.2 oz)    Examination:  General exam: Frail.  Cachectic. Respiratory system: Decreased breath sounds on the left.  Right basilar crackles/coarse breath sounds.  Cardiovascular system: Irregularly irregular.  No lower extremity edema.  No JVD.   Gastrointestinal system: Abdomen is soft, nontender, nondistended,  positive bowel sounds.  No rebound.  No guarding.   Central nervous system: Alert and oriented. No focal neurological deficits. Extremities: Symmetric 5 x 5 power. Skin: No rashes, lesions or ulcers Psychiatry: Judgement and insight appear normal. Mood & affect appropriate.     Data Reviewed: I have personally reviewed following labs and imaging studies  CBC: Recent Labs  Lab 05/17/18 1229  05/18/18 0927  05/19/18 0846  05/20/18 0450 05/21/18 0454 05/22/18 0445 05/23/18 0451 05/24/18 0514  WBC 9.0   < > 7.5   < > 7.7  --  9.4 7.1 7.3 7.2 8.1  NEUTROABS 7.9  --  6.0  --  6.3  --   --   --   --   --   --   HGB 8.5*   < > 6.4*   < > 7.4*    < > 10.0* 9.6* 10.9* 10.1* 10.0*  HCT 27.6*   < > 20.4*   < > 23.7*   < > 31.5* 31.4* 35.3* 33.2* 33.0*  MCV 99.3   < > 97.6   < > 94.8  --  92.9 95.7 95.7 97.9 99.1  PLT 301   < > 275   < > 261  --  236 221 217 235 262   < > = values in this interval not displayed.   Basic Metabolic Panel: Recent Labs  Lab 05/19/18 0846 05/20/18 0450 05/21/18 0454 05/22/18 0445 05/23/18 0451 05/24/18 0514  NA 139 140 140 141 143 141  K 4.1 4.3 4.2 4.4 4.4 4.1  CL 101 104 105 107 111 109  CO2 30 30 28 28 28 25   GLUCOSE 105* 114* 100* 81 99 103*  BUN 23 23 23 22 21 19   CREATININE 0.87 0.89 0.88 0.77 0.69 0.77  CALCIUM 8.5* 8.5* 8.4* 8.3* 8.2* 8.2*  MG 1.8 1.9  --  1.9 2.0  --    GFR: Estimated Creatinine Clearance: 62 mL/min (by C-G formula based on SCr of 0.77 mg/dL). Liver Function Tests: Recent Labs  Lab 05/17/18 1229  AST 26  ALT 14*  ALKPHOS 100  BILITOT 0.5  PROT 6.5  ALBUMIN 2.9*   No results for input(s): LIPASE, AMYLASE in the last 168 hours. No results for input(s): AMMONIA in the last 168 hours. Coagulation Profile: Recent Labs  Lab 05/18/18 0927  INR 1.13   Cardiac Enzymes: No results for input(s): CKTOTAL, CKMB, CKMBINDEX, TROPONINI in the last 168 hours. BNP (last 3 results) No results for input(s): PROBNP in the last 8760 hours. HbA1C: No results for input(s): HGBA1C in the last 72 hours. CBG: No results for input(s): GLUCAP in the last 168 hours. Lipid Profile: No results for input(s): CHOL, HDL, LDLCALC, TRIG, CHOLHDL, LDLDIRECT in the last 72 hours. Thyroid Function Tests: No results for input(s): TSH, T4TOTAL, FREET4, T3FREE, THYROIDAB in the last 72 hours. Anemia Panel: No results for input(s): VITAMINB12, FOLATE, FERRITIN, TIBC, IRON, RETICCTPCT in the last 72 hours. Sepsis Labs: Recent Labs  Lab 05/17/18 1232  LATICACIDVEN 1.67    Recent Results (from the past 240 hour(s))  Blood Culture (routine x 2)     Status: None   Collection Time:  05/17/18 12:30 PM  Result Value Ref Range Status   Specimen Description   Final    BLOOD RIGHT ANTECUBITAL Performed at [facility] [address] [place], [place] [address]    Special Requests   Final    BOTTLES DRAWN AEROBIC AND ANAEROBIC Blood  Culture adequate volume Performed at [facility] [address], [place], [place] [address]    Culture   Final    NO GROWTH 5 DAYS Performed at [facility], [place] [address], [place], [place] [address]    Report Status 05/22/2018 FINAL  Final  Blood Culture (routine x 2)     Status: None   Collection Time: 05/17/18 12:59 PM  Result Value Ref Range Status   Specimen Description   Final    BLOOD RIGHT FOREARM Performed at [facility], [place] [address] [place], [place] [address]    Special Requests   Final    BOTTLES DRAWN AEROBIC AND ANAEROBIC Blood Culture adequate volume Performed at [facility] [address], [place], [place] [address]    Culture   Final    NO GROWTH 5 DAYS Performed at [facility], [place] [address], [place], [place] [address]    Report Status 05/22/2018 FINAL  Final  Culture, body fluid-bottle     Status: None   Collection Time: 05/17/18  4:55 PM  Result Value Ref Range Status   Specimen Description FLUID PLEURAL LEFT  Final   Special Requests BOTTLES DRAWN AEROBIC AND ANAEROBIC  Final   Culture   Final    NO GROWTH 5 DAYS Performed at [facility], [place] [address] [place], [place] [address]    Report Status 05/22/2018 FINAL  Final  Gram stain     Status: None   Collection Time: 05/17/18  4:55 PM  Result Value Ref Range Status   Specimen Description FLUID PLEURAL LEFT  Final   Special Requests NONE  Final   Gram Stain   Final    MODERATE WBC PRESENT,BOTH PMN AND MONONUCLEAR NO ORGANISMS SEEN Performed at [facility], [place] [address], [place], [place] [address]    Report Status 05/17/2018 FINAL  Final  MRSA PCR  Screening     Status: None   Collection Time: 05/18/18 12:53 AM  Result Value Ref Range Status   MRSA by PCR NEGATIVE NEGATIVE Final    Comment:        The GeneXpert MRSA Assay (FDA approved for NASAL specimens only), is one component of a comprehensive MRSA colonization surveillance program. It is not intended to diagnose MRSA infection nor to guide or monitor treatment for MRSA infections. Performed at [facility], [place] [address] [place], [place] [address]          Radiology Studies: No results found.      Scheduled Meds: . acetaminophen  650 mg Oral Q6H WA  . ferrous sulfate  325 mg Oral Daily  . levothyroxine  50 mcg Oral Q0600  . lidocaine  1 patch Transdermal Q24H  . simvastatin  40 mg Oral QHS  . tamsulosin  0.4 mg Oral QHS   Continuous Infusions: . sodium chloride 75 mL/hr at 05/24/18 0208  . sodium chloride       LOS: 7 days    Time spent: 35 minutes    Irine Seal, MD [facility] Pager [phone]  If 7PM-7AM, please contact night-coverage [email or web address] Password [facility] 05/24/2018, 9:29 AM

## 2018-05-24 NOTE — Consult Note (Addendum)
Cardiology Consultation:   Patient ID: Kyle Armstrong; [id number]; 1929-12-30   Admit date: 05/17/2018 Date of Consult: 05/24/2018  Primary Care Provider: Lawerance Cruel, MD Primary Cardiologist: Larae Grooms, MD   Chief Complaint: SOB  Patient Profile:   Kyle Armstrong is a 84 y.o. male with a hx of SVT/AVNRT s/p ablation post ablation, chronic diastolic heart failure, hypothyroidism, hyperlipidemia, aortic stenosis (mild by echo 03/2018), gastric cancer, chronic anemia (felt multifactorial from GI blood loss, surgery, chemotherapy/radiation therapy and malnutrition), recent recurrent left pleural effusion who is being seen today for the evaluation of atrial fib at the request of Dr. Grandville Silos. Patient received new diagnosis of large central lung mass this admission.  History of Present Illness:   He was seen by cardiologiy in 03/2018 for worsening SOB and left pleural effusion with severe hyponatremia. 2D echo 04/12/18 showed moderate LVH, EF 55-60%, grade 1 DD, mild AS, moderate LAE. He was diuresed but cardiology also raised concern that hyponatremia and pleural effusion may be related to malignancy so he underwent thoracentesis 04/13/18 with symptom relief. Path was bloody but negative for malignancy. He was discharged to nursing home. He returned 05/17/2018 with difficulty maintaining O2 sats on [place]. CXR showed near total opacification of left hemithorax compatible with recurrent large left pleural effusion. He was started on empiric abx and underwent repeat thoracentesis with negative path. However, CT of the chest showed large central lung mass. IR was consulted for pleural mass biopsy with pathology positive for small cell carcinoma. He's also been transfused for ABL anemia felt due to bloody pleural effusion. Final recs from oncology are pending (plan for hospice if NSCLC, chemo if small cell). He's also been seen by palliative care who appear to be aligning with oncology recs.  Hospital course also made notable by hypotension down into the 70s at times. Most recent labs showed Cr 0.77, Hgb 10.0 (after transfusion), TSH wnl. Cardiology asked to see for atrial fib with controlled HR.  The patient appears quite ill and is sleepy, stating he's gotten very little sleep since he's been here. He is very hard of hearing and only gives me brief answers. He's feeling SOB but unable to really give any more details. No CP. No LEE. Nurse confirms he slept poorly overnight and takes a few minutes to wake up which was my experience with him.   Also noted to have chronic interstitial fibrosis of bilateral lung parenchyma with honeycombing on imaging, felt to be contributing to his hypoxia.    Past Medical History:  Diagnosis Date  . Allergy   . Arthritis   . Eczema   . Gastric cancer ([place]) 10/24/2013  . Hx of radiation therapy 11/10/13-12/21/13   gastric ca, total 50.4Gy  . Hyperlipidemia   . Hypothyroidism   . Lazy eye of right side   . Pernicious anemia   . 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 EP study and ablation  07/16/12   slow pathway ablation for AVNRT by DR Allred  . HERNIA REPAIR  1980's   inguinal done x 2  . IR THORACENTESIS ASP PLEURAL SPACE W/IMG GUIDE  04/13/2018  . LAPAROSCOPIC CHOLECYSTECTOMY W/ CHOLANGIOGRAPHY  2011  . LAPAROSCOPIC PARTIAL GASTRECTOMY N/A 02/15/2014   Procedure: LAPAROSCOPIC diagnositc, distal gastrostomy  feeding jejunostomy;  Surgeon: Stark Klein, MD;  Location: [facility];  Service: General;  Laterality: N/A;  . LAPAROSCOPIC  TRANSEXTRAPERITONEAL INGUINAL HERNIA REPAIR Bilateral 2007   bil recurrent inguinal hernias  . SUPRAVENTRICULAR TACHYCARDIA ABLATION Bilateral 07/16/2012   Procedure: SUPRAVENTRICULAR TACHYCARDIA ABLATION;  Surgeon: Thompson Grayer, MD;  Location: [facility] CATH LAB;  Service: Cardiovascular;  Laterality: Bilateral;     Inpatient  Medications: Scheduled Meds: . acetaminophen  650 mg Oral Q6H WA  . ferrous sulfate  325 mg Oral Daily  . levothyroxine  50 mcg Oral Q0600  . lidocaine  1 patch Transdermal Q24H  . simvastatin  40 mg Oral QHS  . tamsulosin  0.4 mg Oral QHS   Continuous Infusions: . sodium chloride 75 mL/hr at 05/24/18 0208  . sodium chloride     PRN Meds: metoprolol tartrate, morphine, ondansetron **OR** ondansetron (ZOFRAN) IV  Home Meds: Prior to Admission medications   Medication Sig Start Date End Date Taking? Authorizing Provider  acetaminophen (TYLENOL) 500 MG tablet Take 1,000 mg by mouth every 8 (eight) hours as needed (For pain.).   Yes [provider]  albuterol (PROVENTIL HFA;VENTOLIN HFA) 108 (90 Base) MCG/ACT inhaler Inhale 2 puffs into the lungs every 6 (six) hours as needed for wheezing or shortness of breath. 04/16/18  Yes Emokpae, Courage, MD  Amino Acids-Protein Hydrolys (FEEDING SUPPLEMENT, PRO-STAT SUGAR FREE 64,) LIQD Take 30 mLs by mouth daily.   Yes [provider]  aspirin 81 MG chewable tablet Chew 81 mg by mouth daily.   Yes [provider]  cyanocobalamin (,VITAMIN B-12,) 1000 MCG/ML injection Inject 1,000 mcg into the muscle every 30 (thirty) days.   Yes [provider]  ferrous sulfate 325 (65 FE) MG EC tablet Take 325 mg by mouth daily.  06/16/14  Yes Ladell Pier, MD  furosemide (LASIX) 40 MG tablet Take 40 mg by mouth.   Yes [provider]  levothyroxine (SYNTHROID, LEVOTHROID) 50 MCG tablet Take 1 tablet (50 mcg total) by mouth daily before breakfast. 04/16/18  Yes Emokpae, Courage, MD  metoprolol tartrate (LOPRESSOR) 25 MG tablet Take 0.5 tablets (12.5 mg total) by mouth daily. Patient taking differently: Take 12.5 mg by mouth 2 (two) times daily. Hold if SBP is less than 100 or DBP is less than 50. 04/23/18  Yes Fay Records, MD  potassium chloride SA (K-DUR,KLOR-CON) 20 MEQ tablet Take 1 tablet (20 mEq total) by mouth  daily. 06/26/17  Yes Rai, Ripudeep K, MD  simvastatin (ZOCOR) 40 MG tablet Take 1 tablet (40 mg total) by mouth every evening. Patient taking differently: Take 40 mg by mouth at bedtime.  04/16/18  Yes Emokpae, Courage, MD  tamsulosin (FLOMAX) 0.4 MG CAPS capsule Take 1 capsule (0.4 mg total) by mouth daily. Patient taking differently: Take 0.4 mg by mouth at bedtime.  04/16/18  Yes Roxan Hockey, MD    Allergies:    Allergies  Allergen Reactions  . Lipitor [Atorvastatin] Other (See Comments)    Leg cramps  . Amoxicillin Rash and Other (See Comments)    Has patient had a PCN reaction causing immediate rash, facial/tongue/throat swelling, SOB or lightheadedness with hypotension: yes Has patient had a PCN reaction causing severe rash involving mucus membranes or skin necrosis: no Has patient had a PCN reaction that required hospitalization: unknown Has patient had a PCN reaction occurring within the last 10 years: unknown If all of the above answers are "NO", then may proceed with Cephalosporin use.   . Augmentin [Amoxicillin-Pot Clavulanate] Rash and Other (See Comments)    Has patient had a PCN reaction causing immediate rash,  facial/tongue/throat swelling, SOB or lightheadedness with hypotension: yes Has patient had a PCN reaction causing severe rash involving mucus membranes or skin necrosis: no Has patient had a PCN reaction that required hospitalization: unknown Has patient had a PCN reaction occurring within the last 10 years: unknown If all of the above answers are "NO", then may proceed with Cephalosporin use.     Social History:   Social History   Socioeconomic History  . Marital status: Widowed    Spouse name: Not on file  . Number of children: 2  . Years of education: Not on file  . Highest education level: Not on file  Occupational History  . Not on file  Social Needs  . Financial resource strain: Not on file  . Food insecurity:    Worry: Not on file    Inability:  Not on file  . Transportation needs:    Medical: Not on file    Non-medical: Not on file  Tobacco Use  . Smoking status: Former Smoker    Packs/day: 1.00    Years: 30.00    Pack years: 30.00    Types: Cigarettes    Last attempt to quit: 11/24/1990    Years since quitting: 27.5  . Smokeless tobacco: Never Used  Substance and Sexual Activity  . Alcohol use: No  . Drug use: No  . Sexual activity: Never  Lifestyle  . Physical activity:    Days per week: Not on file    Minutes per session: Not on file  . Stress: Not on file  Relationships  . Social connections:    Talks on phone: Not on file    Gets together: Not on file    Attends religious service: Not on file    Active member of club or organization: Not on file    Attends meetings of clubs or organizations: Not on file    Relationship status: Not on file  . Intimate partner violence:    Fear of current or ex partner: Not on file    Emotionally abused: Not on file    Physically abused: Not on file    Forced sexual activity: Not on file  Other Topics Concern  . Not on file  Social History Narrative   Widowed, wife died in 06-Feb-2011 (prior breast cancer patient)   Retired from [employer]   Currently works 3 days/week (4 hours) at [employer] alone, drives    Family History:   The patient's family history includes Heart attack in his father and mother; Hypertension in his unknown relative.  ROS:  Please see the history of present illness.  All other ROS reviewed and negative.     Physical Exam/Data:   Vitals:   05/24/18 0213 05/24/18 0500 05/24/18 0613 05/24/18 0811  BP: (!) 87/64  (!) 85/55 94/64  Pulse: 95  92 98  Resp: 16  16 16   Temp: 97.9 F (36.6 C)  (!) 97.5 F (36.4 C) (!) 97.5 F (36.4 C)  TempSrc:   Oral Oral  SpO2: 94%  97%   Weight:  150 lb 9.2 oz (68.3 kg) 150 lb 9.2 oz (68.3 kg)   Height:        Intake/Output Summary (Last 24 hours) at 05/24/2018 1250 Last data filed at 05/24/2018 0600 Gross per 24  hour  Intake 2459.17 ml  Output 600 ml  Net 1859.17 ml   Filed Weights   05/17/18 1741 05/24/18 0500 05/24/18 0613  Weight: 138 lb 10.7  oz (62.9 kg) 150 lb 9.2 oz (68.3 kg) 150 lb 9.2 oz (68.3 kg)   Body mass index is 23.58 kg/m.  General: Well developed, well nourished WM, in no acute distress. Head: Normocephalic, atraumatic, sclera non-icteric, no xanthomas, nares are without discharge. Neck: Negative for carotid bruits. JVD not elevated. Lungs: Clear bilaterally to auscultation without wheezes, rales, or rhonchi. Breathing is unlabored. Heart: RRR with S1 S2. No murmurs, rubs, or gallops appreciated. Abdomen: Soft, non-tender, non-distended with normoactive bowel sounds. No hepatomegaly. No rebound/guarding. No obvious abdominal masses. Msk:  Strength and tone appear normal for age. Extremities: No clubbing or cyanosis. No edema.  Distal pedal pulses are 2+ and equal bilaterally. Neuro: extremely hard of hearing - initially sleepy but arousable, took several minutes to fully awaken and remain awake, oriented to place and self but brief answers Psych: flat affect  EKG:  The EKG was personally reviewed and demonstrates Initial tracings NSR but f/u tracing 6/27 Atrial fib. Voltage of QRS not significantly different than previous in 03/2018, nonspecific ST-T changes  Relevant CV Studies: See above.  Laboratory Data:  Chemistry Recent Labs  Lab 05/22/18 0445 05/23/18 0451 05/24/18 0514  NA 141 143 141  K 4.4 4.4 4.1  CL 107 111 109  CO2 28 28 25   GLUCOSE 81 99 103*  BUN 22 21 19   CREATININE 0.77 0.69 0.77  CALCIUM 8.3* 8.2* 8.2*  GFRNONAA >60 >60 >60  GFRAA >60 >60 >60  ANIONGAP 6 4* 7    No results for input(s): PROT, ALBUMIN, AST, ALT, ALKPHOS, BILITOT in the last 168 hours. Hematology Recent Labs  Lab 05/22/18 0445 05/23/18 0451 05/24/18 0514  WBC 7.3 7.2 8.1  RBC 3.69* 3.39* 3.33*  HGB 10.9* 10.1* 10.0*  HCT 35.3* 33.2* 33.0*  MCV 95.7 97.9 99.1  MCH 29.5  29.8 30.0  MCHC 30.9 30.4 30.3  RDW 17.1* 17.0* 16.9*  PLT 217 235 262   Cardiac EnzymesNo results for input(s): TROPONINI in the last 168 hours. No results for input(s): TROPIPOC in the last 168 hours.  BNPNo results for input(s): BNP, PROBNP in the last 168 hours.  DDimer No results for input(s): DDIMER in the last 168 hours.  Radiology/Studies:  Dg Chest 1 View  Result Date: 05/21/2018 CLINICAL DATA:  Post thoracentesis Post thoracentesis EXAM: CHEST  1 VIEW COMPARISON:  05/20/2018, 05/18/2018 CT FINDINGS: The heart size is enlarged but difficult to assess given the significant opacity in the lung. There is a moderate loculated LEFT-sided pleural effusion. Aeration in the LEFT lung is limited to the apex, similar to prior studies. There are prominent interstitial markings in the RIGHT lung which appear stable. Chronic change is identified in the LEFT shoulder. IMPRESSION: Persistent significant loculated LEFT pleural effusion and LEFT-sided opacity. Stable appearance of the RIGHT lung. Electronically Signed   By: Nolon Nations M.D.   On: 05/21/2018 13:53   Dg Chest Port 1 View  Result Date: 05/20/2018 CLINICAL DATA:  Shortness of breath, chronic CHF, severe kyphosis EXAM: PORTABLE CHEST 1 VIEW COMPARISON:  CT chest of 05/18/2018 and chest x-ray of 05/17/2017 FINDINGS: The left pleural effusion has reaccumulated after thoracentesis with a large left pleural effusion now present with considerable loss of volume of the left lung and mediastinal shift to the right. Coarse lung markings remain throughout the right lung. Heart size is difficult to assess. No bony abnormality is seen. IMPRESSION: Interval re-accumulation of large left pleural effusion with almost complete compression of the left lung and  mediastinal shift to the right. Electronically Signed   By: Ivar Drape M.D.   On: 05/20/2018 16:19   US Thoracentesis Asp Pleural Space W/img Guide  Result Date: 05/21/2018 INDICATION: Patient  with history of CHF, gastric cancer 2014, dyspnea, recurrent loculated left pleural effusion. Request made for therapeutic left thoracentesis. EXAM: ULTRASOUND GUIDED THERAPEUTIC LEFT THORACENTESIS MEDICATIONS: None COMPLICATIONS: None immediate. PROCEDURE: An ultrasound guided thoracentesis was thoroughly discussed with the patient and questions answered. The benefits, risks, alternatives and complications were also discussed. The patient understands and wishes to proceed with the procedure. Written consent was obtained. Ultrasound was performed to localize and Wayburn Shaler an adequate pocket of fluid in the left chest. The area was then prepped and draped in the normal sterile fashion. 1% Lidocaine was used for local anesthesia. Under ultrasound guidance a 6 Fr Safe-T-Centesis catheter was introduced. Thoracentesis was performed. The catheter was removed and a dressing applied. FINDINGS: A total of approximately 750 cc of bloody fluid was removed. The effusion is multiloculated. IMPRESSION: Successful ultrasound guided therapeutic left thoracentesis yielding 750 cc of pleural fluid. Read by: Rowe Robert, PA-C Electronically Signed   By: Corrie Mckusick D.O.   On: 05/21/2018 13:44    Assessment and Plan:   1. Acute on chronic respiratory failure in the setting of large central obstructing mass and recurrent left pleural effusion 2. Acute blood loss anemia requiring PRBC 3. New onset atrial fib with rates not unexpected for degree of illness 4. Hypotension of unclear cause 5. Mild aortic stenosis by echo 03/2018 with normal LVEF at that time 6. Chronic diastolic CHF, recent symptoms felt less likely due to CHF and moreso r/t #1  We are asked to see Mr. Afzal for his atrial fibrillation. His HR is presently in the 90s-low 100s which does not seem inappropriate for the degree of underlying physiologic stress. Oncology and palliative care are following. There are talks about possible improvement with chemo but I do  remain concerned about Mr. Laurich' overall bigger picture prognosis with the question of underlying interstitial pulmonary fibrosis, acute on chronic anemia, and general appearance of deconditioning. His atrial fibrillation does not appear to be the cause for his hypotension. Earlier this admission, BP was as low as 75/50 while in NSR. CHADSVASC is 4 for CHF, age x2, and vascular atherosclerosis noted on CT scan. Per notes, he is not felt to be a good candidate for anticoagulation given bloody recurrent pleural effusion and I would agree with this as he appears to have limited reserve should he have recurrent bleeding. Therefore, strategy is for rate control only. Recent echo showed only mild AS with normal LV function so I do not necessarily think this needs to be repleted. I do not know that cardiology has much else to offer by way of optimization.  For questions or updates, please contact [place] Please consult [email or web address] for contact info under Cardiology/STEMI.    Signed, Charlie Pitter, PA-C  05/24/2018 12:50 PM  Personally seen and examined. Agree with above.  Currently other than shortness of breath, no chest pain, no syncope.  GEN: Ill-appearing, well developed, in no acute distress  HEENT: normal  Neck: no JVD, carotid bruits, or masses Cardiac: IRRR; 2/6 systolic murmur, no rubs, or gallops,no edema  Respiratory:  clear to auscultation bilaterally, normal work of breathing GI: soft, nontender, nondistended, + BS MS: no deformity or atrophy  Skin: warm and dry, no rash Neuro:  Alert and Oriented x 3, Strength and sensation are  intact Psych: euthymic mood, full affect  Telemetry and EKG personally reviewed-atrial fibrillation rate controlled  Assessment and plan:  New onset atrial fibrillation  -Heart rates currently in the 90s.  Overall well controlled.  Do not believe that he is an anticoagulation candidate given his underlying medical condition, cancer.  Blood pressure  at one-point in the 70s.  This was in normal sinus rhythm.  Therefore, atrial for ablation is not the reason for his hypotension.  Poor overall prognosis.  Rate control only.  Normal EF.  Minimal aortic stenosis, murmur heard on exam.  Lung cancer with recurrent left pleural effusion - Per oncology.  Appears poor prognosis.  Has underlying interstitial pulmonary fibrosis as well as chronic anemia and deconditioning.  No further cardiology recommendations at this time.  Please let us know if we can be of further assistance.  Candee Furbish, MD

## 2018-05-24 NOTE — Progress Notes (Signed)
Occupational Therapy Treatment Patient Details Name: MATEUSZ NEILAN MRN: [id number] DOB: [birth year] Today's Date: 05/24/2018    History of present illness RUBY DILONE is a 84 y.o. male with medical history significant of chronic diastolic heart failure, hyperlipidemia, BPH who was recently hospitalized for on chronic diastolic congestive heart failure, large left-sided pleural effusion which was removed with thoracentesis on 5/21. Admitted with hypoxia on normal O2 @ 2LPM, requiring increase to 4LPM to maintain sats.  Xray revealed near total opacification left hemithorax compatible with large pleural effusion.  Now s/p thoracentesis.    OT comments  Pt with decreased participation this day.  RN aware  Follow Up Recommendations  SNF    Equipment Recommendations  None recommended by OT    Recommendations for Other Services      Precautions / Restrictions Precautions Precautions: Fall Precaution Comments: oxygen dependent       Mobility Bed Mobility               General bed mobility comments: total A for reposition  Transfers                 General transfer comment: refused OOB        ADL either performed or assessed with clinical judgement   ADL Overall ADL's : Needs assistance/impaired Eating/Feeding: Moderate assistance;Bed level Eating/Feeding Details (indicate cue type and reason): Repositioned pt in bed and propped arms on pillows to make self feeding easier on pt.  Pt had some tea and some milkshake, but refused any other lunch.                                     General ADL Comments: limited participation this day.  Pt did feed self a limited amount                Cognition Arousal/Alertness: Awake/alert Behavior During Therapy: WFL for tasks assessed/performed Overall Cognitive Status: Within Functional Limits for tasks assessed                                                     Pertinent  Vitals/ Pain       Pain Score: 5  Pain Location: back Pain Descriptors / Indicators: Dull;Sore Pain Intervention(s): Limited activity within patient's tolerance;Repositioned;Patient requesting pain meds-RN notified         Frequency  Min 2X/week        Progress Toward Goals  OT Goals(current goals can now be found in the care plan section)  Progress towards OT goals: Progressing toward goals     Plan Discharge plan remains appropriate    Co-evaluation                 AM-PAC PT "6 Clicks" Daily Activity     Outcome Measure   Help from another person eating meals?: A Lot Help from another person taking care of personal grooming?: A Lot Help from another person toileting, which includes using toliet, bedpan, or urinal?: A Lot Help from another person bathing (including washing, rinsing, drying)?: A Lot Help from another person to put on and taking off regular upper body clothing?: A Lot Help from another person to put on and taking off regular lower body clothing?: A Lot 6 Click  Score: 12    End of Session    OT Visit Diagnosis: Unsteadiness on feet (R26.81);Muscle weakness (generalized) (M62.81);Repeated falls (R29.6);History of falling (Z91.81)   Activity Tolerance Patient tolerated treatment well   Patient Left in bed   Nurse Communication Mobility status        Time: 8315-1761 OT Time Calculation (min): 14 min  Charges: OT General Charges $OT Visit: 1 Visit OT Treatments $Self Care/Home Management : 8-22 mins  [place], [place]   Betsy Pries 05/24/2018, 1:36 PM

## 2018-05-24 NOTE — Progress Notes (Addendum)
Physical Therapy Treatment Patient Details Name: Kyle Armstrong MRN: [id number] DOB: 06/23/1930 Today's Date: 05/24/2018    History of Present Illness Kyle Armstrong is a 84 y.o. male with medical history significant of chronic diastolic heart failure, hyperlipidemia, BPH who was recently hospitalized for on chronic diastolic congestive heart failure, large left-sided pleural effusion which was removed with thoracentesis on 5/21. Admitted with hypoxia on normal O2 @ 2LPM, requiring increase to 4LPM to maintain sats.  Xray revealed near total opacification left hemithorax compatible with large pleural effusion.  Now s/p thoracentesis.     PT Comments    Assisted patient with supine to sit, sit to stand, and standing pivot to recliner. Patient has a significantly flexed trunk and is hard of hearing (able to hear better on L side).Patient required +2 min assist to for bed mobility and transfers. Checked vitals in supine: BP: 93/65, O2: 100%, HR: 107; in sitting EOB: BP:80/59, O2: 92%, HR: 115; in recliner: BP: 80/64, O2: 90%, HR: 104. Patient was lethargic and groggy initially and alert and oriented by end of therapy session. Ambulation was not attempted due to limited activity tolerance.   Follow Up Recommendations  SNF     Equipment Recommendations  None recommended by PT    Recommendations for Other Services       Precautions / Restrictions Precautions Precautions: Fall Precaution Comments: oxygen dependent Restrictions Weight Bearing Restrictions: No    Mobility  Bed Mobility Overal bed mobility: Needs Assistance Bed Mobility: Supine to Sit     Supine to sit: +2 for physical assistance     General bed mobility comments: physcial assistance to move LE's, trunk, and scoot to EOB  Transfers Overall transfer level: Needs assistance   Transfers: Sit to/from Stand Sit to Stand: +2 physical assistance;Min assist Stand pivot transfers: +2 physical assistance;Min assist        General transfer comment: transfered from bed to chair with physical assistance  Ambulation/Gait                 Stairs             Wheelchair Mobility    Modified Rankin (Stroke Patients Only)       Balance                                            Cognition Arousal/Alertness: Awake/alert Behavior During Therapy: WFL for tasks assessed/performed Overall Cognitive Status: Within Functional Limits for tasks assessed                                        Exercises      General Comments        Pertinent Vitals/Pain Pain Assessment: Faces Pain Score: 5  Faces Pain Scale: Hurts little more Pain Location: back Pain Descriptors / Indicators: Dull;Sore Pain Intervention(s): Limited activity within patient's tolerance;Repositioned;Monitored during session    Home Living                      Prior Function            PT Goals (current goals can now be found in the care plan section) Progress towards PT goals: Progressing toward goals    Frequency    Min 2X/week  PT Plan Current plan remains appropriate    Co-evaluation              AM-PAC PT "6 Clicks" Daily Activity  Outcome Measure  Difficulty turning over in bed (including adjusting bedclothes, sheets and blankets)?: A Lot Difficulty moving from lying on back to sitting on the side of the bed? : Unable Difficulty sitting down on and standing up from a chair with arms (e.g., wheelchair, bedside commode, etc,.)?: Unable Help needed moving to and from a bed to chair (including a wheelchair)?: A Little Help needed walking in hospital room?: A Lot Help needed climbing 3-5 steps with a railing? : Total 6 Click Score: 10    End of Session Equipment Utilized During Treatment: Gait belt;Oxygen Activity Tolerance: Patient tolerated treatment well;Patient limited by fatigue Patient left: in chair;with chair alarm set;with call bell/phone  within reach   PT Visit Diagnosis: Other abnormalities of gait and mobility (R26.89);Muscle weakness (generalized) (M62.81)     Time:  - 14:45 - 15:15    Charges:    1 gt  1 ta                   G Codes:       Rachel Bo, SPTA 05/24/2018, 3:34 PM  Direct supervision during session and agree with above  Rica Koyanagi  PTA [facility]  Acute  Rehab Pager      [phone]

## 2018-05-25 ENCOUNTER — Inpatient Hospital Stay (HOSPITAL_COMMUNITY): Payer: Medicare HMO

## 2018-05-25 DIAGNOSIS — J90 Pleural effusion, not elsewhere classified: Secondary | ICD-10-CM

## 2018-05-25 DIAGNOSIS — S2241XA Multiple fractures of ribs, right side, initial encounter for closed fracture: Secondary | ICD-10-CM

## 2018-05-25 DIAGNOSIS — R0602 Shortness of breath: Secondary | ICD-10-CM

## 2018-05-25 LAB — CBC
HCT: 32 % — ABNORMAL LOW (ref 39.0–52.0)
HEMOGLOBIN: 9.5 g/dL — AB (ref 13.0–17.0)
MCH: 29.7 pg (ref 26.0–34.0)
MCHC: 29.7 g/dL — ABNORMAL LOW (ref 30.0–36.0)
MCV: 100 fL (ref 78.0–100.0)
PLATELETS: 220 10*3/uL (ref 150–400)
RBC: 3.2 MIL/uL — ABNORMAL LOW (ref 4.22–5.81)
RDW: 16.7 % — ABNORMAL HIGH (ref 11.5–15.5)
WBC: 7.4 10*3/uL (ref 4.0–10.5)

## 2018-05-25 LAB — COMPREHENSIVE METABOLIC PANEL
ALT: 11 U/L (ref 0–44)
AST: 20 U/L (ref 15–41)
Albumin: 2.1 g/dL — ABNORMAL LOW (ref 3.5–5.0)
Alkaline Phosphatase: 80 U/L (ref 38–126)
Anion gap: 6 (ref 5–15)
BILIRUBIN TOTAL: 0.5 mg/dL (ref 0.3–1.2)
BUN: 25 mg/dL — AB (ref 8–23)
CALCIUM: 8.4 mg/dL — AB (ref 8.9–10.3)
CO2: 26 mmol/L (ref 22–32)
CREATININE: 0.89 mg/dL (ref 0.61–1.24)
Chloride: 112 mmol/L — ABNORMAL HIGH (ref 98–111)
GFR calc Af Amer: >60 mL/min (ref >60)
Glucose, Bld: 126 mg/dL — ABNORMAL HIGH (ref 70–99)
Potassium: 4.4 mmol/L (ref 3.5–5.1)
Sodium: 144 mmol/L (ref 135–145)
TOTAL PROTEIN: 5.1 g/dL — AB (ref 6.5–8.1)

## 2018-05-25 LAB — HEPATIC FUNCTION PANEL
ALK PHOS: 80 U/L (ref 38–126)
ALT: 11 U/L (ref 0–44)
AST: 20 U/L (ref 15–41)
Albumin: 2.2 g/dL — ABNORMAL LOW (ref 3.5–5.0)
Bilirubin, Direct: 0.1 mg/dL (ref 0.0–0.2)
Indirect Bilirubin: 0.4 mg/dL (ref 0.3–0.9)
TOTAL PROTEIN: 5 g/dL — AB (ref 6.5–8.1)
Total Bilirubin: 0.5 mg/dL (ref 0.3–1.2)

## 2018-05-25 MED ORDER — SODIUM CHLORIDE 0.9 % IV SOLN
10.0000 mg | Freq: Once | INTRAVENOUS | Status: DC
  Filled 2018-05-25: qty 1

## 2018-05-25 MED ORDER — ALTEPLASE 2 MG IJ SOLR
2.0000 mg | Freq: Once | INTRAMUSCULAR | Status: DC | PRN
  Filled 2018-05-25: qty 2

## 2018-05-25 MED ORDER — HEPARIN SOD (PORK) LOCK FLUSH 100 UNIT/ML IV SOLN: 250.0000 [IU] | Freq: Once | INTRAVENOUS | Status: DC | PRN

## 2018-05-25 MED ORDER — SODIUM CHLORIDE 0.9% FLUSH: 10.0000 mL | INTRAVENOUS | Status: DC | PRN

## 2018-05-25 MED ORDER — SODIUM CHLORIDE 0.9 % IV SOLN
304.8000 mg | Freq: Once | INTRAVENOUS | Status: DC
  Filled 2018-05-25: qty 30

## 2018-05-25 MED ORDER — HEPARIN SOD (PORK) LOCK FLUSH 100 UNIT/ML IV SOLN: 500.0000 [IU] | Freq: Once | INTRAVENOUS | Status: DC | PRN

## 2018-05-25 MED ORDER — LIDOCAINE HCL 1 % IJ SOLN
INTRAMUSCULAR | Status: AC
  Filled 2018-05-25: qty 20

## 2018-05-25 MED ORDER — SODIUM CHLORIDE 0.9 % IV SOLN: Freq: Once | INTRAVENOUS | Status: DC

## 2018-05-25 MED ORDER — SODIUM CHLORIDE 0.9 % IV SOLN
75.0000 mg/m2 | Freq: Once | INTRAVENOUS | Status: DC
  Filled 2018-05-25: qty 7

## 2018-05-25 MED ORDER — SODIUM CHLORIDE 0.9% FLUSH: 3.0000 mL | INTRAVENOUS | Status: DC | PRN

## 2018-05-25 MED ORDER — HOT PACK MISC ONCOLOGY
1.0000 | Freq: Once | Status: AC | PRN
  Filled 2018-05-25: qty 1

## 2018-05-25 MED ORDER — PALONOSETRON HCL INJECTION 0.25 MG/5ML
0.2500 mg | Freq: Once | INTRAVENOUS | Status: DC
  Filled 2018-05-25: qty 5

## 2018-05-25 NOTE — Progress Notes (Signed)
Non face to face prolonged encounter  Called and discussed patient's status with his daughterMargarita Armstrong. She tells me that prior to admission he was having some confusion- using the remote control as a telephone at times, forgetting where he was.  Kyle Armstrong expressed concern that treating her father with chemotherapy will not improve his overall functional status. She feels that he does not understand that the chemotherapy will not "fix" him back to where he was prior to his initial hospital admission.  I reviewed with Kyle Armstrong that before today, when patient was awake and alert, I had GOC discussion with her Dad and he expressed desire to attempt chemotherapy if it had a chance of improving his functional status and quality of life- even if only temporarily. He was not at a point where he was ready to comfort care and Hospice.  Kyle Armstrong states that she doesn't want her father to die, however, she doesn't want to prolong his life unnecessarily and prolong his suffering. She is concerned that he is suffering.  I discussed with Kyle Armstrong that the hope is that the chemo will shrink Kyle Armstrong's tumor and he will be able to breathe better and this will decrease his suffering. However, it does not guarantee that he will return to level of function that he was at prior to this hospitalization.  I did discuss this with [name] before and he was complacent to return to SNF- I shared this with Kyle Armstrong as well.   Kyle Armstrong would like to discuss the chemotherapy and what the expectations are with Dr. Benay Spice.  Mariana Kaufman, AGNP-C Palliative Medicine  Please call Palliative Medicine team phone with any questions [phone]. For individual providers please see AMION.  Start time: 1615 Stop time: 1700 Prolonged services billed: yes

## 2018-05-25 NOTE — Progress Notes (Addendum)
IP PROGRESS NOTE  Subjective:   Mr. Depaz is alert, no specific complaint. Objective: Vital signs in last 24 hours: Blood pressure (!) (P) 85/58, pulse (P) 84, temperature (P) 97.8 F (36.6 C), temperature source (P) Oral, resp. rate (P) 16, height '5\' 7"'$  (1.702 m), weight 150 lb 9.2 oz (68.3 kg), SpO2 91 %.  Intake/Output from previous day: 07/01 0701 - 07/02 0700 In: 750 [I.V.:750] Out: 50 [Urine:50]  Physical Exam:  HEENT:no thrush Lungs: Decreased breath sounds throughout the left chest Cardiac: Irregular Extremities: No leg edema GI: Abdomen soft and nontender    Lab Results: Recent Labs    05/24/18 0514 05/25/18 0620  WBC 8.1 7.4  HGB 10.0* 9.5*  HCT 33.0* 32.0*  PLT 262 220    BMET Recent Labs    05/24/18 0514 05/25/18 0620  NA 141 144  K 4.1 4.4  CL 109 112*  CO2 25 26  GLUCOSE 103* 126*  BUN 19 25*  CREATININE 0.77 0.89  CALCIUM 8.2* 8.4*   Alkaline phosphatase 80, albumin 2.2, AST 20, ALT 11, bilirubin 0.5  Medications: I have reviewed the patient's current medications.  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   Initiation radiation and concurrent Xeloda 11/10/2013, completed 12/21/2013   Restaging CTs of the chest, abdomen, and pelvis on 01/25/2014 with persistent antral thickening, no evidence of metastatic disease.   Distal gastrectomy with Billroth II anastomosis 02/15/2014 (y pT1b, y pN0).  CT scans 03/14/2016 for evaluation of mid abdominal and back pain-no acute findings within the abdomen or pelvis. Postoperative changes identified compatible distal gastrectomy with gastrojejunostomy.  2. Diffuse "gastritis "change noted on endoscopies 08/19/2013 and 10/06/2013 with biopsies confirming atrophic gastritis. Low and high-grade dysplasia were noted on the biopsy 08/19/2013  3.  Anemia-likely secondary to chronic disease,  malnutrition, and potentially metastatic carcinoma involving the bone marrow 4. History of anorexia/weight loss.  5. History of colon polyp.  6. History of "sarcoidosis", pulmonary fibrosis noted on the chest CT 10/27/2013.  7. History of tobacco use.  8. History of arrhythmia, status post an ablation August 2013.  9.ChronicBack pain-most likely secondary to a benign musculoskeletal condition 10.  Admission 05/17/2018 with respiratory failure  Left pleural effusion on chest x-ray 04/10/2018, left thoracentesis 04/13/2018-bloody fluid, negative cytology  Large left effusion on admission chest x-ray, cytology negative  CT chest 05/18/2018- central left lung mass, left hilar/mediastinal adenopathy, left pleural nodularity  Ultrasound-guided biopsy of left posterior pleural-based mass 05/20/2018-small cell carcinoma   Kyle Armstrong appears unchanged, though he has required increased amounts of oxygen since last night.  He has minimal air movement in the left chest.  I will request a repeat therapeutic thoracentesis. He has small cell lung cancer.  His family is not present this morning.  I previously discussed the plan for etoposide/carboplatin with Mr. Motta and his family.  He agrees to proceed with chemotherapy today.  We again reviewed potential toxicities associated with etoposide/carboplatin including the chance for alopecia, mouth sores, an allergic reaction, and hematologic toxicity.    Recommendations: 1.  Repeat therapeutic thoracentesis 2.  Cycle 1 etoposide/carboplatin today      LOS: 8 days   Betsy Coder, MD   05/25/2018, 7:17 AM  He became more confused today and was unable to sign consent for chemotherapy.  Oxygenation is better after a thoracentesis today.  We decided to hold chemotherapy today.  I discussed the situation with his  son by telephone.  We will decide on chemotherapy versus hospice based on his condition on 7/3. His son and daughter will meet me in his  room in the AM 7/3

## 2018-05-25 NOTE — Progress Notes (Signed)
PROGRESS NOTE    Kyle Armstrong  KDT:[id number] DOB: July 12, 1930 DOA: 05/17/2018 PCP: Lawerance Cruel, MD   Brief Narrative: Pt. with PMH of chronic diastolic CHF, HLD, BPH; admitted on 05/17/2018, presented with complaint of shortness of breath, was found to have recurrent left pleural effusion secondary to left lung mass. Currently further plan is continue close monitoring and supportive measures as well as follow-up on recommendations from oncology.     Assessment & Plan:   Principal Problem:   Acute hypoxemic respiratory failure (HCC) Active Problems:   Hyperlipidemia   Hypothyroidism   Pleural effusion   Lung mass   Goals of care, counseling/discussion   Advance care planning   Palliative care by specialist   S/P thoracentesis   Acute blood loss anemia   New onset a-fib ([place])   Small cell lung cancer ([place])  .  Acute on chronic hypoxic respiratory failure. Recurrent left pleural effusion-hemothorax.  Likely malignant Left lung cancer with mediastinal mass.   prior history of gastric carcinoma treated with surgery and in remission. Baseline uses 2 L of oxygen. Status post thoracentesis with 1.3 L of bloody fluid removed 05/18/2018.  Concern for malignant pleural effusion.   Cytology as well as infectious work-up from prior thoracentesis was negative although CT of the chest performed this admission shows large central left lung mass roughly 7 x 8 cm size contiguous with left hilum mass along the pulmonary artery probably involving pleura and pericardium along with paraesophageal, bilateral hilar subcarinal adenopathy.  Improved but persistent left hemithorax compatible with stage IV lung cancer. Oncology consulted and patient seen in consultation by Dr. Benay Spice. Cytology results were negative for malignancy and as such oncology consulted with IR for biopsy of pleural mass which was done 05/20/2018 and biopsy results pending.  Patient noted to have systolic blood pressures  in the 80s-90s on 05/22/2018.  Systolic blood pressures now in the low 90s..  Hemoglobin at 10.0 from 10.1 from 10.9 from 9.6 from 10.0 from 7.4 after transfusion of 2 units packed red blood cells. Follow H&H. Initially thought to be fluid related to CHF and therefore patient was given IV Lasix will hold off on that right now. Even though no active bleeding noted on the CT scan, should the patient has persistent hemodynamic instability or no improvement in anemia may benefit from IR consultation for embolization. Patient with a worsening shortness of breath and chest x-ray with reaccumulation of pleural effusion on the left.  Patient seen by interventional radiology and underwent therapeutic ultrasound-guided thoracentesis on 05/21/2018 with bloody fluid removed.   Discussed with oncology as to whether patient may benefit from radiation oncology evaluation for possible radiation to shrink mass noted on chest x-ray to see if this may help with his respiratory status. Palliative care following. Per oncology preliminary results consistent with small cell lung cancer.  Patient started on chemotherapy per oncology 05/24/2018.   Patient noted per oncology note to have worsening shortness of breath this morning and ultrasound-guided thoracentesis ordered which patient subsequently underwent with a 650 cc of bloody fluid removed.  Patient with some symptomatic relief post thoracentesis.  Patient getting chemotherapy.  Per oncology.  2.  Acute blood loss anemia. Likely secondary to bloody pleural effusion.  Status post 1 unit packed red blood cells.  Hemoglobin at 9.5 from 10.0 from 10.1 from 10.9 from 9.6 from 10.0 from 7.4.  Patient with systolic blood pressures in the low 80s.  Status post 2 units packed red blood cells.  Follow H&H. Transfusion threshold hemoglobin less than 7 or hemodynamic instability.  3..  New onset A. fib Patient noted to go into atrial fibrillation the evening of 05/20/2018, heart rate  ranging from the 70s to the 110s.  Likely secondary to acute respiratory issues as in #1.  Patient with systolic blood pressures in the 80s-90s this morning however mentating well.  TSH within normal limits at 1.372.  2D echo done on 04/12/2018 with a EF of 55 to 36%, grade 1 diastolic dysfunction, mild aortic valvular stenosis, left pleural effusion.  Patient not an anticoagulation candidate as thoracentesis done was bloody.  Patient status post transfusion of packed red blood cells.  Repeat 2D echo cancelled per cardiology.  TSH within normal limits.  Due to borderline blood pressure and new onset A. fib cardiology consulted and no further work-up recommended at this time.  Follow.  4.  Chronic diastolic CHF. Patient with significant O2 requirements.  Patient currently on 6 L nasal cannula.  Increased O2 requirements will be secondary to problem #1.  Patient with some swelling in his upper extremities and scrotal region.  Blood pressure in the 70s to 80s.  Continue to hold diuretics and antihypertensive medications for now.  Cardiology following.    5.  Dyslipidemia. Statin.    6.  BPH. Continue Flomax.   7.  Hypothyroidism. TSH was 1.372 on 04/11/2018.  Continue Synthroid.  8.  Hypertension Patient noted to be hypotensive/borderline blood pressure.  Antihypertensive medications on hold.  Gentle hydration.     9.  Chronic interstitial fibrosis of bilateral lung parenchyma with honeycombing. Could partly be contributing to patient's hypoxia.  Follow for now.  10.  Hypotension Questionable etiology.  Patient with no signs or symptoms of infection.  May be secondary to A. fib versus problem #1.  Patient mentating well.  Blood pressure with no significant improvement.  Monitor closely.  Follow.       DVT prophylaxis: SCDs Code Status: DNR Family Communication: Updated patient.  No family at bedside. Disposition Plan: Likely skilled nursing facility.  Per oncology.     Consultants:     Oncology: Dr. Benay Spice 05/19/2018  Palliative care: Mariana Kaufman, NP 05/19/2018  Interventional radiology  Cardiology: Dr. Marlou Porch 05/24/2018  Procedures:   CT chest 05/18/2018  Chest x-ray 05/17/2018  Ultrasound guided thoracentesis 05/17/2018--1.3 L of dark bloody fluid removed.  Per Rowe Robert, PA 05/17/2018  2 units packed red blood cells 05/19/2018  1 unit packed red blood cells 05/18/2018  Ultrasound-guided core biopsy left posterior pleural mass 05/20/2018  Ultrasound-guided thoracentesis 05/21/2018  Ultrasound-guided thoracentesis 05/25/2018  Antimicrobials:  IV vancomycin 624 [id number] dose IV Levaquin 624 [id number] dose   Subjective: Patient just returned from ultrasound-guided thoracentesis.  Patient drowsy.    Objective: Vitals:   05/24/18 2110 05/24/18 2253 05/25/18 0512 05/25/18 0533  BP: (!) 83/64  (!) 85/58   Pulse: 90 100 84   Resp: 16  16   Temp: 98.9 F (37.2 C)  97.8 F (36.6 C)   TempSrc: Oral  Oral   SpO2: (!) 84% 100% 91% 91%  Weight:   73.5 kg (162 lb 0.6 oz)   Height:        Intake/Output Summary (Last 24 hours) at 05/25/2018 1140 Last data filed at 05/24/2018 2028 Gross per 24 hour  Intake 750 ml  Output 50 ml  Net 700 ml   Filed Weights   05/24/18 0500 05/24/18 0613 05/25/18 0512  Weight: 68.3 kg (150 lb 9.2 oz)  68.3 kg (150 lb 9.2 oz) 73.5 kg (162 lb 0.6 oz)    Examination:  General exam: Frail.  Cachectic.  Drowsy. Respiratory system: Decreased breath sounds on the left.  Crackles in the right base.    Cardiovascular system: Irregularly irregular.  No lower extremity edema.  No JVD.   Gastrointestinal system: Abdomen is nontender, nondistended, soft, positive bowel sounds.  No rebound.  No guarding.   Central nervous system: Drowsy.  No focal neurological deficits. Extremities: Symmetric 5 x 5 power. Skin: No rashes, lesions or ulcers Psychiatry: Judgement and insight appear normal. Mood & affect appropriate.     Data Reviewed: I have  personally reviewed following labs and imaging studies  CBC: Recent Labs  Lab 05/19/18 0846  05/21/18 0454 05/22/18 0445 05/23/18 0451 05/24/18 0514 05/25/18 0620  WBC 7.7   < > 7.1 7.3 7.2 8.1 7.4  NEUTROABS 6.3  --   --   --   --   --   --   HGB 7.4*   < > 9.6* 10.9* 10.1* 10.0* 9.5*  HCT 23.7*   < > 31.4* 35.3* 33.2* 33.0* 32.0*  MCV 94.8   < > 95.7 95.7 97.9 99.1 100.0  PLT 261   < > 221 217 235 262 220   < > = values in this interval not displayed.   Basic Metabolic Panel: Recent Labs  Lab 05/19/18 0846 05/20/18 0450 05/21/18 0454 05/22/18 0445 05/23/18 0451 05/24/18 0514 05/25/18 0620  NA 139 140 140 141 143 141 144  K 4.1 4.3 4.2 4.4 4.4 4.1 4.4  CL 101 104 105 107 111 109 112*  CO2 30 30 28 28 28 25 26   GLUCOSE 105* 114* 100* 81 99 103* 126*  BUN 23 23 23 22 21 19  25*  CREATININE 0.87 0.89 0.88 0.77 0.69 0.77 0.89  CALCIUM 8.5* 8.5* 8.4* 8.3* 8.2* 8.2* 8.4*  MG 1.8 1.9  --  1.9 2.0  --   --    GFR: Estimated Creatinine Clearance: 55.7 mL/min (by C-G formula based on SCr of 0.89 mg/dL). Liver Function Tests: Recent Labs  Lab 05/25/18 0620  AST 20  20  ALT 11  11  ALKPHOS 80  80  BILITOT 0.5  0.5  PROT 5.0*  5.1*  ALBUMIN 2.2*  2.1*   No results for input(s): LIPASE, AMYLASE in the last 168 hours. No results for input(s): AMMONIA in the last 168 hours. Coagulation Profile: No results for input(s): INR, PROTIME in the last 168 hours. Cardiac Enzymes: No results for input(s): CKTOTAL, CKMB, CKMBINDEX, TROPONINI in the last 168 hours. BNP (last 3 results) No results for input(s): PROBNP in the last 8760 hours. HbA1C: No results for input(s): HGBA1C in the last 72 hours. CBG: No results for input(s): GLUCAP in the last 168 hours. Lipid Profile: No results for input(s): CHOL, HDL, LDLCALC, TRIG, CHOLHDL, LDLDIRECT in the last 72 hours. Thyroid Function Tests: Recent Labs    05/24/18 0855  TSH 2.896   Anemia Panel: No results for  input(s): VITAMINB12, FOLATE, FERRITIN, TIBC, IRON, RETICCTPCT in the last 72 hours. Sepsis Labs: No results for input(s): PROCALCITON, LATICACIDVEN in the last 168 hours.  Recent Results (from the past 240 hour(s))  Blood Culture (routine x 2)     Status: None   Collection Time: 05/17/18 12:30 PM  Result Value Ref Range Status   Specimen Description   Final    BLOOD RIGHT ANTECUBITAL Performed at [facility], [address] [place], [place] [address]    Special Requests   Final    BOTTLES DRAWN AEROBIC AND ANAEROBIC Blood Culture adequate volume Performed at [facility] [address], [place], [place] [address]    Culture   Final    NO GROWTH 5 DAYS Performed at [facility], [place] [address], [place], [place] [address]    Report Status 05/22/2018 FINAL  Final  Blood Culture (routine x 2)     Status: None   Collection Time: 05/17/18 12:59 PM  Result Value Ref Range Status   Specimen Description   Final    BLOOD RIGHT FOREARM Performed at [facility], [place] [address] [place], [place] [address]    Special Requests   Final    BOTTLES DRAWN AEROBIC AND ANAEROBIC Blood Culture adequate volume Performed at [facility] [address], [place], [place] [address]    Culture   Final    NO GROWTH 5 DAYS Performed at [facility], [place] [address] [place], [place] [address]    Report Status 05/22/2018 FINAL  Final  Culture, body fluid-bottle     Status: None   Collection Time: 05/17/18  4:55 PM  Result Value Ref Range Status   Specimen Description FLUID PLEURAL LEFT  Final   Special Requests BOTTLES DRAWN AEROBIC AND ANAEROBIC  Final   Culture   Final    NO GROWTH 5 DAYS Performed at [facility], [place] [address], [place], [place] [address]    Report Status 05/22/2018 FINAL  Final  Gram stain     Status: None   Collection Time: 05/17/18  4:55 PM  Result Value Ref Range Status   Specimen  Description FLUID PLEURAL LEFT  Final   Special Requests NONE  Final   Gram Stain   Final    MODERATE WBC PRESENT,BOTH PMN AND MONONUCLEAR NO ORGANISMS SEEN Performed at [facility], [place] [address], [place], [place] [address]    Report Status 05/17/2018 FINAL  Final  MRSA PCR Screening     Status: None   Collection Time: 05/18/18 12:53 AM  Result Value Ref Range Status   MRSA by PCR NEGATIVE NEGATIVE Final    Comment:        The GeneXpert MRSA Assay (FDA approved for NASAL specimens only), is one component of a comprehensive MRSA colonization surveillance program. It is not intended to diagnose MRSA infection nor to guide or monitor treatment for MRSA infections. Performed at [facility], [place] [address] [place],  [address]          Radiology Studies: No results found.      Scheduled Meds: . acetaminophen  650 mg Oral Q6H WA  . CARBOplatin  300 mg Intravenous Once  . etoposide  75 mg/m2 (Treatment Plan Recorded) Intravenous Once  . ferrous sulfate  325 mg Oral Daily  . levothyroxine  50 mcg Oral Q0600  . lidocaine  1 patch Transdermal Q24H  . lidocaine      . palonosetron  0.25 mg Intravenous Once  . simvastatin  40 mg Oral QHS  . tamsulosin  0.4 mg Oral QHS   Continuous Infusions: . sodium chloride 75 mL/hr at 05/24/18 1600  . sodium chloride    . dexamethasone (DECADRON) IVPB [facility]    . sodium chloride       LOS: 8 days    Time spent: 35 minutes  Irine Seal, MD [facility] Pager [phone]  If 7PM-7AM, please contact night-coverage [email or web address] Password [email or web address] 05/25/2018, 11:40 AM

## 2018-05-25 NOTE — Progress Notes (Signed)
Daily Progress Note   Patient Name: Kyle Armstrong       Date: 05/25/2018 DOB: 21-Dec-1929  Age: 84 y.o. MRN#: [id number] Attending Physician: Kyle Filler, MD Primary Care Physician: Kyle Cruel, MD Admit Date: 05/17/2018  Reason for Consultation/Follow-up: Establishing goals of care  Subjective: Patient in bed awake, but confused. He tells me he should be at [place] with his wife Kyle Armstrong (who died 5 years ago). He is visibly SOB. He appears to be anxious. When I ask him about his chemotherapy he tells me he "wishes he was in a place where it would be safe for him to get his chemo". He then asks me not to leave him, because if I do, he will die. First chemotherapy treatment was planned for today- however, has not been able to be given due to patient's confusion.  Per Kyle Mercury, RN- patient asked for chemo this morning- however, when asked to sign consent form he refused. Currently, I do not think he is able to sign his own consent.  Noted thoracentesis today with 650cc dark bloody fluid off. Chest xray post thoracentesis shows continued multiloculated pleural effusion essentially unchanged. His lunch tray is at bedside- he has not eaten, tells me he does not want to eat, he just wants a coke.   ROS  Length of Stay: 8  Current Medications: Scheduled Meds:  . acetaminophen  650 mg Oral Q6H WA  . CARBOplatin  300 mg Intravenous Once  . etoposide  75 mg/m2 (Treatment Plan Recorded) Intravenous Once  . ferrous sulfate  325 mg Oral Daily  . levothyroxine  50 mcg Oral Q0600  . lidocaine  1 patch Transdermal Q24H  . lidocaine      . palonosetron  0.25 mg Intravenous Once  . simvastatin  40 mg Oral QHS  . tamsulosin  0.4 mg Oral QHS    Continuous Infusions: . sodium chloride 75  mL/hr at 05/24/18 1600  . sodium chloride    . dexamethasone (DECADRON) IVPB [facility]    . sodium chloride      PRN Meds: alteplase, heparin lock flush, heparin lock flush, Hot Pack, metoprolol tartrate, morphine CONCENTRATE, sodium chloride flush, sodium chloride flush  Physical Exam  Constitutional:  cachetic  Pulmonary/Chest:  Wet sounding nonproductive cough, increased WOB at rest  Neurological: He  is alert.  Not oriented to place, time or situation  Skin: There is pallor.  Nursing note and vitals reviewed.           Vital Signs: BP 92/61 (BP Location: Left Arm)   Pulse (!) 109 Comment: informed RN  Temp 97.7 F (36.5 C) (Oral)   Resp 18   Ht 5\' 7"  (1.702 m)   Wt 73.5 kg (162 lb 0.6 oz)   SpO2 91%   BMI 25.38 kg/m  SpO2: SpO2: 91 % O2 Device: O2 Device: Nasal Cannula O2 Flow Rate: O2 Flow Rate (L/min): 6 L/min  Intake/output summary:   Intake/Output Summary (Last 24 hours) at 05/25/2018 1636 Last data filed at 05/25/2018 1534 Gross per 24 hour  Intake 0 ml  Output 250 ml  Net -250 ml   LBM: Last BM Date: 05/25/18 Baseline Weight: Weight: 62.4 kg (137 lb 8 oz) Most recent weight: Weight: 73.5 kg (162 lb 0.6 oz)       Palliative Assessment/Data: PPS: 20%      Patient Active Problem List   Diagnosis Date Noted  . Small cell lung cancer ([place]) 05/24/2018  . New onset a-fib ([place]) 05/21/2018  . Lung mass   . Goals of care, counseling/discussion   . Advance care planning   . Palliative care by specialist   . S/P thoracentesis   . Acute blood loss anemia   . Acute hypoxemic respiratory failure ([place]) 05/17/2018  . Pleural effusion 05/17/2018  . Acute congestive heart failure ([place]) 04/11/2018  . Acute respiratory failure with hypoxia ([place]) 06/21/2017  . Sarcoidosis of lung ([place]) 06/21/2017  . Hyponatremia 06/21/2017  . Congestive heart failure ([place])   . Malfunctioning jejunostomy tube ([place]) 05/15/2014  . Acute esophagitis 02/28/2014  . Diarrhea 02/28/2014  .  HOH (hard of hearing) 02/18/2014  . Severe protein-calorie malnutrition ([place]) 02/18/2014  . Jejunostomy tube present ([place]) 02/17/2014  . 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 Hyperlipidemia   . Pernicious anemia   . Hypothyroidism   . Arthritis   . SVT (supraventricular tachycardia) ([place]) 06/23/2012    Palliative Care Assessment & Plan   Patient Profile: 84 y.o. male  with past medical history of CHF, hyperlipidemia, BPH, gastric cancer, s/p recent hospitalization 5/18-5/24 for CHF (w/ L sided pleural effusion req thoracentesis) and d/c to [facility] for rehab- admitted on 05/17/2018 with hypoxia. Workup reveals large pleural effusion, large L lung mass (8cm, invading hilum, possible pericardial involvement) s/p thoracentesis 1.5 L with improvement in respiratory status. Posterior tenth and 11th rib fractures also noted on CT scan. Palliative medicine consulted for [place].     Assessment/Recommendations/Plan   Planned chemo held today  I will call his daughter and update her on his status, continue to discuss [place] with her  Discussed with Kyle Mercury, RN that patient has posterior rib fractures that have caused him a great deal of pain and limited his breathing- recommend using low dose morphine for pain as hemodynamics allow-   Increased pain can be contributing to his confusion, could be sundowning as well, would also monitor for signs of stroke given new onset A. Fib without anticoagulation and noted malignancy, ? Possible eval for brain mets  Goals of Care and Additional Recommendations:  Limitations on Scope of Treatment: Full Scope Treatment  Code Status:  DNR  Prognosis:   Unable to determine0- however, I am very concerned about him given his overall continued decline in respiratory status  Discharge Planning:  To Be Determined  Care plan was discussed with patient's RN- Kyle Armstrong. Will also call patient's daughter  and discuss.   Thank you for allowing the Palliative Medicine Team to assist in the care of this patient.   Time In: 1530 Time Out: 1605 Total Time 35 mins Prolonged Time Billed no      Greater than 50%  of this time was spent counseling and coordinating care related to the above assessment and plan.  Kyle Armstrong, [facility]   Please contact Palliative Medicine Team phone at [phone] for questions and concerns.

## 2018-05-25 NOTE — Procedures (Signed)
Ultrasound-guided therapeutic left thoracentesis performed yielding 650 cc of dark,bloody fluid. No immediate complications. Follow-up chest x-ray pending. The effusion is MULTILOCULATED and only the above amount could be aspirated today.

## 2018-05-26 DIAGNOSIS — R4182 Altered mental status, unspecified: Secondary | ICD-10-CM

## 2018-05-26 MED ORDER — MORPHINE SULFATE 2 MG/ML IJ SOLN: 2.0000 mg | INTRAMUSCULAR | Status: DC | PRN

## 2018-05-26 MED ORDER — LORAZEPAM 2 MG/ML IJ SOLN
0.5000 mg | Freq: Four times a day (QID) | INTRAMUSCULAR | Status: DC | PRN
  Administered 2018-05-26 (×2): 0.5 mg via INTRAVENOUS
  Filled 2018-05-26: qty 1

## 2018-05-26 MED ORDER — LORAZEPAM 2 MG/ML IJ SOLN
1.0000 mg | Freq: Four times a day (QID) | INTRAMUSCULAR | Status: DC | PRN
  Filled 2018-05-26: qty 1

## 2018-05-26 MED ORDER — MORPHINE SULFATE (PF) 2 MG/ML IV SOLN: 2.0000 mg | INTRAVENOUS | Status: DC | PRN

## 2018-05-26 NOTE — Discharge Summary (Signed)
Physician Discharge Summary  TAHJE BORAWSKI JKD:[id number] DOB: [birth year] DOA: 05/17/2018  PCP: Lawerance Cruel, MD  Admit date: 05/17/2018 Discharge date: 05/26/2018  Admitted From: Home Disposition: Residential Hospice   Discharge Condition: Terminal CODE STATUS: DNR   Diet recommendation: Comfort feed   Brief/Interim Summary: Kyle Armstrong is a 84 yo male with PMHx ofchronic diastolic CHF, HLD, BPH; admitted on6/24/2019, presented with complaint ofshortness of breath, was found to haverecurrent left pleural effusion secondary to left lung mass. He underwent multiple thoracentesis due to concern for malignant pleural effusion.  CT of the chest performed this admission shows large central left lung mass roughly 7 x 8 cm size contiguous with left hilum mass along the pulmonary artery probably involving pleura and pericardium along with paraesophageal, bilateral hilar subcarinal adenopathy. Oncology was consulted.  Patient underwent IR biopsy of pleural mass, preliminary results consistent with small cell lung cancer.  Oncology recommended initiating chemotherapy, however, this was deferred due to patient's altered mentation. After discussion with oncology, family decided to proceed with comfort care; he is discharged to residential hospice.   Discharge Diagnoses:  Principal Problem:   Acute hypoxemic respiratory failure (HCC) Active Problems:   Hyperlipidemia   Hypothyroidism   Pleural effusion   Lung mass   Goals of care, counseling/discussion   Advance care planning   Palliative care by specialist   S/P thoracentesis   Acute blood loss anemia   New onset a-fib (HCC)   Small cell lung cancer (HCC)   SOB (shortness of breath)   Multiple closed fractures of ribs of right side   Loculated pleural effusion  Acute on chronic hypoxic respiratory failure Recurrent left pleural effusion-hemothorax. Likely malignant Left lung cancer with mediastinal mass Acute metabolic  encephalopathy  Acute blood loss anemia New onset A. Fib Chronic diastolic CHF Dyslipidemia BPH Hypothyroidism Hypertension Chronic interstitial fibrosis of bilateral lung parenchyma with honeycombing.   Discharge Instructions   Allergies as of 05/26/2018      Reactions   Lipitor [atorvastatin] Other (See Comments)   Leg cramps   Amoxicillin Rash, Other (See Comments)   Has patient had a PCN reaction causing immediate rash, facial/tongue/throat swelling, SOB or lightheadedness with hypotension: yes Has patient had a PCN reaction causing severe rash involving mucus membranes or skin necrosis: no Has patient had a PCN reaction that required hospitalization: unknown Has patient had a PCN reaction occurring within the last 10 years: unknown If all of the above answers are "NO", then may proceed with Cephalosporin use.   Augmentin [amoxicillin-pot Clavulanate] Rash, Other (See Comments)   Has patient had a PCN reaction causing immediate rash, facial/tongue/throat swelling, SOB or lightheadedness with hypotension: yes Has patient had a PCN reaction causing severe rash involving mucus membranes or skin necrosis: no Has patient had a PCN reaction that required hospitalization: unknown Has patient had a PCN reaction occurring within the last 10 years: unknown If all of the above answers are "NO", then may proceed with Cephalosporin use.      Medication List    STOP taking these medications   acetaminophen 500 MG tablet Commonly known as:  TYLENOL   albuterol 108 (90 Base) MCG/ACT inhaler Commonly known as:  PROVENTIL HFA;VENTOLIN HFA   aspirin 81 MG chewable tablet   cyanocobalamin 1000 MCG/ML injection Commonly known as:  (VITAMIN B-12)   feeding supplement (PRO-STAT SUGAR FREE 64) Liqd   ferrous sulfate 325 (65 FE) MG EC tablet   furosemide 40 MG tablet Commonly known as:  LASIX   levothyroxine 50 MCG tablet Commonly known as:  SYNTHROID, LEVOTHROID   metoprolol  tartrate 25 MG tablet Commonly known as:  LOPRESSOR   potassium chloride SA 20 MEQ tablet Commonly known as:  K-DUR,KLOR-CON   simvastatin 40 MG tablet Commonly known as:  ZOCOR   tamsulosin 0.4 MG Caps capsule Commonly known as:  FLOMAX       Allergies  Allergen Reactions  . Lipitor [Atorvastatin] Other (See Comments)    Leg cramps  . Amoxicillin Rash and Other (See Comments)    Has patient had a PCN reaction causing immediate rash, facial/tongue/throat swelling, SOB or lightheadedness with hypotension: yes Has patient had a PCN reaction causing severe rash involving mucus membranes or skin necrosis: no Has patient had a PCN reaction that required hospitalization: unknown Has patient had a PCN reaction occurring within the last 10 years: unknown If all of the above answers are "NO", then may proceed with Cephalosporin use.   . Augmentin [Amoxicillin-Pot Clavulanate] Rash and Other (See Comments)    Has patient had a PCN reaction causing immediate rash, facial/tongue/throat swelling, SOB or lightheadedness with hypotension: yes Has patient had a PCN reaction causing severe rash involving mucus membranes or skin necrosis: no Has patient had a PCN reaction that required hospitalization: unknown Has patient had a PCN reaction occurring within the last 10 years: unknown If all of the above answers are "NO", then may proceed with Cephalosporin use.     Consultations:  Oncology  Palliative care medicine  IR  Cardiology    Procedures/Studies: Dg Chest 1 View  Result Date: 05/25/2018 CLINICAL DATA:  Left pleural effusion.  Status post thoracentesis. EXAM: CHEST  1 VIEW COMPARISON:  Chest x-rays dated 05/21/2018 and 05/20/2018, 04/10/2018 and 06/21/2017 and chest CT dated 05/18/2018 FINDINGS: The large loculated left pleural effusion appears essentially unchanged. No pneumothorax. Heart size and pulmonary vascularity are within normal limits. Chronic accentuation of the  interstitial markings at the right lung. Small right pleural effusion. Severe arthritic changes of the left glenohumeral joint. IMPRESSION: No change in the appearance of the chest since the left thoracentesis. Large loculated left pleural effusion persists. No change in the extension weighted interstitial markings and small right pleural effusion. Electronically Signed   By: Lorriane Shire M.D.   On: 05/25/2018 12:08   Dg Chest 1 View  Result Date: 05/21/2018 CLINICAL DATA:  Post thoracentesis Post thoracentesis EXAM: CHEST  1 VIEW COMPARISON:  05/20/2018, 05/18/2018 CT FINDINGS: The heart size is enlarged but difficult to assess given the significant opacity in the lung. There is a moderate loculated LEFT-sided pleural effusion. Aeration in the LEFT lung is limited to the apex, similar to prior studies. There are prominent interstitial markings in the RIGHT lung which appear stable. Chronic change is identified in the LEFT shoulder. IMPRESSION: Persistent significant loculated LEFT pleural effusion and LEFT-sided opacity. Stable appearance of the RIGHT lung. Electronically Signed   By: Nolon Nations M.D.   On: 05/21/2018 13:53   Dg Chest 1 View  Result Date: 05/17/2018 CLINICAL DATA:  Status post LEFT thoracentesis. EXAM: CHEST  1 VIEW COMPARISON:  Portable film earlier in the day. FINDINGS: Marked decrease LEFT pleural effusion. No visible pneumothorax. Cardiac enlargement, BILATERAL interstitial prominence is redemonstrated stable. Marked osseous degenerative change, both shoulders. IMPRESSION: Marked decreased LEFT pleural effusion post thoracentesis. No pneumothorax. Electronically Signed   By: Staci Righter M.D.   On: 05/17/2018 16:55   Dg Chest 2 View  Result Date: 05/17/2018  CLINICAL DATA:  Shortness of breath. History of left-sided pneumonia. EXAM: CHEST - 2 VIEW COMPARISON:  Portable chest x-ray of Apr 13, 2018 FINDINGS: There is near-total opacification of the left hemithorax. Only a small  amount of aerated lung is noted in the apex. There is slight shift of the mediastinum toward the right. The interstitial markings of the right lung are coarse and slightly more conspicuous today. There is no definite right pleural effusion. The left heart border is obscured. The right heart border appears normal. There is severe degenerative change of the left shoulder. IMPRESSION: New near-total opacification of the left hemithorax compatible with large pleural effusion and likely left lung atelectasis. The possibility of a central obstructing lesion is raised. Slight mediastinal shift toward the right. Mildly increased interstitial densities throughout the right lung may reflect edema or less likely pneumonia. Electronically Signed   By: David  Martinique M.D.   On: 05/17/2018 13:53   Ct Chest W Contrast  Result Date: 05/18/2018 CLINICAL DATA:  History of CHF, remote gastric cancer, hypoxia, recurrent bloody left effusion status post thoracentesis twice EXAM: CT CHEST WITH CONTRAST TECHNIQUE: Multidetector CT imaging of the chest was performed during intravenous contrast administration. CONTRAST:  50mL OMNIPAQUE IOHEXOL 300 MG/ML  SOLN COMPARISON:  05/17/2018, 01/25/2014 FINDINGS: Cardiovascular: Atherosclerosis of the thoracic aorta. No significant aneurysm or dissection. Three-vessel arch anatomy appearing patent. No large central pulmonary embolus or saddle embolus. Native coronary atherosclerosis noted. Aortic valve calcifications present. Heart is normal in size. No pericardial effusion. Mediastinum/Nodes: Abnormal enhancing left hilar, subcarinal, and suspect paraesophageal mediastinal adenopathy. Right hilar adenopathy measures 3.1 x 1.7 cm, image 70. No thyroid abnormality. Trachea remains patent. Central bronchi are patent. No mediastinal hemorrhage or hematoma. Enhancing nodular abnormal soft tissue along the left anterior mediastinum medially and also along the left pericardium diffusely compatible with  mediastinal/pleural and likely pericardial tumor involvement. Lungs/Pleura: Background peripheral interstitial fibrotic pattern with basilar honeycombing. Similar areas of apical nodular spiculation and central cavitation compatible with scarring when compared to 2015. Large central left lung mass has obscured margins but roughly measures 6.8 x 8 cm, image 81 series 2. Again, the lung mass appears to be contiguous with abnormal soft tissue invading the left hilum along the pulmonary arteries, left upper and lower lobe bronchi, as well as the left superior and inferior pulmonary veins. Loculated heterogeneous large left pleural effusion with diffuse pleural enhancement and pleural enhancing nodularity inferiorly. Chest wall nodularity also noted posteriorly, image 112 series 2. Constellation of findings are compatible with central bronchogenic left lung cancer invading the mediastinum with mediastinal and hilar adenopathy as well as diffuse left hemithorax pleural and chest wall metastatic disease. This would be compatible with a stage IV lung cancer. Right lung demonstrates scattered parenchymal scarring and peripheral interstitial fibrosis with basilar honeycombing. Small right pleural effusion noted dependently without loculation. Associated right basilar atelectasis. Upper Abdomen: Postop changes from partial gastrectomy. No upper abdominal adenopathy. Remote cholecystectomy. No acute upper abdominal finding. Musculoskeletal: Limited with motion artifact. Bones are osteopenic. Degenerative changes of the spine and both shoulders. Posterior right tenth and eleventh rib fractures noted. Chronic vertebral plana at T12. No acute compression fracture. Motion artifact across the manubrium and sternum. IMPRESSION: Large obscured left central lung mass measuring up to 8 cm compatible with bronchogenic lung carcinoma with evidence left hilar and mediastinal adenopathy, left hilar central invasion, possible involvement of  the left pericardium, and diffuse left hemithorax pleural metastatic nodularity and chest wall nodularity. Associated partial  loculated left effusion appearing complex which may be related to the malignant process or residual hemothorax. Chronic background pulmonary interstitial fibrosis Trace right pleural effusion and associated basilar atelectasis Posterior right tenth and eleventh rib fractures Thoracic aortic atherosclerosis and native coronary atherosclerosis These results will be called to the ordering clinician or representative by the Radiologist Assistant, and communication documented in the PACS or zVision Dashboard. Aortic Atherosclerosis (ICD10-I70.0). Electronically Signed   By: Jerilynn Mages.  Shick M.D.   On: 05/18/2018 13:22   Dg Chest Port 1 View  Result Date: 05/20/2018 CLINICAL DATA:  Shortness of breath, chronic CHF, severe kyphosis EXAM: PORTABLE CHEST 1 VIEW COMPARISON:  CT chest of 05/18/2018 and chest x-ray of 05/17/2017 FINDINGS: The left pleural effusion has reaccumulated after thoracentesis with a large left pleural effusion now present with considerable loss of volume of the left lung and mediastinal shift to the right. Coarse lung markings remain throughout the right lung. Heart size is difficult to assess. No bony abnormality is seen. IMPRESSION: Interval re-accumulation of large left pleural effusion with almost complete compression of the left lung and mediastinal shift to the right. Electronically Signed   By: Ivar Drape M.D.   On: 05/20/2018 16:19   [place] Core Biopsy (soft Tissue)  Result Date: 05/20/2018 INDICATION: 84 year old male with a history of gastric cancer and new onset shortness of breath. CT imaging demonstrates extensive pleural disease, a large left pleural effusion and left-sided lung masses. Findings are concerning for metastatic gastric cancer versus new primary lung cancer. He presents for ultrasound-guided biopsy of 1 of the posterior pleural based intercostal masses.  EXAM: Ultrasound-guided core biopsy, soft tissue MEDICATIONS: None. ANESTHESIA/SEDATION: Moderate (conscious) sedation was employed during this procedure. A total of Versed 0.5 mg and Fentanyl 25 mcg was administered intravenously. Moderate Sedation Time: 10 minutes. The patient's level of consciousness and vital signs were monitored continuously by radiology nursing throughout the procedure under my direct supervision. FLUOROSCOPY TIME:  Fluoroscopy Time: 0 minutes 0 seconds (0 mGy). COMPLICATIONS: None immediate. PROCEDURE: Informed written consent was obtained from the patient after a thorough discussion of the procedural risks, benefits and alternatives. All questions were addressed. A timeout was performed prior to the initiation of the procedure. Ultrasound was used to interrogate the left posterior intercostal spaces. Ultimately, a approximately 2.6 x 1.4 cm hypoechoic soft tissue mass was successfully identified. The overlying skin was appropriately prepped and draped in the standard sterile fashion using chlorhexidine skin prep. Local anesthesia was attained by infiltration with 1% lidocaine. A small dermatotomy was made. Under real-time sonographic guidance, multiple 18 gauge core biopsies were obtained using a [employer] [place] automated biopsy device. The biopsy specimens were placed in formalin and delivered to pathology for further analysis. Post biopsy ultrasound imaging demonstrates no evidence of immediate complication. The patient tolerated the procedure well. IMPRESSION: Successful ultrasound-guided core biopsy of left posterior pleural-based intercostal mass. Signed, Criselda Peaches, MD [facility] Electronically Signed   By: Jacqulynn Cadet M.D.   On: 05/20/2018 13:01   US Thoracentesis Asp Pleural Space W/img Guide  Result Date: 05/25/2018 INDICATION: Patient with history of CHF, gastric cancer 2014, recently diagnosed small cell  lung cancer, recurrent loculated left pleural effusion. Request made for therapeutic left thoracentesis. EXAM: ULTRASOUND GUIDED THERAPEUTIC LEFT THORACENTESIS MEDICATIONS: None COMPLICATIONS: None immediate. PROCEDURE: An ultrasound guided thoracentesis was thoroughly discussed with the patient and questions answered. The benefits, risks, alternatives and complications were also discussed. The patient understands and wishes  to proceed with the procedure. Written consent was obtained. Ultrasound was performed to localize and mark an adequate pocket of fluid in the left chest. The area was then prepped and draped in the normal sterile fashion. 1% Lidocaine was used for local anesthesia. Under ultrasound guidance a 6 Fr Safe-T-Centesis catheter was introduced. Thoracentesis was performed. The catheter was removed and a dressing applied. FINDINGS: A total of approximately 650 cc of dark, bloody fluid was removed. The effusion is multiloculated and only the above amount of fluid could be aspirated today. IMPRESSION: Successful ultrasound guided therapeutic left thoracentesis yielding 650 cc of pleural fluid. Read by: Rowe Robert, PA-C Electronically Signed   By: Marybelle Killings M.D.   On: 05/25/2018 12:18   US Thoracentesis Asp Pleural Space W/img Guide  Result Date: 05/21/2018 INDICATION: Patient with history of CHF, gastric cancer 2014, dyspnea, recurrent loculated left pleural effusion. Request made for therapeutic left thoracentesis. EXAM: ULTRASOUND GUIDED THERAPEUTIC LEFT THORACENTESIS MEDICATIONS: None COMPLICATIONS: None immediate. PROCEDURE: An ultrasound guided thoracentesis was thoroughly discussed with the patient and questions answered. The benefits, risks, alternatives and complications were also discussed. The patient understands and wishes to proceed with the procedure. Written consent was obtained. Ultrasound was performed to localize and mark an adequate pocket of fluid in the left chest. The area  was then prepped and draped in the normal sterile fashion. 1% Lidocaine was used for local anesthesia. Under ultrasound guidance a 6 Fr Safe-T-Centesis catheter was introduced. Thoracentesis was performed. The catheter was removed and a dressing applied. FINDINGS: A total of approximately 750 cc of bloody fluid was removed. The effusion is multiloculated. IMPRESSION: Successful ultrasound guided therapeutic left thoracentesis yielding 750 cc of pleural fluid. Read by: Rowe Robert, PA-C Electronically Signed   By: Corrie Mckusick D.O.   On: 05/21/2018 13:44   US Thoracentesis Asp Pleural Space W/img Guide  Result Date: 05/17/2018 INDICATION: Patient with history of CHF, gastric cancer 2014, hypoxia, recurrent left pleural effusion. Request made for diagnostic and therapeutic left thoracentesis. EXAM: ULTRASOUND GUIDED DIAGNOSTIC AND THERAPEUTIC LEFT THORACENTESIS MEDICATIONS: None COMPLICATIONS: None immediate. PROCEDURE: An ultrasound guided thoracentesis was thoroughly discussed with the patient and questions answered. The benefits, risks, alternatives and complications were also discussed. The patient understands and wishes to proceed with the procedure. Written consent was obtained. Ultrasound was performed to localize and mark an adequate pocket of fluid in the left chest. The area was then prepped and draped in the normal sterile fashion. 1% Lidocaine was used for local anesthesia. Under ultrasound guidance a 6 Fr Safe-T-Centesis catheter was introduced. Thoracentesis was performed. The catheter was removed and a dressing applied. FINDINGS: A total of approximately 1.3 liters of dark, bloody fluid was removed. Samples were sent to the laboratory as requested by the clinical team. Due to loculated nature of collection and patient's hypotension only the above amount of fluid was removed today. IMPRESSION: Successful ultrasound guided diagnostic and therapeutic left thoracentesis yielding 1.3 liters of pleural  fluid. Read by: Rowe Robert, PA-C Electronically Signed   By: Markus Daft M.D.   On: 05/17/2018 16:51      Discharge Exam: Vitals:   05/26/18 0048 05/26/18 0548  BP: 92/62 (!) 85/66  Pulse: (!) 105 (!) 119  Resp: 20 12  Temp: 97.7 F (36.5 C) (!) 97.4 F (36.3 C)  SpO2: 100% (!) 85%    General: Pt is sleeping, comfortable appearing  Cardiovascular: Tachycardic, S1/S2 +, no rubs, no gallops Respiratory: Diminished breath sounds, without respiratory  distress Abdominal: Soft, NT, ND, bowel sounds + Extremities: no edema, no cyanosis    The results of significant diagnostics from this hospitalization (including imaging, microbiology, ancillary and laboratory) are listed below for reference.     Microbiology: Recent Results (from the past 240 hour(s))  Blood Culture (routine x 2)     Status: None   Collection Time: 05/17/18 12:30 PM  Result Value Ref Range Status   Specimen Description   Final    BLOOD RIGHT ANTECUBITAL Performed at [facility] [address] [place], [place] [address]    Special Requests   Final    BOTTLES DRAWN AEROBIC AND ANAEROBIC Blood Culture adequate volume Performed at [facility] [address] [place], [place] [address]    Culture   Final    NO GROWTH 5 DAYS Performed at [facility], [place] [address], [place], [place] [address]    Report Status 05/22/2018 FINAL  Final  Blood Culture (routine x 2)     Status: None   Collection Time: 05/17/18 12:59 PM  Result Value Ref Range Status   Specimen Description   Final    BLOOD RIGHT FOREARM Performed at [facility], [place] [address] [place], [place] [address]    Special Requests   Final    BOTTLES DRAWN AEROBIC AND ANAEROBIC Blood Culture adequate volume Performed at [facility] [address], [place], [place] [address]    Culture   Final    NO GROWTH 5 DAYS Performed at [facility], [place] [address],  [place], [place] [address]    Report Status 05/22/2018 FINAL  Final  Culture, body fluid-bottle     Status: None   Collection Time: 05/17/18  4:55 PM  Result Value Ref Range Status   Specimen Description FLUID PLEURAL LEFT  Final   Special Requests BOTTLES DRAWN AEROBIC AND ANAEROBIC  Final   Culture   Final    NO GROWTH 5 DAYS Performed at [facility], [place] [address] [place], [place] [address]    Report Status 05/22/2018 FINAL  Final  Gram stain     Status: None   Collection Time: 05/17/18  4:55 PM  Result Value Ref Range Status   Specimen Description FLUID PLEURAL LEFT  Final   Special Requests NONE  Final   Gram Stain   Final    MODERATE WBC PRESENT,BOTH PMN AND MONONUCLEAR NO ORGANISMS SEEN Performed at [facility], [place] [address] [place], [place] [address]    Report Status 05/17/2018 FINAL  Final  MRSA PCR Screening     Status: None   Collection Time: 05/18/18 12:53 AM  Result Value Ref Range Status   MRSA by PCR NEGATIVE NEGATIVE Final    Comment:        The GeneXpert MRSA Assay (FDA approved for NASAL specimens only), is one component of a comprehensive MRSA colonization surveillance program. It is not intended to diagnose MRSA infection nor to guide or monitor treatment for MRSA infections. Performed at [facility], [place] [address], [place], [place] [address]      Labs: BNP (last 3 results) Recent Labs    06/21/17 0229 04/10/18 2255 04/13/18 1350  BNP 843.8* 264.2* 250.5*   Basic Metabolic Panel: Recent Labs  Lab 05/20/18 0450 05/21/18 0454 05/22/18 0445 05/23/18 0451 05/24/18 0514 05/25/18 0620  NA 140 140 141 143 141 144  K 4.3 4.2 4.4 4.4  4.1 4.4  CL 104 105 107 111 109 112*  CO2 30 28 28 28 25 26   GLUCOSE 114* 100* 81 99 103* 126*  BUN 23 23 22 21 19  25*  CREATININE 0.89 0.88 0.77 0.69 0.77 0.89  CALCIUM 8.5* 8.4* 8.3* 8.2* 8.2* 8.4*  MG 1.9  --  1.9 2.0  --   --    Liver Function Tests: Recent Labs  Lab  05/25/18 0620  AST 20  20  ALT 11  11  ALKPHOS 80  80  BILITOT 0.5  0.5  PROT 5.0*  5.1*  ALBUMIN 2.2*  2.1*   No results for input(s): LIPASE, AMYLASE in the last 168 hours. No results for input(s): AMMONIA in the last 168 hours. CBC: Recent Labs  Lab 05/21/18 0454 05/22/18 0445 05/23/18 0451 05/24/18 0514 05/25/18 0620  WBC 7.1 7.3 7.2 8.1 7.4  HGB 9.6* 10.9* 10.1* 10.0* 9.5*  HCT 31.4* 35.3* 33.2* 33.0* 32.0*  MCV 95.7 95.7 97.9 99.1 100.0  PLT 221 217 235 262 220   Cardiac Enzymes: No results for input(s): CKTOTAL, CKMB, CKMBINDEX, TROPONINI in the last 168 hours. BNP: Invalid input(s): POCBNP CBG: No results for input(s): GLUCAP in the last 168 hours. D-Dimer No results for input(s): DDIMER in the last 72 hours. Hgb A1c No results for input(s): HGBA1C in the last 72 hours. Lipid Profile No results for input(s): CHOL, HDL, LDLCALC, TRIG, CHOLHDL, LDLDIRECT in the last 72 hours. Thyroid function studies Recent Labs    05/24/18 0855  TSH 2.896   Anemia work up No results for input(s): VITAMINB12, FOLATE, FERRITIN, TIBC, IRON, RETICCTPCT in the last 72 hours. Urinalysis    Component Value Date/Time   COLORURINE YELLOW 05/17/2018 1246   APPEARANCEUR HAZY (A) 05/17/2018 1246   LABSPEC 1.021 05/17/2018 1246   PHURINE 5.0 05/17/2018 1246   GLUCOSEU NEGATIVE 05/17/2018 1246   HGBUR NEGATIVE 05/17/2018 1246   BILIRUBINUR NEGATIVE 05/17/2018 1246   KETONESUR NEGATIVE 05/17/2018 1246   PROTEINUR NEGATIVE 05/17/2018 1246   UROBILINOGEN 1.0 02/09/2014 1317   NITRITE NEGATIVE 05/17/2018 1246   LEUKOCYTESUR NEGATIVE 05/17/2018 1246   Sepsis Labs Invalid input(s): PROCALCITONIN,  WBC,  LACTICIDVEN Microbiology Recent Results (from the past 240 hour(s))  Blood Culture (routine x 2)     Status: None   Collection Time: 05/17/18 12:30 PM  Result Value Ref Range Status   Specimen Description   Final    BLOOD RIGHT ANTECUBITAL Performed at [facility], [place] [address] [place], [place] [address]    Special Requests   Final    BOTTLES DRAWN AEROBIC AND ANAEROBIC Blood Culture adequate volume Performed at [place] [address], [place], [place] [address]    Culture   Final    NO GROWTH 5 DAYS Performed at [facility], [place] [address], [place], [place] [address]    Report Status 05/22/2018 FINAL  Final  Blood Culture (routine x 2)     Status: None   Collection Time: 05/17/18 12:59 PM  Result Value Ref Range Status   Specimen Description   Final    BLOOD RIGHT FOREARM Performed at [facility], [place] [address], [place], [place] [address]    Special Requests   Final    BOTTLES DRAWN AEROBIC AND ANAEROBIC Blood Culture adequate volume Performed at [place] [address], [place],  [address]    Culture   Final    NO GROWTH  5 DAYS Performed at [facility], [place] [address] [place], [place] [address]    Report Status 05/22/2018 FINAL  Final  Culture, body fluid-bottle     Status: None   Collection Time: 05/17/18  4:55 PM  Result Value Ref Range Status   Specimen Description FLUID PLEURAL LEFT  Final   Special Requests BOTTLES DRAWN AEROBIC AND ANAEROBIC  Final   Culture   Final    NO GROWTH 5 DAYS Performed at [facility], [place] [address] [place], [place] [address]    Report Status 05/22/2018 FINAL  Final  Gram stain     Status: None   Collection Time: 05/17/18  4:55 PM  Result Value Ref Range Status   Specimen Description FLUID PLEURAL LEFT  Final   Special Requests NONE  Final   Gram Stain   Final    MODERATE WBC PRESENT,BOTH PMN AND MONONUCLEAR NO ORGANISMS SEEN Performed at [facility], [place] [address], [place], [place] [address]    Report Status 05/17/2018 FINAL  Final  MRSA PCR Screening     Status: None   Collection Time: 05/18/18 12:53 AM  Result Value Ref Range Status   MRSA by PCR NEGATIVE NEGATIVE Final     Comment:        The GeneXpert MRSA Assay (FDA approved for NASAL specimens only), is one component of a comprehensive MRSA colonization surveillance program. It is not intended to diagnose MRSA infection nor to guide or monitor treatment for MRSA infections. Performed at [facility], [place] [address], [place], [place] [address]      Patient was seen and examined on the day of discharge and was found to be in stable condition. Time coordinating discharge: 40 minutes including assessment and coordination of care, as well as examination of the patient.   SIGNED:  Dessa Phi, DO [facility] Pager [phone]  If 7PM-7AM, please contact night-coverage [email or web address] Password [facility] 05/26/2018, 11:34 AM

## 2018-05-26 NOTE — Progress Notes (Signed)
Palliative Medicine RN Note: Noted Dr Gearldine Shown conversation with family this morning. PMT NP Leonard Downing is not in today, so I called [name] to touch base; no answer, left VM.  If there is a palliative need, please call our office. Otherwise we will watch the chart and follow from a distance, as goals and discharge plan are clear now.  Kyle Skiff [name], RN, BSN, [facility] Palliative Medicine Team 05/26/2018 9:37 AM Office [phone]

## 2018-05-26 NOTE — Progress Notes (Signed)
IP PROGRESS NOTE  Subjective:  KyleArmstrong is alert.  He is confused and attempting to climb out of bed.  His son is here this morning. Objective: Vital signs in last 24 hours: Blood pressure (!) 85/66, pulse (!) 119, temperature (!) 97.4 F (36.3 C), temperature source Oral, resp. rate 12, height '5\' 7"'$  (1.702 m), weight 154 lb 1.6 oz (69.9 kg), SpO2 (!) 85 %.  Intake/Output from previous day: 07/02 0701 - 07/03 0700 In: 0  Out: 200 [Urine:200]  Physical Exam:  HEENT:no thrush Lungs: Decreased breath sounds throughout the left chest Cardiac: Irregular, tachycardia Extremities: No leg edema Neurologic: Alert, moves all extremities, confused, not oriented, follows some simple commands    Lab Results: Recent Labs    05/24/18 0514 05/25/18 0620  WBC 8.1 7.4  HGB 10.0* 9.5*  HCT 33.0* 32.0*  PLT 262 220    BMET Recent Labs    05/24/18 0514 05/25/18 0620  NA 141 144  K 4.1 4.4  CL 109 112*  CO2 25 26  GLUCOSE 103* 126*  BUN 19 25*  CREATININE 0.77 0.89  CALCIUM 8.2* 8.4*   Medications: I have reviewed the patient's current medications.  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   Initiation radiation and concurrent Xeloda 11/10/2013, completed 12/21/2013   Restaging CTs of the chest, abdomen, and pelvis on 01/25/2014 with persistent antral thickening, no evidence of metastatic disease.   Distal gastrectomy with Billroth II anastomosis 02/15/2014 (y pT1b, y pN0).  CT scans 03/14/2016 for evaluation of mid abdominal and back pain-no acute findings within the abdomen or pelvis. Postoperative changes identified compatible distal gastrectomy with gastrojejunostomy.  2. Diffuse "gastritis "change noted on endoscopies 08/19/2013 and 10/06/2013 with biopsies confirming atrophic gastritis. Low and high-grade dysplasia were noted on the biopsy 08/19/2013  3.   Anemia-likely secondary to chronic disease, malnutrition, and potentially metastatic carcinoma involving the bone marrow 4. History of anorexia/weight loss.  5. History of colon polyp.  6. History of "sarcoidosis", pulmonary fibrosis noted on the chest CT 10/27/2013.  7. History of tobacco use.  8. History of arrhythmia, status post an ablation August 2013.  9.ChronicBack pain-most likely secondary to a benign musculoskeletal condition 10.  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 Meno remains confused today.  He is more agitated.  The confusion is likely related to hospital and critical illness delirium.  He could have brain metastases, though he has no focal neurologic symptoms.  His oxygenation is improved.  He was scheduled to begin etoposide/carboplatin chemotherapy yesterday.  We held chemotherapy secondary to his altered mental status.  I discussed the prognosis with his son by telephone last night and at the bedside this morning.  The son indicates he and his sister would like to proceed with a comfort care approach.  They feel Mr. Strole quality of life is the most important goal.  I explained Mr. Mastrangelo lifespan will likely be limited to days or a few weeks.  His son understands and would like to proceed with Hospice care.  He appears to be a candidate for [employer].   Recommendations: 1.  [facility] hospice referral  to consider transfer to [place] 2.  Anxiolytics-I will add Ativan this morning      LOS: 9 days   Betsy Coder, MD   05/26/2018, 6:42 AM  He became more confused  today and was unable to sign consent for chemotherapy.  Oxygenation is better after a thoracentesis today.  We decided to hold chemotherapy today.  I discussed the situation with his son by telephone.  We will decide on chemotherapy versus hospice based on his condition on 7/3. His son and daughter will meet me in his room in the AM 7/3

## 2018-05-26 NOTE — Progress Notes (Addendum)
Daily Progress Note   Patient Name: Kyle Armstrong       Date: 05/26/2018 DOB: September 26, 1930  Age: 84 y.o. MRN#: [id number] Attending Physician: Dessa Phi, DO Primary Care Physician: Lawerance Cruel, MD Admit Date: 05/17/2018  Reason for Consultation/Follow-up: Establishing goals of care  Subjective: Chart reviewed and discussed with bedside care team.  Kyle Armstrong denies complaints.  Is confused.    No family at bedside.  ROS  Length of Stay: 9  Current Medications: Scheduled Meds:  . acetaminophen  650 mg Oral Q6H WA  . CARBOplatin  300 mg Intravenous Once  . etoposide  75 mg/m2 (Treatment Plan Recorded) Intravenous Once  . palonosetron  0.25 mg Intravenous Once    Continuous Infusions: . sodium chloride    . dexamethasone (DECADRON) IVPB [facility]      PRN Meds: alteplase, heparin lock flush, heparin lock flush, LORazepam, metoprolol tartrate, morphine injection, morphine CONCENTRATE, sodium chloride flush, sodium chloride flush  General: Sleepy but arousable, cachectic Heart: Tachycardic. No murmur appreciated. Lungs: Decreased air movement, clear Abdomen: Soft, nontender, nondistended, positive bowel sounds.  Ext: No significant edema Skin: Warm and dry  Vital Signs: BP (!) 80/58 (BP Location: Left Arm)   Pulse 100   Temp (!) 97.4 F (36.3 C) (Oral)   Resp (!) 22   Ht _0  (1.702 m)   Wt 69.9 kg (154 lb 1.6 oz)   SpO2 (!) 75%   BMI 24.14 kg/m  SpO2: SpO2: (!) 75 % O2 Device: O2 Device: Nasal Cannula O2 Flow Rate: O2 Flow Rate (L/min): 4 L/min  Intake/output summary:  No intake or output data in the 24 hours ending 05/26/18 1544 LBM: Last BM Date: 05/25/18 Baseline Weight: Weight: 62.4 kg (137 lb 8 oz) Most recent weight: Weight: 69.9 kg (154 lb 1.6  oz)       Palliative Assessment/Data: PPS: 20%      Patient Active Problem List   Diagnosis Date Noted  . SOB (shortness of breath)   . Multiple closed fractures of ribs of right side   . Loculated pleural effusion   . Small cell lung cancer ([place]) 05/24/2018  . New onset a-fib ([place]) 05/21/2018  . Lung mass   . Goals of care, counseling/discussion   . Advance care planning   . Palliative care by  specialist   . S/P thoracentesis   . Acute blood loss anemia   . Acute hypoxemic respiratory failure ([place]) 05/17/2018  . Pleural effusion 05/17/2018  . Acute congestive heart failure ([place]) 04/11/2018  . Acute respiratory failure with hypoxia ([place]) 06/21/2017  . Sarcoidosis of lung ([place]) 06/21/2017  . Hyponatremia 06/21/2017  . Congestive heart failure ([place])   . Malfunctioning jejunostomy tube ([place]) 05/15/2014  . Acute esophagitis 02/28/2014  . Diarrhea 02/28/2014  . HOH (hard of hearing) 02/18/2014  . Severe protein-calorie malnutrition ([place]) 02/18/2014  . Jejunostomy tube present ([place]) 02/17/2014  . 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 Hyperlipidemia   . Pernicious anemia   . Hypothyroidism   . Arthritis   . SVT (supraventricular tachycardia) ([place]) 06/23/2012    Palliative Care Assessment & Plan   Patient Profile: 84 y.o. male  with past medical history of CHF, hyperlipidemia, BPH, gastric cancer, s/p recent hospitalization 5/18-5/24 for CHF (w/ L sided pleural effusion req thoracentesis) and d/c to [facility] for rehab- admitted on 05/17/2018 with hypoxia. Workup reveals large pleural effusion, large L lung mass (8cm, invading hilum, possible pericardial involvement) s/p thoracentesis 1.5 L with improvement in respiratory status. Posterior tenth and 11th rib fractures also noted on CT scan. Palliative medicine consulted for [name].     Assessment/Recommendations/Plan   Family has met and discussed with Dr.  Benay Spice.  Plan moving forward is for full comfort care with transition to [facility] for EOL care.  Appears comfortable on current regimen and stable to transfer to residential hospice.  Goals of Care and Additional Recommendations:  Limitations on Scope of Treatment: Full Comfort Care  Code Status:  DNR  Prognosis:  < 2 weeks   Discharge Planning:  Hospice facility   Thank you for allowing the Palliative Medicine Team to assist in the care of this patient.   Total Time 20 mins Prolonged Time Billed no      Greater than 50%  of this time was spent counseling and coordinating care related to the above assessment and plan. Micheline Rough, MD [place] Team [phone]    Please contact Palliative Medicine Team phone at [phone] for questions and concerns.

## 2018-05-26 NOTE — Clinical Social Work Note (Addendum)
Clinical Social Work Assessment  Patient Details  Name: Kyle Armstrong MRN: [id number] Date of Birth: [birth year]  Date of referral:  05/18/18               Reason for consult:  End of Life/Hospice, Discharge Planning                Permission sought to share information with:  Facility Sport and exercise psychologist, Family Supports Permission granted to share information::     Name::     Kyle Armstrong/ Kyle Armstrong::   Residential Hospice  Relationship::   Adult Children  Contact Information:  [phone] cell/ [phone] cell  Housing/Transportation Living arrangements for the past 2 months:  Single Family Home, Skilled Nursing Facility(Patient has been at [facility] for [name] rehab) Source of Information:  Adult Children Patient Interpreter Needed:  None Criminal Activity/Legal Involvement Pertinent to Current Situation/Hospitalization:  No - Comment as needed Significant Relationships:  Adult Children Lives with:  Self Do you feel safe going back to the place where you live?  No(Pending evaluation from PT) Need for family participation in patient care:  Yes (Comment)  Care giving concerns:   [place].   Social Worker assessment / plan: CSW attempted to call patient son Kyle Armstrong, left voicemail. CSW called patient daughter Kyle Armstrong to discuss patient transition to comfort care. Patient daughter reports Kyle Armstrong is at work and probably cannot get to his phone. She reports, " I was waiting to hear from him(Kyle) this morning, he was suppose to meet with the cancer doctor this morning to discuss my dads care."She reports she did not know her father was transitioning to comfort care. She tearfully expressed, " I will do what is best for my dad."  CSW explain residential hospice process and discussed the list of facilities in the area and surrounding area. She reports the family will probably prefer a place in or close to [place] where the patient family resides.   She wrote down the facilities and requested to discuss these options with her brother.   She updated CSW, they are both agreeable to [place] Armstrong. CSW called referral into HPOG liaison Kyle Armstrong. She will follow up with patient daughter Kyle Armstrong.  CSW will continue to follow for discharge needs.   Plan: Residential Hospice.   Employment status:  Retired Nurse, adult PT Recommendations:  Not assessed at this time Information / Referral to community resources:  Other (Comment Required)(Residential Hospice Facility )  Patient/Family's Response to care:  Agreeable and Responding well to care.   Patient/Family's Understanding of and Emotional Response to Diagnosis, Current Treatment, and Prognosis:  Patient daughter tearful after learning about the patient prognosis. Patient daughter and son are both agreeable to comfort care and Hospice services.   Emotional Assessment Appearance:  Appears stated age Attitude/Demeanor/Rapport:  (Cooperative) Affect (typically observed):    Orientation:    Alcohol / Substance use:  Not Applicable Psych involvement (Current and /or in the community):  No (Comment)  Discharge Needs  Concerns to be addressed:  Discharge Planning Concerns, Grief and Loss Concerns Readmission within the last 30 days:  Yes Current discharge risk:  Terminally ill Barriers to Discharge:  Other   Kyle Hopping, LCSW 05/26/2018, 9:56 AM

## 2018-05-26 NOTE — Progress Notes (Signed)
OT Cancellation Note  Patient Details Name: QUENTEN NAWAZ MRN: [id number] DOB: 07-May-1930   Noted DC to residential hospice  Will sign off  Kari Baars, [place] Payton Mccallum D 05/26/2018, 12:27 PM

## 2018-05-26 NOTE — Progress Notes (Signed)
NS & DS concerned about patient safety during Pt rounding; paged attending & onc consult on floor aware.

## 2018-05-26 NOTE — Progress Notes (Signed)
[facility] RN visit note.  Received request from Kathrin Greathouse, [place] for family interest in [facility] with request for transfer today. Chart reviewed and patient approved for admission to [facility]. Met with daughter, Yves Dill to confirm interest and explain services. Family agreeable to transfer today. CSW aware.  Registration paper work completed.. Dr. Orpah Melter to assume care per family request. Please fax discharge summary to [phone]. RN please call report to [phone]. Please arrange transport for patient to arrive as soon as possible.  Thank you,   Farrel Gordon, RN, [facility] Liaison  [place] are on AMION

## 2018-05-26 NOTE — Progress Notes (Signed)
Per [facility]PO[facility] Audrea Muscat can transfer at this time.  D/C summary sent.  Nurse call report to [phone]. PTAR arranged for transport.   Kathrin Greathouse, Marlinda Mike, MSW Clinical Social Worker  [phone] 05/26/2018  1:13 PM

## 2018-05-26 NOTE — Progress Notes (Signed)
Pt agitated, attempting to get out of bed. Doctor notified, awaiting orders.

## 2018-06-24 DEATH — deceased

## 2018-09-07 ENCOUNTER — Ambulatory Visit: Payer: Medicare HMO | Admitting: Oncology

## 2018-09-07 ENCOUNTER — Other Ambulatory Visit: Payer: Medicare HMO

## 2020-04-09 IMAGING — DX DG CHEST 1V
1 series · 1 of 1 positions shown · non-contrast
Comparison: 04/10/2018

CLINICAL DATA: Thoracentesis on the left

EXAM:
CHEST  1 VIEW

[chest ap]
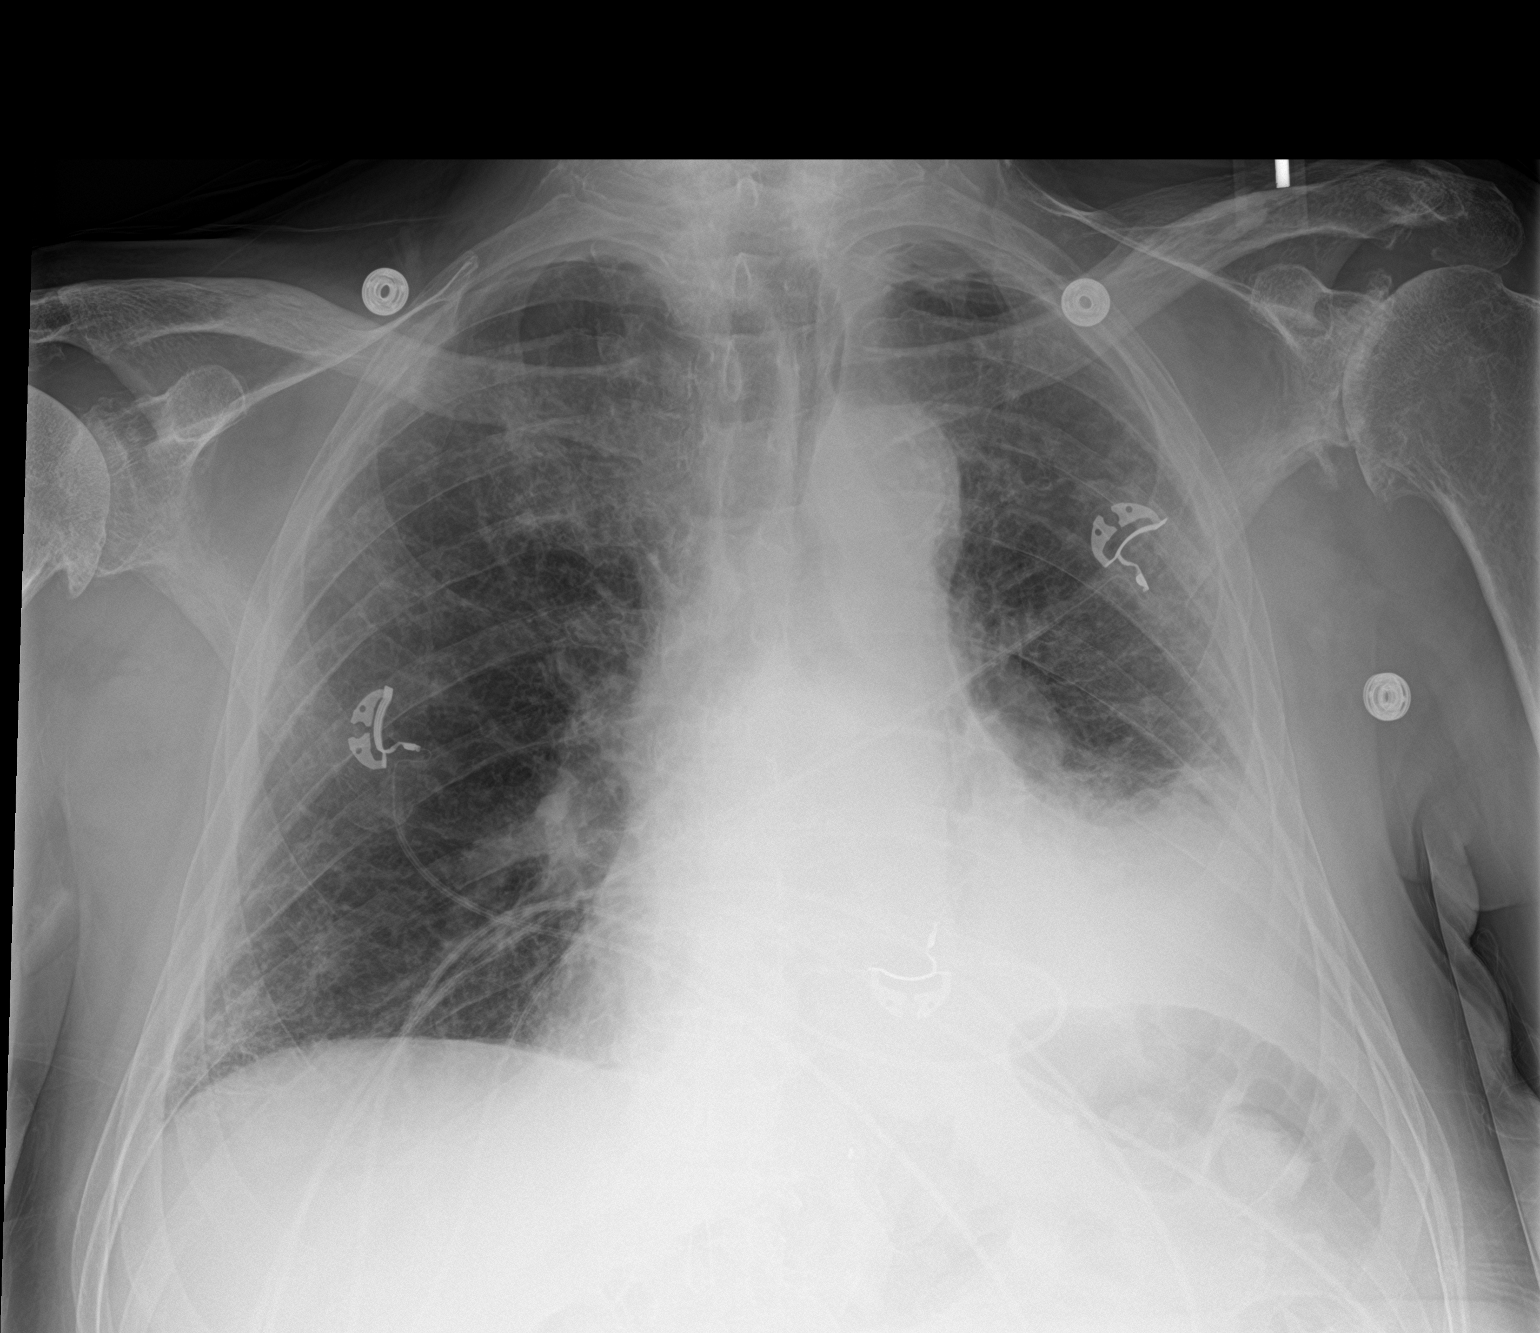

[1 of 1 positions shown; findings below may reference images not displayed]

FINDINGS: Significantly decreased left pleural effusion. No pneumothorax or
visible re-expansion edema.

Similar appearance of bilateral interstitial opacity.  Cardiomegaly.
IMPRESSION: No acute finding after left thoracentesis. Residual pleural fluid is
small volume when accounting for volume loss at the left base.
